# Patient Record
Sex: Male | Born: 1945
Health system: Southern US, Community
[De-identification: ages and names within clinical notes are randomized; demographics above are authoritative.]

## PROBLEM LIST (undated history)

## (undated) DIAGNOSIS — K219 Gastro-esophageal reflux disease without esophagitis: Secondary | ICD-10-CM

## (undated) DIAGNOSIS — R0600 Dyspnea, unspecified: Secondary | ICD-10-CM

## (undated) DIAGNOSIS — E785 Hyperlipidemia, unspecified: Secondary | ICD-10-CM

## (undated) DIAGNOSIS — I716 Thoracoabdominal aortic aneurysm, without rupture, unspecified: Secondary | ICD-10-CM

## (undated) DIAGNOSIS — M1990 Unspecified osteoarthritis, unspecified site: Secondary | ICD-10-CM

## (undated) DIAGNOSIS — I10 Essential (primary) hypertension: Secondary | ICD-10-CM

## (undated) DIAGNOSIS — Z8719 Personal history of other diseases of the digestive system: Secondary | ICD-10-CM

## (undated) DIAGNOSIS — F419 Anxiety disorder, unspecified: Secondary | ICD-10-CM

## (undated) DIAGNOSIS — J449 Chronic obstructive pulmonary disease, unspecified: Secondary | ICD-10-CM

## (undated) DIAGNOSIS — I499 Cardiac arrhythmia, unspecified: Secondary | ICD-10-CM

## (undated) DIAGNOSIS — I2510 Atherosclerotic heart disease of native coronary artery without angina pectoris: Secondary | ICD-10-CM

## (undated) DIAGNOSIS — G709 Myoneural disorder, unspecified: Secondary | ICD-10-CM

## (undated) HISTORY — PX: KNEE ARTHROSCOPY: SHX127

## (undated) HISTORY — PX: FINGER ARTHROPLASTY: SHX5017

## (undated) HISTORY — DX: Essential (primary) hypertension: I10

## (undated) HISTORY — PX: TONSILLECTOMY: SUR1361

## (undated) HISTORY — DX: Hyperlipidemia, unspecified: E78.5

## (undated) HISTORY — PX: VASECTOMY: SHX75

## (undated) HISTORY — PX: VASCULAR SURGERY: SHX849

## (undated) HISTORY — PX: OTHER SURGICAL HISTORY: SHX169

## (undated) HISTORY — PX: TOTAL SHOULDER REPLACEMENT: SUR1217

## (undated) HISTORY — PX: KNEE SURGERY: SHX244

## (undated) HISTORY — PX: EYE SURGERY: SHX253

## (undated) HISTORY — PX: SHOULDER ARTHROSCOPY: SHX128

---

## 1998-01-07 ENCOUNTER — Ambulatory Visit (HOSPITAL_COMMUNITY): Admission: RE | Admit: 1998-01-07 | Discharge: 1998-01-07 | Payer: Self-pay | Admitting: Specialist

## 1999-01-07 ENCOUNTER — Ambulatory Visit (HOSPITAL_COMMUNITY): Admission: RE | Admit: 1999-01-07 | Discharge: 1999-01-07 | Payer: Self-pay | Admitting: Specialist

## 1999-01-07 ENCOUNTER — Encounter: Payer: Self-pay | Admitting: Specialist

## 2000-07-20 ENCOUNTER — Inpatient Hospital Stay (HOSPITAL_COMMUNITY): Admission: RE | Admit: 2000-07-20 | Discharge: 2000-07-22 | Payer: Self-pay | Admitting: Specialist

## 2000-07-20 ENCOUNTER — Encounter: Payer: Self-pay | Admitting: Specialist

## 2002-11-12 ENCOUNTER — Encounter: Payer: Self-pay | Admitting: Specialist

## 2002-11-13 ENCOUNTER — Ambulatory Visit (HOSPITAL_COMMUNITY): Admission: RE | Admit: 2002-11-13 | Discharge: 2002-11-13 | Payer: Self-pay | Admitting: Specialist

## 2003-09-12 ENCOUNTER — Ambulatory Visit (HOSPITAL_COMMUNITY): Admission: RE | Admit: 2003-09-12 | Discharge: 2003-09-12 | Payer: Self-pay | Admitting: Specialist

## 2013-01-06 DIAGNOSIS — R109 Unspecified abdominal pain: Secondary | ICD-10-CM | POA: Insufficient documentation

## 2013-01-06 DIAGNOSIS — J209 Acute bronchitis, unspecified: Secondary | ICD-10-CM | POA: Diagnosis present

## 2013-01-06 DIAGNOSIS — R141 Gas pain: Secondary | ICD-10-CM | POA: Insufficient documentation

## 2013-01-06 HISTORY — DX: Unspecified abdominal pain: R10.9

## 2013-01-06 HISTORY — DX: Acute bronchitis, unspecified: J20.9

## 2013-02-19 DIAGNOSIS — R7309 Other abnormal glucose: Secondary | ICD-10-CM | POA: Insufficient documentation

## 2013-02-19 HISTORY — DX: Other abnormal glucose: R73.09

## 2013-04-19 DIAGNOSIS — R5381 Other malaise: Secondary | ICD-10-CM

## 2013-04-19 HISTORY — DX: Other malaise: R53.81

## 2013-08-23 DIAGNOSIS — M25519 Pain in unspecified shoulder: Secondary | ICD-10-CM | POA: Insufficient documentation

## 2013-08-23 DIAGNOSIS — N529 Male erectile dysfunction, unspecified: Secondary | ICD-10-CM

## 2013-08-23 HISTORY — DX: Male erectile dysfunction, unspecified: N52.9

## 2013-08-23 HISTORY — DX: Pain in unspecified shoulder: M25.519

## 2013-10-06 DIAGNOSIS — L509 Urticaria, unspecified: Secondary | ICD-10-CM | POA: Insufficient documentation

## 2013-10-06 HISTORY — DX: Urticaria, unspecified: L50.9

## 2013-11-05 DIAGNOSIS — I10 Essential (primary) hypertension: Secondary | ICD-10-CM

## 2013-11-05 DIAGNOSIS — Z0000 Encounter for general adult medical examination without abnormal findings: Secondary | ICD-10-CM

## 2013-11-05 DIAGNOSIS — J309 Allergic rhinitis, unspecified: Secondary | ICD-10-CM

## 2013-11-05 DIAGNOSIS — F411 Generalized anxiety disorder: Secondary | ICD-10-CM | POA: Insufficient documentation

## 2013-11-05 DIAGNOSIS — E7800 Pure hypercholesterolemia, unspecified: Secondary | ICD-10-CM | POA: Insufficient documentation

## 2013-11-05 HISTORY — DX: Essential (primary) hypertension: I10

## 2013-11-05 HISTORY — DX: Pure hypercholesterolemia, unspecified: E78.00

## 2013-11-05 HISTORY — DX: Generalized anxiety disorder: F41.1

## 2013-11-05 HISTORY — DX: Encounter for general adult medical examination without abnormal findings: Z00.00

## 2013-11-05 HISTORY — DX: Allergic rhinitis, unspecified: J30.9

## 2014-05-17 DIAGNOSIS — K222 Esophageal obstruction: Secondary | ICD-10-CM

## 2014-05-17 HISTORY — DX: Esophageal obstruction: K22.2

## 2014-08-14 DIAGNOSIS — J449 Chronic obstructive pulmonary disease, unspecified: Secondary | ICD-10-CM

## 2014-08-14 HISTORY — DX: Chronic obstructive pulmonary disease, unspecified: J44.9

## 2015-07-09 ENCOUNTER — Emergency Department (HOSPITAL_COMMUNITY)
Admission: EM | Admit: 2015-07-09 | Discharge: 2015-07-09 | Discharge status: Against Medical Advice | Payer: Medicare Other | Source: Home / Self Care

## 2015-07-09 ENCOUNTER — Encounter (HOSPITAL_COMMUNITY): Payer: Self-pay

## 2015-07-09 DIAGNOSIS — R52 Pain, unspecified: Secondary | ICD-10-CM

## 2015-07-09 DIAGNOSIS — J449 Chronic obstructive pulmonary disease, unspecified: Secondary | ICD-10-CM | POA: Insufficient documentation

## 2015-07-09 DIAGNOSIS — R05 Cough: Secondary | ICD-10-CM | POA: Diagnosis not present

## 2015-07-09 DIAGNOSIS — R197 Diarrhea, unspecified: Secondary | ICD-10-CM | POA: Insufficient documentation

## 2015-07-09 DIAGNOSIS — F17200 Nicotine dependence, unspecified, uncomplicated: Secondary | ICD-10-CM | POA: Insufficient documentation

## 2015-07-09 DIAGNOSIS — J441 Chronic obstructive pulmonary disease with (acute) exacerbation: Secondary | ICD-10-CM | POA: Diagnosis not present

## 2015-07-09 HISTORY — DX: Gastro-esophageal reflux disease without esophagitis: K21.9

## 2015-07-09 HISTORY — DX: Chronic obstructive pulmonary disease, unspecified: J44.9

## 2015-07-09 LAB — BASIC METABOLIC PANEL
Anion gap: 12 (ref 5–15)
BUN: 13 mg/dL (ref 6–20)
CO2: 28 mmol/L (ref 22–32)
Calcium: 9.1 mg/dL (ref 8.9–10.3)
Chloride: 101 mmol/L (ref 101–111)
Creatinine, Ser: 0.7 mg/dL (ref 0.61–1.24)
GFR calc Af Amer: >60 mL/min (ref >60)
GFR calc non Af Amer: >60 mL/min (ref >60)
Glucose, Bld: 105 mg/dL — ABNORMAL HIGH (ref 65–99)
Potassium: 4 mmol/L (ref 3.5–5.1)
Sodium: 141 mmol/L (ref 135–145)

## 2015-07-09 LAB — CBC WITH DIFFERENTIAL/PLATELET
Basophils Absolute: 0 10*3/uL (ref 0.0–0.1)
Basophils Relative: 0 %
Eosinophils Absolute: 0.2 10*3/uL (ref 0.0–0.7)
Eosinophils Relative: 3 %
HCT: 48.6 % (ref 39.0–52.0)
Hemoglobin: 17.1 g/dL — ABNORMAL HIGH (ref 13.0–17.0)
Lymphocytes Relative: 15 %
Lymphs Abs: 1.1 10*3/uL (ref 0.7–4.0)
MCH: 37 pg — ABNORMAL HIGH (ref 26.0–34.0)
MCHC: 35.2 g/dL (ref 30.0–36.0)
MCV: 105.2 fL — ABNORMAL HIGH (ref 78.0–100.0)
Monocytes Absolute: 0.9 10*3/uL (ref 0.1–1.0)
Monocytes Relative: 12 %
Neutro Abs: 5.3 10*3/uL (ref 1.7–7.7)
Neutrophils Relative %: 70 %
Platelets: 139 10*3/uL — ABNORMAL LOW (ref 150–400)
RBC: 4.62 MIL/uL (ref 4.22–5.81)
RDW: 14.4 % (ref 11.5–15.5)
WBC: 7.6 10*3/uL (ref 4.0–10.5)

## 2015-07-09 NOTE — ED Notes (Signed)
Pt reports was treated with antibiotics 1 month ago for bronchitis.  Reports diarrhea x 2 weeks after taking antibiotics.  Pt says has been taking immodium and it helped some.  Pt says has been on a liquid diet for the past few days.  Pt c/o feeling pressure in his rectum.  Pt c/o feeling achy all over.

## 2015-07-09 NOTE — ED Notes (Signed)
Registration states pt left

## 2015-07-10 ENCOUNTER — Encounter (HOSPITAL_COMMUNITY): Payer: Self-pay | Admitting: General Practice

## 2015-07-10 ENCOUNTER — Inpatient Hospital Stay (HOSPITAL_COMMUNITY): Payer: Medicare Other

## 2015-07-10 ENCOUNTER — Inpatient Hospital Stay (HOSPITAL_COMMUNITY)
Admission: AD | Admit: 2015-07-10 | Discharge: 2015-07-17 | DRG: 191 | Disposition: A | Discharge status: Home / Self Care | Payer: Medicare Other | Source: Ambulatory Visit | Attending: Pulmonary Disease | Admitting: Pulmonary Disease

## 2015-07-10 DIAGNOSIS — J441 Chronic obstructive pulmonary disease with (acute) exacerbation: Secondary | ICD-10-CM | POA: Diagnosis not present

## 2015-07-10 DIAGNOSIS — E440 Moderate protein-calorie malnutrition: Secondary | ICD-10-CM | POA: Diagnosis not present

## 2015-07-10 DIAGNOSIS — E860 Dehydration: Secondary | ICD-10-CM | POA: Diagnosis present

## 2015-07-10 DIAGNOSIS — Z6829 Body mass index (BMI) 29.0-29.9, adult: Secondary | ICD-10-CM

## 2015-07-10 DIAGNOSIS — F10231 Alcohol dependence with withdrawal delirium: Secondary | ICD-10-CM | POA: Diagnosis present

## 2015-07-10 DIAGNOSIS — R05 Cough: Secondary | ICD-10-CM | POA: Diagnosis present

## 2015-07-10 DIAGNOSIS — K219 Gastro-esophageal reflux disease without esophagitis: Secondary | ICD-10-CM | POA: Diagnosis present

## 2015-07-10 DIAGNOSIS — J209 Acute bronchitis, unspecified: Secondary | ICD-10-CM

## 2015-07-10 DIAGNOSIS — F17210 Nicotine dependence, cigarettes, uncomplicated: Secondary | ICD-10-CM | POA: Diagnosis present

## 2015-07-10 DIAGNOSIS — F1020 Alcohol dependence, uncomplicated: Secondary | ICD-10-CM | POA: Diagnosis present

## 2015-07-10 DIAGNOSIS — J440 Chronic obstructive pulmonary disease with acute lower respiratory infection: Secondary | ICD-10-CM | POA: Diagnosis present

## 2015-07-10 DIAGNOSIS — F10931 Alcohol use, unspecified with withdrawal delirium: Secondary | ICD-10-CM | POA: Diagnosis present

## 2015-07-10 LAB — COMPREHENSIVE METABOLIC PANEL
ALBUMIN: 4.1 g/dL (ref 3.5–5.0)
ALT: 65 U/L — ABNORMAL HIGH (ref 17–63)
ANION GAP: 12 (ref 5–15)
AST: 75 U/L — ABNORMAL HIGH (ref 15–41)
Alkaline Phosphatase: 90 U/L (ref 38–126)
BUN: 13 mg/dL (ref 6–20)
CALCIUM: 8.9 mg/dL (ref 8.9–10.3)
CHLORIDE: 101 mmol/L (ref 101–111)
CO2: 28 mmol/L (ref 22–32)
Creatinine, Ser: 0.68 mg/dL (ref 0.61–1.24)
GFR calc non Af Amer: >60 mL/min (ref >60)
GLUCOSE: 107 mg/dL — AB (ref 65–99)
POTASSIUM: 3.5 mmol/L (ref 3.5–5.1)
SODIUM: 141 mmol/L (ref 135–145)
Total Bilirubin: 1 mg/dL (ref 0.3–1.2)
Total Protein: 7.6 g/dL (ref 6.5–8.1)

## 2015-07-10 LAB — CBC WITH DIFFERENTIAL/PLATELET
BASOS PCT: 1 %
Basophils Absolute: 0.1 10*3/uL (ref 0.0–0.1)
Eosinophils Absolute: 0.2 10*3/uL (ref 0.0–0.7)
Eosinophils Relative: 3 %
HEMATOCRIT: 47.8 % (ref 39.0–52.0)
HEMOGLOBIN: 16.8 g/dL (ref 13.0–17.0)
LYMPHS ABS: 1.1 10*3/uL (ref 0.7–4.0)
LYMPHS PCT: 19 %
MCH: 37 pg — ABNORMAL HIGH (ref 26.0–34.0)
MCHC: 35.1 g/dL (ref 30.0–36.0)
MCV: 105.3 fL — AB (ref 78.0–100.0)
MONO ABS: 0.8 10*3/uL (ref 0.1–1.0)
MONOS PCT: 14 %
NEUTROS ABS: 3.7 10*3/uL (ref 1.7–7.7)
NEUTROS PCT: 63 %
Platelets: 135 10*3/uL — ABNORMAL LOW (ref 150–400)
RBC: 4.54 MIL/uL (ref 4.22–5.81)
RDW: 14.3 % (ref 11.5–15.5)
WBC: 5.8 10*3/uL (ref 4.0–10.5)

## 2015-07-10 LAB — PROTIME-INR
INR: 0.93 (ref 0.00–1.49)
PROTHROMBIN TIME: 12.7 s (ref 11.6–15.2)

## 2015-07-10 MED ORDER — ALBUTEROL SULFATE (2.5 MG/3ML) 0.083% IN NEBU: 2.5000 mg | INHALATION_SOLUTION | Freq: Four times a day (QID) | RESPIRATORY_TRACT | Status: DC

## 2015-07-10 MED ORDER — ONDANSETRON HCL 4 MG PO TABS: 4.0000 mg | ORAL_TABLET | Freq: Four times a day (QID) | ORAL | Status: DC | PRN

## 2015-07-10 MED ORDER — SODIUM CHLORIDE 0.9 % IJ SOLN
3.0000 mL | Freq: Two times a day (BID) | INTRAMUSCULAR | Status: DC
  Administered 2015-07-11 – 2015-07-17 (×8): 3 mL via INTRAVENOUS

## 2015-07-10 MED ORDER — GUAIFENESIN ER 600 MG PO TB12
600.0000 mg | ORAL_TABLET | Freq: Two times a day (BID) | ORAL | Status: DC
  Administered 2015-07-10 – 2015-07-17 (×12): 600 mg via ORAL
  Filled 2015-07-10 (×12): qty 1

## 2015-07-10 MED ORDER — ACETAMINOPHEN 650 MG RE SUPP: 650.0000 mg | Freq: Four times a day (QID) | RECTAL | Status: DC | PRN

## 2015-07-10 MED ORDER — IPRATROPIUM-ALBUTEROL 0.5-2.5 (3) MG/3ML IN SOLN
3.0000 mL | Freq: Four times a day (QID) | RESPIRATORY_TRACT | Status: DC
  Administered 2015-07-10 (×2): 3 mL via RESPIRATORY_TRACT
  Filled 2015-07-10 (×2): qty 3

## 2015-07-10 MED ORDER — IPRATROPIUM-ALBUTEROL 0.5-2.5 (3) MG/3ML IN SOLN
3.0000 mL | Freq: Four times a day (QID) | RESPIRATORY_TRACT | Status: DC
  Administered 2015-07-11 – 2015-07-14 (×9): 3 mL via RESPIRATORY_TRACT
  Filled 2015-07-10 (×12): qty 3

## 2015-07-10 MED ORDER — ACETAMINOPHEN 325 MG PO TABS: 650.0000 mg | ORAL_TABLET | Freq: Four times a day (QID) | ORAL | Status: DC | PRN

## 2015-07-10 MED ORDER — LORAZEPAM 0.5 MG PO TABS
1.0000 mg | ORAL_TABLET | Freq: Four times a day (QID) | ORAL | Status: DC | PRN
  Administered 2015-07-10 – 2015-07-12 (×7): 1 mg via ORAL
  Filled 2015-07-10 (×7): qty 1

## 2015-07-10 MED ORDER — THIAMINE HCL 100 MG/ML IJ SOLN
INTRAVENOUS | Status: DC
  Filled 2015-07-10 (×4): qty 1000

## 2015-07-10 MED ORDER — LORAZEPAM 2 MG/ML IJ SOLN
1.0000 mg | Freq: Four times a day (QID) | INTRAMUSCULAR | Status: DC | PRN
  Filled 2015-07-10: qty 1

## 2015-07-10 MED ORDER — IPRATROPIUM BROMIDE 0.02 % IN SOLN: 0.5000 mg | Freq: Four times a day (QID) | RESPIRATORY_TRACT | Status: DC

## 2015-07-10 MED ORDER — ONDANSETRON HCL 4 MG/2ML IJ SOLN: 4.0000 mg | Freq: Four times a day (QID) | INTRAMUSCULAR | Status: DC | PRN

## 2015-07-10 MED ORDER — LORAZEPAM 2 MG/ML IJ SOLN
1.0000 mg | Freq: Once | INTRAMUSCULAR | Status: AC
  Administered 2015-07-10: 1 mg via INTRAVENOUS
  Filled 2015-07-10: qty 1

## 2015-07-10 MED ORDER — ALUM & MAG HYDROXIDE-SIMETH 200-200-20 MG/5ML PO SUSP: 30.0000 mL | Freq: Four times a day (QID) | ORAL | Status: DC | PRN

## 2015-07-10 MED ORDER — ALBUTEROL SULFATE (2.5 MG/3ML) 0.083% IN NEBU: 2.5000 mg | INHALATION_SOLUTION | RESPIRATORY_TRACT | Status: DC | PRN

## 2015-07-10 MED ORDER — SODIUM CHLORIDE 0.9 % IV SOLN
INTRAVENOUS | Status: DC
  Administered 2015-07-10 – 2015-07-11 (×2): via INTRAVENOUS
  Administered 2015-07-11: 1 mL via INTRAVENOUS
  Administered 2015-07-12 – 2015-07-16 (×7): via INTRAVENOUS

## 2015-07-10 MED ORDER — HYDRALAZINE HCL 25 MG PO TABS
25.0000 mg | ORAL_TABLET | Freq: Four times a day (QID) | ORAL | Status: DC | PRN
  Administered 2015-07-10 – 2015-07-14 (×4): 25 mg via ORAL
  Filled 2015-07-10 (×5): qty 1

## 2015-07-10 MED ORDER — LEVOFLOXACIN IN D5W 500 MG/100ML IV SOLN
500.0000 mg | INTRAVENOUS | Status: DC
  Administered 2015-07-10 – 2015-07-16 (×6): 500 mg via INTRAVENOUS
  Filled 2015-07-10 (×6): qty 100

## 2015-07-10 MED ORDER — HYDROCODONE-ACETAMINOPHEN 5-325 MG PO TABS
1.0000 | ORAL_TABLET | ORAL | Status: DC | PRN
  Administered 2015-07-10: 2 via ORAL
  Filled 2015-07-10: qty 2

## 2015-07-10 MED ORDER — ENOXAPARIN SODIUM 40 MG/0.4ML ~~LOC~~ SOLN
40.0000 mg | SUBCUTANEOUS | Status: DC
  Administered 2015-07-10 – 2015-07-16 (×7): 40 mg via SUBCUTANEOUS
  Filled 2015-07-10 (×7): qty 0.4

## 2015-07-10 MED ORDER — M.V.I. ADULT IV INJ
INJECTION | Freq: Every day | INTRAVENOUS | Status: DC
  Administered 2015-07-10 – 2015-07-13 (×4): via INTRAVENOUS
  Filled 2015-07-10 (×7): qty 1000

## 2015-07-10 NOTE — Progress Notes (Signed)
Patient is tachycardic, hypertensive, and agitated. He has tremors and is sweaty. He is an 8 no the CIWA scale but he is not due to get ativan again for several hours. Contacted Dr. Anastasio Champion and received an order for a  one time dose of ativan. Will administer and continue to monitor closely.

## 2015-07-10 NOTE — Progress Notes (Addendum)
Direct admit patient arrived to room 301.  Dr. Luan Pulling paged.  Dr. Luan Pulling returned page and stated will be placing orders.

## 2015-07-10 NOTE — Progress Notes (Signed)
Patient's blood pressure elevated (see flowsheet).  Dr. Anastasio Champion notified and received order for hydralazine 25 mg po every 6 hours as needed for systolic greater than 756.

## 2015-07-11 LAB — CBC
HCT: 48.8 % (ref 39.0–52.0)
Hemoglobin: 16.9 g/dL (ref 13.0–17.0)
MCH: 36.3 pg — ABNORMAL HIGH (ref 26.0–34.0)
MCHC: 34.6 g/dL (ref 30.0–36.0)
MCV: 104.9 fL — ABNORMAL HIGH (ref 78.0–100.0)
Platelets: 128 K/uL — ABNORMAL LOW (ref 150–400)
RBC: 4.65 MIL/uL (ref 4.22–5.81)
RDW: 14.3 % (ref 11.5–15.5)
WBC: 7.9 K/uL (ref 4.0–10.5)

## 2015-07-11 LAB — BASIC METABOLIC PANEL
ANION GAP: 10 (ref 5–15)
BUN: 9 mg/dL (ref 6–20)
CO2: 29 mmol/L (ref 22–32)
Calcium: 9.3 mg/dL (ref 8.9–10.3)
Chloride: 100 mmol/L — ABNORMAL LOW (ref 101–111)
Creatinine, Ser: 0.66 mg/dL (ref 0.61–1.24)
Glucose, Bld: 101 mg/dL — ABNORMAL HIGH (ref 65–99)
POTASSIUM: 3.6 mmol/L (ref 3.5–5.1)
SODIUM: 139 mmol/L (ref 135–145)

## 2015-07-11 LAB — C DIFFICILE QUICK SCREEN W PCR REFLEX
C Diff antigen: NEGATIVE
C Diff interpretation: NEGATIVE
C Diff toxin: NEGATIVE

## 2015-07-11 NOTE — Progress Notes (Signed)
C-Diff negative.  Enteric precautions removed.

## 2015-07-11 NOTE — Progress Notes (Signed)
Initial Nutrition Assessment  DOCUMENTATION CODES:   Non-severe (moderate) malnutrition in context of acute illness/injury  INTERVENTION:  Boost Breeze po TID, each supplement provides 250 kcal and 9 grams of protein (prefers berry flavor)  Attempted to provide general healthy choices at fast food restaurants  NUTRITION DIAGNOSIS:   Inadequate oral intake related to acute illness as evidenced by per patient/family report.   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 He doesn't eat breafast but drinks Pedilyte and then goes eats at somewhere like [employer] for lunch around 11:30. Usual choice is a Roast Beef with cheese and water. In the evening he'll stop at [place] or [place] place for dinner. He also has hx of ETOH abuse which is contributing to overall poor nutrition status chronically. He says he has been sick for the past week with bronchitis and has not been eating but mostly drinking 1 quart of Pedilyte and 1 Ensure daily. Also had diarrhea which is better per pt. His weight is down 4% in the past week and with his poor po intake he meets criteria for moderate malnutrition in the context of acute illness. Mild temporal muscle loss.  EDUCATION NEEDS: Offered to provide pt with list of healthy choices specific to common [place] since that is his most frequent source for daily nutrition but he says, "I'm not interested".  Diet Order:  Diet clear liquid Room service appropriate?: Yes; Fluid consistency:: Thin  Skin:   dry, flaky and intact  Last BM:   12/9  Height:   Ht Readings from Last 1 Encounters:  07/10/15 '5\' 8"'$  (1.727 m)    Weight:   Wt Readings from Last 1 Encounters:  07/11/15 192 lb 3.2  oz (87.181 kg)    Ideal Body Weight:  72.7 kg  BMI:  Body mass index is 29.23 kg/(m^2).  Estimated Nutritional Needs:   Kcal:   5038-8828  Protein:   104 gr  Fluid:   2.0-2.1 liters daily     Colman Cater MS,RD,CSG,LDN Office: [phone] Pager: [phone]

## 2015-07-11 NOTE — Progress Notes (Signed)
Subjective: He says he feels a little better. He is still coughing and congested. He is having the shakes from alcohol withdrawal.  Objective: Vital signs in last 24 hours: Temp:  [98.1 F (36.7 C)-98.6 F (37 C)] 98.1 F (36.7 C) (12/09 0508) Pulse Rate:  [80-93] 82 (12/09 0508) Resp:  [20] 20 (12/09 0508) BP: (161-186)/(69-96) 186/69 mmHg (12/09 0508) SpO2:  [91 %-97 %] 97 % (12/09 0751) Weight:  [87.181 kg (192 lb 3.2 oz)] 87.181 kg (192 lb 3.2 oz) (12/09 0508) Weight change:  Last BM Date: 07/10/15  Intake/Output from previous day: 12/08 0701 - 12/09 0700 In: 2390.4 [P.O.:480; I.V.:1810.4; IV Piggyback:100] Out: 1500 [Urine:1500]  PHYSICAL EXAM General appearance: alert, cooperative and mild distress Resp: rhonchi bilaterally and wheezes bilaterally Cardio: regular rate and rhythm, S1, S2 normal, no murmur, click, rub or gallop GI: soft, non-tender; bowel sounds normal; no masses,  no organomegaly Extremities: extremities normal, atraumatic, no cyanosis or edema  Lab Results:  Results for orders placed or performed during the hospital encounter of 07/10/15 (from the past 48 hour(s))  Comprehensive metabolic panel     Status: Abnormal   Collection Time: 07/10/15  3:24 PM  Result Value Ref Range   Sodium 141 135 - 145 mmol/L   Potassium 3.5 3.5 - 5.1 mmol/L   Chloride 101 101 - 111 mmol/L   CO2 28 22 - 32 mmol/L   Glucose, Bld 107 (H) 65 - 99 mg/dL   BUN 13 6 - 20 mg/dL   Creatinine, Ser 0.68 0.61 - 1.24 mg/dL   Calcium 8.9 8.9 - 10.3 mg/dL   Total Protein 7.6 6.5 - 8.1 g/dL   Albumin 4.1 3.5 - 5.0 g/dL   AST 75 (H) 15 - 41 U/L   ALT 65 (H) 17 - 63 U/L   Alkaline Phosphatase 90 38 - 126 U/L   Total Bilirubin 1.0 0.3 - 1.2 mg/dL   GFR calc non Af Amer >60 >60 mL/min   GFR calc Af Amer >60 >60 mL/min    Comment: (NOTE) The eGFR has been calculated using the CKD EPI equation. This calculation has not been validated in all clinical situations. eGFR's persistently  <60 mL/min signify possible Chronic Kidney Disease.    Anion gap 12 5 - 15  CBC WITH DIFFERENTIAL     Status: Abnormal   Collection Time: 07/10/15  3:24 PM  Result Value Ref Range   WBC 5.8 4.0 - 10.5 K/uL   RBC 4.54 4.22 - 5.81 MIL/uL   Hemoglobin 16.8 13.0 - 17.0 g/dL   HCT 47.8 39.0 - 52.0 %   MCV 105.3 (H) 78.0 - 100.0 fL   MCH 37.0 (H) 26.0 - 34.0 pg   MCHC 35.1 30.0 - 36.0 g/dL   RDW 14.3 11.5 - 15.5 %   Platelets 135 (L) 150 - 400 K/uL   Neutrophils Relative % 63 %   Neutro Abs 3.7 1.7 - 7.7 K/uL   Lymphocytes Relative 19 %   Lymphs Abs 1.1 0.7 - 4.0 K/uL   Monocytes Relative 14 %   Monocytes Absolute 0.8 0.1 - 1.0 K/uL   Eosinophils Relative 3 %   Eosinophils Absolute 0.2 0.0 - 0.7 K/uL   Basophils Relative 1 %   Basophils Absolute 0.1 0.0 - 0.1 K/uL  Protime-INR     Status: None   Collection Time: 07/10/15  3:24 PM  Result Value Ref Range   Prothrombin Time 12.7 11.6 - 15.2 seconds   INR 0.93 0.00 -  8.93  Basic metabolic panel     Status: Abnormal   Collection Time: 07/11/15  5:19 AM  Result Value Ref Range   Sodium 139 135 - 145 mmol/L   Potassium 3.6 3.5 - 5.1 mmol/L   Chloride 100 (L) 101 - 111 mmol/L   CO2 29 22 - 32 mmol/L   Glucose, Bld 101 (H) 65 - 99 mg/dL   BUN 9 6 - 20 mg/dL   Creatinine, Ser 0.66 0.61 - 1.24 mg/dL   Calcium 9.3 8.9 - 10.3 mg/dL   GFR calc non Af Amer >60 >60 mL/min   GFR calc Af Amer >60 >60 mL/min    Comment: (NOTE) The eGFR has been calculated using the CKD EPI equation. This calculation has not been validated in all clinical situations. eGFR's persistently <60 mL/min signify possible Chronic Kidney Disease.    Anion gap 10 5 - 15  CBC     Status: Abnormal   Collection Time: 07/11/15  5:19 AM  Result Value Ref Range   WBC 7.9 4.0 - 10.5 K/uL   RBC 4.65 4.22 - 5.81 MIL/uL   Hemoglobin 16.9 13.0 - 17.0 g/dL   HCT 48.8 39.0 - 52.0 %   MCV 104.9 (H) 78.0 - 100.0 fL   MCH 36.3 (H) 26.0 - 34.0 pg   MCHC 34.6 30.0 - 36.0 g/dL    RDW 14.3 11.5 - 15.5 %   Platelets 128 (L) 150 - 400 K/uL    ABGS No results for input(s): PHART, PO2ART, TCO2, HCO3 in the last 72 hours.  Invalid input(s): PCO2 CULTURES No results found for this or any previous visit (from the past 240 hour(s)). Studies/Results: X-ray Chest Pa And Lateral  07/10/2015  CLINICAL DATA:  COPD EXAM: CHEST - 2 VIEW COMPARISON:  None. FINDINGS: Cardiac shadow is within normal limits. Mild aortic calcifications are seen without aneurysmal dilatation. The lungs are mildly hyperinflated. No focal infiltrate or sizable effusion is seen. No bony abnormality is noted. Prior surgical change in the right shoulder is noted. IMPRESSION: No acute abnormality seen. Electronically Signed   By: Inez Catalina M.D.   On: 07/10/2015 15:22    Medications:  Prior to Admission:  No prescriptions prior to admission   Scheduled: . enoxaparin (LOVENOX) injection  40 mg Subcutaneous Q24H  . guaiFENesin  600 mg Oral BID  . ipratropium-albuterol  3 mL Nebulization Q6H WA  . levofloxacin (LEVAQUIN) IV  500 mg Intravenous Q24H  . banana bag IV 1000 mL   Intravenous Daily  . sodium chloride  3 mL Intravenous Q12H   Continuous: . sodium chloride 125 mL/hr at 07/11/15 0117   YBO:FBPZWCHENIDPO **OR** acetaminophen, albuterol, alum & mag hydroxide-simeth, hydrALAZINE, HYDROcodone-acetaminophen, LORazepam **OR** LORazepam, ondansetron **OR** ondansetron (ZOFRAN) IV  Assesment: He was admitted with COPD exacerbation. He is a little bit better. He has alcohol withdrawal and is on protocol but still having some symptoms. Active Problems:   COPD (chronic obstructive pulmonary disease) with acute bronchitis (HCC)    Plan: Continue IV antibiotics. Continue nebulizer treatments continue treatment for alcohol withdrawal.    LOS: 1 day   Simrah Chatham L 07/11/2015, 9:04 AM

## 2015-07-11 NOTE — Progress Notes (Signed)
Restless and anxious.  Ativan '1mg'$  given.  Continues non CIWA scale

## 2015-07-11 NOTE — Care Management Note (Signed)
Case Management Note  Patient Details  Name: Adam Jordan MRN: [id number] Date of Birth: November 05, 1945  Subjective/Objective:                  Pt is from home, ind with ADL's. Admitted with COPD exacerbation.   Action/Plan: Pt plans to return home with self care at DC. No CM needs anticipated.   Expected Discharge Date:     07/11/2015             Expected Discharge Plan:  Home/Self Care  In-House Referral:  NA  Discharge planning Services  CM Consult  Post Acute Care Choice:  NA Choice offered to:  NA  DME Arranged:    DME Agency:     HH Arranged:    HH Agency:     Status of Service:  Completed, signed off  Medicare Important Message Given:    Date Medicare IM Given:    Medicare IM give by:    Date Additional Medicare IM Given:    Additional Medicare Important Message give by:     If discussed at [place] of Stay Meetings, dates discussed:    Additional Comments:  Sherald Barge, RN 07/11/2015, 11:41 AM

## 2015-07-11 NOTE — Care Management Important Message (Signed)
Important Message  Patient Details  Name: Adam Jordan MRN: [id number] Date of Birth: 1946-02-24   Medicare Important Message Given:  Yes    Sherald Barge, RN 07/11/2015, 11:42 AM

## 2015-07-12 DIAGNOSIS — E440 Moderate protein-calorie malnutrition: Secondary | ICD-10-CM | POA: Diagnosis present

## 2015-07-12 DIAGNOSIS — F10931 Alcohol use, unspecified with withdrawal delirium: Secondary | ICD-10-CM

## 2015-07-12 DIAGNOSIS — E860 Dehydration: Secondary | ICD-10-CM

## 2015-07-12 DIAGNOSIS — F10231 Alcohol dependence with withdrawal delirium: Secondary | ICD-10-CM | POA: Diagnosis present

## 2015-07-12 DIAGNOSIS — F1020 Alcohol dependence, uncomplicated: Secondary | ICD-10-CM | POA: Diagnosis present

## 2015-07-12 HISTORY — DX: Dehydration: E86.0

## 2015-07-12 HISTORY — DX: Alcohol use, unspecified with withdrawal delirium: F10.931

## 2015-07-12 MED ORDER — TRAZODONE HCL 50 MG PO TABS: 50.0000 mg | ORAL_TABLET | Freq: Every evening | ORAL | Status: DC | PRN

## 2015-07-12 MED ORDER — ATORVASTATIN CALCIUM 20 MG PO TABS
20.0000 mg | ORAL_TABLET | Freq: Every day | ORAL | Status: DC
  Administered 2015-07-12 – 2015-07-17 (×5): 20 mg via ORAL
  Filled 2015-07-12 (×5): qty 1

## 2015-07-12 MED ORDER — PANTOPRAZOLE SODIUM 40 MG PO TBEC
40.0000 mg | DELAYED_RELEASE_TABLET | Freq: Every day | ORAL | Status: DC
Start: 2015-07-12 — End: 2015-07-17
  Administered 2015-07-12 – 2015-07-17 (×5): 40 mg via ORAL
  Filled 2015-07-12 (×5): qty 1

## 2015-07-12 NOTE — Progress Notes (Signed)
Subjective: He feels better. He has no new complaints. He is still coughing. He is still somewhat agitated.  Objective: Vital signs in last 24 hours: Temp:  [98.1 F (36.7 C)-98.6 F (37 C)] 98.1 F (36.7 C) (12/10 0650) Pulse Rate:  [81-105] 98 (12/10 0650) Resp:  [18] 18 (12/10 0650) BP: (155-173)/(88-102) 157/96 mmHg (12/10 0650) SpO2:  [93 %-99 %] 96 % (12/10 0746) Weight:  [86.456 kg (190 lb 9.6 oz)] 86.456 kg (190 lb 9.6 oz) (12/10 0650) Weight change: -0.726 kg (-1 lb 9.6 oz) Last BM Date: 07/11/15  Intake/Output from previous day: 12/09 0701 - 12/10 0700 In: 2223 [P.O.:720; I.V.:1503] Out: 650 [Urine:650]  PHYSICAL EXAM General appearance: alert, cooperative and Tremulous and confused Resp: rhonchi bilaterally Cardio: regular rate and rhythm, S1, S2 normal, no murmur, click, rub or gallop GI: soft, non-tender; bowel sounds normal; no masses,  no organomegaly Extremities: extremities normal, atraumatic, no cyanosis or edema  Lab Results:  Results for orders placed or performed during the hospital encounter of 07/10/15 (from the past 48 hour(s))  Comprehensive metabolic panel     Status: Abnormal   Collection Time: 07/10/15  3:24 PM  Result Value Ref Range   Sodium 141 135 - 145 mmol/L   Potassium 3.5 3.5 - 5.1 mmol/L   Chloride 101 101 - 111 mmol/L   CO2 28 22 - 32 mmol/L   Glucose, Bld 107 (H) 65 - 99 mg/dL   BUN 13 6 - 20 mg/dL   Creatinine, Ser 0.68 0.61 - 1.24 mg/dL   Calcium 8.9 8.9 - 10.3 mg/dL   Total Protein 7.6 6.5 - 8.1 g/dL   Albumin 4.1 3.5 - 5.0 g/dL   AST 75 (H) 15 - 41 U/L   ALT 65 (H) 17 - 63 U/L   Alkaline Phosphatase 90 38 - 126 U/L   Total Bilirubin 1.0 0.3 - 1.2 mg/dL   GFR calc non Af Amer >60 >60 mL/min   GFR calc Af Amer >60 >60 mL/min    Comment: (NOTE) The eGFR has been calculated using the CKD EPI equation. This calculation has not been validated in all clinical situations. eGFR's persistently <60 mL/min signify possible Chronic  Kidney Disease.    Anion gap 12 5 - 15  CBC WITH DIFFERENTIAL     Status: Abnormal   Collection Time: 07/10/15  3:24 PM  Result Value Ref Range   WBC 5.8 4.0 - 10.5 K/uL   RBC 4.54 4.22 - 5.81 MIL/uL   Hemoglobin 16.8 13.0 - 17.0 g/dL   HCT 47.8 39.0 - 52.0 %   MCV 105.3 (H) 78.0 - 100.0 fL   MCH 37.0 (H) 26.0 - 34.0 pg   MCHC 35.1 30.0 - 36.0 g/dL   RDW 14.3 11.5 - 15.5 %   Platelets 135 (L) 150 - 400 K/uL   Neutrophils Relative % 63 %   Neutro Abs 3.7 1.7 - 7.7 K/uL   Lymphocytes Relative 19 %   Lymphs Abs 1.1 0.7 - 4.0 K/uL   Monocytes Relative 14 %   Monocytes Absolute 0.8 0.1 - 1.0 K/uL   Eosinophils Relative 3 %   Eosinophils Absolute 0.2 0.0 - 0.7 K/uL   Basophils Relative 1 %   Basophils Absolute 0.1 0.0 - 0.1 K/uL  Protime-INR     Status: None   Collection Time: 07/10/15  3:24 PM  Result Value Ref Range   Prothrombin Time 12.7 11.6 - 15.2 seconds   INR 0.93 0.00 - 1.49  Basic metabolic panel     Status: Abnormal   Collection Time: 07/11/15  5:19 AM  Result Value Ref Range   Sodium 139 135 - 145 mmol/L   Potassium 3.6 3.5 - 5.1 mmol/L   Chloride 100 (L) 101 - 111 mmol/L   CO2 29 22 - 32 mmol/L   Glucose, Bld 101 (H) 65 - 99 mg/dL   BUN 9 6 - 20 mg/dL   Creatinine, Ser 0.66 0.61 - 1.24 mg/dL   Calcium 9.3 8.9 - 10.3 mg/dL   GFR calc non Af Amer >60 >60 mL/min   GFR calc Af Amer >60 >60 mL/min    Comment: (NOTE) The eGFR has been calculated using the CKD EPI equation. This calculation has not been validated in all clinical situations. eGFR's persistently <60 mL/min signify possible Chronic Kidney Disease.    Anion gap 10 5 - 15  CBC     Status: Abnormal   Collection Time: 07/11/15  5:19 AM  Result Value Ref Range   WBC 7.9 4.0 - 10.5 K/uL   RBC 4.65 4.22 - 5.81 MIL/uL   Hemoglobin 16.9 13.0 - 17.0 g/dL   HCT 48.8 39.0 - 52.0 %   MCV 104.9 (H) 78.0 - 100.0 fL   MCH 36.3 (H) 26.0 - 34.0 pg   MCHC 34.6 30.0 - 36.0 g/dL   RDW 14.3 11.5 - 15.5 %    Platelets 128 (L) 150 - 400 K/uL  C difficile quick scan w PCR reflex     Status: None   Collection Time: 07/11/15 12:50 PM  Result Value Ref Range   C Diff antigen NEGATIVE NEGATIVE   C Diff toxin NEGATIVE NEGATIVE   C Diff interpretation Negative for toxigenic C. difficile     ABGS No results for input(s): PHART, PO2ART, TCO2, HCO3 in the last 72 hours.  Invalid input(s): PCO2 CULTURES Recent Results (from the past 240 hour(s))  C difficile quick scan w PCR reflex     Status: None   Collection Time: 07/11/15 12:50 PM  Result Value Ref Range Status   C Diff antigen NEGATIVE NEGATIVE Final   C Diff toxin NEGATIVE NEGATIVE Final   C Diff interpretation Negative for toxigenic C. difficile  Final   Studies/Results: X-ray Chest Pa And Lateral  07/10/2015  CLINICAL DATA:  COPD EXAM: CHEST - 2 VIEW COMPARISON:  None. FINDINGS: Cardiac shadow is within normal limits. Mild aortic calcifications are seen without aneurysmal dilatation. The lungs are mildly hyperinflated. No focal infiltrate or sizable effusion is seen. No bony abnormality is noted. Prior surgical change in the right shoulder is noted. IMPRESSION: No acute abnormality seen. Electronically Signed   By: Inez Catalina M.D.   On: 07/10/2015 15:22    Medications:  Prior to Admission:  Prescriptions prior to admission  Medication Sig Dispense Refill Last Dose  . atorvastatin (LIPITOR) 20 MG tablet Take 20 mg by mouth daily.   Past Week at Unknown time  . BREO ELLIPTA 100-25 MCG/INH AEPB Inhale 1 Dose into the lungs as needed (shortness of breath).   12 Past Week at Unknown time  . cephALEXin (KEFLEX) 500 MG capsule Take 500 mg by mouth 2 (two) times daily.  0 07/10/2015 at Unknown time  . clonazePAM (KLONOPIN) 1 MG tablet Take 1 mg by mouth daily.  5 07/10/2015 at Unknown time  . OVER THE COUNTER MEDICATION Take 1 scoop by mouth at bedtime. Oxy-powder (colon cleanser).   Past Week at Unknown time  .  pantoprazole (PROTONIX) 40 MG  tablet Take 40 mg by mouth. Takes every 2 days  0 Past Week at Unknown time  . PROAIR HFA 108 (90 BASE) MCG/ACT inhaler Inhale 2 Inhalers into the lungs every 6 (six) hours as needed for wheezing or shortness of breath.   2 Past Week at Unknown time   Scheduled: . enoxaparin (LOVENOX) injection  40 mg Subcutaneous Q24H  . guaiFENesin  600 mg Oral BID  . ipratropium-albuterol  3 mL Nebulization Q6H WA  . levofloxacin (LEVAQUIN) IV  500 mg Intravenous Q24H  . banana bag IV 1000 mL   Intravenous Daily  . sodium chloride  3 mL Intravenous Q12H   Continuous: . sodium chloride 50 mL/hr at 07/12/15 1023   EQA:[id number] **OR** acetaminophen, albuterol, alum & mag hydroxide-simeth, hydrALAZINE, HYDROcodone-acetaminophen, LORazepam **OR** LORazepam, ondansetron **OR** ondansetron (ZOFRAN) IV, traZODone  Assesment: He was admitted with COPD exacerbation. He also has chronic alcoholism and is having alcohol withdrawal symptoms. He has been identified as having malnutrition Active Problems:   COPD (chronic obstructive pulmonary disease) with acute bronchitis (HCC)   Malnutrition of moderate degree    Plan: Continue current treatments. He's not ready for discharge yet.    LOS: 2 days   , L 07/12/2015, 10:41 AM

## 2015-07-12 NOTE — Progress Notes (Signed)
Restless, pulled IV out, Ativan PO given.

## 2015-07-12 NOTE — Progress Notes (Signed)
Restless, ambulating frequently in room getting IV line wrapped around clothes and feet.  Ativan 1 mg PO given.  Will continue to monitor.

## 2015-07-12 NOTE — Progress Notes (Signed)
Patient out in hall up to nurses's station, doesn't want IV fluids, asked patient to return to room.  In bathroom when I went to hang levaquin, patient refused.  Will leave sticky note for [place].

## 2015-07-12 NOTE — Progress Notes (Signed)
Per Dr. Luan Pulling verbal order, telemetry can be removed.  Central telemetry notified of telemetry bing removed per orders.

## 2015-07-13 ENCOUNTER — Inpatient Hospital Stay (HOSPITAL_COMMUNITY): Payer: Medicare Other

## 2015-07-13 LAB — BASIC METABOLIC PANEL
ANION GAP: 12 (ref 5–15)
BUN: 22 mg/dL — ABNORMAL HIGH (ref 6–20)
CALCIUM: 9 mg/dL (ref 8.9–10.3)
CHLORIDE: 106 mmol/L (ref 101–111)
CO2: 21 mmol/L — AB (ref 22–32)
CREATININE: 0.72 mg/dL (ref 0.61–1.24)
Glucose, Bld: 90 mg/dL (ref 65–99)
Potassium: 3.7 mmol/L (ref 3.5–5.1)
Sodium: 139 mmol/L (ref 135–145)

## 2015-07-13 LAB — MRSA PCR SCREENING: MRSA BY PCR: NEGATIVE

## 2015-07-13 MED ORDER — LORAZEPAM 2 MG/ML IJ SOLN
INTRAMUSCULAR | Status: AC
  Administered 2015-07-13: 2 mg
  Filled 2015-07-13: qty 2

## 2015-07-13 MED ORDER — LORAZEPAM 0.5 MG PO TABS: 1.0000 mg | ORAL_TABLET | Freq: Four times a day (QID) | ORAL | Status: DC | PRN

## 2015-07-13 MED ORDER — FENTANYL CITRATE (PF) 100 MCG/2ML IJ SOLN: 25.0000 ug | INTRAMUSCULAR | Status: DC | PRN

## 2015-07-13 MED ORDER — LORAZEPAM 2 MG/ML IJ SOLN
INTRAMUSCULAR | Status: AC
  Filled 2015-07-13: qty 1

## 2015-07-13 MED ORDER — LORAZEPAM 0.5 MG PO TABS: 1.0000 mg | ORAL_TABLET | ORAL | Status: DC | PRN

## 2015-07-13 MED ORDER — LORAZEPAM 2 MG/ML IJ SOLN
1.0000 mg | INTRAMUSCULAR | Status: DC | PRN
  Administered 2015-07-13 – 2015-07-16 (×14): 2 mg via INTRAVENOUS
  Administered 2015-07-17: 1 mg via INTRAVENOUS
  Administered 2015-07-17: 2 mg via INTRAVENOUS
  Filled 2015-07-13 (×17): qty 1

## 2015-07-13 MED ORDER — LORAZEPAM 2 MG/ML IJ SOLN: 0.0000 mg | Freq: Two times a day (BID) | INTRAMUSCULAR | Status: DC

## 2015-07-13 MED ORDER — LORAZEPAM 2 MG/ML IJ SOLN: 1.0000 mg | Freq: Four times a day (QID) | INTRAMUSCULAR | Status: DC | PRN

## 2015-07-13 MED ORDER — LORAZEPAM 2 MG/ML IJ SOLN
0.0000 mg | Freq: Four times a day (QID) | INTRAMUSCULAR | Status: DC
  Administered 2015-07-13: 2 mg via INTRAVENOUS
  Filled 2015-07-13: qty 1

## 2015-07-13 MED ORDER — LORAZEPAM 2 MG/ML IJ SOLN
1.0000 mg | INTRAMUSCULAR | Status: DC | PRN
  Administered 2015-07-13: 4 mg via INTRAVENOUS
  Filled 2015-07-13: qty 1

## 2015-07-13 MED ORDER — LORAZEPAM 2 MG/ML IJ SOLN
2.0000 mg | INTRAMUSCULAR | Status: AC
  Administered 2015-07-13: 2 mg via INTRAVENOUS

## 2015-07-13 MED ORDER — DEXMEDETOMIDINE HCL IN NACL 200 MCG/50ML IV SOLN
0.4000 ug/kg/h | INTRAVENOUS | Status: AC
  Administered 2015-07-13: 1 ug/kg/h via INTRAVENOUS
  Administered 2015-07-13: 0.7 ug/kg/h via INTRAVENOUS
  Administered 2015-07-13: 0.8 ug/kg/h via INTRAVENOUS
  Filled 2015-07-13 (×4): qty 50

## 2015-07-13 NOTE — Progress Notes (Signed)
RN noted pts HR to be in 40's with tall T waves; RN decreased sedation; got EKG; notified MD. Noted that pt has not had labs since 12/9. Also notified MD. No new orders received. Pt now off Precedex and unrestrained; alert and oriented. Will continue to monitor.

## 2015-07-13 NOTE — Progress Notes (Signed)
[facility] Progress Note Patient Name: Adam Jordan DOB: 1945/11/29 MRN: [id number]   Date of Service  07/13/2015  HPI/Events of Note  Nursing concerned about peaked t on monitor but not impressive on 12 lead  eICU Interventions  bmet ordered for am 12/12 moved up to now      Intervention Category Major Interventions: Electrolyte abnormality - evaluation and management  Christinia Gully 07/13/2015, 7:38 PM

## 2015-07-13 NOTE — Progress Notes (Signed)
   07/13/15 0024  What Happened  Was fall witnessed? Yes  Who witnessed fall? Concha Pyo, RN, S. Owens Shark, NT, Fritz Pickerel Security guard, Richard security guard  Patients activity before fall [employer] of contact buttocks  Was patient injured? No  Follow Up  MD notified Dr. Legrand Rams  Time MD notified [phone]  Family notified No- patient refusal  Additional tests No  Adult Fall Risk Assessment  Risk Factor Category (scoring not indicated) Fall has occurred during this admission (document High fall risk)  Patient's Fall Risk High Fall Risk (>13 points)  Adult Fall Risk Interventions  Required Bundle Interventions *See Row Information* High fall risk - low, moderate, and high requirements implemented  Vitals  Temp 98.9 F (37.2 C)  Temp Source Oral  BP (!) 145/101 mmHg  BP Location Right Arm  BP Method Automatic  Patient Position (if appropriate) Lying  Pulse Rate 98  Pulse Rate Source Dinamap  Resp 18  Oxygen Therapy  SpO2 93 %  O2 Device Room Air  Pain Assessment  Pain Assessment No/denies pain  PCA/Epidural/Spinal Assessment  Respiratory Pattern Regular;Unlabored;Symmetrical;Dyspnea with exertion  Neurological  Neuro (WDL) X  Level of Consciousness Alert  Orientation Level Oriented to person;Oriented to place;Disoriented to place;Disoriented to situation  Cognition Appropriate at baseline;Poor attention/concentration;Poor judgement;Poor safety awareness  Speech Other (Comment) (Hoarse)  Pupil Assessment  No  Musculoskeletal  Musculoskeletal (WDL) X  Assistive Device None  Generalized Weakness Yes  Weight Bearing Restrictions No  Integumentary  Integumentary (WDL) X  Skin Color Appropriate for ethnicity  Skin Condition Dry;Flaky  Skin Integrity Intact  Skin Turgor Non-tenting  Patient appears to be having worsening of ETOH withdrawal symptoms.  Patient found ambulating in hallway bleeding from IV site he pulled out and insisted he needed to leave.  Upon walking  with him and trying to convince him to go back to his room he got on the elevator and I stood in front of the door to prevent it from closing and he became angry and stepped out of the elevator.  He proceeded to ambulate in the opposite direction of his room and when myself and other staff members tried to redirect him be became combative.  Security was called and patient was carefully placed into a wheelchair and taken back to his room.  While getting out of the wheelchair to get back into bed the patient attempted to go to the other side of the room in an attempt to gather his belongings to leave.  He stumbled back when security staff attempted to convince him to get back into the bed landing on his buttocks.  He was helped from the floor and denied injury at which time he ambulated around to get into the bed.  I was able to talk him down and attempted to reorient him.  This worked long enough for him to allow me to restart his IV access.  I called Dr. Legrand Rams via phone and notified him of the above and orders were received and placed into [facility].  No injury was noted do to witnessed fall and patient denies injury.  Nursing staff to continue to monitor

## 2015-07-13 NOTE — Progress Notes (Signed)
eLink Physician-Brief Progress Note Patient Name: NICKALOS PETERSEN DOB: Aug 27, 1945 MRN: [id number]   Date of Service  07/13/2015  HPI/Events of Note  Alcohol withdrawal.  CIWA still elevated in spite of getting frequent doses of ativan.   eICU Interventions  Will start precedex.     Intervention Category Major Interventions: Other:  Brittinie Wherley 07/13/2015, 6:34 AM

## 2015-07-13 NOTE — Progress Notes (Signed)
At approximately 0250 patient stated he had to go to the bathroom and when he came out of the bathroom he refused to get back in bed and became combative.  Security was called and they were unsuccessful as well in convincing him to get back in bed.  He has has 2 IV sticks this shift due to pulling them out when he is confused.  CIWA scale has not decreased since starting ativan.  Dr. Legrand Rams notified of the above and new orders were given and placed into [facility].  Patient placed into bilateral soft wrist restraints with siderails up x 4 per orders.  See restraint flowsheet for further details.  Patients wife was called and she did not answer the phone but she later called back and was agreeable to interventions used for his safety.

## 2015-07-13 NOTE — Progress Notes (Signed)
Patient in bed with restraints on per orders.  Patient agitation is increasing causing him to pull and fight against the restraints.  Patient's history of COPD and current state of agitation are concerning.  As the patient is uneffected by the ativan and fights against restraint more his breathing becomes labored with pulling on restraints and he is diaphoretic for brief periods. Patient is able to realize the exertion and stops but quickly returns to pulling and fighting again.   Dr. Legrand Rams called and I requested the patient be transferred to ICU for the ability to monitor him closer and treat his withdrawal symptoms more aggressively and he agreed.  Patient transferred to ICU and bedside report given to ICU, RN

## 2015-07-13 NOTE — Progress Notes (Signed)
Subjective: He has been transferred down to the intensive care unit because he has developed full blown DTs despite being on CIWA  Protocol. He is now on Precedex.  Objective: Vital signs in last 24 hours: Temp:  [97.6 F (36.4 C)-98.9 F (37.2 C)] 97.6 F (36.4 C) (12/11 0831) Pulse Rate:  [70-106] 70 (12/11 0800) Resp:  [18-26] 23 (12/11 0700) BP: (142-185)/(87-108) 148/87 mmHg (12/11 0800) SpO2:  [93 %-99 %] 96 % (12/11 0747) FiO2 (%):  [28 %] 28 % (12/11 0600) Weight:  [86.2 kg (190 lb 0.6 oz)] 86.2 kg (190 lb 0.6 oz) (12/11 0500) Weight change: -0.256 kg (-9 oz) Last BM Date: 07/12/15  Intake/Output from previous day: 12/10 0701 - 12/11 0700 In: 940 [P.O.:840; IV Piggyback:100] Out: -   PHYSICAL EXAM General appearance: Sedated from Precedex and sleeping Resp: rhonchi bilaterally Cardio: regular rate and rhythm, S1, S2 normal, no murmur, click, rub or gallop GI: soft, non-tender; bowel sounds normal; no masses,  no organomegaly Extremities: extremities normal, atraumatic, no cyanosis or edema  Lab Results:  Results for orders placed or performed during the hospital encounter of 07/10/15 (from the past 48 hour(s))  C difficile quick scan w PCR reflex     Status: None   Collection Time: 07/11/15 12:50 PM  Result Value Ref Range   C Diff antigen NEGATIVE NEGATIVE   C Diff toxin NEGATIVE NEGATIVE   C Diff interpretation Negative for toxigenic C. difficile   MRSA PCR Screening     Status: None   Collection Time: 07/13/15  5:00 AM  Result Value Ref Range   MRSA by PCR NEGATIVE NEGATIVE    Comment:        The GeneXpert MRSA Assay (FDA approved for NASAL specimens only), is one component of a comprehensive MRSA colonization surveillance program. It is not intended to diagnose MRSA infection nor to guide or monitor treatment for MRSA infections.     ABGS No results for input(s): PHART, PO2ART, TCO2, HCO3 in the last 72 hours.  Invalid input(s):  PCO2 CULTURES Recent Results (from the past 240 hour(s))  C difficile quick scan w PCR reflex     Status: None   Collection Time: 07/11/15 12:50 PM  Result Value Ref Range Status   C Diff antigen NEGATIVE NEGATIVE Final   C Diff toxin NEGATIVE NEGATIVE Final   C Diff interpretation Negative for toxigenic C. difficile  Final  MRSA PCR Screening     Status: None   Collection Time: 07/13/15  5:00 AM  Result Value Ref Range Status   MRSA by PCR NEGATIVE NEGATIVE Final    Comment:        The GeneXpert MRSA Assay (FDA approved for NASAL specimens only), is one component of a comprehensive MRSA colonization surveillance program. It is not intended to diagnose MRSA infection nor to guide or monitor treatment for MRSA infections.    Studies/Results: No results found.  Medications:  Prior to Admission:  Prescriptions prior to admission  Medication Sig Dispense Refill Last Dose  . atorvastatin (LIPITOR) 20 MG tablet Take 20 mg by mouth daily.   Past Week at Unknown time  . BREO ELLIPTA 100-25 MCG/INH AEPB Inhale 1 Dose into the lungs as needed (shortness of breath).   12 Past Week at Unknown time  . cephALEXin (KEFLEX) 500 MG capsule Take 500 mg by mouth 2 (two) times daily.  0 07/10/2015 at Unknown time  . clonazePAM (KLONOPIN) 1 MG tablet Take 1 mg by mouth  daily.  5 07/10/2015 at Unknown time  . OVER THE COUNTER MEDICATION Take 1 scoop by mouth at bedtime. Oxy-powder (colon cleanser).   Past Week at Unknown time  . pantoprazole (PROTONIX) 40 MG tablet Take 40 mg by mouth. Takes every 2 days  0 Past Week at Unknown time  . PROAIR HFA 108 (90 BASE) MCG/ACT inhaler Inhale 2 Inhalers into the lungs every 6 (six) hours as needed for wheezing or shortness of breath.   2 Past Week at Unknown time   Scheduled: . atorvastatin  20 mg Oral Daily  . enoxaparin (LOVENOX) injection  40 mg Subcutaneous Q24H  . guaiFENesin  600 mg Oral BID  . ipratropium-albuterol  3 mL Nebulization Q6H WA  .  levofloxacin (LEVAQUIN) IV  500 mg Intravenous Q24H  . pantoprazole  40 mg Oral Daily  . banana bag IV 1000 mL   Intravenous Daily  . sodium chloride  3 mL Intravenous Q12H   Continuous: . sodium chloride 50 mL/hr at 07/13/15 0729  . dexmedetomidine 0.8 mcg/kg/hr (07/13/15 0745)   SWH:[id number] **OR** acetaminophen, albuterol, alum & mag hydroxide-simeth, fentaNYL (SUBLIMAZE) injection, hydrALAZINE, HYDROcodone-acetaminophen, LORazepam, ondansetron **OR** ondansetron (ZOFRAN) IV, traZODone  Assesment: He was admitted with COPD exacerbation. He was being treated for alcohol withdrawal but developed DVTs despite that. He was dehydrated on admission which is better. He has some element of malnutrition probably related to his chronic alcoholism. He is now in the intensive care unit on Precedex. This will be continued for now. With his COPD exacerbation he is at risk of developing pneumonia. Will recheck laboratory work and chest x-ray in the morning. Active Problems:   COPD (chronic obstructive pulmonary disease) with acute bronchitis (HCC)   Malnutrition of moderate degree   Chronic alcoholism ([place])   Alcohol withdrawal delirium (HCC)   Dehydration    Plan: Continue Precedex for now. Continue his other treatments. Chest x-ray and laboratory work in the morning.    LOS: 3 days   Lokelani Lutes L 07/13/2015, 9:43 AM

## 2015-07-14 LAB — CBC WITH DIFFERENTIAL/PLATELET
Basophils Absolute: 0 10*3/uL (ref 0.0–0.1)
Basophils Relative: 0 %
EOS ABS: 0.3 10*3/uL (ref 0.0–0.7)
EOS PCT: 4 %
HCT: 47 % (ref 39.0–52.0)
HEMOGLOBIN: 16.2 g/dL (ref 13.0–17.0)
LYMPHS ABS: 1 10*3/uL (ref 0.7–4.0)
LYMPHS PCT: 11 %
MCH: 35.9 pg — AB (ref 26.0–34.0)
MCHC: 34.5 g/dL (ref 30.0–36.0)
MCV: 104.2 fL — AB (ref 78.0–100.0)
MONOS PCT: 14 %
Monocytes Absolute: 1.4 10*3/uL — ABNORMAL HIGH (ref 0.1–1.0)
NEUTROS PCT: 71 %
Neutro Abs: 7 10*3/uL (ref 1.7–7.7)
PLATELETS: 112 10*3/uL — AB (ref 150–400)
RBC: 4.51 MIL/uL (ref 4.22–5.81)
RDW: 14.1 % (ref 11.5–15.5)
WBC: 9.8 10*3/uL (ref 4.0–10.5)

## 2015-07-14 MED ORDER — DEXMEDETOMIDINE HCL IN NACL 200 MCG/50ML IV SOLN
INTRAVENOUS | Status: AC
  Administered 2015-07-14: 0.4 ug/kg/h via INTRAVENOUS
  Filled 2015-07-14: qty 50

## 2015-07-14 MED ORDER — DEXMEDETOMIDINE HCL IN NACL 200 MCG/50ML IV SOLN
INTRAVENOUS | Status: AC
  Filled 2015-07-14: qty 50

## 2015-07-14 MED ORDER — DEXMEDETOMIDINE HCL IN NACL 200 MCG/50ML IV SOLN
0.4000 ug/kg/h | INTRAVENOUS | Status: DC
  Administered 2015-07-14 (×2): 0.7 ug/kg/h via INTRAVENOUS
  Administered 2015-07-14: 0.8 ug/kg/h via INTRAVENOUS
  Administered 2015-07-14: 0.4 ug/kg/h via INTRAVENOUS
  Administered 2015-07-14: 0.8 ug/kg/h via INTRAVENOUS
  Administered 2015-07-15 – 2015-07-16 (×9): 0.7 ug/kg/h via INTRAVENOUS
  Administered 2015-07-16: 0.6 ug/kg/h via INTRAVENOUS
  Filled 2015-07-14 (×13): qty 50

## 2015-07-14 MED ORDER — HALOPERIDOL LACTATE 5 MG/ML IJ SOLN
5.0000 mg | Freq: Once | INTRAMUSCULAR | Status: AC
  Administered 2015-07-14: 5 mg via INTRAVENOUS
  Filled 2015-07-14: qty 1

## 2015-07-14 NOTE — Progress Notes (Signed)
Subjective: He is more alert. No new complaints. I discussed the situation with his family and they're very concerned about his alcoholism and would like to see if he can be committed for treatment. He is much better as far as his respiratory status is concerned  Objective: Vital signs in last 24 hours: Temp:  [97.5 F (36.4 C)-98.4 F (36.9 C)] 98.3 F (36.8 C) (12/12 0400) Pulse Rate:  [53-105] 103 (12/12 0600) Resp:  [17-27] 27 (12/12 0600) BP: (123-168)/(71-139) 147/93 mmHg (12/12 0600) SpO2:  [92 %-100 %] 92 % (12/12 0600) FiO2 (%):  [21 %] 21 % (12/12 0000) Weight:  [85.1 kg (187 lb 9.8 oz)] 85.1 kg (187 lb 9.8 oz) (12/12 0500) Weight change: -1.1 kg (-2 lb 6.8 oz) Last BM Date: 07/13/15  Intake/Output from previous day: 12/11 0701 - 12/12 0700 In: 4235.2 [P.O.:480; I.V.:3655.2; IV Piggyback:100] Out: 1400 [Urine:1400]  PHYSICAL EXAM General appearance: alert and He is more cooperative. He is not as short of breath. He is not as tremulous Resp: His chest is clear today with prolonged expiratory phase Cardio: regular rate and rhythm, S1, S2 normal, no murmur, click, rub or gallop GI: soft, non-tender; bowel sounds normal; no masses,  no organomegaly Extremities: extremities normal, atraumatic, no cyanosis or edema  Lab Results:  Results for orders placed or performed during the hospital encounter of 07/10/15 (from the past 48 hour(s))  MRSA PCR Screening     Status: None   Collection Time: 07/13/15  5:00 AM  Result Value Ref Range   MRSA by PCR NEGATIVE NEGATIVE    Comment:        The GeneXpert MRSA Assay (FDA approved for NASAL specimens only), is one component of a comprehensive MRSA colonization surveillance program. It is not intended to diagnose MRSA infection nor to guide or monitor treatment for MRSA infections.   Basic metabolic panel     Status: Abnormal   Collection Time: 07/13/15  8:55 PM  Result Value Ref Range   Sodium 139 135 - 145 mmol/L   Potassium 3.7 3.5 - 5.1 mmol/L   Chloride 106 101 - 111 mmol/L   CO2 21 (L) 22 - 32 mmol/L   Glucose, Bld 90 65 - 99 mg/dL   BUN 22 (H) 6 - 20 mg/dL   Creatinine, Ser 0.72 0.61 - 1.24 mg/dL   Calcium 9.0 8.9 - 10.3 mg/dL   GFR calc non Af Amer >60 >60 mL/min   GFR calc Af Amer >60 >60 mL/min    Comment: (NOTE) The eGFR has been calculated using the CKD EPI equation. This calculation has not been validated in all clinical situations. eGFR's persistently <60 mL/min signify possible Chronic Kidney Disease.    Anion gap 12 5 - 15  CBC with Differential/Platelet     Status: Abnormal   Collection Time: 07/14/15  4:12 AM  Result Value Ref Range   WBC 9.8 4.0 - 10.5 K/uL   RBC 4.51 4.22 - 5.81 MIL/uL   Hemoglobin 16.2 13.0 - 17.0 g/dL   HCT 47.0 39.0 - 52.0 %   MCV 104.2 (H) 78.0 - 100.0 fL   MCH 35.9 (H) 26.0 - 34.0 pg   MCHC 34.5 30.0 - 36.0 g/dL   RDW 14.1 11.5 - 15.5 %   Platelets 112 (L) 150 - 400 K/uL    Comment: SPECIMEN CHECKED FOR CLOTS CONSISTENT WITH PREVIOUS RESULT    Neutrophils Relative % 71 %   Neutro Abs 7.0 1.7 - 7.7 K/uL  Lymphocytes Relative 11 %   Lymphs Abs 1.0 0.7 - 4.0 K/uL   Monocytes Relative 14 %   Monocytes Absolute 1.4 (H) 0.1 - 1.0 K/uL   Eosinophils Relative 4 %   Eosinophils Absolute 0.3 0.0 - 0.7 K/uL   Basophils Relative 0 %   Basophils Absolute 0.0 0.0 - 0.1 K/uL    ABGS No results for input(s): PHART, PO2ART, TCO2, HCO3 in the last 72 hours.  Invalid input(s): PCO2 CULTURES Recent Results (from the past 240 hour(s))  C difficile quick scan w PCR reflex     Status: None   Collection Time: 07/11/15 12:50 PM  Result Value Ref Range Status   C Diff antigen NEGATIVE NEGATIVE Final   C Diff toxin NEGATIVE NEGATIVE Final   C Diff interpretation Negative for toxigenic C. difficile  Final  MRSA PCR Screening     Status: None   Collection Time: 07/13/15  5:00 AM  Result Value Ref Range Status   MRSA by PCR NEGATIVE NEGATIVE Final     Comment:        The GeneXpert MRSA Assay (FDA approved for NASAL specimens only), is one component of a comprehensive MRSA colonization surveillance program. It is not intended to diagnose MRSA infection nor to guide or monitor treatment for MRSA infections.    Studies/Results: Dg Chest Port 1 View  07/13/2015  CLINICAL DATA:  COPD, bronchitis EXAM: PORTABLE CHEST 1 VIEW COMPARISON:  07/10/2015 FINDINGS: Study is limited by poor inspiration. There is worsening in aeration. Central mild vascular congestion. Streaky left infrahilar and left basilar atelectasis or infiltrate. Mild right basilar atelectasis. Right shoulder prosthesis again noted. IMPRESSION: There is worsening in aeration. Central mild vascular congestion. Streaky left infrahilar and left basilar atelectasis or infiltrate. Mild right basilar atelectasis. Electronically Signed   By: Lahoma Crocker M.D.   On: 07/13/2015 11:29    Medications:  Prior to Admission:  Prescriptions prior to admission  Medication Sig Dispense Refill Last Dose  . atorvastatin (LIPITOR) 20 MG tablet Take 20 mg by mouth daily.   Past Week at Unknown time  . BREO ELLIPTA 100-25 MCG/INH AEPB Inhale 1 Dose into the lungs as needed (shortness of breath).   12 Past Week at Unknown time  . cephALEXin (KEFLEX) 500 MG capsule Take 500 mg by mouth 2 (two) times daily.  0 07/10/2015 at Unknown time  . clonazePAM (KLONOPIN) 1 MG tablet Take 1 mg by mouth daily.  5 07/10/2015 at Unknown time  . OVER THE COUNTER MEDICATION Take 1 scoop by mouth at bedtime. Oxy-powder (colon cleanser).   Past Week at Unknown time  . pantoprazole (PROTONIX) 40 MG tablet Take 40 mg by mouth. Takes every 2 days  0 Past Week at Unknown time  . PROAIR HFA 108 (90 BASE) MCG/ACT inhaler Inhale 2 Inhalers into the lungs every 6 (six) hours as needed for wheezing or shortness of breath.   2 Past Week at Unknown time   Scheduled: . atorvastatin  20 mg Oral Daily  . enoxaparin (LOVENOX) injection   40 mg Subcutaneous Q24H  . guaiFENesin  600 mg Oral BID  . ipratropium-albuterol  3 mL Nebulization Q6H WA  . levofloxacin (LEVAQUIN) IV  500 mg Intravenous Q24H  . pantoprazole  40 mg Oral Daily  . banana bag IV 1000 mL   Intravenous Daily  . sodium chloride  3 mL Intravenous Q12H   Continuous: . sodium chloride 50 mL/hr at 07/14/15 0600   WCB:[id number] **OR** acetaminophen,  albuterol, alum & mag hydroxide-simeth, fentaNYL (SUBLIMAZE) injection, hydrALAZINE, HYDROcodone-acetaminophen, LORazepam, ondansetron **OR** ondansetron (ZOFRAN) IV, traZODone  Assesment: He was admitted with COPD exacerbation. This has improved. His COPD is complicated by chronic alcoholism and on this admission by alcohol withdrawal delirium and full-blown DTs despite being on protocol. He was dehydrated on admission which is better. He has moderate malnutrition due to his alcoholism. He has chronic pain in his knees and back and I think he has been to some extent treating his pain with alcohol. He required inpatient commitment about 25 years ago and maintained sobriety for about 15 years after that but has been drinking alcohol for the last 10 years or so although in my office he told me that he was not. Apparently he's been able to control his alcohol use until about 3 months ago. Active Problems:   COPD (chronic obstructive pulmonary disease) with acute bronchitis (HCC)   Malnutrition of moderate degree   Chronic alcoholism ([place])   Alcohol withdrawal delirium ([place])   Dehydration    Plan: I will request tele-psych consultation. Continue treatments. No other new medications.    LOS: 4 days   Nazir Hacker L 07/14/2015, 7:40 AM

## 2015-07-14 NOTE — Progress Notes (Signed)
Tele-psych ready for assessment but patient is more confused this afternoon. SW asked them to assess patient in AM for this RN.

## 2015-07-14 NOTE — Progress Notes (Signed)
Pt agitated and confused.  He put on his clothes and said he was leaving.  Pt had a staggered gait. MD notified and gave orders to give haldol and precedex.  Also, TTS consult D/C'ed d/t increased confusion.

## 2015-07-15 MED ORDER — DEXMEDETOMIDINE HCL IN NACL 200 MCG/50ML IV SOLN
INTRAVENOUS | Status: AC
  Filled 2015-07-15: qty 50

## 2015-07-15 MED ORDER — THIAMINE HCL 100 MG/ML IJ SOLN
INTRAVENOUS | Status: DC
  Administered 2015-07-15: 16:00:00 via INTRAVENOUS
  Filled 2015-07-15 (×2): qty 1000

## 2015-07-15 NOTE — Progress Notes (Signed)
Subjective: He is back on Precedex and comfortable. He became very agitated yesterday and required re-sedation. The psychiatry consultation could not be done  Objective: Vital signs in last 24 hours: Temp:  [97 F (36.1 C)-98 F (36.7 C)] 97.6 F (36.4 C) (12/13 0759) Pulse Rate:  [54-105] 74 (12/13 0738) Resp:  [15-32] 15 (12/13 0738) BP: (111-167)/(75-104) 136/89 mmHg (12/13 0738) SpO2:  [91 %-99 %] 95 % (12/13 0738) FiO2 (%):  [21 %] 21 % (12/13 0400) Weight:  [85 kg (187 lb 6.3 oz)] 85 kg (187 lb 6.3 oz) (12/13 0500) Weight change: -0.1 kg (-3.5 oz) Last BM Date: 07/14/15  Intake/Output from previous day: 12/12 0701 - 12/13 0700 In: 1531.7 [I.V.:1431.7; IV Piggyback:100] Out: 650 [Urine:650]  PHYSICAL EXAM General appearance: Sedated he will open his eyes Resp: rhonchi bilaterally Cardio: regular rate and rhythm, S1, S2 normal, no murmur, click, rub or gallop GI: soft, non-tender; bowel sounds normal; no masses,  no organomegaly Extremities: extremities normal, atraumatic, no cyanosis or edema  Lab Results:  Results for orders placed or performed during the hospital encounter of 07/10/15 (from the past 48 hour(s))  Basic metabolic panel     Status: Abnormal   Collection Time: 07/13/15  8:55 PM  Result Value Ref Range   Sodium 139 135 - 145 mmol/[place]   Potassium 3.7 3.5 - 5.1 mmol/[place]   Chloride 106 101 - 111 mmol/[place]   CO2 21 ([place]) 22 - 32 mmol/[place]   Glucose, Bld 90 65 - 99 mg/dL   BUN 22 (H) 6 - 20 mg/dL   Creatinine, Ser 0.72 0.61 - 1.24 mg/dL   Calcium 9.0 8.9 - 10.3 mg/dL   GFR calc non Af Amer >60 >60 mL/min   GFR calc Af Amer >60 >60 mL/min    Comment: (NOTE) The eGFR has been calculated using the CKD EPI equation. This calculation has not been validated in all clinical situations. eGFR's persistently <60 mL/min signify possible Chronic Kidney Disease.    Anion gap 12 5 - 15  CBC with Differential/Platelet     Status: Abnormal   Collection Time: 07/14/15  4:12 AM   Result Value Ref Range   WBC 9.8 4.0 - 10.5 K/uL   RBC 4.51 4.22 - 5.81 MIL/uL   Hemoglobin 16.2 13.0 - 17.0 g/dL   HCT 47.0 39.0 - 52.0 %   MCV 104.2 (H) 78.0 - 100.0 fL   MCH 35.9 (H) 26.0 - 34.0 pg   MCHC 34.5 30.0 - 36.0 g/dL   RDW 14.1 11.5 - 15.5 %   Platelets 112 ([place]) 150 - 400 K/uL    Comment: SPECIMEN CHECKED FOR CLOTS CONSISTENT WITH PREVIOUS RESULT    Neutrophils Relative % 71 %   Neutro Abs 7.0 1.7 - 7.7 K/uL   Lymphocytes Relative 11 %   Lymphs Abs 1.0 0.7 - 4.0 K/uL   Monocytes Relative 14 %   Monocytes Absolute 1.4 (H) 0.1 - 1.0 K/uL   Eosinophils Relative 4 %   Eosinophils Absolute 0.3 0.0 - 0.7 K/uL   Basophils Relative 0 %   Basophils Absolute 0.0 0.0 - 0.1 K/uL    ABGS No results for input(s): PHART, PO2ART, TCO2, HCO3 in the last 72 hours.  Invalid input(s): PCO2 CULTURES Recent Results (from the past 240 hour(s))  C difficile quick scan w PCR reflex     Status: None   Collection Time: 07/11/15 12:50 PM  Result Value Ref Range Status   C Diff antigen NEGATIVE NEGATIVE  Final   C Diff toxin NEGATIVE NEGATIVE Final   C Diff interpretation Negative for toxigenic C. difficile  Final  MRSA PCR Screening     Status: None   Collection Time: 07/13/15  5:00 AM  Result Value Ref Range Status   MRSA by PCR NEGATIVE NEGATIVE Final    Comment:        The GeneXpert MRSA Assay (FDA approved for NASAL specimens only), is one component of a comprehensive MRSA colonization surveillance program. It is not intended to diagnose MRSA infection nor to guide or monitor treatment for MRSA infections.    Studies/Results: Dg Chest Port 1 View  07/13/2015  CLINICAL DATA:  COPD, bronchitis EXAM: PORTABLE CHEST 1 VIEW COMPARISON:  07/10/2015 FINDINGS: Study is limited by poor inspiration. There is worsening in aeration. Central mild vascular congestion. Streaky left infrahilar and left basilar atelectasis or infiltrate. Mild right basilar atelectasis. Right shoulder  prosthesis again noted. IMPRESSION: There is worsening in aeration. Central mild vascular congestion. Streaky left infrahilar and left basilar atelectasis or infiltrate. Mild right basilar atelectasis. Electronically Signed   By: Lahoma Crocker M.D.   On: 07/13/2015 11:29    Medications:  Prior to Admission:  Prescriptions prior to admission  Medication Sig Dispense Refill Last Dose  . atorvastatin (LIPITOR) 20 MG tablet Take 20 mg by mouth daily.   Past Week at Unknown time  . BREO ELLIPTA 100-25 MCG/INH AEPB Inhale 1 Dose into the lungs as needed (shortness of breath).   12 Past Week at Unknown time  . cephALEXin (KEFLEX) 500 MG capsule Take 500 mg by mouth 2 (two) times daily.  0 07/10/2015 at Unknown time  . clonazePAM (KLONOPIN) 1 MG tablet Take 1 mg by mouth daily.  5 07/10/2015 at Unknown time  . OVER THE COUNTER MEDICATION Take 1 scoop by mouth at bedtime. Oxy-powder (colon cleanser).   Past Week at Unknown time  . pantoprazole (PROTONIX) 40 MG tablet Take 40 mg by mouth. Takes every 2 days  0 Past Week at Unknown time  . PROAIR HFA 108 (90 BASE) MCG/ACT inhaler Inhale 2 Inhalers into the lungs every 6 (six) hours as needed for wheezing or shortness of breath.   2 Past Week at Unknown time   Scheduled: . atorvastatin  20 mg Oral Daily  . enoxaparin (LOVENOX) injection  40 mg Subcutaneous Q24H  . guaiFENesin  600 mg Oral BID  . ipratropium-albuterol  3 mL Nebulization Q6H WA  . levofloxacin (LEVAQUIN) IV  500 mg Intravenous Q24H  . pantoprazole  40 mg Oral Daily  . banana bag IV 1000 mL   Intravenous Daily  . sodium chloride  3 mL Intravenous Q12H   Continuous: . sodium chloride 50 mL/hr at 07/15/15 0600  . dexmedetomidine 0.7 mcg/kg/hr (07/15/15 0650)   RFX:[id number] **OR** acetaminophen, albuterol, alum & mag hydroxide-simeth, fentaNYL (SUBLIMAZE) injection, hydrALAZINE, HYDROcodone-acetaminophen, LORazepam, ondansetron **OR** ondansetron (ZOFRAN) IV, traZODone  Assesment: He  was admitted with COPD exacerbation and dehydration and malnutrition. He is known to have chronic alcoholism and despite being on protocol he developed alcohol withdrawal delirium. He was placed on Precedex and improved but then after this was tapered he had much more problem again. His family thinks he is going to require commitment for alcohol treatment. He will need psychiatric consultation but that's not going to be able to be done today Active Problems:   COPD (chronic obstructive pulmonary disease) with acute bronchitis (HCC)   Malnutrition of moderate degree   Chronic  alcoholism ([place])   Alcohol withdrawal delirium ([place])   Dehydration    Plan: Continue Precedex. Continue other treatments. He is requiring Ativan as well.    LOS: 5 days   Adam Jordan 07/15/2015, 8:35 AM

## 2015-07-16 MED ORDER — VITAMIN B-1 100 MG PO TABS
100.0000 mg | ORAL_TABLET | Freq: Every day | ORAL | Status: DC
  Administered 2015-07-16 – 2015-07-17 (×2): 100 mg via ORAL
  Filled 2015-07-16 (×2): qty 1

## 2015-07-16 MED ORDER — FOLIC ACID 1 MG PO TABS
1.0000 mg | ORAL_TABLET | Freq: Every day | ORAL | Status: DC
  Administered 2015-07-16 – 2015-07-17 (×2): 1 mg via ORAL
  Filled 2015-07-16 (×2): qty 1

## 2015-07-16 MED ORDER — DEXMEDETOMIDINE HCL IN NACL 200 MCG/50ML IV SOLN: 0.4000 ug/kg/h | INTRAVENOUS | Status: DC

## 2015-07-16 MED ORDER — ADULT MULTIVITAMIN W/MINERALS CH
1.0000 | ORAL_TABLET | Freq: Every day | ORAL | Status: DC
  Administered 2015-07-16 – 2015-07-17 (×2): 1 via ORAL
  Filled 2015-07-16 (×2): qty 1

## 2015-07-16 NOTE — Care Management Note (Signed)
Case Management Note  Patient Details  Name: Adam Jordan MRN: [id number] Date of Birth: [birth year]  Subjective/Objective:                  Pt is from home, lives with his wife and is ind with ADL's. Pt has no HH services prior to admission. Pt has a neb machine and purchases Boost O2 tanks online to use PRN. Pt complaining of knee pain that is chronic. Pt requesting rollator when he is discharged. Pt has chosen [facility] for DME needs.    Action/Plan: Pt plans to return home with self care at DC. Romualdo Bolk, of [facility], made aware of referral and will obtain pt info from chart and deliver rollator to pt's room prior to DC. No further CM needs.    Expected Discharge Date:   07/17/2015               Expected Discharge Plan:  Home/Self Care  In-House Referral:  NA  Discharge planning Services  CM Consult  Post Acute Care Choice:  Durable Medical Equipment Choice offered to:     DME Arranged:  [place] rolling with seat DME Agency:  [place]:    [facility] Agency:     Status of Service:  Completed, signed off  Medicare Important Message Given:  Yes Date Medicare IM Given:    Medicare IM give by:    Date Additional Medicare IM Given:    Additional Medicare Important Message give by:     If discussed at [place] of Stay Meetings, dates discussed:    Additional Comments:  Sherald Barge, RN 07/16/2015, 2:05 PM

## 2015-07-16 NOTE — Clinical Social Work Note (Signed)
Clinical Social Work Assessment  Patient Details  Name: Adam Jordan MRN: [id number] Date of Birth: 11/21/1945  Date of referral:  07/16/15               Reason for consult:  Substance Use/ETOH Abuse                Permission sought to share information with:    Permission granted to share information::     Name::        Agency::     Relationship::     Contact Information:     Housing/Transportation Living arrangements for the past 2 months:  Single Family Home Source of Information:  Patient Patient Interpreter Needed:  None Criminal Activity/Legal Involvement Pertinent to Current Situation/Hospitalization:  No - Comment as needed Significant Relationships:  Adult Children, Spouse, Other Family Members Lives with:  Spouse Do you feel safe going back to the place where you live?  Yes Need for family participation in patient care:  Yes (Comment)  Care giving concerns:  None identified.   Social Worker assessment / plan:  CSW met with patient who advised that he resides in the home with his wife. He stated that at baseline he ambulates and completes ADLs independently.  CSW discussed the purpose of the contact being concerns related to alcohol use. Patient agreed that he did have a problem with drinking alcohol.  He reported that he began drinking at 69 years old. He did report long periods of sustained abstinence from drinking alcohol. He stated that in the past he went to [facility] for detox and "[facility]" for 30 days many years ago.  Patient stated that his most recent episode of drinking started again after being sober for 10 years when his father passed in 2001.  Patient stated that he realized about six months ago that he would need to go to detox because when he attempted to reduce his alcohol intake, he began having withdrawal symptoms.  CSW discussed treatment readiness and treatment options.  Patient stated he feels it is time for him to reenter treatment. 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  He stated that he wanted to attend treatment in [facility] or [facility].  CSW provided patient with a list of out patient providers.  CSW discussed the providers that were in the area requested.  CSW offered to schedule patient an appointment. Patient stated that he would look over the facilities and discuss it with his wife and he and his wife would schedule the appointment. CSW stressed the importance of scheduling the appointment quickly so that he would not have a gap upon release from the hospital.  Patient agreed.  Patient stated that his wife would leave him if his drinking continued and this was also a motivating factor to enter outpatient treatment.  CSW completed SBIRT with patient.  CSW signing off.   Employment status:  Retired Nurse, adult PT Recommendations:  Not assessed at this time Information / Referral to community resources:  Outpatient Substance Abuse Treatment Options  Patient/Family's Response to care: Patient is agreeable to go to outpatient substance abuse treatment.   Patient/Family's Understanding of and Emotional Response to Diagnosis, Current Treatment, and Prognosis:  Patient understands that his alcohol use is negatively impacting his overall health.   Emotional Assessment Appearance:  Appears stated age Attitude/Demeanor/Rapport:   (Cooperative) Affect (typically observed):  Accepting, Calm  Orientation:  Oriented to Self, Oriented to Place, Oriented to  Time, Oriented to Situation Alcohol / Substance use:  Alcohol Use Psych involvement (Current and /or in the community):  Yes (Comment)  Discharge Needs  Concerns to be addressed:  Substance Abuse Concerns Readmission within the last 30 days:  No Current discharge risk:   (Alcohol abuse) Barriers to Discharge:  No Barriers Identified   Ihor Gully,  LCSW 07/16/2015, 12:13 PM

## 2015-07-16 NOTE — Progress Notes (Signed)
Subjective: He is significantly improved. He is less agitated. He is on Precedex and Ativan.  Objective: Vital signs in last 24 hours: Temp:  [97 F (36.1 C)-97.8 F (36.6 C)] 97 F (36.1 C) (12/14 0400) Pulse Rate:  [54-66] 58 (12/14 0600) Resp:  [11-25] 24 (12/14 0600) BP: (113-180)/(72-111) 160/91 mmHg (12/14 0600) SpO2:  [92 %-100 %] 98 % (12/14 0600) FiO2 (%):  [21 %] 21 % (12/14 0400) Weight:  [85.5 kg (188 lb 7.9 oz)] 85.5 kg (188 lb 7.9 oz) (12/14 0500) Weight change: 0.5 kg (1 lb 1.6 oz) Last BM Date: 07/14/15  Intake/Output from previous day: 12/13 0701 - 12/14 0700 In: 1618.9 [I.V.:1518.9; IV Piggyback:100] Out: 250 [Urine:250]  PHYSICAL EXAM General appearance: alert, cooperative and no distress Resp: rhonchi bilaterally Cardio: regular rate and rhythm, S1, S2 normal, no murmur, click, rub or gallop GI: soft, non-tender; bowel sounds normal; no masses,  no organomegaly Extremities: extremities normal, atraumatic, no cyanosis or edema  Lab Results:  No results found for this or any previous visit (from the past 48 hour(s)).  ABGS No results for input(s): PHART, PO2ART, TCO2, HCO3 in the last 72 hours.  Invalid input(s): PCO2 CULTURES Recent Results (from the past 240 hour(s))  C difficile quick scan w PCR reflex     Status: None   Collection Time: 07/11/15 12:50 PM  Result Value Ref Range Status   C Diff antigen NEGATIVE NEGATIVE Final   C Diff toxin NEGATIVE NEGATIVE Final   C Diff interpretation Negative for toxigenic C. difficile  Final  MRSA PCR Screening     Status: None   Collection Time: 07/13/15  5:00 AM  Result Value Ref Range Status   MRSA by PCR NEGATIVE NEGATIVE Final    Comment:        The GeneXpert MRSA Assay (FDA approved for NASAL specimens only), is one component of a comprehensive MRSA colonization surveillance program. It is not intended to diagnose MRSA infection nor to guide or monitor treatment for MRSA infections.     Studies/Results: No results found.  Medications:  Prior to Admission:  Prescriptions prior to admission  Medication Sig Dispense Refill Last Dose  . atorvastatin (LIPITOR) 20 MG tablet Take 20 mg by mouth daily.   Past Week at Unknown time  . BREO ELLIPTA 100-25 MCG/INH AEPB Inhale 1 Dose into the lungs as needed (shortness of breath).   12 Past Week at Unknown time  . cephALEXin (KEFLEX) 500 MG capsule Take 500 mg by mouth 2 (two) times daily.  0 07/10/2015 at Unknown time  . clonazePAM (KLONOPIN) 1 MG tablet Take 1 mg by mouth daily.  5 07/10/2015 at Unknown time  . OVER THE COUNTER MEDICATION Take 1 scoop by mouth at bedtime. Oxy-powder (colon cleanser).   Past Week at Unknown time  . pantoprazole (PROTONIX) 40 MG tablet Take 40 mg by mouth. Takes every 2 days  0 Past Week at Unknown time  . PROAIR HFA 108 (90 BASE) MCG/ACT inhaler Inhale 2 Inhalers into the lungs every 6 (six) hours as needed for wheezing or shortness of breath.   2 Past Week at Unknown time   Scheduled: . atorvastatin  20 mg Oral Daily  . enoxaparin (LOVENOX) injection  40 mg Subcutaneous Q24H  . guaiFENesin  600 mg Oral BID  . levofloxacin (LEVAQUIN) IV  500 mg Intravenous Q24H  . pantoprazole  40 mg Oral Daily  . banana bag IV 1000 mL   Intravenous Q24H  .  sodium chloride  3 mL Intravenous Q12H   Continuous: . sodium chloride 50 mL/hr at 07/16/15 0500  . dexmedetomidine 0.6 mcg/kg/hr (07/16/15 0500)   TKK:[id number] **OR** acetaminophen, albuterol, fentaNYL (SUBLIMAZE) injection, hydrALAZINE, HYDROcodone-acetaminophen, LORazepam, ondansetron **OR** ondansetron (ZOFRAN) IV, traZODone  Assesment: He was admitted with COPD exacerbation and that has improved. He was also dehydrated. He has chronic alcoholism and had delirium tremens and that is improving. He has been very agitated and confused and I wonder if he has some baseline mental problems or dementia. Active Problems:   COPD (chronic obstructive  pulmonary disease) with acute bronchitis (HCC)   Malnutrition of moderate degree   Chronic alcoholism ([place])   Alcohol withdrawal delirium (HCC)   Dehydration    Plan: Wean Precedex. Continue Ativan. Try again for psychiatry consultation. Reduce his IV fluids. If he does okay when he comes off Precedex he can transfer out of the ICU    LOS: 6 days   Gwyn Mehring L 07/16/2015, 7:41 AM

## 2015-07-16 NOTE — Consult Note (Signed)
[place] A. Merlene Laughter, MD     [email or web address]          Adam Jordan is an 69 y.o. male.   ASSESSMENT/PLAN: 1. Resolved delirium tremors. No additional recommendations suggested at this time as the patient has improved and appears to be at baseline.  The patient is a 69 year old white male who has been admitted to the hospital for alcohol withdrawal. It appears he was also admitted for COPD issues. The patient appears to be in in delirium tremors. He has been quite agitated and irritated. The encephalopathy of persistent and hence the neurological consultation. The patient however appears to have improved significantly today. The patient thinks he is at baseline. The patient does admit to drinking alcohol significantly but reports that he has stopped since he was admitted to the hospital. Review of systems otherwise negative.  GENERAL: Pleasant male in no acute distress.  HEENT: Supple. Atraumatic normocephalic.   ABDOMEN: soft  EXTREMITIES: No edema   BACK: Normal.  SKIN: Normal by inspection.    MENTAL STATUS: Alert and oriented. Speech, language and cognition are generally intact. Judgment and insight normal.   CRANIAL NERVES: Pupils are equal, round and reactive to light and accommodation; extra ocular movements are full, there is no significant nystagmus; visual fields are full; upper and lower facial muscles are normal in strength and symmetric, there is no flattening of the nasolabial folds; tongue is midline; uvula is midline; shoulder elevation is normal.  MOTOR: Normal tone, bulk and strength; no pronator drift.  COORDINATION: Left finger to nose is normal, right finger to nose is normal, No rest tremor; no intention tremor; no postural tremor; no bradykinesia.  REFLEXES: Deep tendon reflexes are symmetrical and normal. Babinski reflexes are flexor bilaterally.   SENSATION: Normal to light touch.   Blood pressure 148/91, pulse 86, temperature  97.4 F (36.3 C), temperature source Oral, resp. rate 19, height '5\' 8"'$  (1.727 m), weight 85.5 kg (188 lb 7.9 oz), SpO2 95 %.  Past Medical History  Diagnosis Date  . COPD (chronic obstructive pulmonary disease) ([place])   . GERD (gastroesophageal reflux disease)     Past Surgical History  Procedure Laterality Date  . Total shoulder replacement    . Thumb surgery    . Knee surgery      History reviewed. No pertinent family history.  Social History:  reports that he has been smoking.  He does not have any smokeless tobacco history on file. He reports that he drinks alcohol. He reports that he does not use illicit drugs.  Allergies:  Allergies  Allergen Reactions  . Losartan Anaphylaxis  . Peanut-Containing Drug Products Anaphylaxis  . Other     bp medication that started with "L"  Says was not lisinopril    Medications: Prior to Admission medications   Medication Sig Start Date End Date Taking? Authorizing Provider  atorvastatin (LIPITOR) 20 MG tablet Take 20 mg by mouth daily.   Yes Historical Provider, MD  BREO ELLIPTA 100-25 MCG/INH AEPB Inhale 1 Dose into the lungs as needed (shortness of breath).  04/11/15  Yes Historical Provider, MD  cephALEXin (KEFLEX) 500 MG capsule Take 500 mg by mouth 2 (two) times daily. 07/09/15  Yes Historical Provider, MD  clonazePAM (KLONOPIN) 1 MG tablet Take 1 mg by mouth daily. 06/13/15  Yes Historical Provider, MD  OVER THE COUNTER MEDICATION Take 1 scoop by mouth at bedtime. Oxy-powder (colon cleanser).   Yes Historical Provider, MD  pantoprazole (PROTONIX) 40  MG tablet Take 40 mg by mouth. Takes every 2 days 05/09/15  Yes Historical Provider, MD  PROAIR HFA 108 (90 BASE) MCG/ACT inhaler Inhale 2 Inhalers into the lungs every 6 (six) hours as needed for wheezing or shortness of breath.  04/11/15  Yes Historical Provider, MD    Scheduled Meds: . atorvastatin  20 mg Oral Daily  . enoxaparin (LOVENOX) injection  40 mg Subcutaneous Q24H  . folic acid   1 mg Oral Daily  . guaiFENesin  600 mg Oral BID  . levofloxacin (LEVAQUIN) IV  500 mg Intravenous Q24H  . multivitamin with minerals  1 tablet Oral Daily  . pantoprazole  40 mg Oral Daily  . sodium chloride  3 mL Intravenous Q12H  . thiamine  100 mg Oral Daily   Continuous Infusions: . sodium chloride 50 mL/hr at 07/16/15 1548  . dexmedetomidine Stopped (07/16/15 1000)   PRN Meds:.acetaminophen **OR** acetaminophen, albuterol, fentaNYL (SUBLIMAZE) injection, hydrALAZINE, HYDROcodone-acetaminophen, LORazepam, ondansetron **OR** ondansetron (ZOFRAN) IV, traZODone     No results found for this or any previous visit (from the past 86 hour(s)).  Studies/Results:     Dinah Lupa A. Merlene Laughter, M.D.  Diplomate, Tax adviser of Psychiatry and Neurology ( Neurology). 07/16/2015, 5:24 PM

## 2015-07-17 LAB — CREATININE, SERUM
Creatinine, Ser: 0.74 mg/dL (ref 0.61–1.24)
GFR calc Af Amer: >60 mL/min (ref >60)
GFR calc non Af Amer: >60 mL/min (ref >60)

## 2015-07-17 MED ORDER — ADULT MULTIVITAMIN W/MINERALS CH: 1.0000 | ORAL_TABLET | Freq: Every day | ORAL | Status: DC

## 2015-07-17 MED ORDER — THIAMINE HCL 100 MG PO TABS: 100.0000 mg | ORAL_TABLET | Freq: Every day | ORAL | Status: DC

## 2015-07-17 MED ORDER — TRAZODONE HCL 50 MG PO TABS: 50.0000 mg | ORAL_TABLET | Freq: Every evening | ORAL | Status: DC | PRN

## 2015-07-17 MED ORDER — LEVOFLOXACIN 500 MG PO TABS: 500.0000 mg | ORAL_TABLET | ORAL | Status: DC

## 2015-07-17 MED ORDER — FOLIC ACID 1 MG PO TABS: 1.0000 mg | ORAL_TABLET | Freq: Every day | ORAL | Status: DC

## 2015-07-17 MED ORDER — CLONAZEPAM 1 MG PO TABS: 1.0000 mg | ORAL_TABLET | Freq: Two times a day (BID) | ORAL | Status: DC | PRN

## 2015-07-17 NOTE — H&P (Signed)
Adam Jordan MRN: [id number] DOB/AGE: 1945-12-13 69 y.o. Primary Care Physician:No primary care provider on file. Admit date: 07/10/2015 Chief Complaint: COPD exacerbation HPI: This is a 69 year old who came to my office on the day of admission with cough congestion weakness. He appeared to be dehydrated. He has also been drinking large quantities of alcohol. He is very hoarse. He has been coughing up sputum. He's had some fever. He is so short of breath he can't get back and forth to the bathroom. This is a redictation of history and physical that I did from my office on the day of admission  Past Medical History  Diagnosis Date  . COPD (chronic obstructive pulmonary disease) ([place])   . GERD (gastroesophageal reflux disease)    Past Surgical History  Procedure Laterality Date  . Total shoulder replacement    . Thumb surgery    . Knee surgery          History reviewed. No pertinent family history.  Social History:  reports that he has been smoking.  He does not have any smokeless tobacco history on file. He reports that he drinks alcohol. He reports that he does not use illicit drugs.   Allergies:  Allergies  Allergen Reactions  . Losartan Anaphylaxis  . Peanut-Containing Drug Products Anaphylaxis  . Other     bp medication that started with "Jordan"  Says was not lisinopril    Medications Prior to Admission  Medication Sig Dispense Refill  . atorvastatin (LIPITOR) 20 MG tablet Take 20 mg by mouth daily.    Marland Kitchen BREO ELLIPTA 100-25 MCG/INH AEPB Inhale 1 Dose into the lungs as needed (shortness of breath).   12  . cephALEXin (KEFLEX) 500 MG capsule Take 500 mg by mouth 2 (two) times daily.  0  . OVER THE COUNTER MEDICATION Take 1 scoop by mouth at bedtime. Oxy-powder (colon cleanser).    . pantoprazole (PROTONIX) 40 MG tablet Take 40 mg by mouth. Takes every 2 days  0  . PROAIR HFA 108 (90 BASE) MCG/ACT inhaler Inhale 2 Inhalers into the lungs every 6 (six) hours as needed for  wheezing or shortness of breath.   2  . [DISCONTINUED] clonazePAM (KLONOPIN) 1 MG tablet Take 1 mg by mouth daily.  5       DJS:HFWYO from the symptoms mentioned above,there are no other symptoms referable to all systems reviewed.  Physical Exam: Blood pressure 143/100, pulse 84, temperature 97.6 F (36.4 C), temperature source Oral, resp. rate 21, height '5\' 8"'$  (1.727 m), weight 88.8 kg (195 lb 12.3 oz), SpO2 96 %. He smells strongly of alcohol. He looks dehydrated. His mucous membranes are dry. His nose and throat are clear. His neck is supple without masses. His chest shows rhonchi and wheezes bilaterally. His heart is regular without gallop. His abdomen is soft with no masses. Extremities showed no edema. Central nervous system examination shows that he appears to be intoxicated   No results for input(s): WBC, NEUTROABS, HGB, HCT, MCV, PLT in the last 72 hours.  Recent Labs  07/17/15 0423  CREATININE 0.74  lablast2(ast:2,ALT:2,alkphos:2,bilitot:2,prot:2,albumin:2)@    Recent Results (from the past 240 hour(s))  C difficile quick scan w PCR reflex     Status: None   Collection Time: 07/11/15 12:50 PM  Result Value Ref Range Status   C Diff antigen NEGATIVE NEGATIVE Final   C Diff toxin NEGATIVE NEGATIVE Final   C Diff interpretation Negative for toxigenic C. difficile  Final  MRSA  PCR Screening     Status: None   Collection Time: 07/13/15  5:00 AM  Result Value Ref Range Status   MRSA by PCR NEGATIVE NEGATIVE Final    Comment:        The GeneXpert MRSA Assay (FDA approved for NASAL specimens only), is one component of a comprehensive MRSA colonization surveillance program. It is not intended to diagnose MRSA infection nor to guide or monitor treatment for MRSA infections.      X-ray Chest Pa And Lateral  07/10/2015  CLINICAL DATA:  COPD EXAM: CHEST - 2 VIEW COMPARISON:  None. FINDINGS: Cardiac shadow is within normal limits. Mild aortic calcifications are seen  without aneurysmal dilatation. The lungs are mildly hyperinflated. No focal infiltrate or sizable effusion is seen. No bony abnormality is noted. Prior surgical change in the right shoulder is noted. IMPRESSION: No acute abnormality seen. Electronically Signed   By: Inez Catalina M.D.   On: 07/10/2015 15:22   Dg Chest Port 1 View  07/13/2015  CLINICAL DATA:  COPD, bronchitis EXAM: PORTABLE CHEST 1 VIEW COMPARISON:  07/10/2015 FINDINGS: Study is limited by poor inspiration. There is worsening in aeration. Central mild vascular congestion. Streaky left infrahilar and left basilar atelectasis or infiltrate. Mild right basilar atelectasis. Right shoulder prosthesis again noted. IMPRESSION: There is worsening in aeration. Central mild vascular congestion. Streaky left infrahilar and left basilar atelectasis or infiltrate. Mild right basilar atelectasis. Electronically Signed   By: Lahoma Crocker M.D.   On: 07/13/2015 11:29   Impression: I think he has COPD exacerbation. He certainly could have pneumonia. He is dehydrated. There is concern about his chronic alcoholism Active Problems:   COPD (chronic obstructive pulmonary disease) with acute bronchitis (HCC)   Malnutrition of moderate degree   Chronic alcoholism ([place])   Alcohol withdrawal delirium ([place])   Dehydration     Plan: Admit for treatment of COPD. He is going to need protocol for alcohol withdrawal      Adam Jordan   07/17/2015, 8:48 AM

## 2015-07-17 NOTE — Care Management Important Message (Signed)
Important Message  Patient Details  Name: Adam Jordan MRN: [id number] Date of Birth: 11-20-1945   Medicare Important Message Given:  Yes    Sherald Barge, RN 07/17/2015, 12:32 PM

## 2015-07-17 NOTE — Discharge Summary (Signed)
Physician Discharge Summary  Patient ID: Adam Jordan MRN: [id number] DOB/AGE: May 12, 1946 69 y.o. Primary Care Physician:No primary care provider on file. Admit date: 07/10/2015 Discharge date: 07/17/2015    Discharge Diagnoses:   Active Problems:   COPD (chronic obstructive pulmonary disease) with acute bronchitis (HCC)   Malnutrition of moderate degree   Chronic alcoholism ([place])   Alcohol withdrawal delirium (HCC)   Dehydration     Medication List    STOP taking these medications        cephALEXin 500 MG capsule  Commonly known as:  KEFLEX      TAKE these medications        atorvastatin 20 MG tablet  Commonly known as:  LIPITOR  Take 20 mg by mouth daily.     BREO ELLIPTA 100-25 MCG/INH Aepb  Generic drug:  Fluticasone Furoate-Vilanterol  Inhale 1 Dose into the lungs as needed (shortness of breath).     clonazePAM 1 MG tablet  Commonly known as:  KLONOPIN  Take 1 tablet (1 mg total) by mouth 2 (two) times daily as needed for anxiety.     folic acid 1 MG tablet  Commonly known as:  FOLVITE  Take 1 tablet (1 mg total) by mouth daily.     multivitamin with minerals Tabs tablet  Take 1 tablet by mouth daily.     OVER THE COUNTER MEDICATION  Take 1 scoop by mouth at bedtime. Oxy-powder (colon cleanser).     pantoprazole 40 MG tablet  Commonly known as:  PROTONIX  Take 40 mg by mouth. Takes every 2 days     PROAIR HFA 108 (90 BASE) MCG/ACT inhaler  Generic drug:  albuterol  Inhale 2 Inhalers into the lungs every 6 (six) hours as needed for wheezing or shortness of breath.     thiamine 100 MG tablet  Take 1 tablet (100 mg total) by mouth daily.     traZODone 50 MG tablet  Commonly known as:  DESYREL  Take 1 tablet (50 mg total) by mouth at bedtime as needed for sleep.        Discharged Condition:improved    Consults:neurology  Significant Diagnostic Studies: X-ray Chest Pa And Lateral  07/10/2015  CLINICAL DATA:  COPD EXAM: CHEST - 2 VIEW  COMPARISON:  None. FINDINGS: Cardiac shadow is within normal limits. Mild aortic calcifications are seen without aneurysmal dilatation. The lungs are mildly hyperinflated. No focal infiltrate or sizable effusion is seen. No bony abnormality is noted. Prior surgical change in the right shoulder is noted. IMPRESSION: No acute abnormality seen. Electronically Signed   By: Inez Catalina M.D.   On: 07/10/2015 15:22   Dg Chest Port 1 View  07/13/2015  CLINICAL DATA:  COPD, bronchitis EXAM: PORTABLE CHEST 1 VIEW COMPARISON:  07/10/2015 FINDINGS: Study is limited by poor inspiration. There is worsening in aeration. Central mild vascular congestion. Streaky left infrahilar and left basilar atelectasis or infiltrate. Mild right basilar atelectasis. Right shoulder prosthesis again noted. IMPRESSION: There is worsening in aeration. Central mild vascular congestion. Streaky left infrahilar and left basilar atelectasis or infiltrate. Mild right basilar atelectasis. Electronically Signed   By: Lahoma Crocker M.D.   On: 07/13/2015 11:29    Lab Results: Basic Metabolic Panel:  Recent Labs  07/17/15 0423  CREATININE 0.74   Liver Function Tests: No results for input(s): AST, ALT, ALKPHOS, BILITOT, PROT, ALBUMIN in the last 72 hours.   CBC: No results for input(s): WBC, NEUTROABS, HGB, HCT, MCV, PLT in the  last 72 hours.  Recent Results (from the past 240 hour(s))  C difficile quick scan w PCR reflex     Status: None   Collection Time: 07/11/15 12:50 PM  Result Value Ref Range Status   C Diff antigen NEGATIVE NEGATIVE Final   C Diff toxin NEGATIVE NEGATIVE Final   C Diff interpretation Negative for toxigenic C. difficile  Final  MRSA PCR Screening     Status: None   Collection Time: 07/13/15  5:00 AM  Result Value Ref Range Status   MRSA by PCR NEGATIVE NEGATIVE Final    Comment:        The GeneXpert MRSA Assay (FDA approved for NASAL specimens only), is one component of a comprehensive MRSA  colonization surveillance program. It is not intended to diagnose MRSA infection nor to guide or monitor treatment for MRSA infections.      Hospital Course: This is a 69 year old who came to my office with COPD exacerbation. He was intoxicated at the time. He appeared to be dehydrated. He was admitted to the hospital started on antibiotics inhale bronchodilators and seem to be improving as far as his COPD was concerned. He was also started on alcohol withdrawal protocol but despite that developed full-blown delirium tremens. He required Precedex. I had multiple discussions with his family and they were hoping that he could be involuntarily committed for alcohol treatment but he does not have a committable problem. He is much improved now and ready for discharge. His chest is much clearer and his mind is back to baseline  Discharge Exam: Blood pressure 143/100, pulse 84, temperature 97.6 F (36.4 C), temperature source Oral, resp. rate 21, height '5\' 8"'$  (1.727 m), weight 88.8 kg (195 lb 12.3 oz), SpO2 96 %. He looks better. His chest is clear. His heart is regular. He is not tremulous  Disposition: Home with home alcohol treatment as an outpatient      Discharge Instructions    Discharge patient    Complete by:  As directed              Signed: Rein Popov L   07/17/2015, 8:53 AM

## 2015-07-17 NOTE — Progress Notes (Signed)
Discharge instruction reviewed with patient's wife. Medications and prescriptions given to patient's wife. No distress noted. Taken to lobby via wheelchair.

## 2015-07-17 NOTE — Progress Notes (Signed)
Subjective: He's better. He has no new complaints. Neurology help is noted and appreciated. I think he is back to baseline  Objective: Vital signs in last 24 hours: Temp:  [97.4 F (36.3 C)-98.6 F (37 C)] 97.6 F (36.4 C) (12/15 0742) Pulse Rate:  [60-117] 84 (12/15 0700) Resp:  [16-31] 21 (12/15 0700) BP: (120-167)/(77-122) 143/100 mmHg (12/15 0700) SpO2:  [93 %-99 %] 96 % (12/15 0700) Weight:  [88.8 kg (195 lb 12.3 oz)] 88.8 kg (195 lb 12.3 oz) (12/15 0500) Weight change: 3.3 kg (7 lb 4.4 oz) Last BM Date: 07/14/15  Intake/Output from previous day: 12/14 0701 - 12/15 0700 In: 1473.8 [P.O.:240; I.V.:1133.8; IV Piggyback:100] Out: -   PHYSICAL EXAM General appearance: alert, cooperative and no distress Resp: clear to auscultation bilaterally Cardio: regular rate and rhythm, S1, S2 normal, no murmur, click, rub or gallop GI: soft, non-tender; bowel sounds normal; no masses,  no organomegaly Extremities: extremities normal, atraumatic, no cyanosis or edema  Lab Results:  Results for orders placed or performed during the hospital encounter of 07/10/15 (from the past 48 hour(s))  Creatinine, serum     Status: None   Collection Time: 07/17/15  4:23 AM  Result Value Ref Range   Creatinine, Ser 0.74 0.61 - 1.24 mg/dL   GFR calc non Af Amer >60 >60 mL/min   GFR calc Af Amer >60 >60 mL/min    Comment: (NOTE) The eGFR has been calculated using the CKD EPI equation. This calculation has not been validated in all clinical situations. eGFR's persistently <60 mL/min signify possible Chronic Kidney Disease.     ABGS No results for input(s): PHART, PO2ART, TCO2, HCO3 in the last 72 hours.  Invalid input(s): PCO2 CULTURES Recent Results (from the past 240 hour(s))  C difficile quick scan w PCR reflex     Status: None   Collection Time: 07/11/15 12:50 PM  Result Value Ref Range Status   C Diff antigen NEGATIVE NEGATIVE Final   C Diff toxin NEGATIVE NEGATIVE Final   C Diff  interpretation Negative for toxigenic C. difficile  Final  MRSA PCR Screening     Status: None   Collection Time: 07/13/15  5:00 AM  Result Value Ref Range Status   MRSA by PCR NEGATIVE NEGATIVE Final    Comment:        The GeneXpert MRSA Assay (FDA approved for NASAL specimens only), is one component of a comprehensive MRSA colonization surveillance program. It is not intended to diagnose MRSA infection nor to guide or monitor treatment for MRSA infections.    Studies/Results: No results found.  Medications:  Prior to Admission:  Prescriptions prior to admission  Medication Sig Dispense Refill Last Dose  . atorvastatin (LIPITOR) 20 MG tablet Take 20 mg by mouth daily.   Past Week at Unknown time  . BREO ELLIPTA 100-25 MCG/INH AEPB Inhale 1 Dose into the lungs as needed (shortness of breath).   12 Past Week at Unknown time  . cephALEXin (KEFLEX) 500 MG capsule Take 500 mg by mouth 2 (two) times daily.  0 07/10/2015 at Unknown time  . OVER THE COUNTER MEDICATION Take 1 scoop by mouth at bedtime. Oxy-powder (colon cleanser).   Past Week at Unknown time  . pantoprazole (PROTONIX) 40 MG tablet Take 40 mg by mouth. Takes every 2 days  0 Past Week at Unknown time  . PROAIR HFA 108 (90 BASE) MCG/ACT inhaler Inhale 2 Inhalers into the lungs every 6 (six) hours as needed for wheezing  or shortness of breath.   2 Past Week at Unknown time  . [DISCONTINUED] clonazePAM (KLONOPIN) 1 MG tablet Take 1 mg by mouth daily.  5 07/10/2015 at Unknown time   Scheduled: . atorvastatin  20 mg Oral Daily  . enoxaparin (LOVENOX) injection  40 mg Subcutaneous Q24H  . folic acid  1 mg Oral Daily  . guaiFENesin  600 mg Oral BID  . levofloxacin (LEVAQUIN) IV  500 mg Intravenous Q24H  . multivitamin with minerals  1 tablet Oral Daily  . pantoprazole  40 mg Oral Daily  . sodium chloride  3 mL Intravenous Q12H  . thiamine  100 mg Oral Daily   Continuous: . sodium chloride 50 mL/hr at 07/17/15 0600  .  dexmedetomidine Stopped (07/16/15 1000)   VCB:SWHQPRFFMBWGY **OR** acetaminophen, albuterol, fentaNYL (SUBLIMAZE) injection, hydrALAZINE, HYDROcodone-acetaminophen, LORazepam, ondansetron **OR** ondansetron (ZOFRAN) IV, traZODone  Assesment: He was admitted with COPD exacerbation. He was dehydrated on admission and that's better. His COPD is better. He developed delirium tremens from alcohol withdrawal and that has improved. He has agreed to outpatient alcohol treatment. He is back to baseline now. Active Problems:   COPD (chronic obstructive pulmonary disease) with acute bronchitis (HCC)   Malnutrition of moderate degree   Chronic alcoholism ([place])   Alcohol withdrawal delirium ([place])   Dehydration    Plan: Discharge home    LOS: 7 days   Adam Jordan 07/17/2015, 8:46 AM

## 2015-07-17 NOTE — Care Management Note (Signed)
Case Management Note  Patient Details  Name: Adam Jordan MRN: [id number] Date of Birth: 1945-10-31  Pt discharging home today with self care. Rollator had been delivered at the time of discharge. No CM needs.   Expected Discharge Date:    07/17/2015              Expected Discharge Plan:  Home/Self Care  In-House Referral:  NA  Discharge planning Services  CM Consult  Post Acute Care Choice:  Durable Medical Equipment Choice offered to:     DME Arranged:  Walker rolling with seat DME Agency:  [place]:    [facility] Agency:     Status of Service:  Completed, signed off  Medicare Important Message Given:  Yes Date Medicare IM Given:    Medicare IM give by:    Date Additional Medicare IM Given:    Additional Medicare Important Message give by:     If discussed at [place] of Stay Meetings, dates discussed:    Additional Comments:  Sherald Barge, RN 07/17/2015, 12:33 PM

## 2015-07-19 NOTE — Discharge Summary (Signed)
NAME:  Adam Jordan, Adam Jordan                ACCOUNT NO.:  [id number]  MEDICAL RECORD NO.:  [id number]  LOCATION:  IC10                          FACILITY:  [facility]  PHYSICIAN:  Azaylah Stailey L. Luan Pulling, M.D.DATE OF BIRTH:  [birth year]  DATE OF ADMISSION:  07/10/2015 DATE OF DISCHARGE:  12/15/2016LH                              DISCHARGE SUMMARY   ADDENDUM:  DISCHARGE DIAGNOSES:  Altered mental status secondary to delirium tremens.     Alivia Cimino L. Luan Pulling, M.D.     ELH/MEDQ  D:  07/19/2015  T:  07/19/2015  Job:  [id number]

## 2016-01-07 ENCOUNTER — Other Ambulatory Visit: Payer: Self-pay | Admitting: Orthopedic Surgery

## 2016-01-26 ENCOUNTER — Other Ambulatory Visit: Payer: Self-pay | Admitting: Orthopedic Surgery

## 2016-02-25 NOTE — H&P (Signed)
TOTAL KNEE ADMISSION H&P  Patient is being admitted for left total knee arthroplasty.  Subjective:  Chief Complaint:left knee pain.  HPI: Adam Jordan, 70 y.o. male, has a history of pain and functional disability in the left knee due to arthritis and has failed non-surgical conservative treatments for greater than 12 weeks to includeNSAID's and/or analgesics, corticosteriod injections, viscosupplementation injections, use of assistive devices and weight reduction as appropriate.  Onset of symptoms was gradual, starting 7 years ago with gradually worsening course since that time. The patient noted no past surgery on the left knee(s).  Patient currently rates pain in the left knee(s) at 5 out of 10 with activity. Patient has.  Patient has evidence of subchondral cysts, periarticular osteophytes and joint space narrowing by imaging studies. There is no active infection.  Patient Active Problem List   Diagnosis Date Noted  . Malnutrition of moderate degree 07/12/2015  . Chronic alcoholism ([place]) 07/12/2015  . Alcohol withdrawal delirium ([place]) 07/12/2015  . Dehydration 07/12/2015  . COPD (chronic obstructive pulmonary disease) with acute bronchitis ([place]) 07/10/2015   Past Medical History:  Diagnosis Date  . COPD (chronic obstructive pulmonary disease) ([place])   . GERD (gastroesophageal reflux disease)     Past Surgical History:  Procedure Laterality Date  . KNEE SURGERY    . thumb surgery    . TOTAL SHOULDER REPLACEMENT      No prescriptions prior to admission.   Allergies  Allergen Reactions  . Losartan Anaphylaxis  . Peanut-Containing Drug Products Anaphylaxis  . Other     bp medication that started with "L"  Says was not lisinopril    Social History  Substance Use Topics  . Smoking status: Current Every Day Smoker  . Smokeless tobacco: Not on file  . Alcohol use Yes     Comment: occ    No family history on file.   Review of Systems  Constitutional: Negative.   HENT:  Negative.   Eyes: Positive for blurred vision.  Respiratory: Negative.   Cardiovascular: Negative.   Gastrointestinal: Negative.   Genitourinary: Negative.   Musculoskeletal: Positive for back pain, joint pain and myalgias.  Skin: Negative.   Neurological: Negative.   Endo/Heme/Allergies: Negative.   Psychiatric/Behavioral: Positive for depression. The patient is nervous/anxious.     Objective:  Physical Exam  Constitutional: He is oriented to person, place, and time. He appears well-developed.  HENT:  Head: Normocephalic.  Neck: Normal range of motion.  Cardiovascular: Normal rate, normal heart sounds and intact distal pulses.   Respiratory: Effort normal.  GI: Soft.  Genitourinary:  Genitourinary Comments: Deferred  Musculoskeletal: Normal range of motion.  Neurological: He is alert and oriented to person, place, and time. He has normal reflexes.  Skin: Skin is warm and dry.  Psychiatric: His behavior is normal.    Vital signs in last 24 hours: BP: ()/()  Arterial Line BP: ()/()   Labs:   Estimated body mass index is 29.77 kg/m as calculated from the following:   Height as of 07/13/15: '5\' 8"'$  (1.727 m).   Weight as of 07/17/15: 88.8 kg (195 lb 12.3 oz).   Imaging Review Plain radiographs demonstrate severe degenerative joint disease of the left knee(s). The overall alignment ismild varus. The bone quality appears to be good for age and reported activity level.  Assessment/Plan:  End stage arthritis, left knee   The patient history, physical examination, clinical judgment of the provider and imaging studies are consistent with end stage degenerative joint disease  of the left knee(s) and total knee arthroplasty is deemed medically necessary. The treatment options including medical management, injection therapy arthroscopy and arthroplasty were discussed at length. The risks and benefits of total knee arthroplasty were presented and reviewed. The risks due to aseptic  loosening, infection, stiffness, patella tracking problems, thromboembolic complications and other imponderables were discussed. The patient acknowledged the explanation, agreed to proceed with the plan and consent was signed. Patient is being admitted for inpatient treatment for surgery, pain control, PT, OT, prophylactic antibiotics, VTE prophylaxis, progressive ambulation and ADL's and discharge planning. The patient is planning to be discharged home with home health services.  Will use IV tranexamic acid. Contraindications and adverse affects of Tranexamic acid discussed in detail. Patient denies any of these at this time and understands the risks and benefits.

## 2016-03-16 ENCOUNTER — Encounter (HOSPITAL_COMMUNITY)
Admission: RE | Admit: 2016-03-16 | Discharge: 2016-03-16 | Disposition: A | Discharge status: Home / Self Care | Payer: Medicare Other | Source: Ambulatory Visit | Attending: Specialist | Admitting: Specialist

## 2016-03-16 ENCOUNTER — Encounter (HOSPITAL_COMMUNITY): Payer: Self-pay

## 2016-03-16 HISTORY — DX: Anxiety disorder, unspecified: F41.9

## 2016-03-16 HISTORY — DX: Unspecified osteoarthritis, unspecified site: M19.90

## 2016-03-16 HISTORY — DX: Myoneural disorder, unspecified: G70.9

## 2016-03-16 LAB — PROTIME-INR
INR: 0.96
PROTHROMBIN TIME: 12.7 s (ref 11.4–15.2)

## 2016-03-16 LAB — CBC
HCT: 46.6 % (ref 39.0–52.0)
HEMOGLOBIN: 15.8 g/dL (ref 13.0–17.0)
MCH: 31.3 pg (ref 26.0–34.0)
MCHC: 33.9 g/dL (ref 30.0–36.0)
MCV: 92.3 fL (ref 78.0–100.0)
Platelets: 198 10*3/uL (ref 150–400)
RBC: 5.05 MIL/uL (ref 4.22–5.81)
RDW: 13.4 % (ref 11.5–15.5)
WBC: 8.6 10*3/uL (ref 4.0–10.5)

## 2016-03-16 LAB — SURGICAL PCR SCREEN
MRSA, PCR: NEGATIVE
Staphylococcus aureus: NEGATIVE

## 2016-03-16 LAB — APTT: aPTT: 36 seconds (ref 24–36)

## 2016-03-16 LAB — URINALYSIS, ROUTINE W REFLEX MICROSCOPIC
Bilirubin Urine: NEGATIVE
Glucose, UA: NEGATIVE mg/dL
HGB URINE DIPSTICK: NEGATIVE
Ketones, ur: NEGATIVE mg/dL
Leukocytes, UA: NEGATIVE
NITRITE: NEGATIVE
PROTEIN: NEGATIVE mg/dL
SPECIFIC GRAVITY, URINE: 1.013 (ref 1.005–1.030)
pH: 6.5 (ref 5.0–8.0)

## 2016-03-16 LAB — BASIC METABOLIC PANEL
ANION GAP: 6 (ref 5–15)
BUN: 17 mg/dL (ref 6–20)
CHLORIDE: 104 mmol/L (ref 101–111)
CO2: 27 mmol/L (ref 22–32)
Calcium: 9.3 mg/dL (ref 8.9–10.3)
Creatinine, Ser: 0.98 mg/dL (ref 0.61–1.24)
GFR calc non Af Amer: >60 mL/min (ref >60)
Glucose, Bld: 86 mg/dL (ref 65–99)
Potassium: 4.5 mmol/L (ref 3.5–5.1)
Sodium: 137 mmol/L (ref 135–145)

## 2016-03-16 LAB — ABO/RH: ABO/RH(D): O POS

## 2016-03-16 NOTE — Pre-Procedure Instructions (Signed)
Clearance note with chart Dr. Luan Pulling.

## 2016-03-16 NOTE — Patient Instructions (Signed)
Adam Jordan  03/16/2016   Your procedure is scheduled on: 03-19-16   Report to [facility]  Entrance take [facility]  elevators to 3rd floor to  [place] at 1100  AM.  Call this number if you have problems the morning of surgery [phone]   Remember: ONLY 1 PERSON MAY GO WITH YOU TO SHORT STAY TO GET  READY MORNING OF Adam Jordan.  Do not eat food or drink liquids :After Midnight.     Take these medicines the morning of surgery with A SIP OF WATER: none- Inhalers-if need. DO NOT TAKE ANY DIABETIC MEDICATIONS DAY OF YOUR SURGERY                               You may not have any metal on your body including hair pins and              piercings  Do not wear jewelry, make-up, lotions, powders or perfumes, deodorant             Do not wear nail polish.  Do not shave  48 hours prior to surgery.              Men may shave face and neck.   Do not bring valuables to the hospital. [place].  Contacts, dentures or bridgework may not be worn into surgery.  Leave suitcase in the car. After surgery it may be brought to your room.     Patients discharged the day of surgery will not be allowed to drive home.  Name and phone number of your driver:spouse Adam Jordan cell  Special Instructions: N/A              Please read over the following fact sheets you were given: _____________________________________________________________________             [facility] - Preparing for Surgery Before surgery, you can play an important role.  Because skin is not sterile, your skin needs to be as free of germs as possible.  You can reduce the number of germs on your skin by washing with CHG (chlorahexidine gluconate) soap before surgery.  CHG is an antiseptic cleaner which kills germs and bonds with the skin to continue killing germs even after washing. Please DO NOT use if you have an allergy to CHG or  antibacterial soaps.  If your skin becomes reddened/irritated stop using the CHG and inform your nurse when you arrive at Short Stay. Do not shave (including legs and underarms) for at least 48 hours prior to the first CHG shower.  You may shave your face/neck. Please follow these instructions carefully:  1.  Shower with CHG Soap the night before surgery and the  morning of Surgery.  2.  If you choose to wash your hair, wash your hair first as usual with your  normal  shampoo.  3.  After you shampoo, rinse your hair and body thoroughly to remove the  shampoo.                           4.  Use CHG as you would any other liquid soap.  You can apply chg  directly  to the skin and wash                       Gently with a scrungie or clean washcloth.  5.  Apply the CHG Soap to your body ONLY FROM THE NECK DOWN.   Do not use on face/ open                           Wound or open sores. Avoid contact with eyes, ears mouth and genitals (private parts).                       Wash face,  Genitals (private parts) with your normal soap.             6.  Wash thoroughly, paying special attention to the area where your surgery  will be performed.  7.  Thoroughly rinse your body with warm water from the neck down.  8.  DO NOT shower/wash with your normal soap after using and rinsing off  the CHG Soap.                9.  Pat yourself dry with a clean towel.            10.  Wear clean pajamas.            11.  Place clean sheets on your bed the night of your first shower and do not  sleep with pets. Day of Surgery : Do not apply any lotions/deodorants the morning of surgery.  Please wear clean clothes to the hospital/surgery center.  FAILURE TO FOLLOW THESE INSTRUCTIONS MAY RESULT IN THE CANCELLATION OF YOUR SURGERY PATIENT SIGNATURE_________________________________  NURSE SIGNATURE__________________________________  ________________________________________________________________________   Adam Jordan  An incentive spirometer is a tool that can help keep your lungs clear and active. This tool measures how well you are filling your lungs with each breath. Taking long deep breaths may help reverse or decrease the chance of developing breathing (pulmonary) problems (especially infection) following:  A long period of time when you are unable to move or be active. BEFORE THE PROCEDURE   If the spirometer includes an indicator to show your best effort, your nurse or respiratory therapist will set it to a desired goal.  If possible, sit up straight or lean slightly forward. Try not to slouch.  Hold the incentive spirometer in an upright position. INSTRUCTIONS FOR USE  1. Sit on the edge of your bed if possible, or sit up as far as you can in bed or on a chair. 2. Hold the incentive spirometer in an upright position. 3. Breathe out normally. 4. Place the mouthpiece in your mouth and seal your lips tightly around it. 5. Breathe in slowly and as deeply as possible, raising the piston or the ball toward the top of the column. 6. Hold your breath for 3-5 seconds or for as long as possible. Allow the piston or ball to fall to the bottom of the column. 7. Remove the mouthpiece from your mouth and breathe out normally. 8. Rest for a few seconds and repeat Steps 1 through 7 at least 10 times every 1-2 hours when you are awake. Take your time and take a few normal breaths between deep breaths. 9. The spirometer may include an indicator to show your best effort. Use the indicator as a goal to work toward during each repetition. 10.  After each set of 10 deep breaths, practice coughing to be sure your lungs are clear. If you have an incision (the cut made at the time of surgery), support your incision when coughing by placing a pillow or rolled up towels firmly against it. Once you are able to get out of bed, walk around indoors and cough well. You may stop using the incentive spirometer when  instructed by your caregiver.  RISKS AND COMPLICATIONS  Take your time so you do not get dizzy or light-headed.  If you are in pain, you may need to take or ask for pain medication before doing incentive spirometry. It is harder to take a deep breath if you are having pain. AFTER USE  Rest and breathe slowly and easily.  It can be helpful to keep track of a log of your progress. Your caregiver can provide you with a simple table to help with this. If you are using the spirometer at home, follow these instructions: [place] IF:   You are having difficultly using the spirometer.  You have trouble using the spirometer as often as instructed.  Your pain medication is not giving enough relief while using the spirometer.  You develop fever of 100.5 F (38.1 C) or higher. SEEK IMMEDIATE MEDICAL CARE IF:   You cough up bloody sputum that had not been present before.  You develop fever of 102 F (38.9 C) or greater.  You develop worsening pain at or near the incision site. MAKE SURE YOU:   Understand these instructions.  Will watch your condition.  Will get help right away if you are not doing well or get worse. Document Released: 11/29/2006 Document Revised: 10/11/2011 Document Reviewed: 01/30/2007 ExitCare Patient Information 2014 ExitCare, [place].   ________________________________________________________________________  WHAT IS A BLOOD TRANSFUSION? Blood Transfusion Information  A transfusion is the replacement of blood or some of its parts. Blood is made up of multiple cells which provide different functions.  Red blood cells carry oxygen and are used for blood loss replacement.  White blood cells fight against infection.  Platelets control bleeding.  Plasma helps clot blood.  Other blood products are available for specialized needs, such as hemophilia or other clotting disorders. BEFORE THE TRANSFUSION  Who gives blood for transfusions?   Healthy  volunteers who are fully evaluated to make sure their blood is safe. This is blood bank blood. Transfusion therapy is the safest it has ever been in the practice of medicine. Before blood is taken from a donor, a complete history is taken to make sure that person has no history of diseases nor engages in risky social behavior (examples are intravenous drug use or sexual activity with multiple partners). The donor's travel history is screened to minimize risk of transmitting infections, such as malaria. The donated blood is tested for signs of infectious diseases, such as HIV and hepatitis. The blood is then tested to be sure it is compatible with you in order to minimize the chance of a transfusion reaction. If you or a relative donates blood, this is often done in anticipation of surgery and is not appropriate for emergency situations. It takes many days to process the donated blood. RISKS AND COMPLICATIONS Although transfusion therapy is very safe and saves many lives, the main dangers of transfusion include:   Getting an infectious disease.  Developing a transfusion reaction. This is an allergic reaction to something in the blood you were given. Every precaution is taken to prevent this. The decision  to have a blood transfusion has been considered carefully by your caregiver before blood is given. Blood is not given unless the benefits outweigh the risks. AFTER THE TRANSFUSION  Right after receiving a blood transfusion, you will usually feel much better and more energetic. This is especially true if your red blood cells have gotten low (anemic). The transfusion raises the level of the red blood cells which carry oxygen, and this usually causes an energy increase.  The nurse administering the transfusion will monitor you carefully for complications. HOME CARE INSTRUCTIONS  No special instructions are needed after a transfusion. You may find your energy is better. Speak with your caregiver about any  limitations on activity for underlying diseases you may have. SEEK MEDICAL CARE IF:   Your condition is not improving after your transfusion.  You develop redness or irritation at the intravenous (IV) site. SEEK IMMEDIATE MEDICAL CARE IF:  Any of the following symptoms occur over the next 12 hours:  Shaking chills.  You have a temperature by mouth above 102 F (38.9 C), not controlled by medicine.  Chest, back, or muscle pain.  People around you feel you are not acting correctly or are confused.  Shortness of breath or difficulty breathing.  Dizziness and fainting.  You get a rash or develop hives.  You have a decrease in urine output.  Your urine turns a dark color or changes to pink, red, or brown. Any of the following symptoms occur over the next 10 days:  You have a temperature by mouth above 102 F (38.9 C), not controlled by medicine.  Shortness of breath.  Weakness after normal activity.  The white part of the eye turns yellow (jaundice).  You have a decrease in the amount of urine or are urinating less often.  Your urine turns a dark color or changes to pink, red, or brown. Document Released: 07/16/2000 Document Revised: 10/11/2011 Document Reviewed: 03/04/2008 [facility] Patient Information 2014 [place], [place].  _______________________________________________________________________

## 2016-03-16 NOTE — Pre-Procedure Instructions (Signed)
EKG/CXR 12'16 Epic.

## 2016-03-18 NOTE — Anesthesia Preprocedure Evaluation (Addendum)
Anesthesia Evaluation  Patient identified by MRN, date of birth, ID band Patient awake    Reviewed: Allergy & Precautions, NPO status , Patient's Chart, lab work & pertinent test results  Airway Mallampati: II  TM Distance: >3 FB Neck ROM: Full    Dental no notable dental hx.    Pulmonary COPD,  COPD inhaler, Current Smoker,    Pulmonary exam normal        Cardiovascular negative cardio ROS Normal cardiovascular exam     Neuro/Psych PSYCHIATRIC DISORDERS Anxiety negative neurological ROS     GI/Hepatic GERD  ,(+)     substance abuse (history of DTs requiring hospitalization in december 2016)  alcohol use,   Endo/Other    Renal/GU      Musculoskeletal  (+) Arthritis , Osteoarthritis,    Abdominal   Peds negative pediatric ROS (+)  Hematology   Anesthesia Other Findings   Reproductive/Obstetrics                            Anesthesia Physical Anesthesia Plan  ASA: II  Anesthesia Plan: Spinal and Regional   Post-op Pain Management:  Regional for Post-op pain   Induction:   Airway Management Planned: Nasal Cannula  Additional Equipment: None  Intra-op Plan:   Post-operative Plan:   Informed Consent:   Plan Discussed with:   Anesthesia Plan Comments:         Anesthesia Quick Evaluation

## 2016-03-19 ENCOUNTER — Inpatient Hospital Stay (HOSPITAL_COMMUNITY): Payer: Medicare Other | Admitting: Anesthesiology

## 2016-03-19 ENCOUNTER — Encounter (HOSPITAL_COMMUNITY)
Admission: RE | Disposition: A | Discharge status: Home Health | Payer: Self-pay | Source: Ambulatory Visit | Attending: Specialist

## 2016-03-19 ENCOUNTER — Inpatient Hospital Stay (HOSPITAL_COMMUNITY)
Admission: RE | Admit: 2016-03-19 | Discharge: 2016-03-21 | DRG: 470 | Disposition: A | Discharge status: Home Health | Payer: Medicare Other | Source: Ambulatory Visit | Attending: Specialist | Admitting: Specialist

## 2016-03-19 ENCOUNTER — Encounter (HOSPITAL_COMMUNITY): Payer: Self-pay | Admitting: *Deleted

## 2016-03-19 DIAGNOSIS — Z96659 Presence of unspecified artificial knee joint: Secondary | ICD-10-CM

## 2016-03-19 DIAGNOSIS — Z96619 Presence of unspecified artificial shoulder joint: Secondary | ICD-10-CM | POA: Diagnosis not present

## 2016-03-19 DIAGNOSIS — F17200 Nicotine dependence, unspecified, uncomplicated: Secondary | ICD-10-CM | POA: Diagnosis present

## 2016-03-19 DIAGNOSIS — F419 Anxiety disorder, unspecified: Secondary | ICD-10-CM | POA: Diagnosis not present

## 2016-03-19 DIAGNOSIS — K219 Gastro-esophageal reflux disease without esophagitis: Secondary | ICD-10-CM | POA: Diagnosis not present

## 2016-03-19 DIAGNOSIS — Z96652 Presence of left artificial knee joint: Secondary | ICD-10-CM

## 2016-03-19 DIAGNOSIS — M1712 Unilateral primary osteoarthritis, left knee: Secondary | ICD-10-CM | POA: Diagnosis not present

## 2016-03-19 DIAGNOSIS — J449 Chronic obstructive pulmonary disease, unspecified: Secondary | ICD-10-CM | POA: Diagnosis present

## 2016-03-19 DIAGNOSIS — M25562 Pain in left knee: Secondary | ICD-10-CM | POA: Diagnosis present

## 2016-03-19 HISTORY — PX: TOTAL KNEE ARTHROPLASTY: SHX125

## 2016-03-19 HISTORY — DX: Presence of unspecified artificial knee joint: Z96.659

## 2016-03-19 LAB — TYPE AND SCREEN
ABO/RH(D): O POS
ANTIBODY SCREEN: NEGATIVE

## 2016-03-19 SURGERY — ARTHROPLASTY, KNEE, TOTAL
Anesthesia: Regional | Site: Knee | Laterality: Left

## 2016-03-19 MED ORDER — KETOROLAC TROMETHAMINE 30 MG/ML IJ SOLN
INTRAMUSCULAR | Status: DC | PRN
  Administered 2016-03-19: 30 mg via INTRAVENOUS

## 2016-03-19 MED ORDER — ADULT MULTIVITAMIN W/MINERALS CH
1.0000 | ORAL_TABLET | Freq: Every day | ORAL | Status: DC
  Administered 2016-03-20 – 2016-03-21 (×2): 1 via ORAL
  Filled 2016-03-19 (×2): qty 1

## 2016-03-19 MED ORDER — SODIUM CHLORIDE 0.9 % IJ SOLN
INTRAMUSCULAR | Status: DC | PRN
  Administered 2016-03-19: 30 mL via INTRAVENOUS

## 2016-03-19 MED ORDER — OXYMETAZOLINE HCL 0.05 % NA SOLN: 1.0000 | Freq: Two times a day (BID) | NASAL | Status: DC | PRN

## 2016-03-19 MED ORDER — DEXAMETHASONE SODIUM PHOSPHATE 10 MG/ML IJ SOLN
INTRAMUSCULAR | Status: AC
  Filled 2016-03-19: qty 1

## 2016-03-19 MED ORDER — CEFAZOLIN SODIUM-DEXTROSE 2-4 GM/100ML-% IV SOLN
INTRAVENOUS | Status: AC
  Filled 2016-03-19: qty 100

## 2016-03-19 MED ORDER — BUPIVACAINE HCL (PF) 0.5 % IJ SOLN
INTRAMUSCULAR | Status: DC | PRN
  Administered 2016-03-19: 25 mL

## 2016-03-19 MED ORDER — METHOCARBAMOL 500 MG PO TABS
500.0000 mg | ORAL_TABLET | Freq: Four times a day (QID) | ORAL | Status: DC | PRN
  Administered 2016-03-19 – 2016-03-21 (×6): 500 mg via ORAL
  Filled 2016-03-19 (×6): qty 1

## 2016-03-19 MED ORDER — METOCLOPRAMIDE HCL 5 MG/ML IJ SOLN: 5.0000 mg | Freq: Three times a day (TID) | INTRAMUSCULAR | Status: DC | PRN

## 2016-03-19 MED ORDER — ACETAMINOPHEN 650 MG RE SUPP: 650.0000 mg | Freq: Four times a day (QID) | RECTAL | Status: DC | PRN

## 2016-03-19 MED ORDER — PHENYLEPHRINE HCL 10 MG/ML IJ SOLN
INTRAVENOUS | Status: DC | PRN
  Administered 2016-03-19: 10 ug/min via INTRAVENOUS

## 2016-03-19 MED ORDER — ALBUTEROL SULFATE (2.5 MG/3ML) 0.083% IN NEBU: 2.5000 mg | INHALATION_SOLUTION | Freq: Four times a day (QID) | RESPIRATORY_TRACT | Status: DC | PRN

## 2016-03-19 MED ORDER — PHENOL 1.4 % MT LIQD: 1.0000 | OROMUCOSAL | Status: DC | PRN

## 2016-03-19 MED ORDER — MONTELUKAST SODIUM 10 MG PO TABS
10.0000 mg | ORAL_TABLET | Freq: Every day | ORAL | Status: DC
  Administered 2016-03-20 – 2016-03-21 (×2): 10 mg via ORAL
  Filled 2016-03-19 (×2): qty 1

## 2016-03-19 MED ORDER — POVIDONE-IODINE 7.5 % EX SOLN: Freq: Once | CUTANEOUS | Status: DC

## 2016-03-19 MED ORDER — METOCLOPRAMIDE HCL 5 MG PO TABS: 5.0000 mg | ORAL_TABLET | Freq: Three times a day (TID) | ORAL | Status: DC | PRN

## 2016-03-19 MED ORDER — ALBUTEROL SULFATE HFA 108 (90 BASE) MCG/ACT IN AERS
2.0000 | INHALATION_SPRAY | Freq: Four times a day (QID) | RESPIRATORY_TRACT | Status: DC | PRN
Start: 2016-03-19 — End: 2016-03-19

## 2016-03-19 MED ORDER — DEXAMETHASONE SODIUM PHOSPHATE 10 MG/ML IJ SOLN
10.0000 mg | Freq: Once | INTRAMUSCULAR | Status: AC
  Administered 2016-03-19: 10 mg via INTRAVENOUS

## 2016-03-19 MED ORDER — KETOROLAC TROMETHAMINE 30 MG/ML IJ SOLN
INTRAMUSCULAR | Status: AC
  Filled 2016-03-19: qty 1

## 2016-03-19 MED ORDER — CLONAZEPAM 1 MG PO TABS: 1.0000 mg | ORAL_TABLET | Freq: Two times a day (BID) | ORAL | Status: DC | PRN

## 2016-03-19 MED ORDER — CEFAZOLIN SODIUM-DEXTROSE 2-4 GM/100ML-% IV SOLN
2.0000 g | Freq: Four times a day (QID) | INTRAVENOUS | Status: AC
  Administered 2016-03-19 – 2016-03-20 (×2): 2 g via INTRAVENOUS
  Filled 2016-03-19 (×2): qty 100

## 2016-03-19 MED ORDER — BUPIVACAINE-EPINEPHRINE 0.25% -1:200000 IJ SOLN
INTRAMUSCULAR | Status: DC | PRN
  Administered 2016-03-19: 30 mL

## 2016-03-19 MED ORDER — FENTANYL CITRATE (PF) 100 MCG/2ML IJ SOLN
INTRAMUSCULAR | Status: AC
  Filled 2016-03-19: qty 2

## 2016-03-19 MED ORDER — EPHEDRINE SULFATE 50 MG/ML IJ SOLN
INTRAMUSCULAR | Status: DC | PRN
  Administered 2016-03-19: 5 mg via INTRAVENOUS

## 2016-03-19 MED ORDER — SODIUM CHLORIDE 0.9 % IJ SOLN
INTRAMUSCULAR | Status: AC
  Filled 2016-03-19: qty 50

## 2016-03-19 MED ORDER — POLYETHYLENE GLYCOL 3350 17 G PO PACK: 17.0000 g | PACK | Freq: Every day | ORAL | Status: DC | PRN

## 2016-03-19 MED ORDER — METHOCARBAMOL 1000 MG/10ML IJ SOLN
500.0000 mg | Freq: Four times a day (QID) | INTRAVENOUS | Status: DC | PRN
  Filled 2016-03-19: qty 5

## 2016-03-19 MED ORDER — PROPOFOL 500 MG/50ML IV EMUL
INTRAVENOUS | Status: DC | PRN
  Administered 2016-03-19: 100 ug/kg/min via INTRAVENOUS

## 2016-03-19 MED ORDER — ACETAMINOPHEN 325 MG PO TABS: 650.0000 mg | ORAL_TABLET | Freq: Four times a day (QID) | ORAL | Status: DC | PRN

## 2016-03-19 MED ORDER — ONDANSETRON HCL 4 MG PO TABS: 4.0000 mg | ORAL_TABLET | Freq: Four times a day (QID) | ORAL | Status: DC | PRN

## 2016-03-19 MED ORDER — OXYCODONE HCL 5 MG PO TABS
5.0000 mg | ORAL_TABLET | ORAL | Status: DC | PRN
  Administered 2016-03-19 – 2016-03-21 (×9): 10 mg via ORAL
  Filled 2016-03-19 (×9): qty 2

## 2016-03-19 MED ORDER — LACTATED RINGERS IV SOLN
INTRAVENOUS | Status: DC
  Administered 2016-03-19 (×2): via INTRAVENOUS

## 2016-03-19 MED ORDER — ZOLPIDEM TARTRATE 5 MG PO TABS: 5.0000 mg | ORAL_TABLET | Freq: Every evening | ORAL | Status: DC | PRN

## 2016-03-19 MED ORDER — MIDAZOLAM HCL 5 MG/5ML IJ SOLN
INTRAMUSCULAR | Status: DC | PRN
  Administered 2016-03-19: 2 mg via INTRAVENOUS

## 2016-03-19 MED ORDER — LIDOCAINE HCL (CARDIAC) 20 MG/ML IV SOLN
INTRAVENOUS | Status: AC
  Filled 2016-03-19: qty 5

## 2016-03-19 MED ORDER — ASPIRIN EC 325 MG PO TBEC: 325.0000 mg | DELAYED_RELEASE_TABLET | Freq: Two times a day (BID) | ORAL | 0 refills | Status: DC

## 2016-03-19 MED ORDER — ALUM & MAG HYDROXIDE-SIMETH 200-200-20 MG/5ML PO SUSP: 30.0000 mL | ORAL | Status: DC | PRN

## 2016-03-19 MED ORDER — DIPHENHYDRAMINE HCL 12.5 MG/5ML PO ELIX: 12.5000 mg | ORAL_SOLUTION | ORAL | Status: DC | PRN

## 2016-03-19 MED ORDER — BUPIVACAINE-EPINEPHRINE (PF) 0.5% -1:200000 IJ SOLN
INTRAMUSCULAR | Status: AC
  Filled 2016-03-19: qty 30

## 2016-03-19 MED ORDER — CEFAZOLIN SODIUM-DEXTROSE 2-4 GM/100ML-% IV SOLN
2.0000 g | INTRAVENOUS | Status: AC
  Administered 2016-03-19: 2 g via INTRAVENOUS

## 2016-03-19 MED ORDER — FENTANYL CITRATE (PF) 100 MCG/2ML IJ SOLN
INTRAMUSCULAR | Status: DC | PRN
  Administered 2016-03-19: 50 ug via INTRAVENOUS

## 2016-03-19 MED ORDER — ONDANSETRON HCL 4 MG/2ML IJ SOLN
INTRAMUSCULAR | Status: DC | PRN
  Administered 2016-03-19: 4 mg via INTRAVENOUS

## 2016-03-19 MED ORDER — DOCUSATE SODIUM 100 MG PO CAPS
100.0000 mg | ORAL_CAPSULE | Freq: Two times a day (BID) | ORAL | Status: DC
  Administered 2016-03-19 – 2016-03-21 (×4): 100 mg via ORAL
  Filled 2016-03-19 (×4): qty 1

## 2016-03-19 MED ORDER — PROMETHAZINE HCL 25 MG/ML IJ SOLN: 6.2500 mg | INTRAMUSCULAR | Status: DC | PRN

## 2016-03-19 MED ORDER — BISACODYL 10 MG RE SUPP: 10.0000 mg | Freq: Every day | RECTAL | Status: DC | PRN

## 2016-03-19 MED ORDER — SODIUM CHLORIDE 0.9 % IR SOLN
Status: DC | PRN
  Administered 2016-03-19: 1000 mL

## 2016-03-19 MED ORDER — MENTHOL 3 MG MT LOZG: 1.0000 | LOZENGE | OROMUCOSAL | Status: DC | PRN

## 2016-03-19 MED ORDER — METHOCARBAMOL 500 MG PO TABS: 500.0000 mg | ORAL_TABLET | Freq: Three times a day (TID) | ORAL | 2 refills | Status: DC | PRN

## 2016-03-19 MED ORDER — PROPOFOL 10 MG/ML IV BOLUS
INTRAVENOUS | Status: AC
  Filled 2016-03-19: qty 20

## 2016-03-19 MED ORDER — OXYCODONE-ACETAMINOPHEN 5-325 MG PO TABS: 1.0000 | ORAL_TABLET | ORAL | 0 refills | Status: DC | PRN

## 2016-03-19 MED ORDER — MIDAZOLAM HCL 2 MG/2ML IJ SOLN
INTRAMUSCULAR | Status: AC
  Filled 2016-03-19: qty 2

## 2016-03-19 MED ORDER — ONDANSETRON HCL 4 MG/2ML IJ SOLN
INTRAMUSCULAR | Status: AC
  Filled 2016-03-19: qty 2

## 2016-03-19 MED ORDER — FERROUS SULFATE 325 (65 FE) MG PO TABS
325.0000 mg | ORAL_TABLET | Freq: Three times a day (TID) | ORAL | Status: DC
  Administered 2016-03-20 – 2016-03-21 (×3): 325 mg via ORAL
  Filled 2016-03-19 (×3): qty 1

## 2016-03-19 MED ORDER — BUPIVACAINE-EPINEPHRINE (PF) 0.25% -1:200000 IJ SOLN
INTRAMUSCULAR | Status: AC
  Filled 2016-03-19: qty 30

## 2016-03-19 MED ORDER — HYDROMORPHONE HCL 1 MG/ML IJ SOLN
1.0000 mg | INTRAMUSCULAR | Status: DC | PRN
  Administered 2016-03-20 (×2): 1 mg via INTRAVENOUS
  Filled 2016-03-19 (×2): qty 1

## 2016-03-19 MED ORDER — DEXAMETHASONE SODIUM PHOSPHATE 10 MG/ML IJ SOLN
10.0000 mg | Freq: Once | INTRAMUSCULAR | Status: AC
  Administered 2016-03-20: 10 mg via INTRAVENOUS
  Filled 2016-03-19: qty 1

## 2016-03-19 MED ORDER — ONDANSETRON HCL 4 MG/2ML IJ SOLN: 4.0000 mg | Freq: Four times a day (QID) | INTRAMUSCULAR | Status: DC | PRN

## 2016-03-19 MED ORDER — TRANEXAMIC ACID 1000 MG/10ML IV SOLN
1000.0000 mg | INTRAVENOUS | Status: AC
Start: 2016-03-19 — End: 2016-03-19
  Administered 2016-03-19: 1000 mg via INTRAVENOUS
  Filled 2016-03-19: qty 1100

## 2016-03-19 MED ORDER — TRAZODONE HCL 50 MG PO TABS: 50.0000 mg | ORAL_TABLET | Freq: Every evening | ORAL | Status: DC | PRN

## 2016-03-19 MED ORDER — PROPOFOL 10 MG/ML IV BOLUS
INTRAVENOUS | Status: AC
  Filled 2016-03-19: qty 40

## 2016-03-19 MED ORDER — SILDENAFIL CITRATE 20 MG PO TABS: 40.0000 mg | ORAL_TABLET | Freq: Every day | ORAL | Status: DC | PRN

## 2016-03-19 MED ORDER — ENOXAPARIN SODIUM 30 MG/0.3ML ~~LOC~~ SOLN
30.0000 mg | Freq: Two times a day (BID) | SUBCUTANEOUS | Status: DC
  Administered 2016-03-20 – 2016-03-21 (×3): 30 mg via SUBCUTANEOUS
  Filled 2016-03-19 (×3): qty 0.3

## 2016-03-19 MED ORDER — FENTANYL CITRATE (PF) 100 MCG/2ML IJ SOLN: 25.0000 ug | INTRAMUSCULAR | Status: DC | PRN

## 2016-03-19 SURGICAL SUPPLY — 57 items
BAG DECANTER FOR FLEXI CONT (MISCELLANEOUS) IMPLANT
BAG ZIPLOCK 12X15 (MISCELLANEOUS) ×4 IMPLANT
BANDAGE ACE 4X5 VEL STRL LF (GAUZE/BANDAGES/DRESSINGS) IMPLANT
BANDAGE ACE 6X5 VEL STRL LF (GAUZE/BANDAGES/DRESSINGS) IMPLANT
BLADE SAG 18X100X1.27 (BLADE) ×2 IMPLANT
BLADE SAW SGTL 13.0X1.19X90.0M (BLADE) ×2 IMPLANT
CAP KNEE TOTAL 3 SIGMA ×2 IMPLANT
CEMENT HV SMART SET (Cement) ×4 IMPLANT
CLOTH BEACON ORANGE TIMEOUT ST (SAFETY) ×2 IMPLANT
CUFF TOURN SGL QUICK 34 (TOURNIQUET CUFF) ×1
CUFF TRNQT CYL 34X4X40X1 (TOURNIQUET CUFF) ×1 IMPLANT
DECANTER SPIKE VIAL GLASS SM (MISCELLANEOUS) ×2 IMPLANT
DRAPE U-SHAPE 47X51 STRL (DRAPES) ×2 IMPLANT
DRSG AQUACEL AG ADV 3.5X10 (GAUZE/BANDAGES/DRESSINGS) ×2 IMPLANT
DRSG TEGADERM 4X4.75 (GAUZE/BANDAGES/DRESSINGS) IMPLANT
DURAPREP 26ML APPLICATOR (WOUND CARE) ×4 IMPLANT
ELECT REM PT RETURN 9FT ADLT (ELECTROSURGICAL) ×2
ELECTRODE REM PT RTRN 9FT ADLT (ELECTROSURGICAL) ×1 IMPLANT
EVACUATOR 1/8 PVC DRAIN (DRAIN) ×2 IMPLANT
GAUZE SPONGE 2X2 8PLY STRL LF (GAUZE/BANDAGES/DRESSINGS) ×1 IMPLANT
GLOVE BIOGEL PI IND STRL 8 (GLOVE) ×2 IMPLANT
GLOVE BIOGEL PI INDICATOR 8 (GLOVE) ×2
GLOVE ECLIPSE 8.0 STRL XLNG CF (GLOVE) ×4 IMPLANT
GLOVE SURG ORTHO 9.0 STRL STRW (GLOVE) ×2 IMPLANT
GLOVE SURG SS PI 7.5 STRL IVOR (GLOVE) ×2 IMPLANT
GOWN STRL REUS W/TWL XL LVL3 (GOWN DISPOSABLE) ×4 IMPLANT
HANDPIECE INTERPULSE COAX TIP (DISPOSABLE) ×1
IMMOBILIZER KNEE 20 (SOFTGOODS) ×2
IMMOBILIZER KNEE 20 THIGH 36 (SOFTGOODS) ×1 IMPLANT
LIQUID BAND (GAUZE/BANDAGES/DRESSINGS) IMPLANT
NS IRRIG 1000ML POUR BTL (IV SOLUTION) ×2 IMPLANT
PACK TOTAL KNEE CUSTOM (KITS) ×2 IMPLANT
POSITIONER SURGICAL ARM (MISCELLANEOUS) ×2 IMPLANT
SET HNDPC FAN SPRY TIP SCT (DISPOSABLE) ×1 IMPLANT
SET PAD KNEE POSITIONER (MISCELLANEOUS) ×2 IMPLANT
SPONGE GAUZE 2X2 STER 10/PKG (GAUZE/BANDAGES/DRESSINGS) ×1
SPONGE LAP 18X18 X RAY DECT (DISPOSABLE) IMPLANT
SPONGE SURGIFOAM ABS GEL 100 (HEMOSTASIS) ×2 IMPLANT
STOCKINETTE 6  STRL (DRAPES) ×1
STOCKINETTE 6 STRL (DRAPES) ×1 IMPLANT
SUCTION FRAZIER HANDLE 12FR (TUBING) ×1
SUCTION TUBE FRAZIER 12FR DISP (TUBING) ×1 IMPLANT
SUT BONE WAX W31G (SUTURE) IMPLANT
SUT MNCRL AB 3-0 PS2 18 (SUTURE) ×2 IMPLANT
SUT VIC AB 1 CT1 27 (SUTURE) ×4
SUT VIC AB 1 CT1 27XBRD ANTBC (SUTURE) ×4 IMPLANT
SUT VIC AB 2-0 CT1 27 (SUTURE) ×2
SUT VIC AB 2-0 CT1 TAPERPNT 27 (SUTURE) ×2 IMPLANT
SUT VLOC 180 0 24IN GS25 (SUTURE) ×2 IMPLANT
SYR 50ML LL SCALE MARK (SYRINGE) ×2 IMPLANT
TAPE STRIPS DRAPE STRL (GAUZE/BANDAGES/DRESSINGS) ×2 IMPLANT
TOWER CARTRIDGE SMART MIX (DISPOSABLE) ×2 IMPLANT
TRAY FOLEY W/METER SILVER 14FR (SET/KITS/TRAYS/PACK) IMPLANT
TRAY FOLEY W/METER SILVER 16FR (SET/KITS/TRAYS/PACK) ×2 IMPLANT
WATER STERILE IRR 1500ML POUR (IV SOLUTION) ×4 IMPLANT
WRAP KNEE MAXI GEL POST OP (GAUZE/BANDAGES/DRESSINGS) IMPLANT
YANKAUER SUCT BULB TIP 10FT TU (MISCELLANEOUS) ×2 IMPLANT

## 2016-03-19 NOTE — Anesthesia Procedure Notes (Signed)
Spinal  Patient location during procedure: OR Start time: 03/19/2016 1:42 PM End time: 03/19/2016 1:05 PM Staffing Anesthesiologist: Reginal Lutes Performed: anesthesiologist  Preanesthetic Checklist Completed: patient identified, site marked, surgical consent, pre-op evaluation, timeout performed, IV checked, risks and benefits discussed and monitors and equipment checked Spinal Block Patient position: sitting Prep: ChloraPrep Patient monitoring: heart rate, cardiac monitor, continuous pulse ox and blood pressure Approach: midline Location: L4-5 Injection technique: single-shot Needle Needle type: Sprotte  Needle gauge: 24 G Assessment Sensory level: T6

## 2016-03-19 NOTE — Op Note (Signed)
DATE OF SURGERY:  03/19/2016  TIME: 3:34 PM  PATIENT NAME:  Adam Jordan    AGE: 70 y.o.   PRE-OPERATIVE DIAGNOSIS:  left knee osteoarthritis  POST-OPERATIVE DIAGNOSIS:  left knee osteoarthritis  PROCEDURE:  Procedure(s): LEFT TOTAL KNEE ARTHROPLASTY  SURGEON:  Behr Cislo ANDREW  ASSISTANT:  Bryson Stilwell, PA-C, present and scrubbed throughout the case, critical for assistance with exposure, retraction, instrumentation, and closure.  OPERATIVE IMPLANTS: Depuy PFC Sigma Rotating Platform.  Femur size 4, Tibia size 4, Patella size 38 3-peg oval button, with a 12.5 mm polyethylene insert.   PREOPERATIVE INDICATIONS:   Adam Jordan is a 70 y.o. year old male with end stage bone on bone arthritis of the knee who failed conservative treatment and elected for Total Knee Arthroplasty.   The risks, benefits, and alternatives were discussed at length including but not limited to the risks of infection, bleeding, nerve injury, stiffness, blood clots, the need for revision surgery, cardiopulmonary complications, among others, and they were willing to proceed.  OPERATIVE DESCRIPTION:  The patient was brought to the operative room and placed in a supine position.  Spinal anesthesia was administered.  IV antibiotics were given.  The lower extremity was prepped and draped in the usual sterile fashion.  Time out was performed.  The leg was elevated and exsanguinated and the tourniquet was inflated.  Anterior quadriceps tendon splitting approach was performed.  The patella was retracted and osteophytes were removed.  The anterior horn of the medial and lateral meniscus was removed and cruciate ligaments resected.   The distal femur was opened with the drill and the intramedullary distal femoral cutting jig was utilized, set at 5 degrees resecting 10 mm off the distal femur.  Care was taken to protect the collateral ligaments.  The distal femoral sizing jig was applied, taking care to  avoid notching.  Then the 4-in-1 cutting jig was applied and the anterior and posterior femur was cut, along with the chamfer cuts.    Then the extramedullary tibial cutting jig was utilized making the appropriate cut using the anterior tibial crest as a reference building in appropriate posterior slope.  Care was taken during the cut to protect the medial and collateral ligaments.  The proximal tibia was removed along with the posterior horns of the menisci.   The posterior medial femoral osteophytes and posterior lateral femoral osteophytes were removed.    The flexion gap was then measured and was symmetric with the extension gap, measured at 12.  I completed the distal femoral preparation using the appropriate jig to prepare the box.  The patella was then measured, and cut with the saw.    The proximal tibia sized and prepared accordingly with the reamer and the punch, and then all components were trialed with the trial insert.  The knee was found to have excellent balance and full motion.    The above named components were then cemented into place and all excess cement was removed.  The trial polyethylene component was in place during cementation, and then was exchanged for the real polyethylene component.    The knee was easily taken through a range of motion and the patella tracked well and the knee irrigated copiously and the parapatellar and subcutaneous tissue closed with vicryl, and monocryl with steri strips for the skin.  The arthrotomy was closed at 90 of flexion. The wounds were dressed with sterile gauze and the tourniquet released and the patient was awakened and returned to the PACU  in stable and satisfactory condition.  There were no complications.  Total tourniquet time was 90 minutes.

## 2016-03-19 NOTE — Progress Notes (Signed)
AssistedDr. Lamarr Lulas with left, ultrasound guided, adductor canal block. Side rails up, monitors on throughout procedure. See vital signs in flow sheet. Tolerated Procedure well.

## 2016-03-19 NOTE — Transfer of Care (Signed)
Immediate Anesthesia Transfer of Care Note  Patient: Adam Jordan  Procedure(s) Performed: Procedure(s): LEFT TOTAL KNEE ARTHROPLASTY (Left)  Patient Location: PACU  Anesthesia Type:Spinal  Level of Consciousness: awake, alert  and oriented  Airway & Oxygen Therapy: Patient Spontanous Breathing and Patient connected to face mask oxygen  Post-op Assessment: Report given to RN and Post -op Vital signs reviewed and stable  Post vital signs: Reviewed and stable  Last Vitals:  Vitals:   03/19/16 1315 03/19/16 1330  BP: 111/65 120/60  Pulse: 66 71  Resp: 15 18  Temp:      Last Pain:  Vitals:   03/19/16 1100  TempSrc: Oral      Patients Stated Pain Goal: 4 (83/35/82 5189)  Complications: No apparent anesthesia complications

## 2016-03-19 NOTE — Anesthesia Postprocedure Evaluation (Signed)
Anesthesia Post Note  Patient: Adam Jordan  Procedure(s) Performed: Procedure(s) (LRB): LEFT TOTAL KNEE ARTHROPLASTY (Left)  Patient location during evaluation: PACU Anesthesia Type: Spinal Level of consciousness: oriented and awake and alert Pain management: pain level controlled Vital Signs Assessment: post-procedure vital signs reviewed and stable Respiratory status: spontaneous breathing, respiratory function stable and patient connected to nasal cannula oxygen Cardiovascular status: blood pressure returned to baseline and stable Postop Assessment: no headache and no backache Anesthetic complications: no    Last Vitals:  Vitals:   03/19/16 1830 03/19/16 1934  BP: 133/85 121/81  Pulse: 73 68  Resp: 16 16  Temp: 36.8 C 36.6 C    Last Pain:  Vitals:   03/19/16 1934  TempSrc: Oral  PainSc:                  Reginal Lutes

## 2016-03-19 NOTE — Anesthesia Procedure Notes (Signed)
Anesthesia Regional Block:  Adductor canal block  Pre-Anesthetic Checklist: ,, timeout performed, Correct Patient, Correct Site, Correct Laterality, Correct Procedure, Correct Position, site marked, Risks and benefits discussed,  Surgical consent,  Pre-op evaluation,  At surgeon's request and post-op pain management  Laterality: Left  Prep: chloraprep       Needles:   Needle Type: Echogenic Needle     Needle Length: 5cm 5 cm Needle Gauge: 22 and 22 G    Additional Needles:  Procedures: ultrasound guided (picture in chart) Adductor canal block Narrative:  Start time: 03/19/2016 12:55 PM End time: 03/19/2016 1:00 PM Injection made incrementally with aspirations every 30 mL.  Performed by: Personally  Anesthesiologist: Reginal Lutes  Additional Notes: Patient tolerated procedure well.

## 2016-03-19 NOTE — Interval H&P Note (Signed)
History and Physical Interval Note:  03/19/2016 1:32 PM  Adam Jordan  has presented today for surgery, with the diagnosis of left knee osteoarthritis  The various methods of treatment have been discussed with the patient and family. After consideration of risks, benefits and other options for treatment, the patient has consented to  Procedure(s): LEFT TOTAL KNEE ARTHROPLASTY (Left) as a surgical intervention .  The patient's history has been reviewed, patient examined, no change in status, stable for surgery.  I have reviewed the patient's chart and labs.  Questions were answered to the patient's satisfaction.     Virginie Josten ANDREW

## 2016-03-20 LAB — BASIC METABOLIC PANEL
ANION GAP: 7 (ref 5–15)
BUN: 15 mg/dL (ref 6–20)
CALCIUM: 9 mg/dL (ref 8.9–10.3)
CO2: 27 mmol/L (ref 22–32)
Chloride: 103 mmol/L (ref 101–111)
Creatinine, Ser: 0.72 mg/dL (ref 0.61–1.24)
GLUCOSE: 143 mg/dL — AB (ref 65–99)
POTASSIUM: 3.9 mmol/L (ref 3.5–5.1)
Sodium: 137 mmol/L (ref 135–145)

## 2016-03-20 LAB — CBC
HEMATOCRIT: 39.1 % (ref 39.0–52.0)
Hemoglobin: 13.3 g/dL (ref 13.0–17.0)
MCH: 30.7 pg (ref 26.0–34.0)
MCHC: 34 g/dL (ref 30.0–36.0)
MCV: 90.3 fL (ref 78.0–100.0)
Platelets: 196 10*3/uL (ref 150–400)
RBC: 4.33 MIL/uL (ref 4.22–5.81)
RDW: 13 % (ref 11.5–15.5)
WBC: 15.2 10*3/uL — AB (ref 4.0–10.5)

## 2016-03-20 NOTE — Progress Notes (Signed)
Physical Therapy Treatment Patient Details Name: JONAH GINGRAS MRN: [id number] DOB: 07/26/1946 Today's Date: 03/20/2016    History of Present Illness L TKA    PT Comments    Patient reports the Left knee is more sore so limited ambulation distance this visit. Patient plans DC tomorrow after PT in AM.  Follow Up Recommendations  Home health PT;Supervision - Intermittent     Equipment Recommendations  Rolling walker with 5" wheels    Recommendations for Other Services       Precautions / Restrictions Precautions Precautions: Knee;Fall    Mobility  Bed Mobility Overal bed mobility: Modified Independent                Transfers   Equipment used: Rolling walker (2 wheeled) Transfers: Sit to/from Stand Sit to Stand: Supervision         General transfer comment: cues for safety, pt tends to move quickly and stands before walker close enough.  Ambulation/Gait Ambulation/Gait assistance: Supervision Ambulation Distance (Feet): 400 Feet Assistive device: Rolling walker (2 wheeled) Gait Pattern/deviations: Step-through pattern     General Gait Details: slower paced this visit, reports the Left knee is more sore, encouraged patient to limit the distance.   Stairs            Wheelchair Mobility    Modified Rankin (Stroke Patients Only)       Balance                                    Cognition Arousal/Alertness: Awake/alert   Overall Cognitive Status: Within Functional Limits for tasks assessed                      Exercises      General Comments        Pertinent Vitals/Pain Pain Score: 7  Pain Location: L knee Pain Descriptors / Indicators: Aching;Discomfort;Tightness Pain Intervention(s): Premedicated before session;Repositioned;Ice applied    Home Living                      Prior Function            PT Goals (current goals can now be found in the care plan section) Progress towards PT goals:  Progressing toward goals    Frequency  7X/week    PT Plan Current plan remains appropriate    Co-evaluation             End of Session   Activity Tolerance: Patient tolerated treatment well Patient left: in bed;with call bell/phone within reach;with bed alarm set     Time: 0092-3300 PT Time Calculation (min) (ACUTE ONLY): 20 min  Charges:  $Gait Training: 8-22 mins                    G Codes:      Claretha Cooper 03/20/2016, 5:14 PM Tresa Endo PT [phone]
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
## 2016-03-20 NOTE — Progress Notes (Signed)
   Subjective:  Patient reports pain as mild to moderate.  Denies N/V/CP/SOB.  Objective:   VITALS:   Vitals:   03/19/16 1934 03/19/16 2039 03/20/16 0031 03/20/16 0436  BP: 121/81 133/68 136/80 123/72  Pulse: 68 80 70 65  Resp: '16 14 16 16  '$ Temp: 97.9 F (36.6 C) 98.8 F (37.1 C) 97.7 F (36.5 C) 97.6 F (36.4 C)  TempSrc: Oral Oral Oral Oral  SpO2: 98% 100% 94% 99%  Weight:      Height:        ABD soft Sensation intact distally Intact pulses distally Dorsiflexion/Plantar flexion intact Incision: dressing C/D/I Compartment soft HV scant ss  Lab Results  Component Value Date   WBC 15.2 (H) 03/20/2016   HGB 13.3 03/20/2016   HCT 39.1 03/20/2016   MCV 90.3 03/20/2016   PLT 196 03/20/2016   BMET    Component Value Date/Time   NA 137 03/20/2016 0444   K 3.9 03/20/2016 0444   CL 103 03/20/2016 0444   CO2 27 03/20/2016 0444   GLUCOSE 143 (H) 03/20/2016 0444   BUN 15 03/20/2016 0444   CREATININE 0.72 03/20/2016 0444   CALCIUM 9.0 03/20/2016 0444   GFRNONAA >60 03/20/2016 0444   GFRAA >60 03/20/2016 0444     Assessment/Plan: 1 Day Post-Op   Active Problems:   S/P knee replacement   WBAT LLE with walker DVT ppx: lovenox in house --> home on ASA, SCDs, TEDs PO pain control PT/OT D/C HV drain Dispo: d/c home tomorrow    Elie Goody 03/20/2016, 7:40 AM   Rod Can, MD Cell [phone]
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
## 2016-03-20 NOTE — Progress Notes (Signed)
CSW received consult for SNF placement, note PT evaluation recommended Home Health, ambulated 800' with rolling walker, supervision - therefore, would not be approved by [facility] for SNF placement.   No further CSW needs identified - CSW signing off.   Raynaldo Opitz, [facility] Clinical Social Worker cell #: [phone]
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
## 2016-03-21 LAB — CBC
HEMATOCRIT: 38.9 % — AB (ref 39.0–52.0)
HEMOGLOBIN: 12.9 g/dL — AB (ref 13.0–17.0)
MCH: 30.5 pg (ref 26.0–34.0)
MCHC: 33.2 g/dL (ref 30.0–36.0)
MCV: 92 fL (ref 78.0–100.0)
PLATELETS: 194 10*3/uL (ref 150–400)
RBC: 4.23 MIL/uL (ref 4.22–5.81)
RDW: 13.6 % (ref 11.5–15.5)
WBC: 11.1 10*3/uL — AB (ref 4.0–10.5)

## 2016-03-21 NOTE — Care Management (Signed)
CM spoke with patient. Patient has received the 3n1. Presenter, broadcasting BSN CCM

## 2016-03-21 NOTE — Progress Notes (Signed)
Physical Therapy Treatment Patient Details Name: Adam Jordan MRN: [id number] DOB: [birth year] Today's Date: 03/21/2016    History of Present Illness L TKA    PT Comments    Pt continues motivated but with increased discomfort this am.  Reviewed therex, stairs and lower body dressing techniques.  Follow Up Recommendations  Home health PT;Supervision - Intermittent     Equipment Recommendations  Rolling walker with 5" wheels    Recommendations for Other Services       Precautions / Restrictions Precautions Precautions: Knee;Fall Knee Immobilizer - Left: Discontinue once straight leg raise with < 10 degree lag Restrictions Weight Bearing Restrictions: No    Mobility  Bed Mobility Overal bed mobility: Modified Independent             General bed mobility comments: Pt to EOB self assisting L LE with UEs  Transfers Overall transfer level: Needs assistance Equipment used: Rolling walker (2 wheeled) Transfers: Sit to/from Stand Sit to Stand: Supervision         General transfer comment: cues for UE/LE placement.  Tends to move quickly  Ambulation/Gait Ambulation/Gait assistance: Min guard;Supervision Ambulation Distance (Feet): 180 Feet Assistive device: Rolling walker (2 wheeled) Gait Pattern/deviations: Step-to pattern;Step-through pattern;Decreased step length - right;Decreased step length - left;Shuffle;Trunk flexed     General Gait Details: cues for posture, position from RW and basic saftey awareness   Stairs Stairs: Yes Stairs assistance: Min assist Stair Management: No rails;Step to pattern;Backwards;With walker Number of Stairs: 2 General stair comments: single step twice with RW and cues for sequence and foot/RW placement  Wheelchair Mobility    Modified Rankin (Stroke Patients Only)       Balance                                    Cognition Arousal/Alertness: Awake/alert Behavior During Therapy: Impulsive Overall  Cognitive Status: Within Functional Limits for tasks assessed       Memory: Decreased recall of precautions;Decreased short-term memory              Exercises Total Joint Exercises Ankle Circles/Pumps: AROM;Both;10 reps Quad Sets: AROM;Both;15 reps;Supine Short Arc Quad: AROM;Left;10 reps Heel Slides: AAROM;Left;15 reps;Supine Straight Leg Raises: AAROM;AROM;Left;15 reps;Supine Goniometric ROM: AAROM L Knee -10- 40    General Comments        Pertinent Vitals/Pain Pain Assessment: 0-10 Pain Score: 5  Pain Location: L knee Pain Descriptors / Indicators: Aching;Sore Pain Intervention(s): Limited activity within patient's tolerance;Monitored during session;Premedicated before session;Ice applied    Home Living                      Prior Function            PT Goals (current goals can now be found in the care plan section) Acute Rehab PT Goals Patient Stated Goal: return to work PT Goal Formulation: With patient Time For Goal Achievement: 03/22/16 Potential to Achieve Goals: Good Progress towards PT goals: Progressing toward goals    Frequency  7X/week    PT Plan Current plan remains appropriate    Co-evaluation             End of Session Equipment Utilized During Treatment: Gait belt Activity Tolerance: Patient tolerated treatment well Patient left: in chair;with call bell/phone within reach;with chair alarm set     Time: 0626-9485 PT Time Calculation (min) (ACUTE ONLY): 48 min  Charges:  $  Gait Training: 8-22 mins $Therapeutic Exercise: 8-22 mins $Therapeutic Activity: 8-22 mins                    G Codes:      Clayborne Divis 23-Mar-2016, 12:42 PM

## 2016-03-21 NOTE — Progress Notes (Signed)
Occupational Therapy Treatment Patient Details Name: Adam Jordan MRN: [id number] DOB: 03-Nov-1945 Today's Date: 03/21/2016    History of present illness L TKA   OT comments  Pt continues to need cues for safety and he moves quickly but no LOB during session  Follow Up Recommendations  No OT follow up;Supervision/Assistance - 24 hour    Equipment Recommendations  3 in 1 bedside comode    Recommendations for Other Services      Precautions / Restrictions Precautions Precautions: Knee;Fall Knee Immobilizer - Left:  (KI not used) Restrictions Weight Bearing Restrictions: No       Mobility Bed Mobility               General bed mobility comments: oob  Transfers   Equipment used: Rolling walker (2 wheeled) Transfers: Sit to/from Stand Sit to Stand: Supervision         General transfer comment: cues for UE/LE placement.  Tends to move quickly    Balance                                   ADL       Grooming: Supervision/safety;Standing                   Toilet Transfer: Min guard;BSC;Ambulation             General ADL Comments: stood in bathroom to use urinal with supervision.  Reviewed precautions. Safety cues given as well as sequencing/walker distance when walking to bathroom. Pt distracts himself at time.  Pt plans to sponge bathe.  He will have HHPT help him decide when he is safely able to step into tub      Vision                     Perception     Praxis      Cognition   Behavior During Therapy: Impulsive Overall Cognitive Status:  (decreased safety)       Memory: Decreased recall of precautions;Decreased short-term memory               Extremity/Trunk Assessment               Exercises     Shoulder Instructions       General Comments      Pertinent Vitals/ Pain       Pain Score: 4  Pain Location: L knee Pain Descriptors / Indicators: Sore Pain Intervention(s): Limited  activity within patient's tolerance;Monitored during session;Repositioned;Ice applied;Premedicated before session  Home Living                                          Prior Functioning/Environment              Frequency       Progress Toward Goals  OT Goals(current goals can now be found in the care plan section)  Progress towards OT goals: Progressing toward goals     Plan      Co-evaluation                 End of Session     Activity Tolerance Patient tolerated treatment well   Patient Left in chair;with call bell/phone within reach;with chair alarm set   Nurse Communication  Time: 2836-6294 OT Time Calculation (min): 14 min  Charges: OT General Charges $OT Visit: 1 Procedure OT Treatments $Self Care/Home Management : 8-22 mins  Divya Munshi 03/21/2016, 11:00 AM Lesle Chris, OTR/L [phone] 03/21/2016

## 2016-03-21 NOTE — Discharge Summary (Signed)
Physician Discharge Summary  Patient ID: Adam Jordan MRN: [id number] DOB/AGE: 1945/12/22 70 y.o.  Admit date: 03/19/2016 Discharge date: 03/21/2016  Admission Diagnoses: L knee OA; COPD; malnutrion; chronic alcoholism; alcohol withdrawal delerium; dehydration  Discharge Diagnoses:  Active Problems:   S/P knee replacement same as above  Discharged Condition: stable  Hospital Course: Patient presented to [facility] for elective L TKR on 03/19/16 by Dr. Hillery Aldo.  The patient tolerated the procedure well without complication.  The patient was then admitted to the hospital.  The patient tolerated his stay well without complication or complaint.  He will be D/C/d home on 03/21/16.  Consults: None  Significant Diagnostic Studies: radiology: X-Ray: during operative procedure to ensure satisfactory anatomic alignment.  Treatments: IV hydration, antibiotics: Ancef, analgesia: acetaminophen, acetaminophen w/ codeine and Dilaudid, anticoagulation: lovenox and surgery: as stated above.  Discharge Exam: Blood pressure 127/66, pulse 77, temperature 98.4 F (36.9 C), temperature source Oral, resp. rate 20, height '5\' 8"'$  (1.727 m), weight 81.6 kg (180 lb), SpO2 94 %. General: WDWN patient in NAD. Psych:  Appropriate mood and affect. Neuro:  A&O x 3, Moving all extremities, sensation intact to light touch HEENT:  EOMs intact Chest:  Even non-labored respirations Skin:  Incision/dressing C/D/I, no rashes or lesions Extremities: warm/dry, mild edema, no erythema or echymosis.  No lymphadenopathy. Pulses: Popliteus 2+ MSK:  ROM: Full ankle ROM, Knee ext to 5 degrees, MMT: patient is able to perform a quad set, (-) Homan's   Disposition: 01-Home or Self Care  Discharge Instructions    Call MD / Call 911    Complete by:  As directed   If you experience chest pain or shortness of breath, CALL 911 and be transported to the hospital emergency room.  If you develope a fever above 101 F, pus  (white drainage) or increased drainage or redness at the wound, or calf pain, call your surgeon's office.   Call MD / Call 911    Complete by:  As directed   If you experience chest pain or shortness of breath, CALL 911 and be transported to the hospital emergency room.  If you develope a fever above 101 F, pus (white drainage) or increased drainage or redness at the wound, or calf pain, call your surgeon's office.   Constipation Prevention    Complete by:  As directed   Drink plenty of fluids.  Prune juice may be helpful.  You may use a stool softener, such as Colace (over the counter) 100 mg twice a day.  Use MiraLax (over the counter) for constipation as needed.   Constipation Prevention    Complete by:  As directed   Drink plenty of fluids.  Prune juice may be helpful.  You may use a stool softener, such as Colace (over the counter) 100 mg twice a day.  Use MiraLax (over the counter) for constipation as needed.   Diet - low sodium heart healthy    Complete by:  As directed   Diet - low sodium heart healthy    Complete by:  As directed   Discharge instructions    Complete by:  As directed   INSTRUCTIONS AFTER JOINT REPLACEMENT   Remove items at home which could result in a fall. This includes throw rugs or furniture in walking pathways ICE to the affected joint every three hours while awake for 30 minutes at a time, for at least the first 3-5 days, and then as needed for pain and swelling.  Continue to use ice for pain and swelling. You may notice swelling that will progress down to the foot and ankle.  This is normal after surgery.  Elevate your leg when you are not up walking on it.   Continue to use the breathing machine you got in the hospital (incentive spirometer) which will help keep your temperature down.  It is common for your temperature to cycle up and down following surgery, especially at night when you are not up moving around and exerting yourself.  The breathing machine keeps your  lungs expanded and your temperature down.   DIET:  As you were doing prior to hospitalization, we recommend a well-balanced diet.  DRESSING / WOUND CARE / SHOWERING  Keep the surgical dressing until follow up.  The dressing is water proof, so you can shower without any extra covering.  IF THE DRESSING FALLS OFF or the wound gets wet inside, change the dressing with sterile gauze.  Please use good hand washing techniques before changing the dressing.  Do not use any lotions or creams on the incision until instructed by your surgeon.    ACTIVITY  Increase activity slowly as tolerated, but follow the weight bearing instructions below.   No driving for 6 weeks or until further direction given by your physician.  You cannot drive while taking narcotics.  No lifting or carrying greater than 10 lbs. until further directed by your surgeon. Avoid periods of inactivity such as sitting longer than an hour when not asleep. This helps prevent blood clots.  You may return to work once you are authorized by your doctor.     WEIGHT BEARING   Weight bearing as tolerated with assist device (walker, cane, etc) as directed, use it as long as suggested by your surgeon or therapist, typically at least 4-6 weeks.   EXERCISES  Results after joint replacement surgery are often greatly improved when you follow the exercise, range of motion and muscle strengthening exercises prescribed by your doctor. Safety measures are also important to protect the joint from further injury. Any time any of these exercises cause you to have increased pain or swelling, decrease what you are doing until you are comfortable again and then slowly increase them. If you have problems or questions, call your caregiver or physical therapist for advice.   Rehabilitation is important following a joint replacement. After just a few days of immobilization, the muscles of the leg can become weakened and shrink (atrophy).  These exercises are  designed to build up the tone and strength of the thigh and leg muscles and to improve motion. Often times heat used for twenty to thirty minutes before working out will loosen up your tissues and help with improving the range of motion but do not use heat for the first two weeks following surgery (sometimes heat can increase post-operative swelling).   These exercises can be done on a training (exercise) mat, on the floor, on a table or on a bed. Use whatever works the best and is most comfortable for you.    Use music or television while you are exercising so that the exercises are a pleasant break in your day. This will make your life better with the exercises acting as a break in your routine that you can look forward to.   Perform all exercises about fifteen times, three times per day or as directed.  You should exercise both the operative leg and the other leg as well.   Exercises include:  Quad Sets - Tighten up the muscle on the front of the thigh (Quad) and hold for 5-10 seconds.   Straight Leg Raises - With your knee straight (if you were given a brace, keep it on), lift the leg to 60 degrees, hold for 3 seconds, and slowly lower the leg.  Perform this exercise against resistance later as your leg gets stronger.  Leg Slides: Lying on your back, slowly slide your foot toward your buttocks, bending your knee up off the floor (only go as far as is comfortable). Then slowly slide your foot back down until your leg is flat on the floor again.  Angel Wings: Lying on your back spread your legs to the side as far apart as you can without causing discomfort.  Hamstring Strength:  Lying on your back, push your heel against the floor with your leg straight by tightening up the muscles of your buttocks.  Repeat, but this time bend your knee to a comfortable angle, and push your heel against the floor.  You may put a pillow under the heel to make it more comfortable if necessary.   A rehabilitation program  following joint replacement surgery can speed recovery and prevent re-injury in the future due to weakened muscles. Contact your doctor or a physical therapist for more information on knee rehabilitation.    CONSTIPATION  Constipation is defined medically as fewer than three stools per week and severe constipation as less than one stool per week.  Even if you have a regular bowel pattern at home, your normal regimen is likely to be disrupted due to multiple reasons following surgery.  Combination of anesthesia, postoperative narcotics, change in appetite and fluid intake all can affect your bowels.   YOU MUST use at least one of the following options; they are listed in order of increasing strength to get the job done.  They are all available over the counter, and you may need to use some, POSSIBLY even all of these options:    Drink plenty of fluids (prune juice may be helpful) and high fiber foods Colace 100 mg by mouth twice a day  Senokot for constipation as directed and as needed Dulcolax (bisacodyl), take with full glass of water  Miralax (polyethylene glycol) once or twice a day as needed.  If you have tried all these things and are unable to have a bowel movement in the first 3-4 days after surgery call either your surgeon or your primary doctor.    If you experience loose stools or diarrhea, hold the medications until you stool forms back up.  If your symptoms do not get better within 1 week or if they get worse, check with your doctor.  If you experience "the worst abdominal pain ever" or develop nausea or vomiting, please contact the office immediately for further recommendations for treatment.   ITCHING:  If you experience itching with your medications, try taking only a single pain pill, or even half a pain pill at a time.  You can also use Benadryl over the counter for itching or also to help with sleep.   TED HOSE STOCKINGS:  Use stockings on both legs until for at least 2 weeks  or as directed by physician office. They may be removed at night for sleeping.  MEDICATIONS:  See your medication summary on the "After Visit Summary" that nursing will review with you.  You may have some home medications which will be placed on hold until you complete the course  of blood thinner medication.  It is important for you to complete the blood thinner medication as prescribed.  PRECAUTIONS:  If you experience chest pain or shortness of breath - call 911 immediately for transfer to the hospital emergency department.   If you develop a fever greater that 101 F, purulent drainage from wound, increased redness or drainage from wound, foul odor from the wound/dressing, or calf pain - CONTACT YOUR SURGEON.                                                   FOLLOW-UP APPOINTMENTS:  If you do not already have a post-op appointment, please call the office for an appointment to be seen by your surgeon.  Guidelines for how soon to be seen are listed in your "After Visit Summary", but are typically between 1-4 weeks after surgery.  OTHER INSTRUCTIONS:   Knee Replacement:  Do not place pillow under knee, focus on keeping the knee straight while resting. CPM instructions: 0-90 degrees, 2 hours in the morning, 2 hours in the afternoon, and 2 hours in the evening. Place foam block, curve side up under heel at all times except when in CPM or when walking.  DO NOT modify, tear, cut, or change the foam block in any way.  MAKE SURE YOU:  Understand these instructions.  Get help right away if you are not doing well or get worse.    Thank you for letting us be a part of your medical care team.  It is a privilege we respect greatly.  We hope these instructions will help you stay on track for a fast and full recovery!   Increase activity slowly as tolerated    Complete by:  As directed   Increase activity slowly as tolerated    Complete by:  As directed   Weight bearing as tolerated    Complete by:  As  directed   With rolling walker.   Laterality:  left   Extremity:  Lower       Medication List    TAKE these medications   aspirin EC 325 MG tablet Take 1 tablet (325 mg total) by mouth 2 (two) times daily.   clonazePAM 1 MG tablet Commonly known as:  KLONOPIN Take 1 tablet (1 mg total) by mouth 2 (two) times daily as needed for anxiety.   meloxicam 15 MG tablet Commonly known as:  MOBIC Take 15 mg by mouth at bedtime.   methocarbamol 500 MG tablet Commonly known as:  ROBAXIN Take 1 tablet (500 mg total) by mouth every 8 (eight) hours as needed for muscle spasms.   montelukast 10 MG tablet Commonly known as:  SINGULAIR Take 10 mg by mouth daily.   multivitamin with minerals Tabs tablet Take 1 tablet by mouth daily.   OVER THE COUNTER MEDICATION Take 1 scoop by mouth at bedtime. Oxy-powder (colon cleanser).   oxyCODONE-acetaminophen 5-325 MG tablet Commonly known as:  ROXICET Take 1-2 tablets by mouth every 4 (four) hours as needed for severe pain.   oxymetazoline 0.05 % nasal spray Commonly known as:  AFRIN Place 1 spray into both nostrils 2 (two) times daily as needed for congestion.   albuterol (2.5 MG/3ML) 0.083% nebulizer solution Commonly known as:  PROVENTIL Take 2.5 mg by nebulization every 6 (six) hours as needed for wheezing or shortness  of breath.   PROAIR HFA 108 (90 Base) MCG/ACT inhaler Generic drug:  albuterol Inhale 2 puffs into the lungs every 6 (six) hours as needed for wheezing or shortness of breath.   sildenafil 20 MG tablet Commonly known as:  REVATIO Take 40 mg by mouth daily as needed for erectile dysfunction.   traZODone 50 MG tablet Commonly known as:  DESYREL Take 1 tablet (50 mg total) by mouth at bedtime as needed for sleep.      Follow-up Information    Cynda Familia, MD. Schedule an appointment as soon as possible for a visit in 2 week(s).   Specialty:  Orthopedic Surgery Contact information: [address] [place] [address] [phone]           Signed: Mechele Claude, Hershal Coria, [facility] Office:  [phone]

## 2016-03-21 NOTE — Progress Notes (Signed)
Subjective: 2 Days Post-Op Procedure(s) (LRB): LEFT TOTAL KNEE ARTHROPLASTY (Left)  Patient reports pain as mild to moderate.  Tolerating POs well.  Denies BM, however admits to flatulence.  Denies fever, chills, N/V.  Reports that he is ready to go home.  Objective:   VITALS:  Temp:  [97.9 F (36.6 C)-98.4 F (36.9 C)] 98.4 F (36.9 C) (08/20 0426) Pulse Rate:  [68-80] 77 (08/20 0426) Resp:  [16-20] 20 (08/20 0426) BP: (107-135)/(66-81) 127/66 (08/20 0426) SpO2:  [94 %-97 %] 94 % (08/20 0426)  General: WDWN patient in NAD. Psych:  Appropriate mood and affect. Neuro:  A&O x 3, Moving all extremities, sensation intact to light touch HEENT:  EOMs intact Chest:  Even non-labored respirations Skin:  Incision/Dressing C/D/I, no rashes or lesions Extremities: warm/dry, mild edema, no erythmea or echymosis.  No lymphadenopathy. Pulses: Popliteus 2+ MSK:  ROM: Full ankle ROM.  Knee extension to 5 degrees, MMT: patient is able to perform quad set, (-) Homan's    LABS  Recent Labs  03/20/16 0444 03/21/16 0458  HGB 13.3 12.9*  WBC 15.2* 11.1*  PLT 196 194    Recent Labs  03/20/16 0444  NA 137  K 3.9  CL 103  CO2 27  BUN 15  CREATININE 0.72  GLUCOSE 143*   No results for input(s): LABPT, INR in the last 72 hours.   Assessment/Plan: 2 Days Post-Op Procedure(s) (LRB): LEFT TOTAL KNEE ARTHROPLASTY (Left)  D/C home today.  Scripts on chart. WBAT LLE with rolling walker ASA for DVT prophylaxis Plan for outpatient post-op visit with Dr. Weyman Croon, PA-C, [facility] Office:  [phone]

## 2016-03-21 NOTE — Progress Notes (Signed)
RN reviewed discharge instructions with patient and family. All questions answered.   Paperwork and prescriptions given.   NT rolled patient down with all belongings to family car. 

## 2016-04-16 ENCOUNTER — Other Ambulatory Visit (HOSPITAL_COMMUNITY): Payer: Self-pay | Admitting: Pulmonary Disease

## 2016-04-16 DIAGNOSIS — R51 Headache: Principal | ICD-10-CM

## 2016-04-16 DIAGNOSIS — R519 Headache, unspecified: Secondary | ICD-10-CM

## 2016-04-16 DIAGNOSIS — R079 Chest pain, unspecified: Secondary | ICD-10-CM

## 2016-04-21 ENCOUNTER — Ambulatory Visit (HOSPITAL_COMMUNITY)
Admission: RE | Admit: 2016-04-21 | Discharge: 2016-04-21 | Disposition: A | Discharge status: Home / Self Care | Payer: Medicare Other | Source: Ambulatory Visit | Attending: Pulmonary Disease | Admitting: Pulmonary Disease

## 2016-04-21 DIAGNOSIS — R938 Abnormal findings on diagnostic imaging of other specified body structures: Secondary | ICD-10-CM | POA: Insufficient documentation

## 2016-04-21 DIAGNOSIS — R519 Headache, unspecified: Secondary | ICD-10-CM

## 2016-04-21 DIAGNOSIS — R9082 White matter disease, unspecified: Secondary | ICD-10-CM | POA: Insufficient documentation

## 2016-04-21 DIAGNOSIS — I700 Atherosclerosis of aorta: Secondary | ICD-10-CM | POA: Insufficient documentation

## 2016-04-21 DIAGNOSIS — R937 Abnormal findings on diagnostic imaging of other parts of musculoskeletal system: Secondary | ICD-10-CM | POA: Insufficient documentation

## 2016-04-21 DIAGNOSIS — I2510 Atherosclerotic heart disease of native coronary artery without angina pectoris: Secondary | ICD-10-CM | POA: Diagnosis not present

## 2016-04-21 DIAGNOSIS — R079 Chest pain, unspecified: Secondary | ICD-10-CM | POA: Diagnosis present

## 2016-04-21 DIAGNOSIS — J439 Emphysema, unspecified: Secondary | ICD-10-CM | POA: Diagnosis not present

## 2016-04-21 DIAGNOSIS — R51 Headache: Secondary | ICD-10-CM

## 2016-08-04 DIAGNOSIS — M25561 Pain in right knee: Secondary | ICD-10-CM | POA: Diagnosis not present

## 2016-08-04 DIAGNOSIS — Z471 Aftercare following joint replacement surgery: Secondary | ICD-10-CM | POA: Diagnosis not present

## 2016-08-04 DIAGNOSIS — Z96652 Presence of left artificial knee joint: Secondary | ICD-10-CM | POA: Diagnosis not present

## 2016-08-24 DIAGNOSIS — Z0000 Encounter for general adult medical examination without abnormal findings: Secondary | ICD-10-CM | POA: Diagnosis not present

## 2016-10-26 DIAGNOSIS — Z471 Aftercare following joint replacement surgery: Secondary | ICD-10-CM | POA: Diagnosis not present

## 2016-10-26 DIAGNOSIS — Z96611 Presence of right artificial shoulder joint: Secondary | ICD-10-CM | POA: Diagnosis not present

## 2016-10-26 DIAGNOSIS — Z96652 Presence of left artificial knee joint: Secondary | ICD-10-CM | POA: Diagnosis not present

## 2016-10-26 DIAGNOSIS — M6281 Muscle weakness (generalized): Secondary | ICD-10-CM | POA: Diagnosis not present

## 2016-11-02 DIAGNOSIS — M25511 Pain in right shoulder: Secondary | ICD-10-CM | POA: Diagnosis not present

## 2016-11-02 DIAGNOSIS — G8929 Other chronic pain: Secondary | ICD-10-CM | POA: Diagnosis not present

## 2016-11-02 DIAGNOSIS — Z96611 Presence of right artificial shoulder joint: Secondary | ICD-10-CM | POA: Diagnosis not present

## 2016-11-12 DIAGNOSIS — M159 Polyosteoarthritis, unspecified: Secondary | ICD-10-CM | POA: Diagnosis not present

## 2016-11-12 DIAGNOSIS — Z6827 Body mass index (BMI) 27.0-27.9, adult: Secondary | ICD-10-CM | POA: Diagnosis not present

## 2016-11-12 DIAGNOSIS — Z79899 Other long term (current) drug therapy: Secondary | ICD-10-CM | POA: Diagnosis not present

## 2016-11-12 DIAGNOSIS — H9110 Presbycusis, unspecified ear: Secondary | ICD-10-CM | POA: Diagnosis not present

## 2016-11-12 DIAGNOSIS — R69 Illness, unspecified: Secondary | ICD-10-CM | POA: Diagnosis not present

## 2016-11-12 DIAGNOSIS — K5900 Constipation, unspecified: Secondary | ICD-10-CM | POA: Diagnosis not present

## 2016-11-12 DIAGNOSIS — M47816 Spondylosis without myelopathy or radiculopathy, lumbar region: Secondary | ICD-10-CM | POA: Diagnosis not present

## 2016-11-12 DIAGNOSIS — J309 Allergic rhinitis, unspecified: Secondary | ICD-10-CM | POA: Diagnosis not present

## 2016-11-12 DIAGNOSIS — Z0000 Encounter for general adult medical examination without abnormal findings: Secondary | ICD-10-CM | POA: Diagnosis not present

## 2016-11-12 DIAGNOSIS — Z7982 Long term (current) use of aspirin: Secondary | ICD-10-CM | POA: Diagnosis not present

## 2016-11-12 DIAGNOSIS — M62830 Muscle spasm of back: Secondary | ICD-10-CM | POA: Diagnosis not present

## 2016-11-12 DIAGNOSIS — G4700 Insomnia, unspecified: Secondary | ICD-10-CM | POA: Diagnosis not present

## 2016-12-08 DIAGNOSIS — M7632 Iliotibial band syndrome, left leg: Secondary | ICD-10-CM | POA: Diagnosis not present

## 2016-12-08 DIAGNOSIS — Z471 Aftercare following joint replacement surgery: Secondary | ICD-10-CM | POA: Diagnosis not present

## 2016-12-08 DIAGNOSIS — Z96652 Presence of left artificial knee joint: Secondary | ICD-10-CM | POA: Diagnosis not present

## 2017-06-13 DIAGNOSIS — M7632 Iliotibial band syndrome, left leg: Secondary | ICD-10-CM | POA: Diagnosis not present

## 2017-06-13 DIAGNOSIS — Z471 Aftercare following joint replacement surgery: Secondary | ICD-10-CM | POA: Diagnosis not present

## 2017-06-13 DIAGNOSIS — Z96652 Presence of left artificial knee joint: Secondary | ICD-10-CM | POA: Diagnosis not present

## 2017-07-18 DIAGNOSIS — I10 Essential (primary) hypertension: Secondary | ICD-10-CM | POA: Diagnosis not present

## 2017-07-18 DIAGNOSIS — K210 Gastro-esophageal reflux disease with esophagitis: Secondary | ICD-10-CM | POA: Diagnosis not present

## 2017-07-18 DIAGNOSIS — R69 Illness, unspecified: Secondary | ICD-10-CM | POA: Diagnosis not present

## 2017-07-18 DIAGNOSIS — J441 Chronic obstructive pulmonary disease with (acute) exacerbation: Secondary | ICD-10-CM | POA: Diagnosis not present

## 2017-08-29 DIAGNOSIS — J449 Chronic obstructive pulmonary disease, unspecified: Secondary | ICD-10-CM | POA: Diagnosis not present

## 2017-08-29 DIAGNOSIS — M179 Osteoarthritis of knee, unspecified: Secondary | ICD-10-CM | POA: Diagnosis not present

## 2017-08-29 DIAGNOSIS — Z125 Encounter for screening for malignant neoplasm of prostate: Secondary | ICD-10-CM | POA: Diagnosis not present

## 2017-08-29 DIAGNOSIS — Z0000 Encounter for general adult medical examination without abnormal findings: Secondary | ICD-10-CM | POA: Diagnosis not present

## 2017-08-29 DIAGNOSIS — R69 Illness, unspecified: Secondary | ICD-10-CM | POA: Diagnosis not present

## 2017-08-29 DIAGNOSIS — E785 Hyperlipidemia, unspecified: Secondary | ICD-10-CM | POA: Diagnosis not present

## 2017-08-29 DIAGNOSIS — I10 Essential (primary) hypertension: Secondary | ICD-10-CM | POA: Diagnosis not present

## 2017-09-05 DIAGNOSIS — M1711 Unilateral primary osteoarthritis, right knee: Secondary | ICD-10-CM | POA: Diagnosis not present

## 2017-09-05 DIAGNOSIS — M25562 Pain in left knee: Secondary | ICD-10-CM | POA: Diagnosis not present

## 2018-02-17 DIAGNOSIS — J301 Allergic rhinitis due to pollen: Secondary | ICD-10-CM | POA: Diagnosis not present

## 2018-02-17 DIAGNOSIS — J449 Chronic obstructive pulmonary disease, unspecified: Secondary | ICD-10-CM | POA: Diagnosis not present

## 2018-02-17 DIAGNOSIS — I10 Essential (primary) hypertension: Secondary | ICD-10-CM | POA: Diagnosis not present

## 2018-02-17 DIAGNOSIS — R69 Illness, unspecified: Secondary | ICD-10-CM | POA: Diagnosis not present

## 2018-04-12 DIAGNOSIS — M1712 Unilateral primary osteoarthritis, left knee: Secondary | ICD-10-CM | POA: Diagnosis not present

## 2018-04-12 DIAGNOSIS — M25561 Pain in right knee: Secondary | ICD-10-CM | POA: Diagnosis not present

## 2018-05-10 DIAGNOSIS — M25361 Other instability, right knee: Secondary | ICD-10-CM | POA: Diagnosis not present

## 2018-09-07 DIAGNOSIS — Z0000 Encounter for general adult medical examination without abnormal findings: Secondary | ICD-10-CM | POA: Diagnosis not present

## 2018-09-08 DIAGNOSIS — R739 Hyperglycemia, unspecified: Secondary | ICD-10-CM | POA: Diagnosis not present

## 2018-09-08 DIAGNOSIS — E785 Hyperlipidemia, unspecified: Secondary | ICD-10-CM | POA: Diagnosis not present

## 2018-09-08 DIAGNOSIS — I10 Essential (primary) hypertension: Secondary | ICD-10-CM | POA: Diagnosis not present

## 2018-09-08 DIAGNOSIS — J449 Chronic obstructive pulmonary disease, unspecified: Secondary | ICD-10-CM | POA: Diagnosis not present

## 2018-09-08 DIAGNOSIS — Z125 Encounter for screening for malignant neoplasm of prostate: Secondary | ICD-10-CM | POA: Diagnosis not present

## 2018-09-08 DIAGNOSIS — J309 Allergic rhinitis, unspecified: Secondary | ICD-10-CM | POA: Diagnosis not present

## 2018-09-08 DIAGNOSIS — M179 Osteoarthritis of knee, unspecified: Secondary | ICD-10-CM | POA: Diagnosis not present

## 2018-09-08 DIAGNOSIS — Z0000 Encounter for general adult medical examination without abnormal findings: Secondary | ICD-10-CM | POA: Diagnosis not present

## 2018-09-08 DIAGNOSIS — R69 Illness, unspecified: Secondary | ICD-10-CM | POA: Diagnosis not present

## 2018-09-25 DIAGNOSIS — Z1212 Encounter for screening for malignant neoplasm of rectum: Secondary | ICD-10-CM | POA: Diagnosis not present

## 2018-09-25 DIAGNOSIS — Z1211 Encounter for screening for malignant neoplasm of colon: Secondary | ICD-10-CM | POA: Diagnosis not present

## 2018-11-20 DIAGNOSIS — M25511 Pain in right shoulder: Secondary | ICD-10-CM | POA: Diagnosis not present

## 2018-11-20 DIAGNOSIS — M25562 Pain in left knee: Secondary | ICD-10-CM | POA: Diagnosis not present

## 2018-11-20 DIAGNOSIS — M1711 Unilateral primary osteoarthritis, right knee: Secondary | ICD-10-CM | POA: Diagnosis not present

## 2019-04-13 ENCOUNTER — Other Ambulatory Visit: Payer: Self-pay | Admitting: Orthopedic Surgery

## 2019-04-13 DIAGNOSIS — M25511 Pain in right shoulder: Secondary | ICD-10-CM

## 2019-04-13 DIAGNOSIS — M5412 Radiculopathy, cervical region: Secondary | ICD-10-CM | POA: Diagnosis not present

## 2019-04-13 DIAGNOSIS — T84019A Broken internal joint prosthesis, unspecified site, initial encounter: Secondary | ICD-10-CM | POA: Diagnosis not present

## 2019-04-20 ENCOUNTER — Ambulatory Visit
Admission: RE | Admit: 2019-04-20 | Discharge: 2019-04-20 | Disposition: A | Discharge status: Home / Self Care | Payer: Medicare HMO | Source: Ambulatory Visit | Attending: Orthopedic Surgery | Admitting: Orthopedic Surgery

## 2019-04-20 DIAGNOSIS — M25511 Pain in right shoulder: Secondary | ICD-10-CM

## 2019-04-20 DIAGNOSIS — M25411 Effusion, right shoulder: Secondary | ICD-10-CM | POA: Diagnosis not present

## 2019-04-23 DIAGNOSIS — M5412 Radiculopathy, cervical region: Secondary | ICD-10-CM | POA: Diagnosis not present

## 2019-05-16 DIAGNOSIS — M5412 Radiculopathy, cervical region: Secondary | ICD-10-CM | POA: Diagnosis not present

## 2019-05-16 DIAGNOSIS — M25551 Pain in right hip: Secondary | ICD-10-CM | POA: Diagnosis not present

## 2019-05-16 DIAGNOSIS — M545 Low back pain: Secondary | ICD-10-CM | POA: Diagnosis not present

## 2019-05-31 DIAGNOSIS — G5621 Lesion of ulnar nerve, right upper limb: Secondary | ICD-10-CM | POA: Insufficient documentation

## 2019-05-31 DIAGNOSIS — M5412 Radiculopathy, cervical region: Secondary | ICD-10-CM | POA: Insufficient documentation

## 2019-05-31 HISTORY — DX: Radiculopathy, cervical region: M54.12

## 2019-05-31 HISTORY — DX: Lesion of ulnar nerve, right upper limb: G56.21

## 2019-06-14 DIAGNOSIS — M542 Cervicalgia: Secondary | ICD-10-CM | POA: Diagnosis not present

## 2019-06-14 DIAGNOSIS — M545 Low back pain: Secondary | ICD-10-CM | POA: Diagnosis not present

## 2019-06-14 DIAGNOSIS — M5416 Radiculopathy, lumbar region: Secondary | ICD-10-CM | POA: Diagnosis not present

## 2019-06-14 DIAGNOSIS — M5412 Radiculopathy, cervical region: Secondary | ICD-10-CM | POA: Diagnosis not present

## 2019-06-15 DIAGNOSIS — R69 Illness, unspecified: Secondary | ICD-10-CM | POA: Diagnosis not present

## 2019-06-25 DIAGNOSIS — M4802 Spinal stenosis, cervical region: Secondary | ICD-10-CM | POA: Diagnosis not present

## 2019-06-25 DIAGNOSIS — M5412 Radiculopathy, cervical region: Secondary | ICD-10-CM | POA: Diagnosis not present

## 2019-06-25 DIAGNOSIS — M4800 Spinal stenosis, site unspecified: Secondary | ICD-10-CM

## 2019-06-25 HISTORY — DX: Spinal stenosis, site unspecified: M48.00

## 2019-07-03 DIAGNOSIS — M5416 Radiculopathy, lumbar region: Secondary | ICD-10-CM | POA: Diagnosis not present

## 2019-07-17 DIAGNOSIS — U071 COVID-19: Secondary | ICD-10-CM | POA: Diagnosis not present

## 2019-07-20 DIAGNOSIS — M5416 Radiculopathy, lumbar region: Secondary | ICD-10-CM | POA: Diagnosis not present

## 2019-07-20 DIAGNOSIS — M5116 Intervertebral disc disorders with radiculopathy, lumbar region: Secondary | ICD-10-CM | POA: Diagnosis not present

## 2019-07-31 DIAGNOSIS — Z6827 Body mass index (BMI) 27.0-27.9, adult: Secondary | ICD-10-CM | POA: Insufficient documentation

## 2019-07-31 DIAGNOSIS — Z9889 Other specified postprocedural states: Secondary | ICD-10-CM

## 2019-07-31 DIAGNOSIS — R030 Elevated blood-pressure reading, without diagnosis of hypertension: Secondary | ICD-10-CM | POA: Insufficient documentation

## 2019-07-31 HISTORY — DX: Elevated blood-pressure reading, without diagnosis of hypertension: R03.0

## 2019-07-31 HISTORY — DX: Body mass index (BMI) 27.0-27.9, adult: Z68.27

## 2019-07-31 HISTORY — DX: Other specified postprocedural states: Z98.890

## 2019-09-18 DIAGNOSIS — R7309 Other abnormal glucose: Secondary | ICD-10-CM | POA: Diagnosis not present

## 2019-09-18 DIAGNOSIS — R946 Abnormal results of thyroid function studies: Secondary | ICD-10-CM | POA: Diagnosis not present

## 2019-09-18 DIAGNOSIS — M1990 Unspecified osteoarthritis, unspecified site: Secondary | ICD-10-CM | POA: Diagnosis not present

## 2019-09-18 DIAGNOSIS — Z79899 Other long term (current) drug therapy: Secondary | ICD-10-CM | POA: Diagnosis not present

## 2019-09-18 DIAGNOSIS — Z7189 Other specified counseling: Secondary | ICD-10-CM | POA: Diagnosis not present

## 2019-09-18 DIAGNOSIS — Z0000 Encounter for general adult medical examination without abnormal findings: Secondary | ICD-10-CM | POA: Diagnosis not present

## 2019-09-18 DIAGNOSIS — J449 Chronic obstructive pulmonary disease, unspecified: Secondary | ICD-10-CM | POA: Diagnosis not present

## 2019-09-18 DIAGNOSIS — Z13220 Encounter for screening for lipoid disorders: Secondary | ICD-10-CM | POA: Diagnosis not present

## 2019-09-18 DIAGNOSIS — K429 Umbilical hernia without obstruction or gangrene: Secondary | ICD-10-CM | POA: Diagnosis not present

## 2019-09-18 DIAGNOSIS — J302 Other seasonal allergic rhinitis: Secondary | ICD-10-CM | POA: Diagnosis not present

## 2019-09-21 DIAGNOSIS — E7800 Pure hypercholesterolemia, unspecified: Secondary | ICD-10-CM | POA: Diagnosis not present

## 2019-09-21 DIAGNOSIS — R7309 Other abnormal glucose: Secondary | ICD-10-CM | POA: Diagnosis not present

## 2019-09-21 DIAGNOSIS — Z125 Encounter for screening for malignant neoplasm of prostate: Secondary | ICD-10-CM | POA: Diagnosis not present

## 2019-09-21 DIAGNOSIS — R946 Abnormal results of thyroid function studies: Secondary | ICD-10-CM | POA: Diagnosis not present

## 2019-09-21 DIAGNOSIS — Z0000 Encounter for general adult medical examination without abnormal findings: Secondary | ICD-10-CM | POA: Diagnosis not present

## 2019-09-21 DIAGNOSIS — Z79899 Other long term (current) drug therapy: Secondary | ICD-10-CM | POA: Diagnosis not present

## 2019-09-21 DIAGNOSIS — Z13220 Encounter for screening for lipoid disorders: Secondary | ICD-10-CM | POA: Diagnosis not present

## 2019-09-24 ENCOUNTER — Other Ambulatory Visit: Payer: Self-pay | Admitting: Family Medicine

## 2019-09-24 DIAGNOSIS — K429 Umbilical hernia without obstruction or gangrene: Secondary | ICD-10-CM

## 2019-10-05 ENCOUNTER — Other Ambulatory Visit: Payer: Self-pay

## 2019-10-05 ENCOUNTER — Ambulatory Visit
Admission: RE | Admit: 2019-10-05 | Discharge: 2019-10-05 | Disposition: A | Discharge status: Home / Self Care | Payer: Medicare HMO | Source: Ambulatory Visit | Attending: Family Medicine | Admitting: Family Medicine

## 2019-10-05 DIAGNOSIS — N200 Calculus of kidney: Secondary | ICD-10-CM | POA: Diagnosis not present

## 2019-10-05 DIAGNOSIS — K429 Umbilical hernia without obstruction or gangrene: Secondary | ICD-10-CM

## 2019-10-05 MED ORDER — IOPAMIDOL (ISOVUE-300) INJECTION 61%
100.0000 mL | Freq: Once | INTRAVENOUS | Status: AC | PRN
  Administered 2019-10-05: 100 mL via INTRAVENOUS

## 2019-10-08 DIAGNOSIS — N200 Calculus of kidney: Secondary | ICD-10-CM | POA: Insufficient documentation

## 2019-10-08 HISTORY — DX: Calculus of kidney: N20.0

## 2019-10-18 DIAGNOSIS — J449 Chronic obstructive pulmonary disease, unspecified: Secondary | ICD-10-CM | POA: Diagnosis not present

## 2019-10-18 DIAGNOSIS — E7800 Pure hypercholesterolemia, unspecified: Secondary | ICD-10-CM | POA: Diagnosis not present

## 2019-10-18 DIAGNOSIS — I714 Abdominal aortic aneurysm, without rupture: Secondary | ICD-10-CM | POA: Diagnosis not present

## 2019-10-18 DIAGNOSIS — Z0000 Encounter for general adult medical examination without abnormal findings: Secondary | ICD-10-CM | POA: Diagnosis not present

## 2019-10-19 ENCOUNTER — Other Ambulatory Visit: Payer: Self-pay | Admitting: Family Medicine

## 2019-10-19 DIAGNOSIS — I714 Abdominal aortic aneurysm, without rupture, unspecified: Secondary | ICD-10-CM

## 2019-10-25 ENCOUNTER — Ambulatory Visit
Admission: RE | Admit: 2019-10-25 | Discharge: 2019-10-25 | Disposition: A | Discharge status: Home / Self Care | Payer: Medicare HMO | Source: Ambulatory Visit | Attending: Family Medicine | Admitting: Family Medicine

## 2019-10-25 ENCOUNTER — Encounter: Payer: Self-pay | Admitting: *Deleted

## 2019-10-25 ENCOUNTER — Other Ambulatory Visit: Payer: Self-pay

## 2019-10-25 DIAGNOSIS — R69 Illness, unspecified: Secondary | ICD-10-CM | POA: Diagnosis not present

## 2019-10-25 DIAGNOSIS — I714 Abdominal aortic aneurysm, without rupture, unspecified: Secondary | ICD-10-CM

## 2019-10-25 HISTORY — PX: IR RADIOLOGIST EVAL & MGMT: IMG5224
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
## 2019-10-26 ENCOUNTER — Telehealth: Payer: Self-pay | Admitting: *Deleted

## 2019-10-26 ENCOUNTER — Other Ambulatory Visit: Payer: Self-pay | Admitting: Family Medicine

## 2019-10-26 NOTE — Telephone Encounter (Signed)
Faxed referral to [facility] for AAA at the request of Dr. Earleen Newport.Cathren Harsh

## 2019-11-06 DIAGNOSIS — K429 Umbilical hernia without obstruction or gangrene: Secondary | ICD-10-CM | POA: Diagnosis not present

## 2019-11-07 ENCOUNTER — Telehealth: Payer: Self-pay | Admitting: Physician Assistant

## 2019-11-07 NOTE — Telephone Encounter (Signed)
Rec'vd medical records from [facility] forwarded via fax to [place]. Emma Collins/Vein & Vascular 4/7/21fbg

## 2019-11-13 ENCOUNTER — Other Ambulatory Visit: Payer: Self-pay

## 2019-11-13 DIAGNOSIS — I713 Abdominal aortic aneurysm, ruptured, unspecified: Secondary | ICD-10-CM

## 2019-11-29 ENCOUNTER — Ambulatory Visit
Admission: RE | Admit: 2019-11-29 | Discharge: 2019-11-29 | Disposition: A | Discharge status: Home / Self Care | Payer: Medicare HMO | Source: Ambulatory Visit | Attending: Surgery | Admitting: Surgery

## 2019-11-29 ENCOUNTER — Other Ambulatory Visit: Payer: Self-pay

## 2019-11-29 DIAGNOSIS — I713 Abdominal aortic aneurysm, ruptured, unspecified: Secondary | ICD-10-CM

## 2019-11-29 DIAGNOSIS — E7800 Pure hypercholesterolemia, unspecified: Secondary | ICD-10-CM | POA: Diagnosis not present

## 2019-11-29 DIAGNOSIS — I714 Abdominal aortic aneurysm, without rupture: Secondary | ICD-10-CM | POA: Diagnosis not present

## 2019-11-29 DIAGNOSIS — Z01818 Encounter for other preprocedural examination: Secondary | ICD-10-CM | POA: Diagnosis not present

## 2019-11-29 DIAGNOSIS — Z79899 Other long term (current) drug therapy: Secondary | ICD-10-CM | POA: Diagnosis not present

## 2019-11-29 MED ORDER — IOPAMIDOL (ISOVUE-370) INJECTION 76%
75.0000 mL | Freq: Once | INTRAVENOUS | Status: AC | PRN
  Administered 2019-11-29: 75 mL via INTRAVENOUS

## 2019-12-03 ENCOUNTER — Encounter: Payer: Self-pay | Admitting: Surgery

## 2019-12-03 ENCOUNTER — Other Ambulatory Visit: Payer: Self-pay

## 2019-12-03 ENCOUNTER — Ambulatory Visit (INDEPENDENT_AMBULATORY_CARE_PROVIDER_SITE_OTHER): Payer: Medicare HMO | Admitting: Surgery

## 2019-12-03 VITALS — BP 144/79 | HR 67 | Temp 98.3°F | Resp 20 | Ht 68.0 in | Wt 181.0 lb

## 2019-12-03 DIAGNOSIS — I714 Abdominal aortic aneurysm, without rupture, unspecified: Secondary | ICD-10-CM

## 2019-12-03 NOTE — Progress Notes (Signed)
[facility] [place]  Patient name: Adam Jordan MRN: [id number] DOB: 28-Dec-1945 Sex: male   REQUESTING PROVIDER:   Dr. Trenton Gammon   REASON FOR CONSULT:    AAA  HISTORY OF PRESENT ILLNESS:   Adam Jordan is a 74 y.o. male, who is referred for evaluation of a abdominal aortic aneurysm which was detected during work-up for back pain.  He does not have any current abdominal pain.  He is a longtime smoker with COPD.  He does not currently smoke.  He takes a statin for hypercholesterolemia and is medically managed for hypertension.  PAST MEDICAL HISTORY    Past Medical History:  Diagnosis Date  . Anxiety   . Arthritis    osteoarthrititis- knees and most joints.  Marland Kitchen COPD (chronic obstructive pulmonary disease) (HCC)    moderate -no regular use of inhalers- rare use of oxygen as sexual activity  . GERD (gastroesophageal reflux disease)   . Hyperlipidemia   . Hypertension   . Neuromuscular disorder ([place])    right arm ? nerve, occ. gets numbness, tingling in 2 middle fingers occasionally.     FAMILY HISTORY   History reviewed. No pertinent family history.  SOCIAL HISTORY:   Social History   Socioeconomic History  . Marital status: Married    Spouse name: Not on file  . Number of children: Not on file  . Years of education: Not on file  . Highest education level: Not on file  Occupational History  . Not on file  Tobacco Use  . Smoking status: Former Research scientist (life sciences)  . Smokeless tobacco: Never Used  Substance and Sexual Activity  . Alcohol use: Yes    Comment: Quit 12'16  . Drug use: No  . Sexual activity: Yes  Other Topics Concern  . Not on file  Social History Narrative  . Not on file   Social Determinants of Health   Financial Resource Strain:   . Difficulty of Paying Living Expenses:   Food Insecurity:   . Worried About Charity fundraiser in the Last Year:   . Arboriculturist in the Last Year:     Transportation Needs:   . Film/video editor (Medical):   Marland Kitchen Lack of Transportation (Non-Medical):   Physical Activity:   . Days of Exercise per Week:   . Minutes of Exercise per Session:   Stress:   . Feeling of Stress :   Social Connections:   . Frequency of Communication with Friends and Family:   . Frequency of Social Gatherings with Friends and Family:   . Attends Religious Services:   . Active Member of Clubs or Organizations:   . Attends Archivist Meetings:   Marland Kitchen Marital Status:   Intimate Partner Violence:   . Fear of Current or Ex-Partner:   . Emotionally Abused:   Marland Kitchen Physically Abused:   . Sexually Abused:     ALLERGIES:    Allergies  Allergen Reactions  . Losartan Anaphylaxis  . Peanut-Containing Drug Products Anaphylaxis  . Amlodipine Other (See Comments)    Lightheaded.    CURRENT MEDICATIONS:    Current Outpatient Medications  Medication Sig Dispense Refill  . albuterol (PROVENTIL) (2.5 MG/3ML) 0.083% nebulizer solution Take 2.5 mg by nebulization every 6 (six) hours as needed for wheezing or shortness of breath.    Marland Kitchen aspirin EC 325 MG tablet Take 1 tablet (325 mg total) by mouth 2 (two) times daily. 60 tablet 0  .  atorvastatin (LIPITOR) 20 MG tablet Take 20 mg by mouth at bedtime.    . clonazePAM (KLONOPIN) 1 MG tablet Take 1 tablet (1 mg total) by mouth 2 (two) times daily as needed for anxiety. 60 tablet 5  . meloxicam (MOBIC) 15 MG tablet Take 15 mg by mouth at bedtime.    . Multiple Vitamin (MULTIVITAMIN WITH MINERALS) TABS tablet Take 1 tablet by mouth daily. 30 tablet 5  . oxymetazoline (AFRIN) 0.05 % nasal spray Place 1 spray into both nostrils 2 (two) times daily as needed for congestion.    . predniSONE (DELTASONE) 10 MG tablet PLEASE SEE ATTACHED FOR DETAILED DIRECTIONS    . PROAIR HFA 108 (90 BASE) MCG/ACT inhaler Inhale 2 puffs into the lungs every 6 (six) hours as needed for wheezing or shortness of breath.   2  . sildenafil  (REVATIO) 20 MG tablet Take 40 mg by mouth daily as needed for erectile dysfunction.     No current facility-administered medications for this visit.    REVIEW OF SYSTEMS:   [X]  denotes positive finding, [ ]  denotes negative finding Cardiac  Comments:  Chest pain or chest pressure:    Shortness of breath upon exertion:    Short of breath when lying flat:    Irregular heart rhythm:        Vascular    Pain in calf, thigh, or hip brought on by ambulation:    Pain in feet at night that wakes you up from your sleep:     Blood clot in your veins:    Leg swelling:         Pulmonary    Oxygen at home:    Productive cough:     Wheezing:         Neurologic    Sudden weakness in arms or legs:     Sudden numbness in arms or legs:     Sudden onset of difficulty speaking or slurred speech:    Temporary loss of vision in one eye:     Problems with dizziness:         Gastrointestinal    Blood in stool:      Vomited blood:         Genitourinary    Burning when urinating:     Blood in urine:        Psychiatric    Major depression:         Hematologic    Bleeding problems:    Problems with blood clotting too easily:        Skin    Rashes or ulcers:        Constitutional    Fever or chills:     PHYSICAL EXAM:   Vitals:   12/03/19 1034  BP: (!) 144/79  Pulse: 67  Resp: 20  Temp: 98.3 F (36.8 C)  SpO2: 97%  Weight: 181 lb (82.1 kg)  Height: 5\' 8"  (1.727 m)    GENERAL: The patient is a well-nourished male, in no acute distress. The vital signs are documented above. CARDIAC: There is a regular rate and rhythm.  VASCULAR: Palpable posterior tibial pulses bilaterally PULMONARY: Nonlabored respirations ABDOMEN: Soft and non-tender.  + pulsatile mass  MUSCULOSKELETAL: There are no major deformities or cyanosis. NEUROLOGIC: No focal weakness or paresthesias are detected. SKIN: There are no ulcers or rashes noted. PSYCHIATRIC: The patient has a normal  affect.  STUDIES:   I have reviewed the following CTA: Bilobed abdominal aortic aneurysm measuring  3.7 cm at the level of the right renal artery and 5.3 cm just below the IMA. Aneurysm ins at the aortic bifurcation.  Aortic atherosclerosis. Mild-to-moderate mural plaque in the abdominal aorta.  Centrilobular and paraseptal emphysema.  Coronary artery disease.  Scarring in the right middle lobe and lingula.  Colonic diverticulosis.  Left inguinal hernia containing fat.  ASSESSMENT and PLAN   AAA: Maximum aortic diameter is 5.3 cm.  Patient does have ectasia around the distal section.  I will reevaluate his scan on Terra recon to determine if he is a candidate for a fenestrated device.  My suspicion is that he is going to need a four-vessel device which would require going to [facility].  I will review his studies and schedule a virtual visit for follow-up in 1 week.   Leia Alf, MD, FACS [facility] [phone] Pager [phone]

## 2019-12-06 DIAGNOSIS — M5136 Other intervertebral disc degeneration, lumbar region: Secondary | ICD-10-CM | POA: Diagnosis not present

## 2019-12-06 DIAGNOSIS — I719 Aortic aneurysm of unspecified site, without rupture: Secondary | ICD-10-CM | POA: Diagnosis not present

## 2019-12-06 DIAGNOSIS — J302 Other seasonal allergic rhinitis: Secondary | ICD-10-CM | POA: Diagnosis not present

## 2019-12-06 DIAGNOSIS — J449 Chronic obstructive pulmonary disease, unspecified: Secondary | ICD-10-CM | POA: Diagnosis not present

## 2019-12-06 DIAGNOSIS — E7800 Pure hypercholesterolemia, unspecified: Secondary | ICD-10-CM | POA: Diagnosis not present

## 2019-12-12 ENCOUNTER — Ambulatory Visit: Payer: Medicare HMO

## 2019-12-17 ENCOUNTER — Ambulatory Visit (INDEPENDENT_AMBULATORY_CARE_PROVIDER_SITE_OTHER): Payer: Self-pay | Admitting: Surgery

## 2019-12-17 ENCOUNTER — Other Ambulatory Visit: Payer: Self-pay

## 2019-12-17 DIAGNOSIS — I714 Abdominal aortic aneurysm, without rupture, unspecified: Secondary | ICD-10-CM

## 2019-12-17 NOTE — Progress Notes (Signed)
I spoke with the patient today about his aneurysm.  Because of the suprarenal component, I think he needs to be treated with a branched graft at [facility].  I am making a referral to Dr. Sammuel Hines today.  [facility] will reach out to him likely this week.

## 2020-01-04 DIAGNOSIS — I739 Peripheral vascular disease, unspecified: Secondary | ICD-10-CM | POA: Diagnosis not present

## 2020-01-04 DIAGNOSIS — Z01810 Encounter for preprocedural cardiovascular examination: Secondary | ICD-10-CM | POA: Diagnosis not present

## 2020-01-04 DIAGNOSIS — I6522 Occlusion and stenosis of left carotid artery: Secondary | ICD-10-CM | POA: Diagnosis not present

## 2020-01-04 DIAGNOSIS — I10 Essential (primary) hypertension: Secondary | ICD-10-CM | POA: Diagnosis not present

## 2020-01-04 DIAGNOSIS — J439 Emphysema, unspecified: Secondary | ICD-10-CM | POA: Diagnosis not present

## 2020-01-04 DIAGNOSIS — E785 Hyperlipidemia, unspecified: Secondary | ICD-10-CM | POA: Diagnosis not present

## 2020-01-04 DIAGNOSIS — I716 Thoracoabdominal aortic aneurysm, without rupture: Secondary | ICD-10-CM | POA: Diagnosis not present

## 2020-01-04 DIAGNOSIS — R69 Illness, unspecified: Secondary | ICD-10-CM | POA: Diagnosis not present

## 2020-01-10 DIAGNOSIS — I716 Thoracoabdominal aortic aneurysm, without rupture: Secondary | ICD-10-CM | POA: Diagnosis not present

## 2020-01-10 DIAGNOSIS — I10 Essential (primary) hypertension: Secondary | ICD-10-CM | POA: Diagnosis not present

## 2020-01-10 DIAGNOSIS — Z8679 Personal history of other diseases of the circulatory system: Secondary | ICD-10-CM | POA: Diagnosis not present

## 2020-02-06 HISTORY — PX: ABDOMINAL AORTIC ANEURYSM REPAIR: SUR1152

## 2020-02-18 DIAGNOSIS — G8918 Other acute postprocedural pain: Secondary | ICD-10-CM | POA: Diagnosis not present

## 2020-02-18 DIAGNOSIS — Z006 Encounter for examination for normal comparison and control in clinical research program: Secondary | ICD-10-CM | POA: Diagnosis not present

## 2020-02-18 DIAGNOSIS — I714 Abdominal aortic aneurysm, without rupture: Secondary | ICD-10-CM | POA: Diagnosis not present

## 2020-02-18 DIAGNOSIS — J9811 Atelectasis: Secondary | ICD-10-CM | POA: Diagnosis not present

## 2020-02-18 DIAGNOSIS — J449 Chronic obstructive pulmonary disease, unspecified: Secondary | ICD-10-CM | POA: Diagnosis not present

## 2020-02-18 DIAGNOSIS — Z48812 Encounter for surgical aftercare following surgery on the circulatory system: Secondary | ICD-10-CM | POA: Diagnosis not present

## 2020-02-18 DIAGNOSIS — Z9889 Other specified postprocedural states: Secondary | ICD-10-CM | POA: Diagnosis not present

## 2020-02-18 DIAGNOSIS — E785 Hyperlipidemia, unspecified: Secondary | ICD-10-CM | POA: Diagnosis not present

## 2020-02-18 DIAGNOSIS — R69 Illness, unspecified: Secondary | ICD-10-CM | POA: Diagnosis not present

## 2020-02-18 DIAGNOSIS — Z96611 Presence of right artificial shoulder joint: Secondary | ICD-10-CM | POA: Diagnosis not present

## 2020-02-18 DIAGNOSIS — R918 Other nonspecific abnormal finding of lung field: Secondary | ICD-10-CM | POA: Diagnosis not present

## 2020-02-18 DIAGNOSIS — Q2546 Tortuous aortic arch: Secondary | ICD-10-CM | POA: Diagnosis not present

## 2020-02-18 DIAGNOSIS — Z96652 Presence of left artificial knee joint: Secondary | ICD-10-CM | POA: Diagnosis not present

## 2020-02-18 DIAGNOSIS — E869 Volume depletion, unspecified: Secondary | ICD-10-CM | POA: Diagnosis not present

## 2020-02-18 DIAGNOSIS — I517 Cardiomegaly: Secondary | ICD-10-CM | POA: Diagnosis not present

## 2020-02-18 DIAGNOSIS — I10 Essential (primary) hypertension: Secondary | ICD-10-CM | POA: Diagnosis not present

## 2020-02-18 DIAGNOSIS — I739 Peripheral vascular disease, unspecified: Secondary | ICD-10-CM | POA: Diagnosis not present

## 2020-02-18 DIAGNOSIS — I716 Thoracoabdominal aortic aneurysm, without rupture: Secondary | ICD-10-CM | POA: Diagnosis not present

## 2020-02-18 DIAGNOSIS — D72829 Elevated white blood cell count, unspecified: Secondary | ICD-10-CM | POA: Diagnosis not present

## 2020-02-18 DIAGNOSIS — M1990 Unspecified osteoarthritis, unspecified site: Secondary | ICD-10-CM | POA: Diagnosis not present

## 2020-02-18 DIAGNOSIS — Z95828 Presence of other vascular implants and grafts: Secondary | ICD-10-CM | POA: Diagnosis not present

## 2020-02-18 DIAGNOSIS — Z01810 Encounter for preprocedural cardiovascular examination: Secondary | ICD-10-CM | POA: Diagnosis not present

## 2020-02-19 DIAGNOSIS — I716 Thoracoabdominal aortic aneurysm, without rupture, unspecified: Secondary | ICD-10-CM | POA: Insufficient documentation

## 2020-02-19 DIAGNOSIS — R69 Illness, unspecified: Secondary | ICD-10-CM | POA: Diagnosis not present

## 2020-02-19 DIAGNOSIS — I714 Abdominal aortic aneurysm, without rupture: Secondary | ICD-10-CM | POA: Diagnosis not present

## 2020-02-19 DIAGNOSIS — D72829 Elevated white blood cell count, unspecified: Secondary | ICD-10-CM | POA: Diagnosis not present

## 2020-02-19 DIAGNOSIS — G8918 Other acute postprocedural pain: Secondary | ICD-10-CM | POA: Diagnosis not present

## 2020-02-19 HISTORY — DX: Thoracoabdominal aortic aneurysm, without rupture, unspecified: I71.60

## 2020-02-20 DIAGNOSIS — Z95828 Presence of other vascular implants and grafts: Secondary | ICD-10-CM | POA: Diagnosis not present

## 2020-02-20 DIAGNOSIS — I716 Thoracoabdominal aortic aneurysm, without rupture: Secondary | ICD-10-CM | POA: Diagnosis not present

## 2020-02-20 DIAGNOSIS — Z9889 Other specified postprocedural states: Secondary | ICD-10-CM | POA: Diagnosis not present

## 2020-02-20 DIAGNOSIS — Z48812 Encounter for surgical aftercare following surgery on the circulatory system: Secondary | ICD-10-CM | POA: Diagnosis not present

## 2020-02-20 DIAGNOSIS — I714 Abdominal aortic aneurysm, without rupture: Secondary | ICD-10-CM | POA: Diagnosis not present

## 2020-02-20 DIAGNOSIS — R69 Illness, unspecified: Secondary | ICD-10-CM | POA: Diagnosis not present

## 2020-02-20 DIAGNOSIS — D72829 Elevated white blood cell count, unspecified: Secondary | ICD-10-CM | POA: Diagnosis not present

## 2020-02-20 DIAGNOSIS — I739 Peripheral vascular disease, unspecified: Secondary | ICD-10-CM | POA: Diagnosis not present

## 2020-02-20 DIAGNOSIS — G8918 Other acute postprocedural pain: Secondary | ICD-10-CM | POA: Diagnosis not present

## 2020-02-21 DIAGNOSIS — E869 Volume depletion, unspecified: Secondary | ICD-10-CM | POA: Diagnosis not present

## 2020-02-21 DIAGNOSIS — R918 Other nonspecific abnormal finding of lung field: Secondary | ICD-10-CM | POA: Diagnosis not present

## 2020-02-21 DIAGNOSIS — Q2546 Tortuous aortic arch: Secondary | ICD-10-CM | POA: Diagnosis not present

## 2020-02-22 DIAGNOSIS — F17200 Nicotine dependence, unspecified, uncomplicated: Secondary | ICD-10-CM | POA: Insufficient documentation

## 2020-02-22 DIAGNOSIS — R69 Illness, unspecified: Secondary | ICD-10-CM | POA: Diagnosis not present

## 2020-03-03 DIAGNOSIS — L309 Dermatitis, unspecified: Secondary | ICD-10-CM | POA: Diagnosis not present

## 2020-03-03 DIAGNOSIS — E7800 Pure hypercholesterolemia, unspecified: Secondary | ICD-10-CM | POA: Diagnosis not present

## 2020-03-03 DIAGNOSIS — Z09 Encounter for follow-up examination after completed treatment for conditions other than malignant neoplasm: Secondary | ICD-10-CM | POA: Diagnosis not present

## 2020-03-03 DIAGNOSIS — R69 Illness, unspecified: Secondary | ICD-10-CM | POA: Diagnosis not present

## 2020-03-12 DIAGNOSIS — M1711 Unilateral primary osteoarthritis, right knee: Secondary | ICD-10-CM | POA: Diagnosis not present

## 2020-03-12 DIAGNOSIS — T8484XA Pain due to internal orthopedic prosthetic devices, implants and grafts, initial encounter: Secondary | ICD-10-CM | POA: Diagnosis not present

## 2020-04-04 DIAGNOSIS — H524 Presbyopia: Secondary | ICD-10-CM | POA: Diagnosis not present

## 2020-04-04 DIAGNOSIS — H5203 Hypermetropia, bilateral: Secondary | ICD-10-CM | POA: Diagnosis not present

## 2020-04-04 DIAGNOSIS — H40013 Open angle with borderline findings, low risk, bilateral: Secondary | ICD-10-CM | POA: Diagnosis not present

## 2020-04-04 DIAGNOSIS — H2513 Age-related nuclear cataract, bilateral: Secondary | ICD-10-CM | POA: Diagnosis not present

## 2020-04-30 DIAGNOSIS — H2513 Age-related nuclear cataract, bilateral: Secondary | ICD-10-CM | POA: Diagnosis not present

## 2020-05-27 DIAGNOSIS — E7800 Pure hypercholesterolemia, unspecified: Secondary | ICD-10-CM | POA: Diagnosis not present

## 2020-06-03 DIAGNOSIS — M5136 Other intervertebral disc degeneration, lumbar region: Secondary | ICD-10-CM | POA: Diagnosis not present

## 2020-06-03 DIAGNOSIS — G4700 Insomnia, unspecified: Secondary | ICD-10-CM | POA: Diagnosis not present

## 2020-06-03 DIAGNOSIS — M5030 Other cervical disc degeneration, unspecified cervical region: Secondary | ICD-10-CM | POA: Diagnosis not present

## 2020-06-03 DIAGNOSIS — Z23 Encounter for immunization: Secondary | ICD-10-CM | POA: Diagnosis not present

## 2020-06-03 DIAGNOSIS — E7800 Pure hypercholesterolemia, unspecified: Secondary | ICD-10-CM | POA: Diagnosis not present

## 2020-06-03 DIAGNOSIS — Z95828 Presence of other vascular implants and grafts: Secondary | ICD-10-CM | POA: Diagnosis not present

## 2020-06-03 DIAGNOSIS — M1990 Unspecified osteoarthritis, unspecified site: Secondary | ICD-10-CM | POA: Diagnosis not present

## 2020-06-03 DIAGNOSIS — R229 Localized swelling, mass and lump, unspecified: Secondary | ICD-10-CM | POA: Diagnosis not present

## 2020-06-03 DIAGNOSIS — J449 Chronic obstructive pulmonary disease, unspecified: Secondary | ICD-10-CM | POA: Diagnosis not present

## 2020-06-03 DIAGNOSIS — R7309 Other abnormal glucose: Secondary | ICD-10-CM | POA: Diagnosis not present

## 2020-06-04 DIAGNOSIS — H2512 Age-related nuclear cataract, left eye: Secondary | ICD-10-CM | POA: Diagnosis not present

## 2020-06-04 DIAGNOSIS — H2511 Age-related nuclear cataract, right eye: Secondary | ICD-10-CM | POA: Diagnosis not present

## 2020-06-04 DIAGNOSIS — H25812 Combined forms of age-related cataract, left eye: Secondary | ICD-10-CM | POA: Diagnosis not present

## 2020-06-11 DIAGNOSIS — H2511 Age-related nuclear cataract, right eye: Secondary | ICD-10-CM | POA: Diagnosis not present

## 2020-07-10 DIAGNOSIS — Z0100 Encounter for examination of eyes and vision without abnormal findings: Secondary | ICD-10-CM | POA: Diagnosis not present

## 2020-07-10 DIAGNOSIS — H524 Presbyopia: Secondary | ICD-10-CM | POA: Diagnosis not present

## 2020-08-07 DIAGNOSIS — I716 Thoracoabdominal aortic aneurysm, without rupture: Secondary | ICD-10-CM | POA: Diagnosis not present

## 2020-08-07 DIAGNOSIS — K6389 Other specified diseases of intestine: Secondary | ICD-10-CM | POA: Diagnosis not present

## 2020-08-07 DIAGNOSIS — I10 Essential (primary) hypertension: Secondary | ICD-10-CM | POA: Diagnosis not present

## 2020-08-07 DIAGNOSIS — Z95828 Presence of other vascular implants and grafts: Secondary | ICD-10-CM | POA: Diagnosis not present

## 2020-08-07 DIAGNOSIS — R911 Solitary pulmonary nodule: Secondary | ICD-10-CM | POA: Diagnosis not present

## 2020-08-07 DIAGNOSIS — R69 Illness, unspecified: Secondary | ICD-10-CM | POA: Diagnosis not present

## 2020-08-07 DIAGNOSIS — J449 Chronic obstructive pulmonary disease, unspecified: Secondary | ICD-10-CM | POA: Diagnosis not present

## 2020-08-07 DIAGNOSIS — K429 Umbilical hernia without obstruction or gangrene: Secondary | ICD-10-CM | POA: Diagnosis not present

## 2020-08-07 DIAGNOSIS — K4090 Unilateral inguinal hernia, without obstruction or gangrene, not specified as recurrent: Secondary | ICD-10-CM | POA: Diagnosis not present

## 2020-08-07 DIAGNOSIS — E785 Hyperlipidemia, unspecified: Secondary | ICD-10-CM | POA: Diagnosis not present

## 2020-09-22 DIAGNOSIS — M1711 Unilateral primary osteoarthritis, right knee: Secondary | ICD-10-CM | POA: Diagnosis not present

## 2020-09-22 DIAGNOSIS — T8484XD Pain due to internal orthopedic prosthetic devices, implants and grafts, subsequent encounter: Secondary | ICD-10-CM | POA: Diagnosis not present

## 2020-09-26 DIAGNOSIS — T8484XA Pain due to internal orthopedic prosthetic devices, implants and grafts, initial encounter: Secondary | ICD-10-CM | POA: Diagnosis not present

## 2020-09-26 DIAGNOSIS — Z96652 Presence of left artificial knee joint: Secondary | ICD-10-CM | POA: Diagnosis not present

## 2020-09-26 DIAGNOSIS — M25562 Pain in left knee: Secondary | ICD-10-CM | POA: Diagnosis not present

## 2020-10-09 DIAGNOSIS — H02834 Dermatochalasis of left upper eyelid: Secondary | ICD-10-CM | POA: Diagnosis not present

## 2020-10-09 DIAGNOSIS — H02831 Dermatochalasis of right upper eyelid: Secondary | ICD-10-CM | POA: Diagnosis not present

## 2020-10-09 DIAGNOSIS — Z961 Presence of intraocular lens: Secondary | ICD-10-CM | POA: Diagnosis not present

## 2020-12-10 DIAGNOSIS — Z0000 Encounter for general adult medical examination without abnormal findings: Secondary | ICD-10-CM | POA: Diagnosis not present

## 2020-12-10 DIAGNOSIS — E7800 Pure hypercholesterolemia, unspecified: Secondary | ICD-10-CM | POA: Diagnosis not present

## 2020-12-10 DIAGNOSIS — R7309 Other abnormal glucose: Secondary | ICD-10-CM | POA: Diagnosis not present

## 2020-12-10 DIAGNOSIS — R946 Abnormal results of thyroid function studies: Secondary | ICD-10-CM | POA: Diagnosis not present

## 2020-12-10 DIAGNOSIS — Z5181 Encounter for therapeutic drug level monitoring: Secondary | ICD-10-CM | POA: Diagnosis not present

## 2020-12-17 DIAGNOSIS — Z23 Encounter for immunization: Secondary | ICD-10-CM | POA: Diagnosis not present

## 2020-12-17 DIAGNOSIS — Z0000 Encounter for general adult medical examination without abnormal findings: Secondary | ICD-10-CM | POA: Diagnosis not present

## 2020-12-17 DIAGNOSIS — J449 Chronic obstructive pulmonary disease, unspecified: Secondary | ICD-10-CM | POA: Diagnosis not present

## 2020-12-17 DIAGNOSIS — J302 Other seasonal allergic rhinitis: Secondary | ICD-10-CM | POA: Diagnosis not present

## 2020-12-17 DIAGNOSIS — E7800 Pure hypercholesterolemia, unspecified: Secondary | ICD-10-CM | POA: Diagnosis not present

## 2020-12-23 ENCOUNTER — Ambulatory Visit: Payer: Self-pay | Admitting: General Surgery

## 2020-12-23 DIAGNOSIS — K429 Umbilical hernia without obstruction or gangrene: Secondary | ICD-10-CM | POA: Diagnosis not present

## 2020-12-23 DIAGNOSIS — K4090 Unilateral inguinal hernia, without obstruction or gangrene, not specified as recurrent: Secondary | ICD-10-CM | POA: Diagnosis not present

## 2020-12-23 DIAGNOSIS — D171 Benign lipomatous neoplasm of skin and subcutaneous tissue of trunk: Secondary | ICD-10-CM | POA: Diagnosis not present

## 2021-01-14 NOTE — Progress Notes (Signed)
Surgical Instructions    Your procedure is scheduled on Friday January 23, 2021.  Report to [facility] Main Entrance "A" at 6:30 A.M., then check in with the Admitting office.  Call this number if you have problems the morning of surgery:  [phone]   If you have any questions prior to your surgery date call [phone]: Open Monday-Friday 8am-4pm    Remember:  Do not eat after midnight the night before your surgery  You may drink clear liquids until 5:30 AM the morning of your surgery.   Clear liquids allowed are: Water, Non-Citrus Juices (without pulp), Carbonated Beverages, Clear Tea, Black Coffee Only, and Gatorade    Take these medicines the morning of surgery with A SIP OF WATER               Tylenol- if needed  Klonopin - if needed  Flonase if needed  Eye drops if needed  Proair inhaler if needed, (bring with you on day of surgery)   Follow your surgeon's instructions on when to stop Aspirin.  If no instructions were given by your surgeon then you will need to call the office to get those instructions.       As of today, STOP taking Aleve, Naproxen, Ibuprofen, Motrin, Advil, Goody's, BC's, all herbal medications, fish oil, and all vitamins.          Do not wear jewelry. Do not wear lotions, powders,colognes, or deodorant. Men may shave face and neck. Do not bring valuables to the hospital.            [facility] is not responsible for any belongings or valuables.  Do NOT Smoke (Tobacco/Vaping) or drink Alcohol 24 hours prior to your procedure If you use a CPAP at night, you may bring all equipment for your overnight stay.   Contacts, glasses, dentures or bridgework may not be worn into surgery, please bring cases for these belongings   For patients admitted to the hospital, discharge time will be determined by your treatment team.   Patients discharged the day of surgery will not be allowed to drive home, and someone needs to stay with them for 24 hours.  ONLY 1  SUPPORT PERSON MAY BE PRESENT WHILE YOU ARE IN SURGERY. IF YOU ARE TO BE ADMITTED ONCE YOU ARE IN YOUR ROOM YOU WILL BE ALLOWED TWO (2) VISITORS.  Minor children may have two parents present. Special consideration for safety and communication needs will be reviewed on a case by case basis.  Special instructions:    Oral Hygiene is also important to reduce your risk of infection.  Remember - BRUSH YOUR TEETH THE MORNING OF SURGERY WITH YOUR REGULAR TOOTHPASTE   [facility]- Preparing For Surgery  Before surgery, you can play an important role. Because skin is not sterile, your skin needs to be as free of germs as possible. You can reduce the number of germs on your skin by washing with CHG (chlorahexidine gluconate) Soap before surgery.  CHG is an antiseptic cleaner which kills germs and bonds with the skin to continue killing germs even after washing.     Please do not use if you have an allergy to CHG or antibacterial soaps. If your skin becomes reddened/irritated stop using the CHG.  Do not shave (including legs and underarms) for at least 48 hours prior to first CHG shower. It is OK to shave your face.  Please follow these instructions carefully.     Shower the [employer] BEFORE SURGERY  and the MORNING OF SURGERY with CHG Soap.   If you chose to wash your hair, wash your hair first as usual with your normal shampoo. After you shampoo, rinse your hair and body thoroughly to remove the shampoo.  Then [employer] and genitals (private parts) with your normal soap and rinse thoroughly to remove soap.  After that Use CHG Soap as you would any other liquid soap. You can apply CHG directly to the skin and wash gently with a scrungie or a clean washcloth.   Apply the CHG Soap to your body ONLY FROM THE NECK DOWN.  Do not use on open wounds or open sores. Avoid contact with your eyes, ears, mouth and genitals (private parts). Wash Face and genitals (private parts)  with your normal soap.   Wash  thoroughly, paying special attention to the area where your surgery will be performed.  Thoroughly rinse your body with warm water from the neck down.  DO NOT shower/wash with your normal soap after using and rinsing off the CHG Soap.  Pat yourself dry with a CLEAN TOWEL.  Wear CLEAN PAJAMAS to bed the night before surgery  Place CLEAN SHEETS on your bed the night before your surgery  DO NOT SLEEP WITH PETS.   Day of Surgery:  Take a shower with CHG soap. Wear Clean/Comfortable clothing the morning of surgery Do not apply any deodorants/lotions.   Remember to brush your teeth WITH YOUR REGULAR TOOTHPASTE.   Please read over the following fact sheets that you were given.

## 2021-01-15 ENCOUNTER — Encounter (HOSPITAL_COMMUNITY)
Admission: RE | Admit: 2021-01-15 | Discharge: 2021-01-15 | Disposition: A | Discharge status: Home / Self Care | Payer: Medicare HMO | Source: Ambulatory Visit | Attending: General Surgery | Admitting: General Surgery

## 2021-01-15 ENCOUNTER — Encounter (HOSPITAL_COMMUNITY): Payer: Self-pay

## 2021-01-15 ENCOUNTER — Encounter (HOSPITAL_COMMUNITY): Payer: Self-pay | Admitting: General Surgery

## 2021-01-15 ENCOUNTER — Other Ambulatory Visit: Payer: Self-pay

## 2021-01-15 DIAGNOSIS — I4439 Other atrioventricular block: Secondary | ICD-10-CM | POA: Insufficient documentation

## 2021-01-15 DIAGNOSIS — I4510 Unspecified right bundle-branch block: Secondary | ICD-10-CM | POA: Diagnosis not present

## 2021-01-15 DIAGNOSIS — Z01818 Encounter for other preprocedural examination: Secondary | ICD-10-CM | POA: Diagnosis not present

## 2021-01-15 DIAGNOSIS — I4892 Unspecified atrial flutter: Secondary | ICD-10-CM

## 2021-01-15 HISTORY — DX: Unspecified atrial flutter: I48.92

## 2021-01-15 HISTORY — DX: Thoracoabdominal aortic aneurysm, without rupture: I71.6

## 2021-01-15 HISTORY — DX: Thoracoabdominal aortic aneurysm, without rupture, unspecified: I71.60

## 2021-01-15 LAB — BASIC METABOLIC PANEL
Anion gap: 12 (ref 5–15)
BUN: 13 mg/dL (ref 8–23)
CO2: 23 mmol/L (ref 22–32)
Calcium: 9.8 mg/dL (ref 8.9–10.3)
Chloride: 104 mmol/L (ref 98–111)
Creatinine, Ser: 1.07 mg/dL (ref 0.61–1.24)
GFR, Estimated: >60 mL/min (ref >60)
Glucose, Bld: 113 mg/dL — ABNORMAL HIGH (ref 70–99)
Potassium: 3.8 mmol/L (ref 3.5–5.1)
Sodium: 139 mmol/L (ref 135–145)

## 2021-01-15 LAB — CBC
HCT: 45.8 % (ref 39.0–52.0)
Hemoglobin: 15.2 g/dL (ref 13.0–17.0)
MCH: 31 pg (ref 26.0–34.0)
MCHC: 33.2 g/dL (ref 30.0–36.0)
MCV: 93.3 fL (ref 80.0–100.0)
Platelets: 187 10*3/uL (ref 150–400)
RBC: 4.91 MIL/uL (ref 4.22–5.81)
RDW: 13.2 % (ref 11.5–15.5)
WBC: 10.1 10*3/uL (ref 4.0–10.5)
nRBC: 0 % (ref 0.0–0.2)

## 2021-01-15 NOTE — Progress Notes (Signed)
PCP - [name] [facility] Medical Cardiologist - denies  PPM/ICD - denies Device Orders -  Rep Notified -   Chest x-ray - no  EKG - 01/15/21 Stress Test -no  ECHO -  Cardiac Cath -   Sleep Study -no CPAP - n/a  Fasting Blood Sugar - n/a Checks Blood Sugar _____ times a day  Blood Thinner Instructions:n/a Aspirin Instructions:  ERAS Protcol -clear liquids until 0530 PRE-SURGERY Ensure or G2- no  COVID TEST- n/a pt is ambulatory patient.   Anesthesia review: yes-abnormal EKG  Patient denies shortness of breath, fever, cough and chest pain at PAT appointment   All instructions explained to the patient, with a verbal understanding of the material. Patient agrees to go over the instructions while at home for a better understanding. Patient advised to wear a mask whenever he leaves his home and asked that his wife do the same to avoid contracting Covid 19 prior to surgeryy.  The opportunity to ask questions was provided.

## 2021-01-16 ENCOUNTER — Encounter (HOSPITAL_COMMUNITY): Payer: Self-pay | Admitting: Vascular Surgery

## 2021-01-16 ENCOUNTER — Encounter (HOSPITAL_COMMUNITY): Payer: Self-pay

## 2021-01-16 NOTE — Progress Notes (Signed)
Anesthesia Chart Review:  Case: [id number] Date/Time: 01/23/21 0815   Procedures:      UMBILICAL HERNIA REPAIR WITH MESH     LEFT INGUINAL HERNIA REPAIR WITH MESH (Left)     EXCISION LIPOMA LEFT BUTTOCK (Left)   Anesthesia type: General   Pre-op diagnosis: umbilical hernia, left inguinal hernia, lipoma left buttock   Location: [facility] OR ROOM 08 / [place] OR   Surgeons: Jovita Kussmaul, MD       DISCUSSION: Patient is a 75 year old male scheduled for the above procedure.  History includes former smoker, COPD, HTN, dyspnea, HLD, GERD, anxiety, 5.6 cm thoracoabdominal aortic aneurysm (s/p FEVAR 4 vessel TAMBE 02/19/20, has type 2 endoleak that is being followed; participates in TAMBE study). 2nd COVID-19 vaccine [employer]) 12/04/19.   Last visit with Adam Morning, DO on 12/17/20 for annual exam.  Last vascular surgery follow-up with Dr. Sammuel Hines 08/07/20. Imaging showed patent mesenteric and renal stents, intact FEVAR and stable type II endoleak from IMA. Six month follow-up planned.   Anesthesia APP was not contacted while patient at PAT, but 01/15/21 EKG forwarded by PAT staff to review. EKG showed aflutter at 101 bpm, RBBB (old). Aflutter appears to be new. He did not report seeing a cardiologist. His echo in July 2021 [facility]) showed LVEF 60-65%, normal RV systolic function, PASP 27 mmHg,  no significant valvular disease. No SOB or chest pain per PAT RN interview. Quit alcohol several years ago by notes and denied illicit drug use. Around 3:00 PM on 01/16/21, I attempted to contact Dr. Theda Sers, but her office was already closed. I called Adam Jordan preferred number as well to review his EKG findings, but could only leave a voice message to call me back. I discussed EKG findings with anesthesiologist Josephine Igo, MD who agreed with need for preoperative cardiology evaluation. I notified [facility] triage nurse, and she will work on getting this arranged.    VS: BP 124/67   Pulse 68   Temp 36.8 C (Oral)   Resp 18    Ht 5\' 7"  (1.702 m)   Wt 78.2 kg   SpO2 97%   BMI 26.99 kg/m    PROVIDERS: Adam Morning, DO is PCP [facility]) Vinnie Level, MD is vascular surgeon   LABS: Labs reviewed: Acceptable for surgery. A1c 5.8% 12/10/20 ([facility]).  (all labs ordered are listed, but only abnormal results are displayed)  Labs Reviewed  BASIC METABOLIC PANEL - Abnormal; Notable for the following components:      Result Value   Glucose, Bld 113 (*)    All other components within normal limits  CBC    IMAGES: CT Endo Chest/abd/pelvis 08/07/20 [facility]): Impression: - Fenestrated aortobiiliac endograft repair of bilobed juxtarenal abdominal aortic aneurysm. Overall stable size of the superior and inferior excluded aneurysm sacs measuring 3.9 cm and 5.7 cm, respectively.  Probable type 2 endoleak in the anterior aneurysm sac, most likely with the IMA as the afferent vessel.  - Fat and bowel containing umbilical and left inguinal hernias, similar prior.  - Stable to improved bilateral pulmonary nodules. Attention on follow-up.  XR Abdominal Vanguard 08/07/20 [facility]): Impression: - Four-vessel FEVAR without adverse features.  - Prominent small bowel loops in the abdomen with distal bowel gas which mayrepresent an ileus versus partial obstruction.  PCXR 02/21/20 ([facility]): FINDINGS:  - Low lung volumes accentuate the pulmonary vasculature and interstitium. Heterogeneous opacities at the left lung base, likely atelectasis.  - No pleural effusion or  pneumothorax.  - Stable cardiomediastinal silhouette. Tortuous thoracic aorta. Abdominal aortic endovascular repair.  - Right shoulder arthroplasty.   EKG:  EKG 01/15/21: Atrial flutter with variable A-V block Right bundle branch block T wave abnormality, consider anterior ischemia Abnormal ECG Compared to 07/13/2015, atypical atrial flutter new. Confirmed by Adrian Prows [phone]) on 01/15/2021 10:15:44 PM  EKG 02/18/20 ([facility]):Per Result  Narrative: NORMAL SINUS RHYTHM  RIGHT BUNDLE BRANCH BLOCK  NO PREVIOUS ECGS AVAILABLE  Confirmed by [facility] [phone]) on 02/19/2020 2:35:16 PM   CV: PVL Renal Mesenteric Duplex 08/07/20 [facility]): Final Interpretation  - Right: No evidence of renal artery stenosis. Kidney size and               RI are WNL. There is evidence of flow in the right               renal vein.  - Left:    No evidence of renal artery stenosis. Kidney size and               RI are WNL. There is evidence of flow in the left renal               vein.  - Mesenteric:  Normal celiac artery, SMA, splenic artery and hepatic               artery findings.    Echo 02/18/20 [facility]): Summary    1. The right atrium is upper normal  in size.    2. The aorta at the sinuses of Valsalva (4.3 cm) and ascending aorta (4.0  cm) is mildly dilated.    3. The left ventricle is normal in size with normal wall thickness.    4. The left ventricular systolic function is normal, LVEF is visually  estimated at 60-65%.    5. The aortic valve is probably trileaflet with mildly thickened leaflets  with normal excursion.    6. The right ventricle is normal in size, with normal systolic function.    7. There is no pulmonary hypertension, estimated pulmonary artery systolic  pressure is 27 mmHg.     Carotid US 01/04/20 [facility]): Final Interpretation  Right Carotid: Non-hemodynamically significant plaque noted in the CCA. Plaque                 noted in the ECA. The extracranial vessels were near-normal with                 only minimal wall thickening or plaque.  Left Carotid: There is evidence in the ICA of a less than 40% stenosis.                Non-hemodynamically significant plaque noted in the CCA. Plaque                noted in the ECA.  Vertebrals:  Both vertebral arteries were patent with antegrade flow.  Subclavians: Normal flow hemodynamics were seen in bilateral subclavian               arteries.    Past Medical  History:  Diagnosis Date   Anxiety    Arthritis    osteoarthrititis- knees and most joints.   COPD (chronic obstructive pulmonary disease) (HCC)    moderate -no regular use of inhalers- rare use of oxygen as sexual activity   Dyspnea    outside in hot weather   GERD (gastroesophageal reflux disease)    Hyperlipidemia    Hypertension  Neuromuscular disorder ([place])    right arm ? nerve, occ. gets numbness, tingling in 2 middle fingers occasionally.    Past Surgical History:  Procedure Laterality Date   ABDOMINAL AORTIC ANEURYSM REPAIR  02/06/2020   EYE SURGERY Bilateral    cataract surgery   FINGER ARTHROPLASTY Left    left thumb-Dr. Amedeo Plenty   IR RADIOLOGIST EVAL & MGMT  10/25/2019   KNEE ARTHROSCOPY Left    KNEE SURGERY Left    Bakers cyst x2   SHOULDER ARTHROSCOPY Right    thumb surgery     TONSILLECTOMY     TOTAL KNEE ARTHROPLASTY Left 03/19/2016   Procedure: LEFT TOTAL KNEE ARTHROPLASTY;  Surgeon: Sydnee Cabal, MD;  Location: [facility] ORS;  Service: Orthopedics;  Laterality: Left;   TOTAL SHOULDER REPLACEMENT Right    VASCULAR SURGERY     VASECTOMY      MEDICATIONS:  acetaminophen (TYLENOL) 500 MG tablet   aspirin EC 81 MG tablet   atorvastatin (LIPITOR) 20 MG tablet   clonazePAM (KLONOPIN) 1 MG tablet   fluticasone (FLONASE) 50 MCG/ACT nasal spray   meloxicam (MOBIC) 15 MG tablet   Multiple Vitamin (MULTIVITAMIN WITH MINERALS) TABS tablet   Naphazoline HCl (CLEAR EYES OP)   OVER THE COUNTER MEDICATION   PROAIR HFA 108 (90 BASE) MCG/ACT inhaler   sildenafil (REVATIO) 20 MG tablet   traZODone (DESYREL) 50 MG tablet   triamcinolone cream (KENALOG) 0.1 %   No current facility-administered medications for this encounter.    Myra Gianotti, PA-C Surgical Short Stay/Anesthesiology [facility] Phone [phone] [facility] Phone [phone] 01/16/2021 3:52 PM

## 2021-01-20 DIAGNOSIS — R9431 Abnormal electrocardiogram [ECG] [EKG]: Secondary | ICD-10-CM | POA: Diagnosis not present

## 2021-01-20 DIAGNOSIS — Z01818 Encounter for other preprocedural examination: Secondary | ICD-10-CM | POA: Diagnosis not present

## 2021-01-22 ENCOUNTER — Telehealth (HOSPITAL_COMMUNITY): Payer: Self-pay | Admitting: Vascular Surgery

## 2021-01-22 NOTE — Progress Notes (Signed)
Anesthesia follow-up: On 01/19/21, I was able to confirm with [facility] staff that patient had been made aware on 01/16/21 that EKG was abnormal, and he was being referred to cardiology. On 01/19/21, I also reached out to Adam Morning, DO's staff to relay new finding of aflutter on 01/15/21 EKG and copy of tracing sent. I never received a call back from Adam Jordan, so I reached out again and was able to speak with him on 01/20/21 just after 5:00 PM. We discussed finding of aflutter from his PAT EKG and potential risks/treatment recommendations for this arrhythmia. He reported that he had walked from the parking deck to PAT wearing a face mask and in pain from his back and knee. He was out of breath with a fast heart rate when he arrived to PAT, but no chest pain. He attributed symptoms to face mask, heat, and exacerbation of his chronic pain issues, as they improved with rest. He is not aware of any history of arrhythmias. He says he has not used alcohol in four years, but does admit to drinking two pots of coffee a day but is trying to cut back. He says that Dr. Theda Sers' office called him after reviewing the EKG, and actually had him come in the office on 01/20/21. He says Dr. Theda Sers did not refer to cardiology because Dr. Marlou Starks had already done so. He says the EKG was repeated and believes it not longer showed aflutter, although I do not have a copy of that EKG or report. He was initially told cardiology could not see him until August. Given new onset aflutter, I have contacted [facility] scheduling and [facility] on 01/21/21 to inquire about getting him a sooner visit. If his repeat EKG did in fact show he was back in a SR, then he may ultimately need a long term cardiac monitor to assess atrial arrhythmia burden. I also discussed symptoms that might warrant more urgent evaluation in the ED.   Myra Gianotti, PA-C Surgical Short Stay/Anesthesiology [facility] Phone [phone] [facility] Phone [phone] 01/22/2021 9:33 AM

## 2021-01-23 ENCOUNTER — Ambulatory Visit (HOSPITAL_COMMUNITY): Admission: RE | Admit: 2021-01-23 | Payer: Medicare HMO | Source: Home / Self Care | Admitting: General Surgery

## 2021-01-23 HISTORY — DX: Dyspnea, unspecified: R06.00

## 2021-01-23 SURGERY — REPAIR, HERNIA, UMBILICAL, ADULT
Anesthesia: General

## 2021-02-03 ENCOUNTER — Encounter: Payer: Self-pay | Admitting: Cardiology

## 2021-02-03 ENCOUNTER — Ambulatory Visit (INDEPENDENT_AMBULATORY_CARE_PROVIDER_SITE_OTHER): Payer: Medicare HMO | Admitting: Cardiology

## 2021-02-03 ENCOUNTER — Other Ambulatory Visit: Payer: Self-pay

## 2021-02-03 ENCOUNTER — Telehealth: Payer: Self-pay

## 2021-02-03 VITALS — BP 144/66 | HR 62 | Ht 67.0 in | Wt 173.6 lb

## 2021-02-03 DIAGNOSIS — R69 Illness, unspecified: Secondary | ICD-10-CM | POA: Diagnosis not present

## 2021-02-03 DIAGNOSIS — I4892 Unspecified atrial flutter: Secondary | ICD-10-CM | POA: Diagnosis not present

## 2021-02-03 DIAGNOSIS — E7800 Pure hypercholesterolemia, unspecified: Secondary | ICD-10-CM

## 2021-02-03 DIAGNOSIS — J449 Chronic obstructive pulmonary disease, unspecified: Secondary | ICD-10-CM

## 2021-02-03 DIAGNOSIS — F17200 Nicotine dependence, unspecified, uncomplicated: Secondary | ICD-10-CM

## 2021-02-03 DIAGNOSIS — I10 Essential (primary) hypertension: Secondary | ICD-10-CM

## 2021-02-03 DIAGNOSIS — I716 Thoracoabdominal aortic aneurysm, without rupture, unspecified: Secondary | ICD-10-CM

## 2021-02-03 NOTE — Progress Notes (Signed)
Cardiology Consultation:    Date:  02/03/2021   ID:  Adam, Jordan 09-11-1945, MRN [id number]  PCP:  [name], DO  Cardiologist:  Jenne Campus, MD   Referring MD: Jovita Kussmaul, MD   Chief Complaint  Patient presents with   Medical Clearance    TBD, Hernia repair, Dr. Marlou Starks    History of Present Illness:    Adam Jordan is a 75 y.o. male who is being seen today for the evaluation of paroxysmal atrial flutter at the request of [name], MD. with past medical history significant for COPD, chronic smoker however stopped few weeks ago, status post abdominal arctic aneurysm repaired with stenting.  He does have type II endoleak still.  Essential hypertension, dyslipidemia, history of smoking.  He does have hernia surgery he supposed to have it done however when he went for preop evaluation he was found to be in atrial flutter rate was relatively controlled.  Surgery was canceled and he was referred to [place].  He denies having any palpitations.  There is no chest pain tightness squeezing pressure burning chest.  However, his ability to exercise is limited secondary to chronic left knee problem.  He walks around with a cane.  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  He does not exercise on the regular basis, he is not on any special diet, he quit smoking couple weeks ago.  No family history of premature coronary artery disease.  Past Medical History:  Diagnosis Date   Anxiety    Arthritis    osteoarthrititis- knees and most joints.   COPD (chronic obstructive pulmonary disease) (HCC)    moderate -no regular use of inhalers- rare use of oxygen as sexual activity   Dyspnea    outside in hot weather   GERD (gastroesophageal reflux disease)    Hyperlipidemia    Hypertension    Neuromuscular disorder (HCC)    right arm ? nerve, occ. gets numbness, tingling in 2 middle fingers occasionally.   Thoracoabdominal  aneurysm [facility])    s/p FEVAR 4 Vessel TABME 02/19/20 Dr. Katy Apo    Past Surgical History:  Procedure Laterality Date   ABDOMINAL AORTIC ANEURYSM REPAIR  02/06/2020   EYE SURGERY Bilateral    cataract surgery   FINGER ARTHROPLASTY Left    left thumb-Dr. Amedeo Plenty   IR RADIOLOGIST EVAL & MGMT  10/25/2019   KNEE ARTHROSCOPY Left    KNEE SURGERY Left    Bakers cyst x2   SHOULDER ARTHROSCOPY Right    thumb surgery     TONSILLECTOMY     TOTAL KNEE ARTHROPLASTY Left 03/19/2016   Procedure: LEFT TOTAL KNEE ARTHROPLASTY;  Surgeon: Sydnee Cabal, MD;  Location: [facility]S;  Service: Orthopedics;  Laterality: Left;   TOTAL SHOULDER REPLACEMENT Right    VASCULAR SURGERY     VASECTOMY      Current Medications: Current Meds  Medication Sig   acetaminophen (TYLENOL) 500 MG tablet Take 500 mg by mouth daily as needed for moderate pain or headache.   aspirin EC 81 MG tablet Take 81 mg by mouth daily. Swallow whole.   atorvastatin (LIPITOR) 20 MG tablet Take 20 mg by mouth at bedtime.   clonazePAM (KLONOPIN) 1 MG tablet Take 1 tablet (1 mg total) by mouth 2 (two) times daily as needed for anxiety. (Patient taking differently: Take 1 mg by mouth daily.)   fluticasone (FLONASE)  50 MCG/ACT nasal spray Place 1 spray into both nostrils daily.   meloxicam (MOBIC) 15 MG tablet Take 15 mg by mouth daily as needed for pain.   Multiple Vitamin (MULTIVITAMIN WITH MINERALS) TABS tablet Take 1 tablet by mouth daily. (Patient taking differently: Take 1 tablet by mouth daily. Unknown strength)   Naphazoline HCl (CLEAR EYES OP) Place 1 drop into both eyes as needed (dryness).   OVER THE COUNTER MEDICATION Take 1-3 capsules by mouth at bedtime. Oxy-Powder colon cleanse otc supplement/ Unknown strenght   PROAIR HFA 108 (90 BASE) MCG/ACT inhaler Inhale 2 puffs into the lungs every 6 (six) hours as needed for wheezing or shortness of breath.    sildenafil (REVATIO) 20 MG tablet Take 60 mg by mouth once a week.    traZODone (DESYREL) 50 MG tablet Take 50 mg by mouth at bedtime as needed for sleep.   triamcinolone cream (KENALOG) 0.1 % Apply 1 application topically daily as needed (itching).     Allergies:   Losartan, Amlodipine, and Pollen extract   Social History   Socioeconomic History   Marital status: Married    Spouse name: Not on file   Number of children: Not on file   Years of education: Not on file   Highest education level: Not on file  Occupational History   Not on file  Tobacco Use   Smoking status: Former    Pack years: 0.00   Smokeless tobacco: Never  Vaping Use   Vaping Use: Never used  Substance and Sexual Activity   Alcohol use: Yes    Comment: Quit 12'16   Drug use: No   Sexual activity: Yes  Other Topics Concern   Not on file  Social History Narrative   Not on file   Social Determinants of Health   Financial Resource Strain: Not on file  Food Insecurity: Not on file  Transportation Needs: Not on file  Physical Activity: Not on file  Stress: Not on file  Social Connections: Not on file     Family History: The patient's family history is not on file. ROS:   Please see the history of present illness.    All 14 point review of systems negative except as described per history of present illness.  EKGs/Labs/Other Studies Reviewed:    The following studies were reviewed today: Atrial flutter with controlled ventricular rate, right bundle branch block, nonspecific ST segment changes  EKG:  EKG is  ordered today.  The ekg ordered today demonstrates sinus bradycardia rate 56, right bundle branch block, nonspecific ST segment changes  Recent Labs: 01/15/2021: BUN 13; Creatinine, Ser 1.07; Hemoglobin 15.2; Platelets 187; Potassium 3.8; Sodium 139  Recent Lipid Panel No results found for: CHOL, TRIG, HDL, CHOLHDL, VLDL, LDLCALC, LDLDIRECT  Physical Exam:    VS:  BP (!) 144/66 (BP Location: Left Arm, Patient Position: Sitting)   Pulse 62   Ht 5\' 7"  (1.702 m)    Wt 173 lb 9.6 oz (78.7 kg)   SpO2 92%   BMI 27.19 kg/m     Wt Readings from Last 3 Encounters:  02/03/21 173 lb 9.6 oz (78.7 kg)  01/15/21 172 lb 4.8 oz (78.2 kg)  12/03/19 181 lb (82.1 kg)     GEN:  Well nourished, well developed in no acute distress HEENT: Normal NECK: No JVD; No carotid bruits LYMPHATICS: No lymphadenopathy CARDIAC: Sounds are distant RRR, soft systolic murmur grade 1/6 to 2/6 best heard left border of the sternum, no rubs,  no gallops RESPIRATORY:  Clear to auscultation without rales, wheezing or rhonchi  ABDOMEN: Soft, non-tender, non-distended MUSCULOSKELETAL:  No edema; No deformity  SKIN: Warm and dry NEUROLOGIC:  Alert and oriented x 3 PSYCHIATRIC:  Normal affect   ASSESSMENT:    1. Essential hypertension   2. Hypercholesterolemia   3. Paroxysmal atrial flutter ([place])   4. Thoracoabdominal aortic aneurysm (TAAA) without rupture ([place])   5. COPD, mild ([place])   6. Tobacco use disorder    PLAN:    In order of problems listed above:  Paroxysmal atrial flutter this is first an incidental discovery.  His CHADS2 Vascor equals 3 for his age hypertension as well as vascular disease.  He need to be anticoagulated.  I will ask him to have stool guaiac done if stool guaiac is negative then restart anticoagulation most likely with Eliquis 5 mg twice daily.  Obviously concern is having endoleak however this is a type II endoleak, therefore should be no problem with anticoagulation but I will still send a message to vascular surgeon to make sure it is clear from their point of view. Essential hypertension blood pressure mildly elevated today.  But he said no always normal at home.  We will continue monitoring. Coronary artery disease.  I did review his CT which showed calcification of the coronary arteries on top of that he does have diffuse atherosclerosis as proven by fact that he did have aneurysm.  I think he needs to be evaluated for coronary artery disease  especially for talk about surgery.  His ability of exercise is not sufficient to clear him for surgery, therefore I will ask him to have [place].  As a part of evaluation he will also have echocardiogram done to assess left ventricle ejection fraction as well as look at the size of the atrium. Status post AAA repair.  That being followed by surgery.  We will continue monitoring. Smoking, likely he quit just few weeks ago.  I encouraged him to stay away from smoking.   Medication Adjustments/Labs and Tests Ordered: Current medicines are reviewed at length with the patient today.  Concerns regarding medicines are outlined above.  No orders of the defined types were placed in this encounter.  No orders of the defined types were placed in this encounter.   Signed, Park Liter, MD, [facility]. 02/03/2021 9:21 AM    [place]

## 2021-02-03 NOTE — Telephone Encounter (Signed)
Detailed instructions left on the patient's answering machine. Asked to call back with any questions. S.Ezekial Arns EMTP 

## 2021-02-03 NOTE — Patient Instructions (Signed)
Medication Instructions:  Your physician recommends that you continue on your current medications as directed. Please refer to the Current Medication list given to you today.  *If you need a refill on your cardiac medications before your next appointment, please call your pharmacy*   Lab Work: Your physician recommends that you have labs done in the office today. Your test included  basic metabolic panel, complete blood count and ifob.  If you have labs (blood work) drawn today and your tests are completely normal, you will receive your results only by: [place] (if you have MyChart) OR A paper copy in the mail If you have any lab test that is abnormal or we need to change your treatment, we will call you to review the results.   Testing/Procedures: Your physician has requested that you have a lexiscan myoview. For further information please visit [email or web address]. Please follow instruction sheet, as given.  The test will take approximately 3 to 4 hours to complete; you may bring reading material.  If someone comes with you to your appointment, they will need to remain in the main lobby due to limited space in the testing area.   How to prepare for your Myocardial Perfusion Test: Do not eat or drink 3 hours prior to your test, except you may have water. Do not consume products containing caffeine (regular or decaffeinated) 12 hours prior to your test. (ex: coffee, chocolate, sodas, tea). Do bring a list of your current medications with you.  If not listed below, you may take your medications as normal. Do wear comfortable clothes (no dresses or overalls) and walking shoes, tennis shoes preferred (No heels or open toe shoes are allowed). Do NOT wear cologne, perfume, aftershave, or lotions (deodorant is allowed). If these instructions are not followed, your test will have to be rescheduled.  Your physician has requested that you have an echocardiogram. Echocardiography is a  painless test that uses sound waves to create images of your heart. It provides your doctor with information about the size and shape of your heart and how well your heart's chambers and valves are working. This procedure takes approximately one hour. There are no restrictions for this procedure.   Follow-Up: At [facility], you and your health needs are our priority.  As part of our continuing mission to provide you with exceptional heart care, we have created designated Provider Care Teams.  These Care Teams include your primary Cardiologist (physician) and Advanced Practice Providers (APPs -  Physician Assistants and Nurse Practitioners) who all work together to provide you with the care you need, when you need it.  We recommend signing up for the patient portal called "MyChart".  Sign up information is provided on this After Visit Summary.  MyChart is used to connect with patients for Virtual Visits (Telemedicine).  Patients are able to view lab/test results, encounter notes, upcoming appointments, etc.  Non-urgent messages can be sent to your provider as well.   To learn more about what you can do with MyChart, go to [email or web address].    Your next appointment:   6 week(s)  The format for your next appointment:   In Person  Provider:   Jenne Campus, MD   Other Instructions Cardiac Nuclear Scan A cardiac nuclear scan is a test that is done to check the flow of blood to your heart. It is done when you are resting and when you are exercising. The test looks for problems such as: Not enough blood reaching a portion  of the heart. The heart muscle not working as it should. You may need this test if: You have heart disease. You have had lab results that are not normal. You have had heart surgery or a balloon procedure to open up blocked arteries (angioplasty). You have chest pain. You have shortness of breath. In this test, a special dye (tracer) is put into your bloodstream.  The tracer will travel to your heart. A camera will then take pictures of your heart to see how the tracer moves through your heart. This test is usually done at a hospital and takes 2-4 hours. Tell a doctor about: Any allergies you have. All medicines you are taking, including vitamins, herbs, eye drops, creams, and over-the-counter medicines. Any problems you or family members have had with anesthetic medicines. Any blood disorders you have. Any surgeries you have had. Any medical conditions you have. Whether you are pregnant or may be pregnant. What are the risks? Generally, this is a safe test. However, problems may occur, such as: Serious chest pain and heart attack. This is only a risk if the stress portion of the test is done. Rapid heartbeat. A feeling of warmth in your chest. This feeling usually does not last long. Allergic reaction to the tracer. What happens before the test? Ask your doctor about changing or stopping your normal medicines. This is important. Follow instructions from your doctor about what you cannot eat or drink. Remove your jewelry on the day of the test. What happens during the test? An IV tube will be inserted into one of your veins. Your doctor will give you a small amount of tracer through the IV tube. You will wait for 20-40 minutes while the tracer moves through your bloodstream. Your heart will be monitored with an electrocardiogram (ECG). You will lie down on an exam table. Pictures of your heart will be taken for about 15-20 minutes. You may also have a stress test. For this test, one of these things may be done: You will be asked to exercise on a treadmill or a stationary bike. You will be given medicines that will make your heart work harder. This is done if you are unable to exercise. When blood flow to your heart has peaked, a tracer will again be given through the IV tube. After 20-40 minutes, you will get back on the exam table. More pictures  will be taken of your heart. Depending on the tracer that is used, more pictures may need to be taken 3-4 hours later. Your IV tube will be removed when the test is over. The test may vary among doctors and hospitals. What happens after the test? Ask your doctor: Whether you can return to your normal schedule, including diet, activities, and medicines. Whether you should drink more fluids. This will help to remove the tracer from your body. Drink enough fluid to keep your pee (urine) pale yellow. Ask your doctor, or the department that is doing the test: When will my results be ready? How will I get my results? Summary A cardiac nuclear scan is a test that is done to check the flow of blood to your heart. Tell your doctor whether you are pregnant or may be pregnant. Before the test, ask your doctor about changing or stopping your normal medicines. This is important. Ask your doctor whether you can return to your normal activities. You may be asked to drink more fluids. This information is not intended to replace advice given to you by  your health care provider. Make sure you discuss any questions you have with your health care provider. Document Revised: 11/08/2018 Document Reviewed: 01/02/2018 Elsevier Patient Education  2021 [employer].    Echocardiogram An echocardiogram is a test that uses sound waves (ultrasound) to produce images of the heart. Images from an echocardiogram can provide important information about: Heart size and shape. The size and thickness and movement of your heart's walls. Heart muscle function and strength. Heart valve function or if you have stenosis. Stenosis is when the heart valves are too narrow. If blood is flowing backward through the heart valves (regurgitation). A tumor or infectious growth around the heart valves. Areas of heart muscle that are not working well because of poor blood flow or injury from a heart attack. Aneurysm detection. An  aneurysm is a weak or damaged part of an artery wall. The wall bulges out from the normal force of blood pumping through the body. Tell a health care provider about: Any allergies you have. All medicines you are taking, including vitamins, herbs, eye drops, creams, and over-the-counter medicines. Any blood disorders you have. Any surgeries you have had. Any medical conditions you have. Whether you are pregnant or may be pregnant. What are the risks? Generally, this is a safe test. However, problems may occur, including an allergic reaction to dye (contrast) that may be used during the test. What happens before the test? No specific preparation is needed. You may eat and drink normally. What happens during the test? You will take off your clothes from the waist up and put on a hospital gown. Electrodes or electrocardiogram (ECG)patches may be placed on your chest. The electrodes or patches are then connected to a device that monitors your heart rate and rhythm. You will lie down on a table for an ultrasound exam. A gel will be applied to your chest to help sound waves pass through your skin. A handheld device, called a transducer, will be pressed against your chest and moved over your heart. The transducer produces sound waves that travel to your heart and bounce back (or "echo" back) to the transducer. These sound waves will be captured in real-time and changed into images of your heart that can be viewed on a video monitor. The images will be recorded on a computer and reviewed by your health care provider. You may be asked to change positions or hold your breath for a short time. This makes it easier to get different views or better views of your heart. In some cases, you may receive contrast through an IV in one of your veins. This can improve the quality of the pictures from your heart. The procedure may vary among health care providers and hospitals.    What can I expect after the  test? You may return to your normal, everyday life, including diet, activities, and medicines, unless your health care provider tells you not to do that. Follow these instructions at home: It is up to you to get the results of your test. Ask your health care provider, or the department that is doing the test, when your results will be ready. Keep all follow-up visits. This is important. Summary An echocardiogram is a test that uses sound waves (ultrasound) to produce images of the heart. Images from an echocardiogram can provide important information about the size and shape of your heart, heart muscle function, heart valve function, and other possible heart problems. You do not need to do anything to prepare before  this test. You may eat and drink normally. After the echocardiogram is completed, you may return to your normal, everyday life, unless your health care provider tells you not to do that. This information is not intended to replace advice given to you by your health care provider. Make sure you discuss any questions you have with your health care provider. Document Revised: 03/11/2020 Document Reviewed: 03/11/2020 Elsevier Patient Education  2021 [employer].

## 2021-02-04 ENCOUNTER — Telehealth: Payer: Self-pay

## 2021-02-04 ENCOUNTER — Ambulatory Visit (INDEPENDENT_AMBULATORY_CARE_PROVIDER_SITE_OTHER): Payer: Medicare HMO

## 2021-02-04 DIAGNOSIS — I4892 Unspecified atrial flutter: Secondary | ICD-10-CM

## 2021-02-04 LAB — CBC WITH DIFFERENTIAL/PLATELET
Basophils Absolute: 0.1 10*3/uL (ref 0.0–0.2)
Basos: 1 %
EOS (ABSOLUTE): 0.2 10*3/uL (ref 0.0–0.4)
Eos: 2 %
Hematocrit: 43.8 % (ref 37.5–51.0)
Hemoglobin: 15.1 g/dL (ref 13.0–17.7)
Immature Grans (Abs): 0 10*3/uL (ref 0.0–0.1)
Immature Granulocytes: 0 %
Lymphocytes Absolute: 1.6 10*3/uL (ref 0.7–3.1)
Lymphs: 17 %
MCH: 31.4 pg (ref 26.6–33.0)
MCHC: 34.5 g/dL (ref 31.5–35.7)
MCV: 91 fL (ref 79–97)
Monocytes Absolute: 0.8 10*3/uL (ref 0.1–0.9)
Monocytes: 8 %
Neutrophils Absolute: 6.7 10*3/uL (ref 1.4–7.0)
Neutrophils: 72 %
Platelets: 202 10*3/uL (ref 150–450)
RBC: 4.81 x10E6/uL (ref 4.14–5.80)
RDW: 12 % (ref 11.6–15.4)
WBC: 9.4 10*3/uL (ref 3.4–10.8)

## 2021-02-04 LAB — BASIC METABOLIC PANEL
BUN/Creatinine Ratio: 14 (ref 10–24)
BUN: 13 mg/dL (ref 8–27)
CO2: 23 mmol/L (ref 20–29)
Calcium: 9.3 mg/dL (ref 8.6–10.2)
Chloride: 103 mmol/L (ref 96–106)
Creatinine, Ser: 0.96 mg/dL (ref 0.76–1.27)
Glucose: 102 mg/dL — ABNORMAL HIGH (ref 65–99)
Potassium: 4.4 mmol/L (ref 3.5–5.2)
Sodium: 138 mmol/L (ref 134–144)
eGFR: 83 mL/min/{1.73_m2} (ref >59)

## 2021-02-04 MED ORDER — TECHNETIUM TC 99M TETROFOSMIN IV KIT
32.5000 | PACK | Freq: Once | INTRAVENOUS | Status: AC | PRN
  Administered 2021-02-04: 32.5 via INTRAVENOUS

## 2021-02-04 MED ORDER — REGADENOSON 0.4 MG/5ML IV SOLN
0.4000 mg | Freq: Once | INTRAVENOUS | Status: AC
  Administered 2021-02-04: 0.4 mg via INTRAVENOUS

## 2021-02-04 MED ORDER — TECHNETIUM TC 99M TETROFOSMIN IV KIT
9.6000 | PACK | Freq: Once | INTRAVENOUS | Status: AC | PRN
  Administered 2021-02-04: 9.6 via INTRAVENOUS

## 2021-02-04 NOTE — Telephone Encounter (Signed)
Left message on patients voicemail to please return our call.   

## 2021-02-04 NOTE — Telephone Encounter (Signed)
-----   Message from Park Liter, MD sent at 02/04/2021 12:42 PM EDT ----- Labs are looking good, continue present management

## 2021-02-05 ENCOUNTER — Telehealth: Payer: Self-pay

## 2021-02-05 LAB — MYOCARDIAL PERFUSION IMAGING
LV dias vol: 111 mL (ref 62–150)
LV sys vol: 41 mL
Peak HR: 77 {beats}/min
Rest HR: 53 {beats}/min
SDS: 3
SRS: 3
SSS: 6
TID: 1.36

## 2021-02-05 LAB — FECAL OCCULT BLOOD, IMMUNOCHEMICAL: Fecal Occult Bld: NEGATIVE

## 2021-02-05 NOTE — Telephone Encounter (Signed)
Left message on patients voicemail to please return our call.   

## 2021-02-05 NOTE — Telephone Encounter (Signed)
-----   Message from Gita Kudo, RN sent at 02/04/2021  3:11 PM EDT -----  ----- Message ----- From: Park Liter, MD Sent: 02/04/2021  12:42 PM EDT To: Senaida Ores, RN  Labs are looking good, continue present management

## 2021-02-06 ENCOUNTER — Telehealth: Payer: Self-pay

## 2021-02-06 DIAGNOSIS — I10 Essential (primary) hypertension: Secondary | ICD-10-CM

## 2021-02-06 MED ORDER — APIXABAN 5 MG PO TABS: 5.0000 mg | ORAL_TABLET | Freq: Two times a day (BID) | ORAL | 3 refills | Status: DC

## 2021-02-06 NOTE — Telephone Encounter (Signed)
Spoke with patient regarding results and recommendation.  Patient verbalizes understanding and is agreeable to plan of care. Advised patient to call back with any issues or concerns.  

## 2021-02-06 NOTE — Telephone Encounter (Signed)
-----   Message from Gita Kudo, RN sent at 02/04/2021  3:11 PM EDT -----  ----- Message ----- From: Park Liter, MD Sent: 02/04/2021  12:42 PM EDT To: Senaida Ores, RN  Labs are looking good, continue present management

## 2021-02-06 NOTE — Telephone Encounter (Signed)
Left message on patients voicemail to please return our call.   Letter mailed to him at this time.

## 2021-02-06 NOTE — Telephone Encounter (Signed)
-----   Message from Park Liter, MD sent at 02/06/2021 12:32 PM EDT ----- Stool guaiac is negative, please start Eliquis 5 mg twice daily, stop aspirin patient is taking, we need to check CBC in about 2 weeks

## 2021-02-06 NOTE — Telephone Encounter (Signed)
Pt is returning call.  

## 2021-02-19 ENCOUNTER — Other Ambulatory Visit: Payer: PRIVATE HEALTH INSURANCE

## 2021-02-23 ENCOUNTER — Ambulatory Visit (INDEPENDENT_AMBULATORY_CARE_PROVIDER_SITE_OTHER): Payer: Medicare HMO

## 2021-02-23 ENCOUNTER — Other Ambulatory Visit: Payer: Self-pay

## 2021-02-23 DIAGNOSIS — I4892 Unspecified atrial flutter: Secondary | ICD-10-CM | POA: Diagnosis not present

## 2021-02-23 NOTE — Progress Notes (Signed)
Complete echocardiogram performed.  Jimmy Sharmain Lastra RDCS, RVT  

## 2021-02-24 LAB — ECHOCARDIOGRAM COMPLETE
Area-P 1/2: 2.69 cm2
S' Lateral: 3.4 cm

## 2021-02-25 ENCOUNTER — Telehealth: Payer: Self-pay | Admitting: Cardiology

## 2021-02-25 NOTE — Telephone Encounter (Signed)
Patient is returning call to discuss echo results. °

## 2021-02-25 NOTE — Telephone Encounter (Signed)
Spoke with patient regarding results and recommendation.  Patient verbalizes understanding and is agreeable to plan of care. Advised patient to call back with any issues or concerns.  

## 2021-03-12 DIAGNOSIS — M5136 Other intervertebral disc degeneration, lumbar region: Secondary | ICD-10-CM

## 2021-03-12 DIAGNOSIS — I700 Atherosclerosis of aorta: Secondary | ICD-10-CM

## 2021-03-12 DIAGNOSIS — M51369 Other intervertebral disc degeneration, lumbar region without mention of lumbar back pain or lower extremity pain: Secondary | ICD-10-CM

## 2021-03-12 DIAGNOSIS — Z95828 Presence of other vascular implants and grafts: Secondary | ICD-10-CM | POA: Insufficient documentation

## 2021-03-12 DIAGNOSIS — R0600 Dyspnea, unspecified: Secondary | ICD-10-CM | POA: Insufficient documentation

## 2021-03-12 DIAGNOSIS — G4700 Insomnia, unspecified: Secondary | ICD-10-CM | POA: Insufficient documentation

## 2021-03-12 DIAGNOSIS — K219 Gastro-esophageal reflux disease without esophagitis: Secondary | ICD-10-CM

## 2021-03-12 DIAGNOSIS — M1990 Unspecified osteoarthritis, unspecified site: Secondary | ICD-10-CM | POA: Insufficient documentation

## 2021-03-12 DIAGNOSIS — G709 Myoneural disorder, unspecified: Secondary | ICD-10-CM | POA: Insufficient documentation

## 2021-03-12 HISTORY — DX: Other intervertebral disc degeneration, lumbar region without mention of lumbar back pain or lower extremity pain: M51.369

## 2021-03-12 HISTORY — DX: Atherosclerosis of aorta: I70.0

## 2021-03-12 HISTORY — DX: Other intervertebral disc degeneration, lumbar region: M51.36

## 2021-03-12 HISTORY — DX: Presence of other vascular implants and grafts: Z95.828

## 2021-03-12 HISTORY — DX: Gastro-esophageal reflux disease without esophagitis: K21.9

## 2021-03-13 ENCOUNTER — Encounter: Payer: Self-pay | Admitting: Cardiology

## 2021-03-13 ENCOUNTER — Ambulatory Visit: Payer: Medicare HMO | Admitting: Cardiology

## 2021-03-13 ENCOUNTER — Other Ambulatory Visit: Payer: Self-pay

## 2021-03-13 VITALS — BP 144/62 | HR 71 | Ht 67.0 in | Wt 171.0 lb

## 2021-03-13 DIAGNOSIS — I716 Thoracoabdominal aortic aneurysm, without rupture, unspecified: Secondary | ICD-10-CM

## 2021-03-13 DIAGNOSIS — I10 Essential (primary) hypertension: Secondary | ICD-10-CM | POA: Diagnosis not present

## 2021-03-13 DIAGNOSIS — I4892 Unspecified atrial flutter: Secondary | ICD-10-CM

## 2021-03-13 DIAGNOSIS — E7800 Pure hypercholesterolemia, unspecified: Secondary | ICD-10-CM | POA: Diagnosis not present

## 2021-03-13 NOTE — Patient Instructions (Signed)

## 2021-03-13 NOTE — Progress Notes (Signed)
Cardiology Office Note:    Date:  03/13/2021   ID:  Adam Jordan, DOB [birth year], MRN [id number]  PCP:  [name], DO  Cardiologist:  Jenne Campus, MD    Referring MD: [name], DO   Chief Complaint  Patient presents with   Follow-up  I am doing well  History of Present Illness:    Adam Jordan is a 75 y.o. male with past medical history significant for paroxysmal atrial flutter which is incidental discovery when he was evaluated before surgery, COPD, he quit smoking couple weeks ago, status post aortic aneurysm repair.  He does have endoleak type II.  I did see him for atrial flutter.  He is being anticoagulated he is being anticoagulated for more than 3 weeks already he is doing well he denies have any chest pain tightness squeezing pressure burning chest.  Past Medical History:  Diagnosis Date   Anxiety    Arthritis    osteoarthrititis- knees and most joints.   COPD (chronic obstructive pulmonary disease) (HCC)    moderate -no regular use of inhalers- rare use of oxygen as sexual activity   Dyspnea    outside in hot weather   GERD (gastroesophageal reflux disease)    Hyperlipidemia    Hypertension    Neuromuscular disorder (HCC)    right arm ? nerve, occ. gets numbness, tingling in 2 middle fingers occasionally.   Thoracoabdominal aneurysm [facility])    s/p FEVAR 4 Vessel TABME 02/19/20 Dr. Katy Apo    Past Surgical History:  Procedure Laterality Date   ABDOMINAL AORTIC ANEURYSM REPAIR  02/06/2020   EYE SURGERY Bilateral    cataract surgery   FINGER ARTHROPLASTY Left    left thumb-Dr. Amedeo Plenty   IR RADIOLOGIST EVAL & MGMT  10/25/2019   KNEE ARTHROSCOPY Left    KNEE SURGERY Left    Bakers cyst x2   SHOULDER ARTHROSCOPY Right    thumb surgery     TONSILLECTOMY     TOTAL KNEE ARTHROPLASTY Left 03/19/2016   Procedure: LEFT TOTAL KNEE ARTHROPLASTY;  Surgeon: Sydnee Cabal, MD;  Location: [facility];  Service: Orthopedics;  Laterality: Left;   TOTAL SHOULDER  REPLACEMENT Right    VASCULAR SURGERY     VASECTOMY      Current Medications: Current Meds  Medication Sig   acetaminophen (TYLENOL) 500 MG tablet Take 500 mg by mouth daily as needed for moderate pain or headache.   apixaban (ELIQUIS) 5 MG TABS tablet Take 1 tablet (5 mg total) by mouth 2 (two) times daily.   atorvastatin (LIPITOR) 20 MG tablet Take 20 mg by mouth at bedtime.   clonazePAM (KLONOPIN) 1 MG tablet Take 1 tablet (1 mg total) by mouth 2 (two) times daily as needed for anxiety. (Patient taking differently: Take 1 mg by mouth daily.)   fluticasone (FLONASE) 50 MCG/ACT nasal spray Place 1 spray into both nostrils daily.   meloxicam (MOBIC) 15 MG tablet Take 15 mg by mouth daily as needed for pain.   Multiple Vitamin (MULTIVITAMIN WITH MINERALS) TABS tablet Take 1 tablet by mouth daily. (Patient taking differently: Take 1 tablet by mouth daily. Unknown strength)   Naphazoline HCl (CLEAR EYES OP) Place 1 drop into both eyes as needed (dryness). Unknown strength   OVER THE COUNTER MEDICATION Take 1-3 capsules by mouth at bedtime. Oxy-Powder colon cleanse otc supplement/ Unknown strenght   PROAIR HFA 108 (90 BASE) MCG/ACT inhaler Inhale 2 puffs into the lungs every 6 (six) hours as needed  for wheezing or shortness of breath.    sildenafil (REVATIO) 20 MG tablet Take 60 mg by mouth once a week.   traZODone (DESYREL) 50 MG tablet Take 50 mg by mouth at bedtime as needed for sleep.   triamcinolone cream (KENALOG) 0.1 % Apply 1 application topically daily as needed (itching).     Allergies:   Losartan, Amlodipine, Bee pollen, and Pollen extract   Social History   Socioeconomic History   Marital status: Married    Spouse name: Not on file   Number of children: Not on file   Years of education: Not on file   Highest education level: Not on file  Occupational History   Not on file  Tobacco Use   Smoking status: Former   Smokeless tobacco: Never  Vaping Use   Vaping Use: Never  used  Substance and Sexual Activity   Alcohol use: Yes    Comment: Quit 12'16   Drug use: No   Sexual activity: Yes  Other Topics Concern   Not on file  Social History Narrative   Not on file   Social Determinants of Health   Financial Resource Strain: Not on file  Food Insecurity: Not on file  Transportation Needs: Not on file  Physical Activity: Not on file  Stress: Not on file  Social Connections: Not on file     Family History: The patient's family history is not on file. ROS:   Please see the history of present illness.    All 14 point review of systems negative except as described per history of present illness  EKGs/Labs/Other Studies Reviewed:      Recent Labs: 02/03/2021: BUN 13; Creatinine, Ser 0.96; Hemoglobin 15.1; Platelets 202; Potassium 4.4; Sodium 138  Recent Lipid Panel No results found for: CHOL, TRIG, HDL, CHOLHDL, VLDL, LDLCALC, LDLDIRECT  Physical Exam:    VS:  BP (!) 144/62 (BP Location: Right Arm, Patient Position: Sitting)   Pulse 71   Ht 5\' 7"  (1.702 m)   Wt 171 lb (77.6 kg)   SpO2 96%   BMI 26.78 kg/m     Wt Readings from Last 3 Encounters:  03/13/21 171 lb (77.6 kg)  02/04/21 173 lb (78.5 kg)  02/03/21 173 lb 9.6 oz (78.7 kg)     GEN:  Well nourished, well developed in no acute distress HEENT: Normal NECK: No JVD; No carotid bruits LYMPHATICS: No lymphadenopathy CARDIAC: RRR, no murmurs, no rubs, no gallops RESPIRATORY:  Clear to auscultation without rales, wheezing or rhonchi  ABDOMEN: Soft, non-tender, non-distended MUSCULOSKELETAL:  No edema; No deformity  SKIN: Warm and dry LOWER EXTREMITIES: no swelling NEUROLOGIC:  Alert and oriented x 3 PSYCHIATRIC:  Normal affect   ASSESSMENT:    1. Paroxysmal atrial flutter ([place])   2. Thoracoabdominal aortic aneurysm (TAAA) without rupture ([place])   3. Essential hypertension   4. Hypercholesterolemia    PLAN:    In order of problems listed above:  Paroxysmal atrial flutter his  right is very regular on the physical exam we will do EKG today to confirm the rhythm if he still in atrial f flutter we will make arrangements for ablation. Status post abdominal arctic aneurysm repair.  He is doing well from that point review followed by vascular surgeons. Essential hypertension blood pressure well controlled continue present management. Smoking likely he quit and he stays away from smoking. Dyslipidemia: He is on Lipitor 20 which I will continue we will make arrangements for his fasting lipid profile to  be checked.   Medication Adjustments/Labs and Tests Ordered: Current medicines are reviewed at length with the patient today.  Concerns regarding medicines are outlined above.  No orders of the defined types were placed in this encounter.  Medication changes: No orders of the defined types were placed in this encounter.   Signed, Park Liter, MD, [facility] 03/13/2021 3:56 PM    [place]

## 2021-04-20 IMAGING — CT CT SHOULDER*R* W/O CM
1 series · 12 of 14 positions shown, 15 images · non-contrast
Comparison: CT scan of the chest dated 04/21/2016

CLINICAL DATA: Chronic right shoulder pain. Decreased range of
motion. Previous right shoulder arthroplasty.

EXAM:
CT OF THE UPPER RIGHT EXTREMITY WITHOUT CONTRAST
TECHNIQUE: Multidetector CT imaging of the upper right extremity was performed
according to the standard protocol.

[Series 6: soft (person_name) · axial · 0.49mm/px · z∈[-214,-54]mm · 12 of 96 slices shown, 15 images]
[im 8/96  soft-tissue]
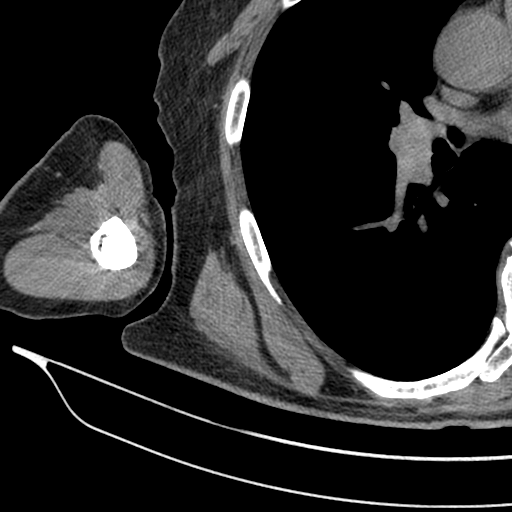
[im 8/96  bone]
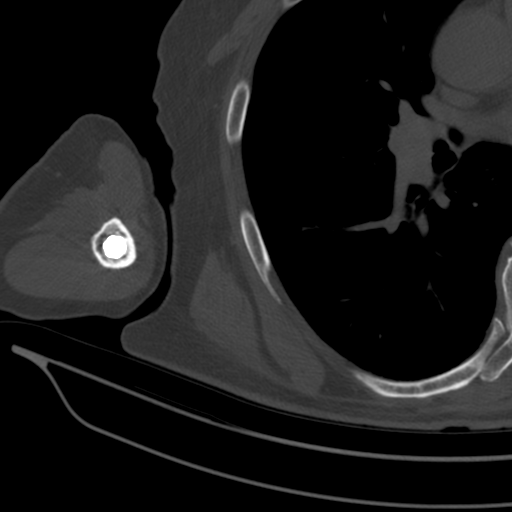
[im 15/96  bone]
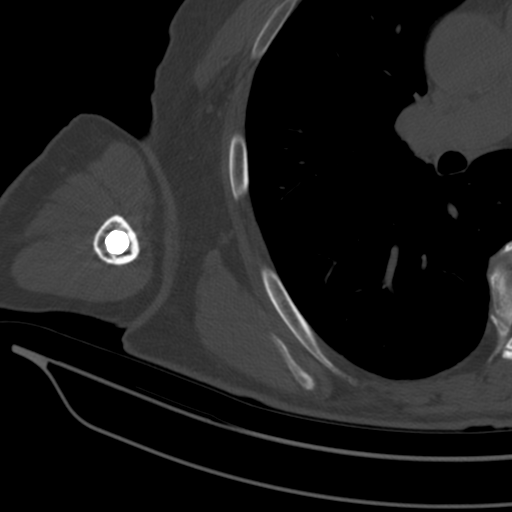
[im 22/96  bone]
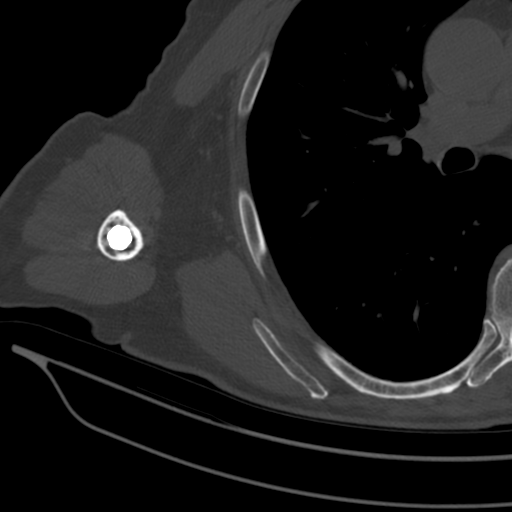
[im 30/96  bone]
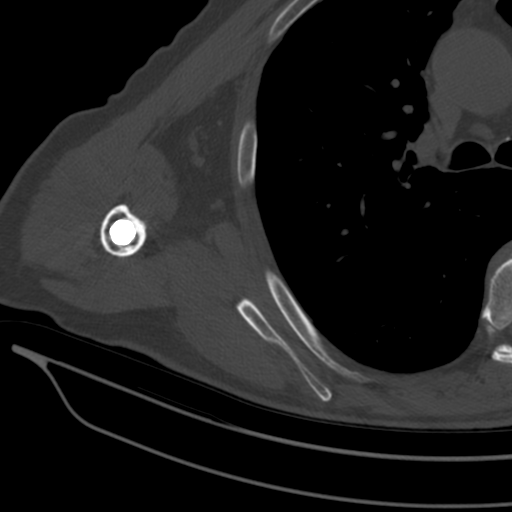
[im 37/96  soft-tissue]
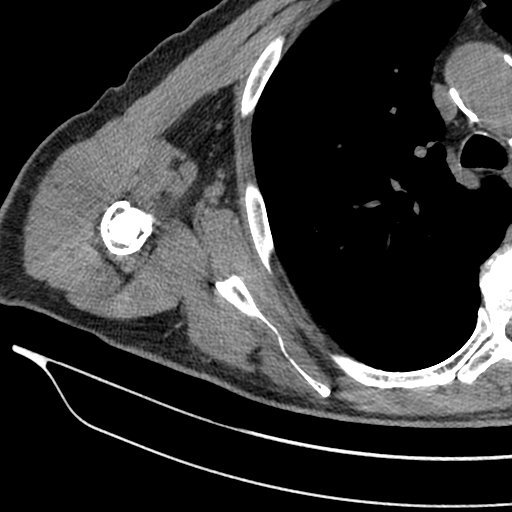
[im 37/96  bone]
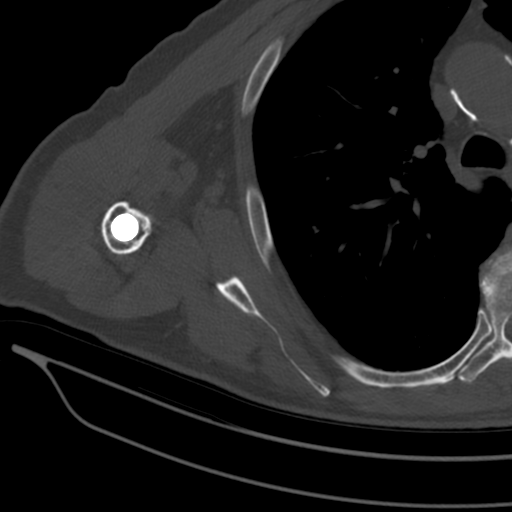
[im 44/96  bone]
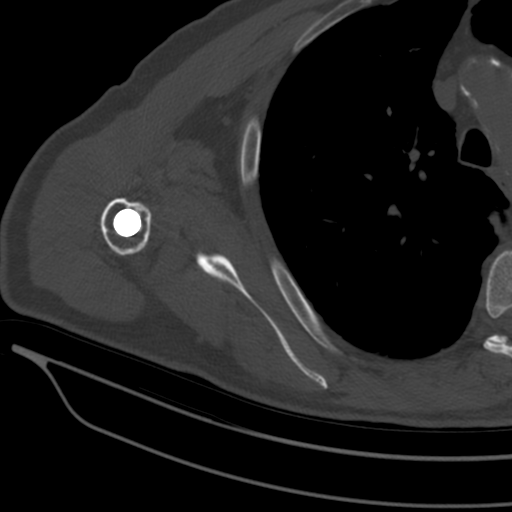
[im 52/96  bone]
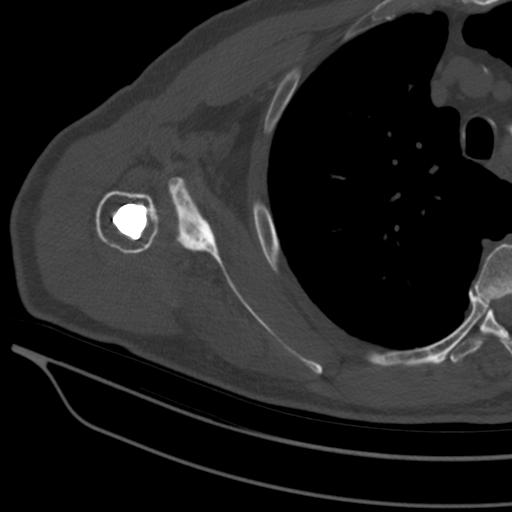
[im 59/96  bone]
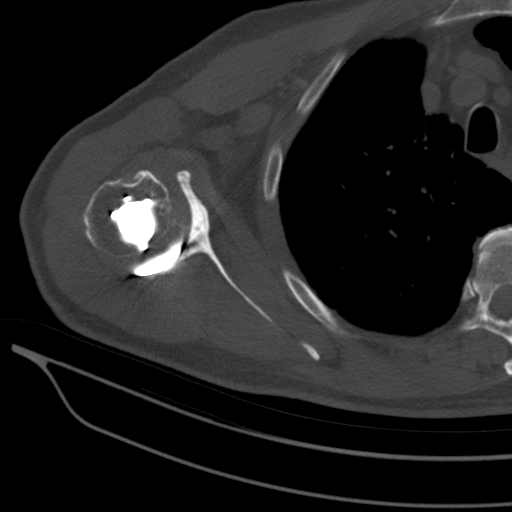
[im 66/96  soft-tissue]
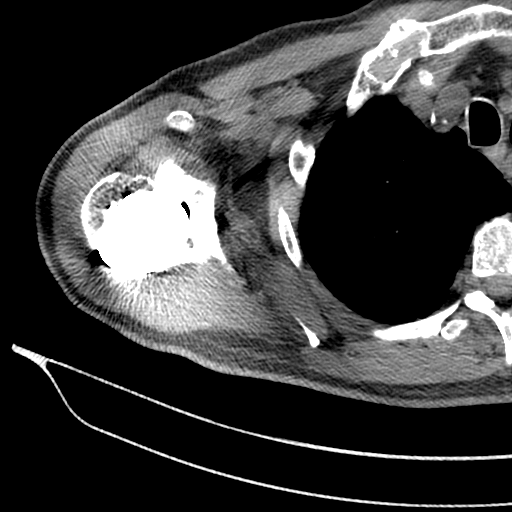
[im 66/96  bone]
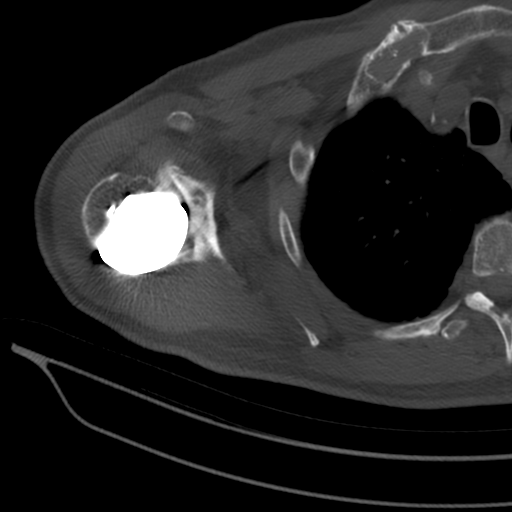
[im 74/96  bone]
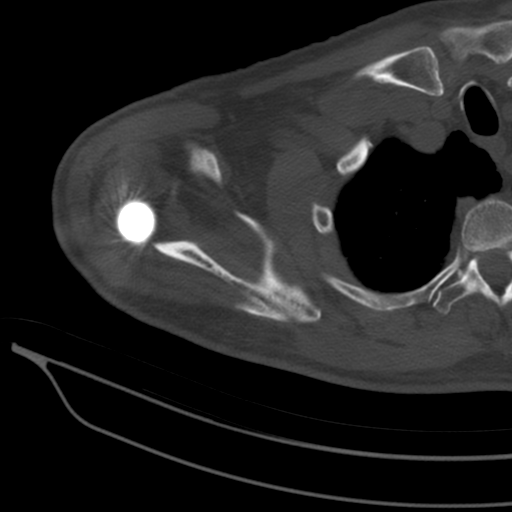
[im 81/96  bone]
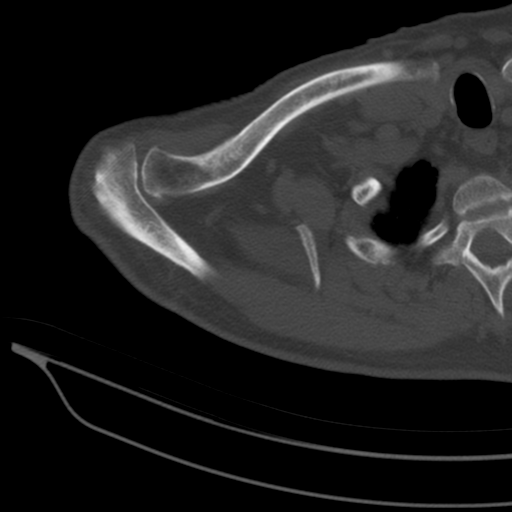
[im 88/96  bone]
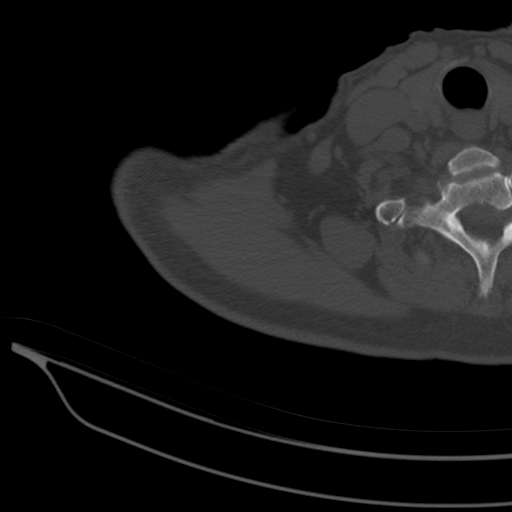

[12 of 14 positions shown; findings below may reference images not displayed]

FINDINGS: Bones/Joint/Cartilage

Proximal humeral prosthesis appears in excellent position with no
evidence of loosening. There is chronic reshaping of the glenoid
with marked thinning of the posterior aspect of the glenoid. No
fractures. AC joint is normal. Small glenohumeral joint effusion.

Muscles and Tendons

No atrophy or edema of the muscles of the rotator cuff.

Soft tissues

Severe emphysema. Stable 5 mm nodule in the right lower lobe. Stable
tiny calcified granuloma in the right upper lobe.
IMPRESSION: 1. No evidence of loosening of the proximal humeral prosthesis.
2. Chronic reshaping of the glenoid with marked thinning of the
posterior aspect of the glenoid.
3. Small glenohumeral joint effusion.
4. Severe emphysema.
5. Stable 5 mm nodule in the right lower lobe.

Emphysema ([id number]).

## 2021-05-01 DIAGNOSIS — J449 Chronic obstructive pulmonary disease, unspecified: Secondary | ICD-10-CM | POA: Diagnosis not present

## 2021-05-01 DIAGNOSIS — E7800 Pure hypercholesterolemia, unspecified: Secondary | ICD-10-CM | POA: Diagnosis not present

## 2021-05-01 DIAGNOSIS — M1990 Unspecified osteoarthritis, unspecified site: Secondary | ICD-10-CM | POA: Diagnosis not present

## 2021-05-19 ENCOUNTER — Telehealth: Payer: Self-pay

## 2021-05-19 NOTE — Telephone Encounter (Signed)
    [facility] Pre-operative Risk Assessment    Request for surgical clearance:  What type of surgery is being performed? Hernia Surgery    When is this surgery scheduled? TBD   What type of clearance is required (medical clearance vs. Pharmacy clearance to hold med vs. Both)? Both  Are there any medications that need to be held prior to surgery and how long?Eliquis holding length not specified    Practice name and name of physician performing surgery? Dr. Autumn Messing at [facility] surgery    What is your office phone number: [phone]    7.   What is your office fax number: [place]   8.   Anesthesia type (None, local, MAC, general) ? General Anesthesia    Basil Dess Jemario Poitras 05/19/2021, 9:11 AM  _________________________________________________________________   (provider comments below)

## 2021-05-19 NOTE — Telephone Encounter (Addendum)
Patient with diagnosis of paroxysmal atrial flutter on Eliquis for anticoagulation.    Procedure: hernia surgery Date of procedure: TBD  CHA2DS2-VASc Score = 3  This indicates a 3.2% annual risk of stroke. The patient's score is based upon: CHF History: 0 HTN History: 1 Diabetes History: 0 Stroke History: 0 Vascular Disease History: 1 Age Score: 1 Gender Score: 0   CrCl 75.98 mL/min Platelet count 202K  Per office protocol, patient can hold Eliquis for 2 days prior to procedure.

## 2021-05-19 NOTE — Telephone Encounter (Signed)
   Name: Adam Jordan  DOB: July 13, 1946  MRN: [id number]   Primary Cardiologist: Jenne Campus, MD  Chart reviewed as part of pre-operative protocol coverage. Patient was contacted 05/19/2021 in reference to pre-operative risk assessment for pending surgery as outlined below.  Adam Jordan was last seen on 03/13/2021 by Dr. Agustin Cree.  Since that day, Adam Jordan has done well without any chest pain or worsening dyspnea on exertion.  Echocardiogram and Myoview obtained in July 2022 were both normal.  Therefore, based on ACC/AHA guidelines, the patient would be at acceptable risk for the planned procedure without further cardiovascular testing.   Per recommendation by our clinical pharmacist, patient will need to hold Eliquis for 2 days prior to the surgery and restart as soon as possible afterward at the surgeon's discretion.  The patient was advised that if he develops new symptoms prior to surgery to contact our office to arrange for a follow-up visit, and he verbalized understanding.  I will route this recommendation to the requesting party via Epic fax function and remove from pre-op pool. Please call with questions.  [place], [place] 05/19/2021, 5:46 PM

## 2021-05-19 NOTE — Telephone Encounter (Signed)
Clinical pharmacist to review Eliquis 

## 2021-06-17 DIAGNOSIS — Z5181 Encounter for therapeutic drug level monitoring: Secondary | ICD-10-CM | POA: Diagnosis not present

## 2021-06-17 DIAGNOSIS — Z125 Encounter for screening for malignant neoplasm of prostate: Secondary | ICD-10-CM | POA: Diagnosis not present

## 2021-06-17 DIAGNOSIS — R7309 Other abnormal glucose: Secondary | ICD-10-CM | POA: Diagnosis not present

## 2021-06-23 DIAGNOSIS — E7800 Pure hypercholesterolemia, unspecified: Secondary | ICD-10-CM | POA: Diagnosis not present

## 2021-06-23 DIAGNOSIS — I4892 Unspecified atrial flutter: Secondary | ICD-10-CM | POA: Diagnosis not present

## 2021-06-23 DIAGNOSIS — J449 Chronic obstructive pulmonary disease, unspecified: Secondary | ICD-10-CM | POA: Diagnosis not present

## 2021-06-23 DIAGNOSIS — R7303 Prediabetes: Secondary | ICD-10-CM | POA: Diagnosis not present

## 2021-06-23 DIAGNOSIS — M1990 Unspecified osteoarthritis, unspecified site: Secondary | ICD-10-CM | POA: Diagnosis not present

## 2021-06-23 DIAGNOSIS — R69 Illness, unspecified: Secondary | ICD-10-CM | POA: Diagnosis not present

## 2021-06-23 DIAGNOSIS — G4700 Insomnia, unspecified: Secondary | ICD-10-CM | POA: Diagnosis not present

## 2021-06-23 DIAGNOSIS — Z9889 Other specified postprocedural states: Secondary | ICD-10-CM | POA: Diagnosis not present

## 2021-06-23 DIAGNOSIS — M25562 Pain in left knee: Secondary | ICD-10-CM | POA: Diagnosis not present

## 2021-06-29 NOTE — Pre-Procedure Instructions (Signed)
Surgical Instructions    Your procedure is scheduled on Friday 07/03/21.   Report to [facility] Main Entrance "A" at 06:30 A.M., then check in with the Admitting office.  Call this number if you have problems the morning of surgery:  [phone]   If you have any questions prior to your surgery date call [phone]: Open Monday-Friday 8am-4pm    Remember:  Do not eat after midnight the night before your surgery  You may drink clear liquids until 05:30 A.M. the morning of your surgery.   Clear liquids allowed are: Water, Non-Citrus Juices (without pulp), Carbonated Beverages, Clear Tea, Black Coffee ONLY (NO MILK, CREAM OR POWDERED CREAMER of any kind), and Gatorade    Take these medicines the morning of surgery with A SIP OF WATER   fluticasone (FLONASE)   Take these medicines if needed:   acetaminophen (TYLENOL)   clonazePAM (KLONOPIN)  PROAIR HFA 108 (90 BASE) MCG/ACT inhaler  Please follow your surgeon's instructions regarding apixaban (ELIQUIS). If you have not received instructions then you need to contact your surgeon's office for instructions.   As of today, STOP taking any Aspirin (unless otherwise instructed by your surgeon) meloxicam (MOBIC), Aleve, Naproxen, Ibuprofen, Motrin, Advil, Goody's, BC's, all herbal medications, fish oil, and all vitamins.     After your COVID test   You are not required to quarantine however you are required to wear a well-fitting mask when you are out and around people not in your household.  If your mask becomes wet or soiled, replace with a new one.  Wash your hands often with soap and water for 20 seconds or clean your hands with an alcohol-based hand sanitizer that contains at least 60% alcohol.  Do not share personal items.  Notify your provider: if you are in close contact with someone who has COVID  or if you develop a fever of 100.4 or greater, sneezing, cough, sore throat, shortness of breath or body aches.              Do not wear jewelry or makeup Do not wear lotions, powders, perfumes/colognes, or deodorant. Do not shave 48 hours prior to surgery.  Men may shave face and neck. Do not bring valuables to the hospital. DO Not wear nail polish, gel polish, artificial nails, or any other type of covering on natural nails including finger and toenails. If patients have artificial nails, gel coating, etc. that need to be removed by a nail salon, please have this removed prior to surgery or surgery may need to be canceled/delayed if the surgeon/ anesthesia feels like the patient is unable to be adequately monitored.             [facility] is not responsible for any belongings or valuables.  Do NOT Smoke (Tobacco/Vaping)  24 hours prior to your procedure  If you use a CPAP at night, you may bring your mask for your overnight stay.   Contacts, glasses, hearing aids, dentures or partials may not be worn into surgery, please bring cases for these belongings   For patients admitted to the hospital, discharge time will be determined by your treatment team.   Patients discharged the day of surgery will not be allowed to drive home, and someone needs to stay with them for 24 hours.  NO VISITORS WILL BE ALLOWED IN PRE-OP WHERE PATIENTS ARE PREPPED FOR SURGERY.  ONLY 1 SUPPORT PERSON MAY BE PRESENT IN THE WAITING ROOM WHILE YOU ARE IN SURGERY.  IF  YOU ARE TO BE ADMITTED, ONCE YOU ARE IN YOUR ROOM YOU WILL BE ALLOWED TWO (2) VISITORS. 1 (ONE) VISITOR MAY STAY OVERNIGHT BUT MUST ARRIVE TO THE ROOM BY 8pm.  Minor children may have two parents present. Special consideration for safety and communication needs will be reviewed on a case by case basis.  Special instructions:    Oral Hygiene is also important to reduce your risk of infection.  Remember - BRUSH YOUR TEETH THE MORNING OF SURGERY WITH YOUR REGULAR TOOTHPASTE   [place]- Preparing For Surgery  Before surgery, you can play an important role. Because skin is  not sterile, your skin needs to be as free of germs as possible. You can reduce the number of germs on your skin by washing with CHG (chlorahexidine gluconate) Soap before surgery.  CHG is an antiseptic cleaner which kills germs and bonds with the skin to continue killing germs even after washing.     Please do not use if you have an allergy to CHG or antibacterial soaps. If your skin becomes reddened/irritated stop using the CHG.  Do not shave (including legs and underarms) for at least 48 hours prior to first CHG shower. It is OK to shave your face.  Please follow these instructions carefully.     Shower the NIGHT BEFORE SURGERY and the MORNING OF SURGERY with CHG Soap.   If you chose to wash your hair, wash your hair first as usual with your normal shampoo. After you shampoo, rinse your hair and body thoroughly to remove the shampoo.  Then [employer] and genitals (private parts) with your normal soap and rinse thoroughly to remove soap.  After that Use CHG Soap as you would any other liquid soap. You can apply CHG directly to the skin and wash gently with a scrungie or a clean washcloth.   Apply the CHG Soap to your body ONLY FROM THE NECK DOWN.  Do not use on open wounds or open sores. Avoid contact with your eyes, ears, mouth and genitals (private parts). Wash Face and genitals (private parts)  with your normal soap.   Wash thoroughly, paying special attention to the area where your surgery will be performed.  Thoroughly rinse your body with warm water from the neck down.  DO NOT shower/wash with your normal soap after using and rinsing off the CHG Soap.  Pat yourself dry with a CLEAN TOWEL.  Wear CLEAN PAJAMAS to bed the night before surgery  Place CLEAN SHEETS on your bed the night before your surgery  DO NOT SLEEP WITH PETS.   Day of Surgery:  Take a shower with CHG soap. Wear Clean/Comfortable clothing the morning of surgery Do not apply any deodorants/lotions.   Remember  to brush your teeth WITH YOUR REGULAR TOOTHPASTE.   Please read over the following fact sheets that you were given.

## 2021-06-30 ENCOUNTER — Other Ambulatory Visit: Payer: Self-pay

## 2021-06-30 ENCOUNTER — Encounter (HOSPITAL_COMMUNITY)
Admission: RE | Admit: 2021-06-30 | Discharge: 2021-06-30 | Disposition: A | Discharge status: Home / Self Care | Payer: Medicare HMO | Source: Ambulatory Visit | Attending: General Surgery | Admitting: General Surgery

## 2021-06-30 ENCOUNTER — Encounter (HOSPITAL_COMMUNITY): Payer: Self-pay

## 2021-06-30 VITALS — BP 135/71 | HR 71 | Temp 98.1°F | Resp 18 | Ht 69.0 in | Wt 175.1 lb

## 2021-06-30 DIAGNOSIS — I10 Essential (primary) hypertension: Secondary | ICD-10-CM | POA: Diagnosis not present

## 2021-06-30 DIAGNOSIS — I4892 Unspecified atrial flutter: Secondary | ICD-10-CM | POA: Diagnosis not present

## 2021-06-30 DIAGNOSIS — Z7901 Long term (current) use of anticoagulants: Secondary | ICD-10-CM | POA: Diagnosis not present

## 2021-06-30 DIAGNOSIS — F419 Anxiety disorder, unspecified: Secondary | ICD-10-CM | POA: Diagnosis not present

## 2021-06-30 DIAGNOSIS — Z01812 Encounter for preprocedural laboratory examination: Secondary | ICD-10-CM | POA: Diagnosis not present

## 2021-06-30 DIAGNOSIS — Z87891 Personal history of nicotine dependence: Secondary | ICD-10-CM | POA: Diagnosis not present

## 2021-06-30 DIAGNOSIS — K4090 Unilateral inguinal hernia, without obstruction or gangrene, not specified as recurrent: Secondary | ICD-10-CM | POA: Diagnosis not present

## 2021-06-30 DIAGNOSIS — K219 Gastro-esophageal reflux disease without esophagitis: Secondary | ICD-10-CM | POA: Insufficient documentation

## 2021-06-30 DIAGNOSIS — J449 Chronic obstructive pulmonary disease, unspecified: Secondary | ICD-10-CM | POA: Diagnosis not present

## 2021-06-30 DIAGNOSIS — Z01818 Encounter for other preprocedural examination: Secondary | ICD-10-CM

## 2021-06-30 DIAGNOSIS — E785 Hyperlipidemia, unspecified: Secondary | ICD-10-CM | POA: Insufficient documentation

## 2021-06-30 DIAGNOSIS — K429 Umbilical hernia without obstruction or gangrene: Secondary | ICD-10-CM | POA: Diagnosis not present

## 2021-06-30 DIAGNOSIS — R69 Illness, unspecified: Secondary | ICD-10-CM | POA: Diagnosis not present

## 2021-06-30 LAB — BASIC METABOLIC PANEL
Anion gap: 8 (ref 5–15)
BUN: 14 mg/dL (ref 8–23)
CO2: 24 mmol/L (ref 22–32)
Calcium: 9.1 mg/dL (ref 8.9–10.3)
Chloride: 103 mmol/L (ref 98–111)
Creatinine, Ser: 0.88 mg/dL (ref 0.61–1.24)
GFR, Estimated: >60 mL/min (ref >60)
Glucose, Bld: 98 mg/dL (ref 70–99)
Potassium: 4.1 mmol/L (ref 3.5–5.1)
Sodium: 135 mmol/L (ref 135–145)

## 2021-06-30 LAB — CBC
HCT: 44.6 % (ref 39.0–52.0)
Hemoglobin: 14.7 g/dL (ref 13.0–17.0)
MCH: 30.7 pg (ref 26.0–34.0)
MCHC: 33 g/dL (ref 30.0–36.0)
MCV: 93.1 fL (ref 80.0–100.0)
Platelets: 198 10*3/uL (ref 150–400)
RBC: 4.79 MIL/uL (ref 4.22–5.81)
RDW: 13.1 % (ref 11.5–15.5)
WBC: 10.1 10*3/uL (ref 4.0–10.5)
nRBC: 0 % (ref 0.0–0.2)

## 2021-06-30 NOTE — Progress Notes (Signed)
PCP - Dr. [name] Cardiologist - Dr. Jenne Campus  PPM/ICD - n/a Device Orders - n/a Rep Notified - n/a  Chest x-ray - n/a EKG - 03/13/21 Stress Test - 02/05/21 ECHO - 02/24/21 Cardiac Cath - denies  Sleep Study - denies CPAP - n/a  Fasting Blood Sugar - n/a Checks Blood Sugar _____ times a day- n/a  Blood Thinner Instructions: Patient states he was instructed to take last dose of Eliquis on 06/30/21. Aspirin Instructions: n/a  ERAS Protcol - Yes PRE-SURGERY Ensure or G2- No  COVID TEST- n/a. Ambulatory Surgery   Anesthesia review: Yes. Patient was supposed to have surgery in June but had to be rescheduled due to abnormal EKG. Patient saw Dr. Agustin Cree on 02/03/21. Cardiology clearance note in Epic 05/19/21.  Patient denies shortness of breath, fever, cough and chest pain at PAT appointment   All instructions explained to the patient, with a verbal understanding of the material. Patient agrees to go over the instructions while at home for a better understanding. Patient also instructed to self quarantine after being tested for COVID-19. The opportunity to ask questions was provided.

## 2021-07-01 ENCOUNTER — Encounter (HOSPITAL_COMMUNITY): Payer: Self-pay

## 2021-07-01 NOTE — Anesthesia Preprocedure Evaluation (Addendum)
Anesthesia Evaluation  Patient identified by MRN, date of birth, ID band Patient awake    Reviewed: Allergy & Precautions, H&P , NPO status , Patient's Chart, lab work & pertinent test results  Airway Mallampati: II  TM Distance: >3 FB Neck ROM: Full    Dental  (+) Poor Dentition, Dental Advisory Given, Loose, Missing,    Pulmonary COPD, former smoker,  Quit smoking 2020, only smoked for 2 years Uses albuterol a few times a month   Pulmonary exam normal breath sounds clear to auscultation       Cardiovascular (-) hypertensionNormal cardiovascular exam+ dysrhythmias (eliquis- stopped 2d ago) Atrial Fibrillation  Rhythm:Regular Rate:Normal  Echo 02/23/21: IMPRESSIONS  1. Left ventricular ejection fraction, by estimation, is 60 to 65%. The  left ventricle has normal function. The left ventricle has no regional  wall motion abnormalities. There is mild left ventricular hypertrophy.  Left ventricular diastolic parameters  were normal.  2. Right ventricular systolic function is normal. The right ventricular  size is normal. There is normal pulmonary artery systolic pressure.  3. The mitral valve is normal in structure. No evidence of mitral valve  regurgitation. No evidence of mitral stenosis.  4. The aortic valve is normal in structure. Aortic valve regurgitation is  not visualized. No aortic stenosis is present.  5. There is mild dilatation of the ascending aorta, measuring 39 mm.  6. The inferior vena cava is normal in size with greater than 50%  respiratory variability, suggesting right atrial pressure of 3 mmHg.    Nuclear stress test 02/04/21: The left ventricular ejection fraction is hyperdynamic (>65%). Nuclear stress EF: 63%. There was no ST segment deviation noted during stress. Defect 1: There is a small defect of mild severity present in the apical lateral location. This is a low risk study. No ischemia or MI  noted Fixed defect represents diaphragmatic attenuation.    S/p EVAR 2021   Neuro/Psych  Neuromuscular disease (R arm peripheral neuropathy) negative psych ROS   GI/Hepatic Neg liver ROS, GERD  Controlled,  Endo/Other  negative endocrine ROS  Renal/GU negative Renal ROS  negative genitourinary   Musculoskeletal  (+) Arthritis , Osteoarthritis,    Abdominal   Peds negative pediatric ROS (+)  Hematology negative hematology ROS (+) hct 44.6, plt 198   Anesthesia Other Findings   Reproductive/Obstetrics negative OB ROS                            Anesthesia Physical Anesthesia Plan  ASA: 2  Anesthesia Plan: General   Post-op Pain Management: Tylenol PO (pre-op)   Induction: Intravenous  PONV Risk Score and Plan: Ondansetron, Dexamethasone and Treatment may vary due to age or medical condition  Airway Management Planned: Oral ETT  Additional Equipment: None  Intra-op Plan:   Post-operative Plan: Extubation in OR  Informed Consent: I have reviewed the patients History and Physical, chart, labs and discussed the procedure including the risks, benefits and alternatives for the proposed anesthesia with the patient or authorized representative who has indicated his/her understanding and acceptance.     Dental advisory given  Plan Discussed with: CRNA  Anesthesia Plan Comments: ( )      Anesthesia Quick Evaluation

## 2021-07-01 NOTE — Progress Notes (Signed)
Anesthesia Chart Review:  Case: [id number] Date/Time: 07/03/21 0815   Procedures:      UMBILICAL HERNIA REPAIR WITH MESH     LEFT INGUINAL HERNIA REPAIR WITH MESH (Left)     EXCISION LIPOMA LEFT BUTTOCK (Left)   Anesthesia type: General   Pre-op diagnosis: UMBILICAL HERNIA, LEFT INGUINAL HERNIA, LIPOMA LEFT BUTTOCK   Location: [facility] OR ROOM 08 / [place] OR   Surgeons: Jovita Kussmaul, MD       DISCUSSION:  Patient is a 75 year old male scheduled for the above procedure. Surgery was initially scheduled for 01/23/21, but postponed until he could undergo cardiac evaluation due to finding of aflutter on 01/15/21 EKG. Follow-up tracings have shown SR/SB, but he was started on Eliquis per cardiologist Dr. Agustin Cree. He also had a stress test and echo in July 2022 (see CV below) and felt "acceptable risk" for surgery.    History includes former smoker, COPD, HTN, paroxsymal aflutter (01/15/21), dyspnea, HLD, GERD, anxiety, thoracoabdominal aortic aneurysm (s/p FEVAR 4 vessel TAMBE 02/19/20, has type 2 endoleak that is being followed, sac measured 5.5 cm 02/19/21; participates in TAMBE study).   Last vascular surgery follow-up with Dr. Sammuel Hines 02/19/21 for one year follow-up. Imaging showed his aneurysm sac 5.5 cm, decreased in size by about 5 mm compared to the previous year. Six month follow-up planned.    Preoperative cardiology input outlined by Almyra Deforest, [place] on 05/19/21, "Patient was contacted 05/19/2021 in reference to pre-operative risk assessment for pending surgery as outlined below.  Adam Jordan was last seen on 03/13/2021 by Dr. Agustin Cree.  Since that day, Adam Jordan has done well without any chest pain or worsening dyspnea on exertion.  Echocardiogram and Myoview obtained in July 2022 were both normal.   Therefore, based on ACC/AHA guidelines, the patient would be at acceptable risk for the planned procedure without further cardiovascular testing.    Per recommendation by our clinical pharmacist,  patient will need to hold Eliquis for 2 days prior to the surgery and restart as soon as possible afterward at the surgeon's discretion..."  He reported instructions to hold Eliquis after 06/30/21 dose.  Anesthesia team to evaluate on the day of surgery.   VS: BP 135/71   Pulse 71   Temp 36.7 C (Oral)   Resp 18   Ht 5\' 9"  (1.753 m)   Wt 79.4 kg   SpO2 97%   BMI 25.86 kg/m    PROVIDERS: [name], DO is PCP [facility]) Jenne Campus, MD is cardiologist. Last visit 03/13/21.  Vinnie Level, MD is vascular surgeon [facility])   LABS: Labs reviewed: Acceptable for surgery. 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 Slightly decreased size of the inferior portion of the excluded aneurysm sac measuring approximately 5.5 x 4.7 cm, previously 5.7 x 5.0 cm. Redemonstrated probable type II endoleak in the anterior aneurysm sac, decreased from prior.  -Redemonstrated fat and bowel containing umbilical and left inguinal hernias, similar to prior without evidence of bowel obstruction  -Stable bilateral subcentimeter pulmonary nodules. Recommend continued attention on follow-up.  XR Abdominal [facility] 02/19/21 [facility]): FINDINGS:  - Sequela of  fenestrated four-vessel aortobiiliac endograft with branch stents in the celiac, SMA and bilateral renal arteries. No evidence of stent migration or kinking.  - Nonobstructive bowel gas pattern. Mild colonic stool burden. Mild scoliosis with degenerative changes of the lumbar spine. Residual contrast noted within the bilateral renal collecting systems.      EKG:   EKG 03/13/21:  Normal sinus rhythm Right bundle branch block  EKG 02/03/21: Sinus bradycardia at 56 bpm Right bundle branch block  EKG 01/15/21: Atrial flutter with variable A-V block Right bundle branch block T wave abnormality, consider anterior ischemia Abnormal ECG Compared to 07/13/2015, atypical atrial flutter new. Confirmed by Adrian Prows [phone]) on 01/15/2021 10:15:44 PM     CV: Echo 02/23/21: IMPRESSIONS   1. Left ventricular ejection fraction, by estimation, is 60 to 65%. The  left ventricle has normal function. The left ventricle has no regional  wall motion abnormalities. There is mild left ventricular hypertrophy.  Left ventricular diastolic parameters  were normal.   2. Right ventricular systolic function is normal. The right ventricular  size is normal. There is normal pulmonary artery systolic pressure.   3. The mitral valve is normal in structure. No evidence of mitral valve  regurgitation. No evidence of mitral stenosis.   4. The aortic valve is normal in structure. Aortic valve regurgitation is  not visualized. No aortic stenosis is present.   5. There is mild dilatation of the ascending aorta, measuring 39 mm.   6. The inferior vena cava is normal in size with greater than 50%  respiratory variability, suggesting right atrial pressure of 3 mmHg.    Nuclear stress test 02/04/21: The left ventricular ejection fraction is hyperdynamic (>65%). Nuclear stress EF: 63%. There was no ST segment deviation noted during stress. Defect 1: There is a small defect of mild severity present in the apical lateral location. This is a low risk study. No ischemia or MI noted Fixed defect represents diaphragmatic attenuation.    PVL Renal Mesenteric Duplex 02/19/21 [facility]): Final Interpretation  - Right: No evidence of renal artery stenosis. Kidney size and               RI are WNL. There is evidence of flow in the right               renal vein.  - Left:    No evidence of  renal artery stenosis. Kidney size and               RI are WNL. There is evidence of flow in the left renal               vein.  - Mesenteric:  Normal celiac artery, SMA, splenic artery and hepatic               artery findings.      Carotid US 01/04/20 [facility]): Final Interpretation  - Right Carotid: Non-hemodynamically significant plaque noted in the CCA. Plaque noted in the ECA. The extracranial vessels were near-normal with only minimal wall thickening or plaque.  - Left Carotid: There is evidence in the ICA of a less than 40% stenosis. Non-hemodynamically significant plaque noted in the CCA. Plaque noted in the ECA.  - Vertebrals:  Both vertebral arteries were patent with antegrade flow.  - Subclavians: Normal flow hemodynamics were seen in bilateral subclavian arteries.    Past Medical History:  Diagnosis Date   Anxiety    Arthritis    osteoarthrititis- knees and most  joints.   COPD (chronic obstructive pulmonary disease) (HCC)    moderate -no regular use of inhalers- rare use of oxygen as sexual activity   Dyspnea    outside in hot weather   GERD (gastroesophageal reflux disease)    Hyperlipidemia    Hypertension    Neuromuscular disorder (HCC)    right arm ? nerve, occ. gets numbness, tingling in 2 middle fingers occasionally.   Paroxysmal atrial flutter ([place]) 01/15/2021   Thoracoabdominal aneurysm    s/p FEVAR 4 Vessel TABME 02/19/20 Dr. Katy Apo    Past Surgical History:  Procedure Laterality Date   ABDOMINAL AORTIC ANEURYSM REPAIR  02/06/2020   EYE SURGERY Bilateral    cataract surgery   FINGER ARTHROPLASTY Left    left thumb-Dr. Amedeo Plenty   IR RADIOLOGIST EVAL & MGMT  10/25/2019   KNEE ARTHROSCOPY Left    KNEE SURGERY Left    Bakers cyst x2   SHOULDER ARTHROSCOPY Right    thumb surgery     TONSILLECTOMY     TOTAL KNEE ARTHROPLASTY Left 03/19/2016   Procedure: LEFT TOTAL KNEE ARTHROPLASTY;  Surgeon: Sydnee Cabal, MD;  Location: [facility] ORS;  Service: Orthopedics;   Laterality: Left;   TOTAL SHOULDER REPLACEMENT Right    VASCULAR SURGERY     VASECTOMY      MEDICATIONS:  acetaminophen (TYLENOL) 500 MG tablet   apixaban (ELIQUIS) 5 MG TABS tablet   atorvastatin (LIPITOR) 20 MG tablet   clonazePAM (KLONOPIN) 1 MG tablet   fluticasone (FLONASE) 50 MCG/ACT nasal spray   meloxicam (MOBIC) 15 MG tablet   Multiple Vitamin (MULTIVITAMIN WITH MINERALS) TABS tablet   Naphazoline HCl (CLEAR EYES OP)   OVER THE COUNTER MEDICATION   PROAIR HFA 108 (90 BASE) MCG/ACT inhaler   sildenafil (REVATIO) 20 MG tablet   triamcinolone cream (KENALOG) 0.1 %   No current facility-administered medications for this encounter.    Myra Gianotti, PA-C Surgical Short Stay/Anesthesiology [facility] Phone [phone] [facility] Phone [phone] 07/01/2021 3:39 PM

## 2021-07-03 ENCOUNTER — Ambulatory Visit (HOSPITAL_COMMUNITY)
Admission: RE | Admit: 2021-07-03 | Discharge: 2021-07-03 | Disposition: A | Discharge status: Home / Self Care | Payer: Medicare HMO | Attending: General Surgery | Admitting: General Surgery

## 2021-07-03 ENCOUNTER — Ambulatory Visit: Payer: Self-pay | Admitting: Otolaryngology

## 2021-07-03 ENCOUNTER — Ambulatory Visit (HOSPITAL_COMMUNITY): Payer: Medicare HMO | Admitting: Certified Registered Nurse Anesthetist

## 2021-07-03 ENCOUNTER — Encounter (HOSPITAL_COMMUNITY): Payer: Self-pay | Admitting: General Surgery

## 2021-07-03 ENCOUNTER — Ambulatory Visit (HOSPITAL_COMMUNITY): Payer: Medicare HMO | Admitting: Vascular Surgery

## 2021-07-03 ENCOUNTER — Encounter (HOSPITAL_COMMUNITY)
Admission: RE | Disposition: A | Discharge status: Home / Self Care | Payer: Self-pay | Source: Home / Self Care | Attending: General Surgery

## 2021-07-03 ENCOUNTER — Other Ambulatory Visit: Payer: Self-pay

## 2021-07-03 DIAGNOSIS — K429 Umbilical hernia without obstruction or gangrene: Secondary | ICD-10-CM | POA: Insufficient documentation

## 2021-07-03 DIAGNOSIS — I4891 Unspecified atrial fibrillation: Secondary | ICD-10-CM | POA: Insufficient documentation

## 2021-07-03 DIAGNOSIS — Z87891 Personal history of nicotine dependence: Secondary | ICD-10-CM | POA: Diagnosis not present

## 2021-07-03 DIAGNOSIS — M1990 Unspecified osteoarthritis, unspecified site: Secondary | ICD-10-CM | POA: Insufficient documentation

## 2021-07-03 DIAGNOSIS — J449 Chronic obstructive pulmonary disease, unspecified: Secondary | ICD-10-CM | POA: Insufficient documentation

## 2021-07-03 DIAGNOSIS — D171 Benign lipomatous neoplasm of skin and subcutaneous tissue of trunk: Secondary | ICD-10-CM | POA: Diagnosis not present

## 2021-07-03 DIAGNOSIS — L723 Sebaceous cyst: Secondary | ICD-10-CM | POA: Diagnosis not present

## 2021-07-03 DIAGNOSIS — G629 Polyneuropathy, unspecified: Secondary | ICD-10-CM | POA: Insufficient documentation

## 2021-07-03 DIAGNOSIS — K219 Gastro-esophageal reflux disease without esophagitis: Secondary | ICD-10-CM | POA: Insufficient documentation

## 2021-07-03 DIAGNOSIS — K4090 Unilateral inguinal hernia, without obstruction or gangrene, not specified as recurrent: Secondary | ICD-10-CM | POA: Diagnosis not present

## 2021-07-03 DIAGNOSIS — L720 Epidermal cyst: Secondary | ICD-10-CM | POA: Diagnosis not present

## 2021-07-03 HISTORY — PX: UMBILICAL HERNIA REPAIR: SHX196

## 2021-07-03 HISTORY — PX: INSERTION OF MESH: SHX5868

## 2021-07-03 HISTORY — PX: CYST REMOVAL LEG: SHX6280

## 2021-07-03 HISTORY — PX: INGUINAL HERNIA REPAIR: SHX194

## 2021-07-03 SURGERY — REPAIR, HERNIA, UMBILICAL, ADULT
Anesthesia: General | Site: Inguinal

## 2021-07-03 MED ORDER — LACTATED RINGERS IV SOLN: INTRAVENOUS | Status: DC

## 2021-07-03 MED ORDER — CEFAZOLIN SODIUM-DEXTROSE 2-4 GM/100ML-% IV SOLN
INTRAVENOUS | Status: AC
  Filled 2021-07-03: qty 100

## 2021-07-03 MED ORDER — ACETAMINOPHEN 500 MG PO TABS
ORAL_TABLET | ORAL | Status: AC
  Administered 2021-07-03: 1000 mg via ORAL
  Filled 2021-07-03: qty 2

## 2021-07-03 MED ORDER — BUPIVACAINE-EPINEPHRINE (PF) 0.25% -1:200000 IJ SOLN
INTRAMUSCULAR | Status: AC
  Filled 2021-07-03: qty 30

## 2021-07-03 MED ORDER — CHLORHEXIDINE GLUCONATE CLOTH 2 % EX PADS: 6.0000 | MEDICATED_PAD | Freq: Once | CUTANEOUS | Status: DC

## 2021-07-03 MED ORDER — HYDROCODONE-ACETAMINOPHEN 5-325 MG PO TABS: 1.0000 | ORAL_TABLET | Freq: Four times a day (QID) | ORAL | 0 refills | Status: DC | PRN

## 2021-07-03 MED ORDER — CELECOXIB 200 MG PO CAPS
200.0000 mg | ORAL_CAPSULE | ORAL | Status: AC
  Administered 2021-07-03: 200 mg via ORAL

## 2021-07-03 MED ORDER — ORAL CARE MOUTH RINSE: 15.0000 mL | Freq: Once | OROMUCOSAL | Status: AC

## 2021-07-03 MED ORDER — GABAPENTIN 300 MG PO CAPS: 300.0000 mg | ORAL_CAPSULE | ORAL | Status: AC

## 2021-07-03 MED ORDER — PROPOFOL 10 MG/ML IV BOLUS
INTRAVENOUS | Status: AC
  Filled 2021-07-03: qty 20

## 2021-07-03 MED ORDER — BUPIVACAINE-EPINEPHRINE 0.25% -1:200000 IJ SOLN
INTRAMUSCULAR | Status: DC | PRN
  Administered 2021-07-03: 30 mL

## 2021-07-03 MED ORDER — PROPOFOL 10 MG/ML IV BOLUS
INTRAVENOUS | Status: DC | PRN
  Administered 2021-07-03: 100 mg via INTRAVENOUS

## 2021-07-03 MED ORDER — PHENYLEPHRINE 40 MCG/ML (10ML) SYRINGE FOR IV PUSH (FOR BLOOD PRESSURE SUPPORT)
PREFILLED_SYRINGE | INTRAVENOUS | Status: AC
  Filled 2021-07-03: qty 10

## 2021-07-03 MED ORDER — PHENYLEPHRINE 40 MCG/ML (10ML) SYRINGE FOR IV PUSH (FOR BLOOD PRESSURE SUPPORT)
PREFILLED_SYRINGE | INTRAVENOUS | Status: DC | PRN
  Administered 2021-07-03 (×2): 80 ug via INTRAVENOUS

## 2021-07-03 MED ORDER — MIDAZOLAM HCL 2 MG/2ML IJ SOLN
INTRAMUSCULAR | Status: AC
  Filled 2021-07-03: qty 2

## 2021-07-03 MED ORDER — SUGAMMADEX SODIUM 200 MG/2ML IV SOLN
INTRAVENOUS | Status: DC | PRN
  Administered 2021-07-03: 200 mg via INTRAVENOUS

## 2021-07-03 MED ORDER — EPHEDRINE SULFATE 50 MG/ML IJ SOLN
INTRAMUSCULAR | Status: DC | PRN
  Administered 2021-07-03: 5 mg via INTRAVENOUS

## 2021-07-03 MED ORDER — LIDOCAINE 2% (20 MG/ML) 5 ML SYRINGE
INTRAMUSCULAR | Status: DC | PRN
  Administered 2021-07-03: 60 mg via INTRAVENOUS

## 2021-07-03 MED ORDER — FENTANYL CITRATE (PF) 250 MCG/5ML IJ SOLN
INTRAMUSCULAR | Status: AC
  Filled 2021-07-03: qty 5

## 2021-07-03 MED ORDER — EPHEDRINE 5 MG/ML INJ
INTRAVENOUS | Status: AC
  Filled 2021-07-03: qty 5

## 2021-07-03 MED ORDER — DEXAMETHASONE SODIUM PHOSPHATE 10 MG/ML IJ SOLN
INTRAMUSCULAR | Status: DC | PRN
  Administered 2021-07-03: 10 mg via INTRAVENOUS

## 2021-07-03 MED ORDER — CEFAZOLIN SODIUM-DEXTROSE 2-4 GM/100ML-% IV SOLN: 2.0000 g | INTRAVENOUS | Status: DC

## 2021-07-03 MED ORDER — ACETAMINOPHEN 500 MG PO TABS: 1000.0000 mg | ORAL_TABLET | ORAL | Status: AC

## 2021-07-03 MED ORDER — GABAPENTIN 300 MG PO CAPS
ORAL_CAPSULE | ORAL | Status: AC
  Administered 2021-07-03: 300 mg via ORAL
  Filled 2021-07-03: qty 1

## 2021-07-03 MED ORDER — FENTANYL CITRATE (PF) 250 MCG/5ML IJ SOLN
INTRAMUSCULAR | Status: DC | PRN
  Administered 2021-07-03: 50 ug via INTRAVENOUS
  Administered 2021-07-03: 25 ug via INTRAVENOUS
  Administered 2021-07-03: 50 ug via INTRAVENOUS

## 2021-07-03 MED ORDER — 0.9 % SODIUM CHLORIDE (POUR BTL) OPTIME
TOPICAL | Status: DC | PRN
  Administered 2021-07-03: 1000 mL

## 2021-07-03 MED ORDER — CHLORHEXIDINE GLUCONATE 0.12 % MT SOLN
15.0000 mL | Freq: Once | OROMUCOSAL | Status: AC
  Administered 2021-07-03: 15 mL via OROMUCOSAL
  Filled 2021-07-03: qty 15

## 2021-07-03 MED ORDER — ONDANSETRON HCL 4 MG/2ML IJ SOLN
INTRAMUSCULAR | Status: DC | PRN
  Administered 2021-07-03: 4 mg via INTRAVENOUS

## 2021-07-03 MED ORDER — CELECOXIB 200 MG PO CAPS
ORAL_CAPSULE | ORAL | Status: AC
  Filled 2021-07-03: qty 1

## 2021-07-03 MED ORDER — MIDAZOLAM HCL 2 MG/2ML IJ SOLN
INTRAMUSCULAR | Status: DC | PRN
  Administered 2021-07-03: 2 mg via INTRAVENOUS

## 2021-07-03 MED ORDER — PHENYLEPHRINE HCL-NACL 20-0.9 MG/250ML-% IV SOLN
INTRAVENOUS | Status: DC | PRN
  Administered 2021-07-03: 50 ug/min via INTRAVENOUS

## 2021-07-03 MED ORDER — ROCURONIUM BROMIDE 10 MG/ML (PF) SYRINGE
PREFILLED_SYRINGE | INTRAVENOUS | Status: DC | PRN
  Administered 2021-07-03: 100 mg via INTRAVENOUS
  Administered 2021-07-03: 30 mg via INTRAVENOUS

## 2021-07-03 MED ORDER — CEFAZOLIN SODIUM-DEXTROSE 2-3 GM-%(50ML) IV SOLR
INTRAVENOUS | Status: DC | PRN
  Administered 2021-07-03: 2 g via INTRAVENOUS

## 2021-07-03 SURGICAL SUPPLY — 45 items
BAG COUNTER SPONGE SURGICOUNT (BAG) ×5 IMPLANT
BLADE CLIPPER SURG (BLADE) IMPLANT
CANISTER SUCT 3000ML PPV (MISCELLANEOUS) IMPLANT
CHLORAPREP W/TINT 26 (MISCELLANEOUS) ×5 IMPLANT
COVER SURGICAL LIGHT HANDLE (MISCELLANEOUS) ×5 IMPLANT
DECANTER SPIKE VIAL GLASS SM (MISCELLANEOUS) ×5 IMPLANT
DERMABOND ADVANCED (GAUZE/BANDAGES/DRESSINGS) ×2
DERMABOND ADVANCED .7 DNX12 (GAUZE/BANDAGES/DRESSINGS) IMPLANT
DRAIN PENROSE 1/2X12 LTX STRL (WOUND CARE) ×1 IMPLANT
DRAPE LAPAROSCOPIC ABDOMINAL (DRAPES) ×5 IMPLANT
DRAPE LAPAROTOMY 100X72 PEDS (DRAPES) ×5 IMPLANT
ELECT REM PT RETURN 9FT ADLT (ELECTROSURGICAL) ×5
ELECTRODE REM PT RTRN 9FT ADLT (ELECTROSURGICAL) ×4 IMPLANT
GAUZE SPONGE 4X4 12PLY STRL (GAUZE/BANDAGES/DRESSINGS) IMPLANT
GLOVE SURG ENC MOIS LTX SZ7.5 (GLOVE) ×5 IMPLANT
GOWN STRL REUS W/ TWL LRG LVL3 (GOWN DISPOSABLE) ×8 IMPLANT
GOWN STRL REUS W/TWL LRG LVL3 (GOWN DISPOSABLE) ×10
KIT BASIN OR (CUSTOM PROCEDURE TRAY) ×5 IMPLANT
KIT TURNOVER KIT B (KITS) ×5 IMPLANT
MESH ULTRAPRO 3X6 7.6X15CM (Mesh General) ×1 IMPLANT
MESH VENTRALEX ST 1-7/10 CRC S (Mesh General) ×1 IMPLANT
NDL HYPO 25GX1X1/2 BEV (NEEDLE) ×4 IMPLANT
NEEDLE HYPO 25GX1X1/2 BEV (NEEDLE) ×10 IMPLANT
NS IRRIG 1000ML POUR BTL (IV SOLUTION) ×5 IMPLANT
PACK GENERAL/GYN (CUSTOM PROCEDURE TRAY) ×5 IMPLANT
PAD ARMBOARD 7.5X6 YLW CONV (MISCELLANEOUS) ×10 IMPLANT
PENCIL SMOKE EVACUATOR (MISCELLANEOUS) ×5 IMPLANT
SPECIMEN JAR SMALL (MISCELLANEOUS) ×5 IMPLANT
SUT MNCRL AB 4-0 PS2 18 (SUTURE) ×7 IMPLANT
SUT NOVA NAB DX-16 0-1 5-0 T12 (SUTURE) ×5 IMPLANT
SUT PROLENE 2 0 SH DA (SUTURE) ×10 IMPLANT
SUT SILK 2 0 SH (SUTURE) IMPLANT
SUT SILK 3 0 (SUTURE) ×5
SUT SILK 3-0 18XBRD TIE 12 (SUTURE) ×4 IMPLANT
SUT VIC AB 0 CT1 27 (SUTURE) ×5
SUT VIC AB 0 CT1 27XBRD ANBCTR (SUTURE) ×4 IMPLANT
SUT VIC AB 2-0 SH 27 (SUTURE) ×5
SUT VIC AB 2-0 SH 27X BRD (SUTURE) ×4 IMPLANT
SUT VIC AB 3-0 SH 27 (SUTURE) ×10
SUT VIC AB 3-0 SH 27X BRD (SUTURE) IMPLANT
SUT VIC AB 3-0 SH 27XBRD (SUTURE) ×4 IMPLANT
SUT VIC AB CT1 27XBRD ANBCTRL (SUTURE) ×4
SYR CONTROL 10ML LL (SYRINGE) ×5 IMPLANT
TOWEL GREEN STERILE (TOWEL DISPOSABLE) ×5 IMPLANT
TOWEL GREEN STERILE FF (TOWEL DISPOSABLE) ×5 IMPLANT

## 2021-07-03 NOTE — Anesthesia Postprocedure Evaluation (Signed)
Anesthesia Post Note  Patient: Adam Jordan  Procedure(s) Performed: UMBILICAL HERNIA REPAIR WITH MESH (Abdomen) LEFT INGUINAL HERNIA REPAIR WITH MESH (Left: Inguinal) EXCISION CYST LEFT BUTTOCK (Left: Buttocks) INSERTION OF MESH X2 (Abdomen)     Patient location during evaluation: PACU Anesthesia Type: General Level of consciousness: awake and alert, oriented and patient cooperative Pain management: pain level controlled Vital Signs Assessment: post-procedure vital signs reviewed and stable Respiratory status: spontaneous breathing, nonlabored ventilation and respiratory function stable Cardiovascular status: blood pressure returned to baseline and stable Postop Assessment: no apparent nausea or vomiting Anesthetic complications: no   No notable events documented.  Last Vitals:  Vitals:   07/03/21 1220 07/03/21 1234  BP: 105/65 113/64  Pulse: 64 (!) 58  Resp: 20 15  Temp:  36.4 C  SpO2: 94% 95%    Last Pain:  Vitals:   07/03/21 1234  TempSrc:   PainSc: 0-No pain                 Pervis Hocking

## 2021-07-03 NOTE — Interval H&P Note (Signed)
History and Physical Interval Note:  07/03/2021 8:14 AM  Adam Jordan  has presented today for surgery, with the diagnosis of UMBILICAL HERNIA, LEFT INGUINAL HERNIA, LIPOMA LEFT BUTTOCK.  The various methods of treatment have been discussed with the patient and family. After consideration of risks, benefits and other options for treatment, the patient has consented to  Procedure(s): UMBILICAL HERNIA REPAIR WITH MESH (N/A) LEFT INGUINAL HERNIA REPAIR WITH MESH (Left) EXCISION LIPOMA LEFT BUTTOCK (Left) as a surgical intervention.  The patient's history has been reviewed, patient examined, no change in status, stable for surgery.  I have reviewed the patient's chart and labs.  Questions were answered to the patient's satisfaction.     [name]

## 2021-07-03 NOTE — Transfer of Care (Signed)
Immediate Anesthesia Transfer of Care Note  Patient: Adam Jordan  Procedure(s) Performed: UMBILICAL HERNIA REPAIR WITH MESH (Abdomen) LEFT INGUINAL HERNIA REPAIR WITH MESH (Left: Inguinal) EXCISION CYST LEFT BUTTOCK (Left: Buttocks) INSERTION OF MESH X2 (Abdomen)  Patient Location: PACU  Anesthesia Type:General  Level of Consciousness: awake, alert  and oriented  Airway & Oxygen Therapy: Patient connected to face mask oxygen  Post-op Assessment: Post -op Vital signs reviewed and stable  Post vital signs: stable  Last Vitals:  Vitals Value Taken Time  BP    Temp    Pulse 63 07/03/21 1148  Resp 20 07/03/21 1148  SpO2 98 % 07/03/21 1148  Vitals shown include unvalidated device data.  Last Pain:  Vitals:   07/03/21 0804  TempSrc:   PainSc: 3       Patients Stated Pain Goal: 1 (67/51/98 2429)  Complications: No notable events documented.

## 2021-07-03 NOTE — Op Note (Addendum)
07/03/2021  11:36 AM  PATIENT:  Adam Jordan  75 y.o. male  PRE-OPERATIVE DIAGNOSIS:  UMBILICAL HERNIA, LEFT INGUINAL HERNIA, LIPOMA LEFT BUTTOCK  POST-OPERATIVE DIAGNOSIS:  UMBILICAL HERNIA, LEFT INGUINAL HERNIA, CYST LEFT BUTTOCK  PROCEDURE:  Procedure(s): UMBILICAL HERNIA REPAIR WITH MESH (N/A) LEFT INGUINAL HERNIA REPAIR WITH MESH (Left) EXCISION CYST LEFT BUTTOCK (Left) INSERTION OF MESH X2 (N/A)  SURGEON:  Surgeon(s) and Role:    * Jovita Kussmaul, MD - Primary  PHYSICIAN ASSISTANT:   ASSISTANTS: Dr.    ANESTHESIA:   local and general  EBL:  minimal   BLOOD ADMINISTERED:none  DRAINS: none   LOCAL MEDICATIONS USED:  MARCAINE     SPECIMEN:  Source of Specimen:  cyst left buttock  DISPOSITION OF SPECIMEN:  PATHOLOGY  COUNTS:  YES  TOURNIQUET:  * No tourniquets in log *  DICTATION: .Dragon Dictation  After informed consent was obtained the patient was brought to the operating room and placed in the supine position on the operating table.  After adequate induction of general anesthesia the patient was moved into a left side up lateral position on the operating room table and all pressure points were padded.  The left buttock area was prepped with ChloraPrep, allowed to dry, and draped in usual sterile manner.  An appropriate timeout was performed.  An elliptical incision was made transversely in the skin overlying the palpable mass.  The incision was carried through the skin and subcutaneous tissue sharply with the electrocautery.  Dissection was then carried around the mass and through the subcutaneous tissue until the entire mass was removed.  The mass measured at least 5cm in diameter. It became apparent that the mass was not a lipoma but was a sebaceous cyst.  Once the entire mass was removed it was sent to pathology for further evaluation.  Hemostasis was achieved using the Bovie electrocautery.  The wound was irrigated with saline and infiltrated with quarter percent  Marcaine.  The deep layer of the wound was closed with interrupted 3-0 Vicryl stitches.  The skin was closed with a running 4-0 Monocryl subcuticular stitch.  Dermabond dressings were applied.  The patient tolerated this portion of the procedure well.  All needle sponge and instrument counts were correct.  The drapes were removed and the patient was moved back into a supine position on the operating room table.  Next the abdomen and left groin were again prepped with ChloraPrep, allowed to dry, draped in usual sterile manner.  An appropriate timeout was performed.  Attention was first turned to the umbilicus.  There was a small reducible hernia at this location.  The area around the umbilicus was infiltrated with quarter percent Marcaine.  A small transversely oriented incision was made at the upper edge of the umbilicus with a 15 blade knife.  The incision was carried through the skin and subcutaneous tissue sharply with the electrocautery.  The hernia sac was identified.  It was opened and there were some preperitoneal fat within the hernia that was able to be separated from the wall of the sac and reduced.  The fascial edges appeared healthy.  A small piece of umbilical hernia repair system mesh was chosen.  The mesh was placed through the fascial defect and while pulling on the anchors the mesh was observed to be in good apposition to the anterior abdominal wall and oriented with the coated side down.  We did not enter the abdominal cavity itself but created the space in the  preperitoneal space.  The anchors were then trimmed.  The fascial defect was then closed with interrupted #1 Novafil stitches incorporating the remaining anchor of the mesh.  Once this was accomplished then the mesh was in good position and held in place by the anchor and the fascia was repaired nicely.  The wound was irrigated with copious amounts of saline.  The umbilicus was tacked back to the fascia with a 3-0 Vicryl stitch.  The  subcutaneous tissue was closed with interrupted 3-0 Vicryl stitches.  The skin was then closed with a running 4-0 Monocryl subcuticular stitch.  Dermabond dressings were applied.  Attention was then turned to the left groin.  The left groin was infiltrated with quarter percent Marcaine.  A small incision was made from the edge of the pubic tubercle towards the anterior superior iliac spine.  The incision was carried through the skin and subcutaneous tissue sharply with the electrocautery until the fascia of the external oblique was encountered.  A small bridging vein was clamped with hemostats, divided, and ligated with 3-0 silk ties.  The external bleak fascia was then opened along its fibers towards the apex of the external ring with a 15 blade knife and Metzenbaum scissors.  A [place] retractor was deployed.  Blunt dissection was carried out of the cord structures until they could be surrounded between 2 fingers.  Half inch Penrose drain was placed around the cord structures for retraction purposes.  The patient had a large broad-based defect medially in the canal consistent with a direct hernia.  There was no obvious hernia associated with the cord.  The direct hernia was then reduced and the floor of the canal was repaired with interrupted 0 Vicryl stitches.  Next a 3 x 6 piece of ultra Pro mesh was chosen and cut to the appropriate size.  The mesh was sewed inferiorly to the shelving edge of the inguinal ligament with a running 2-0 Prolene stitch.  The tails of the mesh were wrapped around the cord structures.  Superiorly the mesh was sewed to the musculoaponeurotic strength 5 the transversalis with interrupted 2-0 Prolene vertical mattress stitches.  Lateral to the cord the tails of the mesh were anchored to the shelving edge of the inguinal ligament with interrupted 2-0 Prolene stitches.  Once this was accomplished the mesh appeared to be in good position the hernia seem well repaired.  The wound was  irrigated with copious amounts of saline.  The ilioinguinal nerve was looked for but not seen during the dissection.  Next the fascia of the external oblique was reapproximated with a running 2-0 Vicryl stitch.  The wound was infiltrated with more quarter percent Marcaine.  The subcutaneous fascia was closed with a running 3-0 Vicryl stitch.  The skin was closed with a running 4-0 Monocryl subcuticular stitch.  Dermabond dressings were applied.  The patient tolerated the procedure well.  At the end of the case all needle sponge and instrument counts were correct.  The patient was then awakened and taken recovery in stable condition.  PLAN OF CARE: Discharge to home after PACU  PATIENT DISPOSITION:  PACU - hemodynamically stable.   Delay start of Pharmacological VTE agent (>24hrs) due to surgical blood loss or risk of bleeding: not applicable

## 2021-07-03 NOTE — H&P (Signed)
Adam Jordan  Location: [facility] Surgery Patient #: [id number] DOB: 02-Mar-1946 Married / Language: English / Race: White Male   History of Present Illness  The patient is a 75 year old male who presents for a follow-up for Abdominal swelling. The patient is a 75 year old white male who we saw last year with an umbilical hernia.  At that time he was also found to have a AAA.  He ended up going to [facility] to have the AAA repaired intraluminally with stents.  He tolerated this well.  He is now ready to have his umbilical hernia fixed.  He still has some discomfort associated with the bellybutton hernia.  Since his last visit he is also developed some swelling in the left groin as well as a lump in the left buttock.   Allergies  Losartan Potassium *ANTIHYPERTENSIVES*   Anaphylaxis. Peanuts   Anaphylaxis. Peanut containing drug products AMLODIPINE MALEATE   Allergies Reconciled    Medication History  Proventil  ((2.5 MG/3ML)0.083% Nebulized Soln, Inhalation) Active. Aspirin  (81MG  Tablet, Oral) Active. clonazePAM  (1MG  Tablet, Oral) Active. Meloxicam  (15MG  Tablet, Oral) Active. Multivitamins/Minerals  (Oral) Active. Afrin  (0.05% Solution, Nasal) Active. Atorvastatin Calcium  (20MG  Tablet, Oral) Active. Azithromycin  (250MG  Tablet, Oral) Active. predniSONE  (10MG  Tablet, Oral) Active. Sildenafil Citrate  (20MG  Tablet, Oral) Active. Medications Reconciled     Review of Systems  General Not Present- Appetite Loss, Chills, Fatigue, Fever, Night Sweats, Weight Gain and Weight Loss. Skin Not Present- Change in Wart/Mole, Dryness, Hives, Jaundice, New Lesions, Non-Healing Wounds, Rash and Ulcer. HEENT Present- Hearing Loss, Ringing in the Ears, Seasonal Allergies, Sinus Pain and Wears glasses/contact lenses. Not Present- Earache, Hoarseness, Nose Bleed, Oral Ulcers, Sore Throat, Visual Disturbances and Yellow Eyes. Respiratory Not Present- Bloody sputum, Chronic Cough,  Difficulty Breathing, Snoring and Wheezing. Breast Not Present- Breast Mass, Breast Pain, Nipple Discharge and Skin Changes. Cardiovascular Present- Leg Cramps. Not Present- Chest Pain, Difficulty Breathing Lying Down, Palpitations, Rapid Heart Rate, Shortness of Breath and Swelling of Extremities. Gastrointestinal Present- Abdominal Pain and Gets full quickly at meals. Not Present- Bloating, Bloody Stool, Change in Bowel Habits, Chronic diarrhea, Constipation, Difficulty Swallowing, Excessive gas, Hemorrhoids, Indigestion, Nausea, Rectal Pain and Vomiting. Male Genitourinary Present- Urgency. Not Present- Blood in Urine, Change in Urinary Stream, Frequency, Impotence, Nocturia, Painful Urination and Urine Leakage. Musculoskeletal Present- Back Pain, Joint Pain, Joint Stiffness, Muscle Pain, Muscle Weakness and Swelling of Extremities. Neurological Not Present- Decreased Memory, Fainting, Headaches, Numbness, Seizures, Tingling, Tremor, Trouble walking and Weakness. Psychiatric Not Present- Anxiety, Bipolar, Change in Sleep Pattern, Depression, Fearful and Frequent crying. Endocrine Not Present- Cold Intolerance, Excessive Hunger, Hair Changes, Heat Intolerance and New Diabetes. Hematology Not Present- Blood Thinners, Easy Bruising, Excessive bleeding, Gland problems, HIV and Persistent Infections.  Vitals Weight: 173.2 lb   Height: 70 in  Body Surface Area: 1.96 m   Body Mass Index: 24.85 kg/m   Temp.: 98.1 F    Pulse: 78 (Regular)          Physical Exam  General Mental Status - Alert. General Appearance - Consistent with stated age. Hydration - Well hydrated. Voice - Normal.  Integumentary Note:  There is a 4 cm fatty feeling mass that seems to be isolated to the subcutaneous tissue of the left buttock. There are no overlying skin changes.   Head and Neck Head - normocephalic, atraumatic with no lesions or palpable masses. Trachea - midline. Thyroid Gland Characteristics -  normal size  and consistency.  Eye Eyeball - Bilateral - Extraocular movements intact. Sclera/Conjunctiva - Bilateral - No scleral icterus.  Chest and Lung Exam Chest and lung exam reveals  - quiet, even and easy respiratory effort with no use of accessory muscles and on auscultation, normal breath sounds, no adventitious sounds and normal vocal resonance. Inspection Chest Wall - Normal. Back - normal.  Cardiovascular Cardiovascular examination reveals  - normal heart sounds, regular rate and rhythm with no murmurs and normal pedal pulses bilaterally.  Abdomen Note:  The abdomen is soft and nontender. There is a small reducible bulge at the umbilicus   Male Genitourinary Note:  There is also a reducible bulge in the left groin. There is no bulge or impulse with straining in the right groin   Neurologic Neurologic evaluation reveals  - alert and oriented x 3 with no impairment of recent or remote memory. Mental Status - Normal.  Musculoskeletal Normal Exam - Left - Upper Extremity Strength Normal and Lower Extremity Strength Normal. Normal Exam - Right - Upper Extremity Strength Normal and Lower Extremity Strength Normal.  Lymphatic Head & Neck  General Head & Neck Lymphatics: Bilateral - Description - Normal. Axillary  General Axillary Region: Bilateral - Description - Normal. Tenderness - Non Tender. Femoral & Inguinal  Generalized Femoral & Inguinal Lymphatics: Bilateral - Description - Normal. Tenderness - Non Tender.    Assessment & Plan  INGUINAL HERNIA OF LEFT SIDE WITHOUT OBSTRUCTION OR GANGRENE (K40.90) LIPOMA OF BUTTOCK (R67.8) UMBILICAL HERNIA WITHOUT OBSTRUCTION OR GANGRENE (K42.9) Impression: The patient was seen last year with an umbilical hernia. He was also found to have a AAA. Since his last visit the AAA was fixed with endoluminal stents in [facility]. He is now ready to have his umbilical hernia fixed. Since his last visit he has also developed a left  inguinal hernia as well as a lipoma on the left buttock. He would like to have all of these fixed at the same time. I have discussed with him in detail the risks and benefits of the operations as well as some of the technical aspects including the use of mesh and risk of chronic pain and he understands and wishes to proceed. We will plan for an umbilical hernia repair with mesh, left inguinal hernia repair with mesh, and excision of lipoma from the left buttock. This patient encounter took 30 minutes today to perform the following: take history, perform exam, review outside records, interpret imaging, counsel the patient on their diagnosis and document encounter, findings & plan in the EHR

## 2021-07-03 NOTE — Anesthesia Procedure Notes (Signed)
Procedure Name: Intubation Date/Time: 07/03/2021 9:10 AM Performed by: Lavell Luster, CRNA Pre-anesthesia Checklist: Patient identified, Emergency Drugs available, Suction available, Patient being monitored and Timeout performed Patient Re-evaluated:Patient Re-evaluated prior to induction Oxygen Delivery Method: Circle system utilized Preoxygenation: Pre-oxygenation with 100% oxygen Induction Type: IV induction Ventilation: Mask ventilation without difficulty Laryngoscope Size: Mac and 4 Grade View: Grade III Tube type: Oral Tube size: 7.5 mm Number of attempts: 1 Airway Equipment and Method: Stylet Placement Confirmation: ETT inserted through vocal cords under direct vision, positive ETCO2 and breath sounds checked- equal and bilateral Secured at: 22 cm Tube secured with: Tape Dental Injury: Teeth and Oropharynx as per pre-operative assessment

## 2021-07-06 ENCOUNTER — Encounter (HOSPITAL_COMMUNITY): Payer: Self-pay | Admitting: General Surgery

## 2021-07-06 LAB — SURGICAL PATHOLOGY

## 2021-07-21 DIAGNOSIS — K4090 Unilateral inguinal hernia, without obstruction or gangrene, not specified as recurrent: Secondary | ICD-10-CM

## 2021-07-21 HISTORY — DX: Unilateral inguinal hernia, without obstruction or gangrene, not specified as recurrent: K40.90

## 2021-09-22 ENCOUNTER — Encounter: Payer: Self-pay | Admitting: Emergency Medicine

## 2021-09-22 ENCOUNTER — Encounter: Payer: Medicare HMO | Admitting: *Deleted

## 2021-09-22 ENCOUNTER — Ambulatory Visit (INDEPENDENT_AMBULATORY_CARE_PROVIDER_SITE_OTHER): Payer: Medicare HMO | Admitting: Emergency Medicine

## 2021-09-22 ENCOUNTER — Other Ambulatory Visit: Payer: Self-pay

## 2021-09-22 DIAGNOSIS — Z006 Encounter for examination for normal comparison and control in clinical research program: Secondary | ICD-10-CM

## 2021-09-22 DIAGNOSIS — R918 Other nonspecific abnormal finding of lung field: Secondary | ICD-10-CM | POA: Insufficient documentation

## 2021-09-22 NOTE — Research (Signed)
Title: [place]: CliNIcal Utility of ManaGement of Patients witH CT and LDCT Identified Pulmonary Nodules UsinG the Percepta NasAL Swab ClassifiEr -- with Familiarization   Protocol #: [id number] Sponsor: [employer]   Protocol Revision 1 dated 01Sep2022 and confirmed current on today's visit, IRB approved Revision 1 on 29Dec2022.   Objectives:  Primary: To evaluate if use of the Percepta Nasal Swab test in the diagnostic work up of newly identified pulmonary nodules reduces the number of invasive procedures in the group classified as low-risk by the test and that are benign as compared to a control group managed without a Percepta Nasal Swab test result.                   A newly identified nodule is defined as any nodule first identified on imaging                   <90 days prior to nasal sample collection that hasnt undergone a diagnostic                    procedure for the management of their index nodule prior to enrollment.                   CT imaging includes conventional CT, LDCT, HRCT                   Benign diagnosis is defined as a specific diagnosis of a benign condition,                    radiographic resolution or stability at ? 24 months, or no cytological,                    radiological, or pathological evidence of cancer.                   Procedures will be categorized as either invasive or non-invasive in the Data                    Management Plan (DMP). Secondary: To evaluate if use of the Percepta Nasal Swab test in the diagnostic work up of newly identified pulmonary nodules increases the proportion of subjects classified as high-risk by the test and have primary lung cancer that go directly to appropriate therapy as compared to a control group managed without a Percepta Nasal Swab test result.                    Proportion of subjects that go directly to appropriate therapy is defined as those                     subjects that undergo surgery, ablative  or other appropriate therapy as the next                     step after the Percepta Nasal Swab test result without intervening non-surgical                     procedures                             a. Non-surgical procedures include diagnostic PET, but not PET for  staging purposes.                             b. Appropriate therapies will be defined in the CRF.                    A newly identified nodule is defined as any nodule first identified on imaging                    <90 days prior to nasal sample collection.                    Lung cancer diagnosis is defined as established by cytology or pathology, or in                     circumstances where a presumptive diagnosis of cancer led to definitive                     ablative or other appropriate therapy without pathology. Key Inclusion Criteria:  Inclusion Criteria:  Able to tolerate nasal epithelial specimen collection  Signed written Informed Consent obtained  Subject clinical history available for review by sponsor and regulatory agencies  New nodule first identified on imaging < 90 days prior to nasal sample collection (index nodule)  CT report available for index nodule  75 - 33 years of age  Current or former smoker (>100 cigarettes in a lifetime)  Pulmonary nodule ?30 mm detected by CT  Key Exclusion Criteria: Exclusion Criteria  Subject has undergone a diagnostic procedure for the management of their index nodule after the index CT and prior to enrollment  Active cancer (other than non-melanoma skin cancer)  Prior primary lung cancer (prior non-lung cancer acceptable)  Prior participation in this study (i.e., subjects may not be enrolled more than once)  Current active treatment with an investigational device or drug (patients in trial follow up period are okay if intervention phase is complete)  Patient enrolled or planned to be enrolled in another clinical trial that may influence  management of the patients nodule  Concurrent or planned use of tools or tests for assigning lung nodule risk of malignancy (e.g., genomic or proteomic blood tests) other than clinically validated risk calculators  Clinical Research Coordinator / Research RN note : This visit is for Dailan Pfalzgraf Subject [id number] with DOB: 08/05/1945 on 09/22/2021 for the above protocol is an Enrollment Visit and is for purpose of research.    Subject expressed interest and consent in continuing as a study subject. Subject confirmed contact information (e.g. address, telephone, email). Subject thanked for participation in research and contribution to science.     During this visit on 09/22/2021 , the subject reviewed and signed the consent form, provided demographics, and had a nasal swab collected per the above referenced protocol. Please refer to the subject's paper source binder for further details.   The PI and Sub-I met/discussed the subject prior to consenting patient.  The sub-Investigator Dr. Baltazar Apo was present for the consenting process.         Signed by Robeline 01:49 PM 09/22/2021

## 2021-09-22 NOTE — Patient Instructions (Signed)
We will plan to repeat your CT scan of the chest without contrast in July 2023 to compare with your priors. We will obtain formal copies of your imaging from [facility] We talked today about a clinical trial that may allow [place] to learn more about your pulmonary nodules and any associated risk. Keep albuterol available to use 2 puffs when needed for shortness of breath, chest tightness, wheezing. Follow with Dr Lamonte Sakai in 6 months or sooner if you have any problems.  We will review your CT scan of the chest at that time

## 2021-09-22 NOTE — Addendum Note (Signed)
Addended by: Gavin Potters R on: 09/22/2021 11:58 AM   Modules accepted: Orders

## 2021-09-22 NOTE — Assessment & Plan Note (Signed)
I have access to the written report from [facility] and also small images on his tablet that he was able to download.  Right lower lobe nodules, stable left upper lobe nodule and some right lower lobe inflammatory change.  Moderate risk for primary malignancy given his tobacco history.  I will plan to repeat his CT scan of chest in 6 months.  If suspicion present then will proceed with navigational bronchoscopy or possibly refer him for evaluation for resection.  Because he has multiple nodules bronchoscopy will probably be indicated.  Could consider PET scan at that time as well depending on any interval change.  He is interested in learning about the [employer] trial.  I will ask him to talk to our study coordinators today.

## 2021-09-22 NOTE — Progress Notes (Signed)
Subjective:    Patient ID: Adam Jordan, male    DOB: 10/07/1945, 76 y.o.   MRN: [id number]  HPI 76 year old man, former smoker (50+ pack years), COPD, paroxysmal atrial flutter, hypertension, thoracoabdominal aneurysm (FEVAR 01/2019 at [facility]).  He is here to evaluate pulmonary nodules that were identified on CT chest/abdomen to evaluate his aneurysm. He reports that he does have some intermittent aspiration sx, he is waiting on a partial for his teeth. He is being treated empirically w levaquin for the RLL findings. Breathing and functional capacity are good. Uses albuterol about 2x a month.   CTA chest abdomen pelvis 09/03/2021 reviewed by me showed 9 mm superior right lower lobe nodule (from 7 mm 02/19/21 and 01/04/2020), new 8 mm right lower lobe nodule, stable 6 mm left upper lobe nodule.  New patchy nodular opacity in the right lower lobe.  There were no mediastinal or hilar lymph nodes noted.   Review of Systems As per HPI   Past Medical History:  Diagnosis Date   Anxiety    Arthritis    osteoarthrititis- knees and most joints.   COPD (chronic obstructive pulmonary disease) (HCC)    moderate -no regular use of inhalers- rare use of oxygen as sexual activity   Dyspnea    outside in hot weather   GERD (gastroesophageal reflux disease)    Hyperlipidemia    Hypertension    Neuromuscular disorder (HCC)    right arm ? nerve, occ. gets numbness, tingling in 2 middle fingers occasionally.   Paroxysmal atrial flutter ([place]) 01/15/2021   Thoracoabdominal aneurysm    s/p FEVAR 4 Vessel TABME 02/19/20 Dr. Katy Apo     Family History  Problem Relation Age of Onset   Ovarian cancer Mother    Breast cancer Mother    Macular degeneration Mother    Heart attack Father      Social History   Socioeconomic History   Marital status: Married    Spouse name: Not on file   Number of children: Not on file   Years of education: Not on file   Highest education level: Not on file   Occupational History   Not on file  Tobacco Use   Smoking status: Former    Types: Cigarettes    Quit date: 2020    Years since quitting: 3.1   Smokeless tobacco: Never  Vaping Use   Vaping Use: Never used  Substance and Sexual Activity   Alcohol use: Not Currently   Drug use: No   Sexual activity: Yes  Other Topics Concern   Not on file  Social History Narrative   Not on file   Social Determinants of Health   Financial Resource Strain: Not on file  Food Insecurity: Not on file  Transportation Needs: Not on file  Physical Activity: Not on file  Stress: Not on file  Social Connections: Not on file  Intimate Partner Violence: Not on file    From [place], then to [facility], then [employer] (medical) Used to work Building control surveyor. No asbestos No military   Allergies  Allergen Reactions   Losartan Anaphylaxis   Amlodipine Other (See Comments)    Lightheaded.   Bee Pollen    Pollen Extract      Outpatient Medications Prior to Visit  Medication Sig Dispense Refill   acetaminophen (TYLENOL) 500 MG tablet Take 500 mg by mouth daily as needed for moderate pain or headache.     apixaban (ELIQUIS) 5 MG  TABS tablet Take 1 tablet (5 mg total) by mouth 2 (two) times daily. 180 tablet 3   atorvastatin (LIPITOR) 20 MG tablet Take 20 mg by mouth at bedtime.     clonazePAM (KLONOPIN) 1 MG tablet Take 1 tablet (1 mg total) by mouth 2 (two) times daily as needed for anxiety. (Patient taking differently: Take 1 mg by mouth 2 (two) times daily.) 60 tablet 5   fluticasone (FLONASE) 50 MCG/ACT nasal spray Place 1 spray into both nostrils daily as needed for allergies.     HYDROcodone-acetaminophen (NORCO/VICODIN) 5-325 MG tablet Take 1 tablet by mouth every 6 (six) hours as needed for moderate pain or severe pain. 20 tablet 0   meloxicam (MOBIC) 15 MG tablet Take 15 mg by mouth daily as needed for pain.     Multiple Vitamin (MULTIVITAMIN WITH MINERALS) TABS tablet Take 1 tablet by  mouth daily. 30 tablet 5   Naphazoline HCl (CLEAR EYES OP) Place 1 drop into both eyes as needed (dryness). Unknown strength     OVER THE COUNTER MEDICATION Take 1-2 capsules by mouth at bedtime. Oxy-Powder colon cleanse otc supplement/ Unknown strenght     PROAIR HFA 108 (90 BASE) MCG/ACT inhaler Inhale 2 puffs into the lungs every 6 (six) hours as needed for wheezing or shortness of breath.   2   sildenafil (REVATIO) 20 MG tablet Take 60 mg by mouth daily as needed (ED).     triamcinolone cream (KENALOG) 0.1 % Apply 1 application topically daily as needed (itching).     No facility-administered medications prior to visit.         Objective:   Physical Exam Vitals:   09/22/21 1043  BP: (!) 142/70  Pulse: 70  Temp: 98.2 F (36.8 C)  TempSrc: Oral  SpO2: 99%  Weight: 169 lb 12.8 oz (77 kg)  Height: 5\' 8"  (1.727 m)   Gen: Pleasant, well-nourished, in no distress,  normal affect  ENT: No lesions,  mouth clear,  oropharynx clear, no postnasal drip  Neck: No JVD, no stridor  Lungs: No use of accessory muscles, no crackles or wheezing on normal respiration, no wheeze on forced expiration  Cardiovascular: RRR, heart sounds normal, no murmur or gallops, no peripheral edema  Musculoskeletal: No deformities, no cyanosis or clubbing  Neuro: alert, awake, non focal  Skin: Warm, no lesions or rash      Assessment & Plan:   Pulmonary nodules I have access to the written report from [facility] and also small images on his tablet that he was able to download.  Right lower lobe nodules, stable left upper lobe nodule and some right lower lobe inflammatory change.  Moderate risk for primary malignancy given his tobacco history.  I will plan to repeat his CT scan of chest in 6 months.  If suspicion present then will proceed with navigational bronchoscopy or possibly refer him for evaluation for resection.  Because he has multiple nodules bronchoscopy will probably be indicated.  Could consider  PET scan at that time as well depending on any interval change.  He is interested in learning about the Veracyte trial.  I will ask him to talk to our study coordinators today.   Baltazar Apo, MD, PhD 09/22/2021, 11:14 AM [facility] [phone] or if no answer before 7:00PM call [phone] For any issues after 7:00PM please call eLink [phone]

## 2021-10-07 DIAGNOSIS — M25561 Pain in right knee: Secondary | ICD-10-CM | POA: Diagnosis not present

## 2021-10-07 DIAGNOSIS — M25562 Pain in left knee: Secondary | ICD-10-CM | POA: Diagnosis not present

## 2021-10-07 DIAGNOSIS — M25511 Pain in right shoulder: Secondary | ICD-10-CM | POA: Diagnosis not present

## 2021-10-13 DIAGNOSIS — Z961 Presence of intraocular lens: Secondary | ICD-10-CM | POA: Diagnosis not present

## 2021-10-13 DIAGNOSIS — H52201 Unspecified astigmatism, right eye: Secondary | ICD-10-CM | POA: Diagnosis not present

## 2021-10-13 DIAGNOSIS — H5212 Myopia, left eye: Secondary | ICD-10-CM | POA: Diagnosis not present

## 2021-10-13 DIAGNOSIS — H02834 Dermatochalasis of left upper eyelid: Secondary | ICD-10-CM | POA: Diagnosis not present

## 2021-10-13 DIAGNOSIS — H524 Presbyopia: Secondary | ICD-10-CM | POA: Diagnosis not present

## 2021-10-13 DIAGNOSIS — H02831 Dermatochalasis of right upper eyelid: Secondary | ICD-10-CM | POA: Diagnosis not present

## 2021-10-16 DIAGNOSIS — M19211 Secondary osteoarthritis, right shoulder: Secondary | ICD-10-CM | POA: Diagnosis not present

## 2021-10-28 ENCOUNTER — Telehealth: Payer: Self-pay

## 2021-10-28 NOTE — Telephone Encounter (Signed)
CD with CT Chest (09/03/21) received and a placed in Dr. Agustina Caroli review folder  ?

## 2021-11-04 DIAGNOSIS — Z955 Presence of coronary angioplasty implant and graft: Secondary | ICD-10-CM

## 2021-11-04 DIAGNOSIS — H02831 Dermatochalasis of right upper eyelid: Secondary | ICD-10-CM | POA: Diagnosis not present

## 2021-11-04 DIAGNOSIS — H02834 Dermatochalasis of left upper eyelid: Secondary | ICD-10-CM | POA: Diagnosis not present

## 2021-11-04 DIAGNOSIS — H57813 Brow ptosis, bilateral: Secondary | ICD-10-CM | POA: Diagnosis not present

## 2021-11-04 HISTORY — DX: Presence of coronary angioplasty implant and graft: Z95.5

## 2021-11-12 ENCOUNTER — Telehealth: Payer: Self-pay

## 2021-11-12 NOTE — Telephone Encounter (Signed)
Patient with diagnosis of aflutter on Eliquis for anticoagulation.   ? ?Procedure: bilateral upper lid blepharoplasty, possible direct brow lift ?Date of procedure: TBD ? ?CHA2DS2-VASc Score = 3  ?This indicates a 3.2% annual risk of stroke. ?The patient's score is based upon: ?CHF History: 0 ?HTN History: 1 ?Diabetes History: 0 ?Stroke History: 0 ?Vascular Disease History: 1 ?Age Score: 1 ?Gender Score: 0 ? ?CrCl 33mL/min ?Platelet count 198K ? ?Per office protocol, patient can hold Eliquis for 1-2 days prior to procedure.   ?

## 2021-11-12 NOTE — Telephone Encounter (Signed)
? ?  Pre-operative Risk Assessment  ?  ?Patient Name: Adam Jordan  ?DOB: 02-09-1946 ?MRN: [id number]  ? ?  ? ?Request for Surgical Clearance   ? ?Procedure:   bilateral upper lid blepharoplasty, possible direct brow lift ? ?Date of Surgery:  Clearance TBD                              ?   ?Surgeon:  Celedonio Miyamoto ?Surgeon's Group or Practice Name:  [facility] ?Phone number:  [phone] ?Fax number:  [phone] ?  ?Type of Clearance Requested:   ?- Pharmacy:  Hold Apixaban (Eliquis)   ?  ?Type of Anesthesia:  General  ?  ?Additional requests/questions:   ? ?Signed, ?Maigen Mozingo Tressa Busman   ?11/12/2021, 11:44 AM  ? ?

## 2021-11-12 NOTE — Telephone Encounter (Signed)
? ?  Primary Cardiologist: Jenne Campus, MD ? ?Chart reviewed as part of pre-operative protocol coverage. Given past medical history and time since last visit, based on ACC/AHA guidelines, STIRLING ORTON would be at acceptable risk for the planned procedure without further cardiovascular testing.  ? ?Guidelines for holding Eliquis are as follows: ? ?Patient with diagnosis of aflutter on Eliquis for anticoagulation.   ?  ? ?CHA2DS2-VASc Score = 3  ?This indicates a 3.2% annual risk of stroke. ?The patient's score is based upon: ?CHF History: 0 ?HTN History: 1 ?Diabetes History: 0 ?Stroke History: 0 ?Vascular Disease History: 1 ?Age Score: 1 ?Gender Score: 0 ?  ?CrCl 60mL/min ?Platelet count 198K ?  ?Per office protocol, patient can hold Eliquis for 1-2 days prior to procedure ? ? ?I will route this recommendation to the requesting party via Epic fax function and remove from pre-op pool. ? ?Please call with questions. ? ?Emmaline Life, NP-C ? ?  ?11/12/2021, 1:46 PM ?[place] ?[address] ?Office [phone] Fax [phone] ? ? ?

## 2021-12-03 NOTE — H&P (Signed)
Subjective:  ?  ? Patient ID: Adam Jordan is a 76 y.o. male. ?  ?HPI ?  ?Returns for follow up discussion prior to planned upper blepharoplasty. Reports concern for obstructed vision due to redundant upper eyelid skin. Wears readers. Uses OTC drops daily for tearing, relates this to environment/allergies. No history Botox use. VFE demonstrated visual field obstruction that resolved with taping. ?  ?PMH significant for CAD, TAAA s/p aortobiiliac endovascular stent graft with branch stents for the celiac, SMA, and bilateral renal arteries [facility]).  Follows with [facility]. Last imaging 09/2021, repeat planned 6 mo. ?  ?Seen by Pulmonary for f/u pulmonary nodules noted on above imaging and plan 6 month repeat imaging possible PET at that time. ?  ?PMH also includes atrial flutter on Eliquis, followed by Dr. Agustin Cree. Patient reports he was not on this at time of cataract surgery in 2021. Reports continued Eliquis following recent tooth extraction without complication. ?  ?Lives with spouse. 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 Also has many animals ("farm") on property. ?  ?Patient has returned to smoking cigarettes. ?  ?Review of Systems ?Remainder 12 point review negative ?   ?Objective:  ? Physical Exam ?Cardiovascular:  ?   Rate and Rhythm: Normal rate.  ?   Heart sounds: Normal heart sounds.  ?Pulmonary:  ?   Effort: Pulmonary effort is normal.  ?   Breath sounds: Normal breath sounds.  ?Neurological:  ?   Mental Status: He is oriented to person, place, and time.   ?  ?HEENT: static and dynamic rhytids forehead and glabella, with frontalis blocked, brows positioned below supraorbital rim. With brows manually corrected dermatochalasis still present ?Without brow correction bilateral upper eyelid skin overhanging lid margin. ?Male pattern receeding hairline ?  ?Levator excursion > 12 mm ?-Bells ?   ?Assessment:  ?   ?Dermatochalasis  Brow ptosis ?Chronic anticoagulation ?   ?Plan:  ?   ?Hold Eliquis 2 d prior to surgery. ?  ?Recommend stop all nicotine products especially in light of his vascular surgery. Reviewed effect of nicotine on surgical healing. ? ?Demonstrated in mirror has both brow ptosis and dermatochalasia. Brow surgery alone would leave with continued dermatochalasis. Most complete correction would be both procedures. If proceeds with brow lift would plan direct lift with scar within natural transverse forehead rhytid. Reviewed scar maturation over months. Patient has elected to defer brow lift and proceed with blepharoplasty alone at this time. ?  ?Reviewed upper lid blepharoplasty incisions, OP surgery under sedation vs GA, bruising, post procedure visits and limitations. Reviewed changes with aging including descent brows and may desire additional procedures.  ?  ?Additional risks including but not limited to bleeding, blindness, asymmetry, unacceptable cosmetic result, dry eyes, damage to adjacent structures reviewed. ?  ?Rx for Norco given. ? ?

## 2021-12-04 ENCOUNTER — Encounter (HOSPITAL_BASED_OUTPATIENT_CLINIC_OR_DEPARTMENT_OTHER): Payer: Self-pay | Admitting: Plastic Surgery

## 2021-12-04 ENCOUNTER — Other Ambulatory Visit: Payer: Self-pay

## 2021-12-08 MED ORDER — PROPOFOL 1000 MG/100ML IV EMUL
INTRAVENOUS | Status: AC
  Filled 2021-12-08: qty 100

## 2021-12-08 MED ORDER — PHENYLEPHRINE HCL-NACL 20-0.9 MG/250ML-% IV SOLN
INTRAVENOUS | Status: AC
  Filled 2021-12-08: qty 1000

## 2021-12-11 ENCOUNTER — Ambulatory Visit (HOSPITAL_BASED_OUTPATIENT_CLINIC_OR_DEPARTMENT_OTHER)
Admission: RE | Admit: 2021-12-11 | Discharge: 2021-12-11 | Disposition: A | Discharge status: Home / Self Care | Payer: Medicare HMO | Attending: Plastic Surgery | Admitting: Plastic Surgery

## 2021-12-11 ENCOUNTER — Ambulatory Visit (HOSPITAL_BASED_OUTPATIENT_CLINIC_OR_DEPARTMENT_OTHER): Payer: Medicare HMO | Admitting: Anesthesiology

## 2021-12-11 ENCOUNTER — Encounter (HOSPITAL_BASED_OUTPATIENT_CLINIC_OR_DEPARTMENT_OTHER)
Admission: RE | Disposition: A | Discharge status: Home / Self Care | Payer: Self-pay | Source: Home / Self Care | Attending: Plastic Surgery

## 2021-12-11 ENCOUNTER — Encounter (HOSPITAL_BASED_OUTPATIENT_CLINIC_OR_DEPARTMENT_OTHER): Payer: Self-pay | Admitting: Plastic Surgery

## 2021-12-11 DIAGNOSIS — R69 Illness, unspecified: Secondary | ICD-10-CM | POA: Diagnosis not present

## 2021-12-11 DIAGNOSIS — I4891 Unspecified atrial fibrillation: Secondary | ICD-10-CM | POA: Insufficient documentation

## 2021-12-11 DIAGNOSIS — H57813 Brow ptosis, bilateral: Secondary | ICD-10-CM | POA: Diagnosis not present

## 2021-12-11 DIAGNOSIS — H02831 Dermatochalasis of right upper eyelid: Secondary | ICD-10-CM | POA: Insufficient documentation

## 2021-12-11 DIAGNOSIS — R918 Other nonspecific abnormal finding of lung field: Secondary | ICD-10-CM | POA: Insufficient documentation

## 2021-12-11 DIAGNOSIS — I10 Essential (primary) hypertension: Secondary | ICD-10-CM

## 2021-12-11 DIAGNOSIS — Z7901 Long term (current) use of anticoagulants: Secondary | ICD-10-CM | POA: Insufficient documentation

## 2021-12-11 DIAGNOSIS — I2510 Atherosclerotic heart disease of native coronary artery without angina pectoris: Secondary | ICD-10-CM | POA: Diagnosis not present

## 2021-12-11 DIAGNOSIS — I4892 Unspecified atrial flutter: Secondary | ICD-10-CM | POA: Diagnosis not present

## 2021-12-11 DIAGNOSIS — I739 Peripheral vascular disease, unspecified: Secondary | ICD-10-CM | POA: Diagnosis not present

## 2021-12-11 DIAGNOSIS — F17210 Nicotine dependence, cigarettes, uncomplicated: Secondary | ICD-10-CM | POA: Insufficient documentation

## 2021-12-11 DIAGNOSIS — J449 Chronic obstructive pulmonary disease, unspecified: Secondary | ICD-10-CM | POA: Diagnosis not present

## 2021-12-11 DIAGNOSIS — H02834 Dermatochalasis of left upper eyelid: Secondary | ICD-10-CM | POA: Diagnosis not present

## 2021-12-11 DIAGNOSIS — Z95820 Peripheral vascular angioplasty status with implants and grafts: Secondary | ICD-10-CM | POA: Insufficient documentation

## 2021-12-11 HISTORY — PX: BROW LIFT: SHX178

## 2021-12-11 SURGERY — BLEPHAROPLASTY
Anesthesia: General | Site: Eye | Laterality: Bilateral

## 2021-12-11 MED ORDER — TOBRAMYCIN 0.3 % OP OINT
TOPICAL_OINTMENT | OPHTHALMIC | Status: DC | PRN
  Administered 2021-12-11: 1 via OPHTHALMIC

## 2021-12-11 MED ORDER — PROPOFOL 10 MG/ML IV BOLUS
INTRAVENOUS | Status: DC | PRN
Start: 2021-12-11 — End: 2021-12-11
  Administered 2021-12-11: 150 mg via INTRAVENOUS

## 2021-12-11 MED ORDER — OXYCODONE HCL 5 MG/5ML PO SOLN: 5.0000 mg | Freq: Once | ORAL | Status: DC | PRN

## 2021-12-11 MED ORDER — FENTANYL CITRATE (PF) 100 MCG/2ML IJ SOLN
25.0000 ug | INTRAMUSCULAR | Status: DC | PRN
  Administered 2021-12-11: 50 ug via INTRAVENOUS

## 2021-12-11 MED ORDER — CEFAZOLIN SODIUM-DEXTROSE 2-4 GM/100ML-% IV SOLN
INTRAVENOUS | Status: AC
  Filled 2021-12-11: qty 100

## 2021-12-11 MED ORDER — LIDOCAINE-EPINEPHRINE 1 %-1:100000 IJ SOLN
INTRAMUSCULAR | Status: DC | PRN
  Administered 2021-12-11: 7 mL

## 2021-12-11 MED ORDER — ATROPINE SULFATE 0.4 MG/ML IV SOLN
INTRAVENOUS | Status: AC
  Filled 2021-12-11: qty 1

## 2021-12-11 MED ORDER — BSS IO SOLN
INTRAOCULAR | Status: DC | PRN
Start: 2021-12-11 — End: 2021-12-11
  Administered 2021-12-11: 15 mL via INTRAOCULAR

## 2021-12-11 MED ORDER — TOBRAMYCIN-DEXAMETHASONE 0.3-0.1 % OP OINT
TOPICAL_OINTMENT | OPHTHALMIC | Status: AC
  Filled 2021-12-11: qty 3.5

## 2021-12-11 MED ORDER — ONDANSETRON HCL 4 MG/2ML IJ SOLN
INTRAMUSCULAR | Status: AC
  Filled 2021-12-11: qty 2

## 2021-12-11 MED ORDER — BUPIVACAINE-EPINEPHRINE (PF) 0.25% -1:200000 IJ SOLN
INTRAMUSCULAR | Status: AC
  Filled 2021-12-11: qty 30

## 2021-12-11 MED ORDER — EPHEDRINE SULFATE (PRESSORS) 50 MG/ML IJ SOLN
INTRAMUSCULAR | Status: DC | PRN
  Administered 2021-12-11 (×4): 10 mg via INTRAVENOUS

## 2021-12-11 MED ORDER — LIDOCAINE-EPINEPHRINE 1 %-1:100000 IJ SOLN
INTRAMUSCULAR | Status: AC
  Filled 2021-12-11: qty 1

## 2021-12-11 MED ORDER — FENTANYL CITRATE (PF) 100 MCG/2ML IJ SOLN
INTRAMUSCULAR | Status: AC
  Filled 2021-12-11: qty 2

## 2021-12-11 MED ORDER — BSS IO SOLN
INTRAOCULAR | Status: AC
  Filled 2021-12-11: qty 15

## 2021-12-11 MED ORDER — PHENYLEPHRINE 80 MCG/ML (10ML) SYRINGE FOR IV PUSH (FOR BLOOD PRESSURE SUPPORT)
PREFILLED_SYRINGE | INTRAVENOUS | Status: AC
  Filled 2021-12-11: qty 10

## 2021-12-11 MED ORDER — ACETAMINOPHEN 500 MG PO TABS
1000.0000 mg | ORAL_TABLET | Freq: Once | ORAL | Status: AC
  Administered 2021-12-11: 1000 mg via ORAL

## 2021-12-11 MED ORDER — LIDOCAINE HCL (CARDIAC) PF 100 MG/5ML IV SOSY
PREFILLED_SYRINGE | INTRAVENOUS | Status: DC | PRN
  Administered 2021-12-11: 40 mg via INTRAVENOUS

## 2021-12-11 MED ORDER — OXYCODONE HCL 5 MG PO TABS: 5.0000 mg | ORAL_TABLET | Freq: Once | ORAL | Status: DC | PRN

## 2021-12-11 MED ORDER — LIDOCAINE 2% (20 MG/ML) 5 ML SYRINGE
INTRAMUSCULAR | Status: AC
  Filled 2021-12-11: qty 5

## 2021-12-11 MED ORDER — MEPERIDINE HCL 25 MG/ML IJ SOLN: 6.2500 mg | INTRAMUSCULAR | Status: DC | PRN

## 2021-12-11 MED ORDER — SUCCINYLCHOLINE CHLORIDE 200 MG/10ML IV SOSY
PREFILLED_SYRINGE | INTRAVENOUS | Status: AC
  Filled 2021-12-11: qty 10

## 2021-12-11 MED ORDER — ONDANSETRON HCL 4 MG/2ML IJ SOLN
INTRAMUSCULAR | Status: DC | PRN
  Administered 2021-12-11: 4 mg via INTRAVENOUS

## 2021-12-11 MED ORDER — FENTANYL CITRATE (PF) 100 MCG/2ML IJ SOLN
INTRAMUSCULAR | Status: DC | PRN
Start: 2021-12-11 — End: 2021-12-11
  Administered 2021-12-11: 50 ug via INTRAVENOUS

## 2021-12-11 MED ORDER — EPHEDRINE 5 MG/ML INJ
INTRAVENOUS | Status: AC
  Filled 2021-12-11: qty 5

## 2021-12-11 MED ORDER — PHENYLEPHRINE HCL (PRESSORS) 10 MG/ML IV SOLN
INTRAVENOUS | Status: AC
  Filled 2021-12-11: qty 1

## 2021-12-11 MED ORDER — LACTATED RINGERS IV SOLN: INTRAVENOUS | Status: DC

## 2021-12-11 MED ORDER — CEFAZOLIN SODIUM-DEXTROSE 2-4 GM/100ML-% IV SOLN
2.0000 g | INTRAVENOUS | Status: AC
  Administered 2021-12-11: 2 g via INTRAVENOUS

## 2021-12-11 MED ORDER — ACETAMINOPHEN 500 MG PO TABS
ORAL_TABLET | ORAL | Status: AC
  Filled 2021-12-11: qty 2

## 2021-12-11 MED ORDER — PHENYLEPHRINE HCL (PRESSORS) 10 MG/ML IV SOLN
INTRAVENOUS | Status: DC | PRN
  Administered 2021-12-11: 80 ug via INTRAVENOUS
  Administered 2021-12-11 (×2): 40 ug via INTRAVENOUS

## 2021-12-11 MED ORDER — DEXAMETHASONE SODIUM PHOSPHATE 4 MG/ML IJ SOLN
INTRAMUSCULAR | Status: DC | PRN
  Administered 2021-12-11: 5 mg via INTRAVENOUS

## 2021-12-11 MED ORDER — MIDAZOLAM HCL 2 MG/2ML IJ SOLN: 0.5000 mg | Freq: Once | INTRAMUSCULAR | Status: DC | PRN

## 2021-12-11 MED ORDER — PHENYLEPHRINE HCL-NACL 20-0.9 MG/250ML-% IV SOLN
INTRAVENOUS | Status: DC | PRN
  Administered 2021-12-11: 30 ug/min via INTRAVENOUS

## 2021-12-11 MED ORDER — DEXAMETHASONE SODIUM PHOSPHATE 10 MG/ML IJ SOLN
INTRAMUSCULAR | Status: AC
  Filled 2021-12-11: qty 1

## 2021-12-11 SURGICAL SUPPLY — 54 items
ADH SKN CLS APL DERMABOND .7 (GAUZE/BANDAGES/DRESSINGS)
APL SRG 3 HI ABS STRL LF PLS (MISCELLANEOUS) ×1
APL SWBSTK 6 STRL LF DISP (MISCELLANEOUS)
APPLICATOR COTTON TIP 6 STRL (MISCELLANEOUS) IMPLANT
APPLICATOR COTTON TIP 6IN STRL (MISCELLANEOUS)
APPLICATOR DR MATTHEWS STRL (MISCELLANEOUS) ×1 IMPLANT
BLADE SURG 15 STRL LF DISP TIS (BLADE) ×1 IMPLANT
BLADE SURG 15 STRL SS (BLADE) ×4
BNDG EYE OVAL (GAUZE/BANDAGES/DRESSINGS) IMPLANT
CORD BIPOLAR FORCEPS 12FT (ELECTRODE) IMPLANT
COVER BACK TABLE 60X90IN (DRAPES) ×2 IMPLANT
COVER MAYO STAND STRL (DRAPES) ×2 IMPLANT
DERMABOND ADVANCED (GAUZE/BANDAGES/DRESSINGS)
DERMABOND ADVANCED .7 DNX12 (GAUZE/BANDAGES/DRESSINGS) IMPLANT
DRAPE U-SHAPE 76X120 STRL (DRAPES) ×2 IMPLANT
DRAPE UTILITY XL STRL (DRAPES) IMPLANT
ELECT NDL BLADE 2-5/6 (NEEDLE) ×1 IMPLANT
ELECT NEEDLE BLADE 2-5/6 (NEEDLE) ×2 IMPLANT
ELECT REM PT RETURN 9FT ADLT (ELECTROSURGICAL) ×2
ELECTRODE REM PT RTRN 9FT ADLT (ELECTROSURGICAL) ×1 IMPLANT
GLOVE BIO SURGEON STRL SZ 6 (GLOVE) ×2 IMPLANT
GLOVE BIO SURGEON STRL SZ 6.5 (GLOVE) ×1 IMPLANT
GLOVE BIOGEL PI IND STRL 6.5 (GLOVE) IMPLANT
GLOVE BIOGEL PI IND STRL 7.0 (GLOVE) IMPLANT
GLOVE BIOGEL PI INDICATOR 6.5 (GLOVE) ×1
GLOVE BIOGEL PI INDICATOR 7.0 (GLOVE) ×1
GOWN STRL REUS W/ TWL LRG LVL3 (GOWN DISPOSABLE) ×2 IMPLANT
GOWN STRL REUS W/ TWL XL LVL3 (GOWN DISPOSABLE) IMPLANT
GOWN STRL REUS W/TWL LRG LVL3 (GOWN DISPOSABLE) ×2
GOWN STRL REUS W/TWL XL LVL3 (GOWN DISPOSABLE) ×2
MARKER SKIN DUAL TIP RULER LAB (MISCELLANEOUS) ×1 IMPLANT
NDL HYPO 27GX1-1/4 (NEEDLE) IMPLANT
NDL HYPO 30GX1 BEV (NEEDLE) IMPLANT
NEEDLE HYPO 27GX1-1/4 (NEEDLE) ×2 IMPLANT
NEEDLE HYPO 30GX1 BEV (NEEDLE) IMPLANT
PACK BASIN DAY SURGERY FS (CUSTOM PROCEDURE TRAY) ×2 IMPLANT
PENCIL SMOKE EVACUATOR (MISCELLANEOUS) ×2 IMPLANT
SHEILD EYE MED CORNL SHD 22X21 (OPHTHALMIC RELATED) ×4
SHIELD EYE MED CORNL SHD 22X21 (OPHTHALMIC RELATED) ×1 IMPLANT
SLEEVE SCD COMPRESS KNEE MED (STOCKING) ×2 IMPLANT
SPIKE FLUID TRANSFER (MISCELLANEOUS) IMPLANT
STAPLER VISISTAT 35W (STAPLE) ×1 IMPLANT
STRIP CLOSURE SKIN 1/2X4 (GAUZE/BANDAGES/DRESSINGS) ×2 IMPLANT
SUT MERSILENE 4 0 P 3 (SUTURE) IMPLANT
SUT MNCRL 6-0 UNDY P1 1X18 (SUTURE) IMPLANT
SUT MONOCRYL 6-0 P1 1X18 (SUTURE)
SUT PLAIN 5 0 P 3 18 (SUTURE) IMPLANT
SUT PROLENE 6 0 P 1 18 (SUTURE) ×1 IMPLANT
SUT PROLENE 6 0 PC 1 (SUTURE) IMPLANT
SUT VIC AB 5-0 P-3 18X BRD (SUTURE) IMPLANT
SUT VIC AB 5-0 P3 18 (SUTURE)
SYR CONTROL 10ML LL (SYRINGE) ×2 IMPLANT
TOWEL GREEN STERILE FF (TOWEL DISPOSABLE) ×3 IMPLANT
TRAY DSU PREP LF (CUSTOM PROCEDURE TRAY) ×2 IMPLANT

## 2021-12-11 NOTE — Interval H&P Note (Signed)
History and Physical Interval Note: ? ?12/11/2021 ?10:38 AM ? ?Adam Jordan  has presented today for surgery, with the diagnosis of dermatochalasis bilateral upper eyelids, brow ptosis.  The various methods of treatment have been discussed with the patient and family. After consideration of risks, benefits and other options for treatment, the patient has consented to  Procedure(s): ?UPPER LID BLEPHAROPLASTY (Bilateral) as a surgical intervention.  The patient's history has been reviewed, patient examined, no change in status, stable for surgery.  I have reviewed the patient's chart and labs.  Questions were answered to the patient's satisfaction.   ? ? ?Koree Staheli ? ? ?

## 2021-12-11 NOTE — Anesthesia Preprocedure Evaluation (Addendum)
Anesthesia Evaluation  ?Patient identified by MRN, date of birth, ID band ?Patient awake ? ? ? ?Reviewed: ?Allergy & Precautions, NPO status , Patient's Chart, lab work & pertinent test results ? ?History of Anesthesia Complications ?Negative for: history of anesthetic complications ? ?Airway ?Mallampati: II ? ?TM Distance: >3 FB ?Neck ROM: Full ? ? ? Dental ? ?(+) Dental Advisory Given, Partial Upper ?  ?Pulmonary ?COPD,  COPD inhaler, Current Smoker and Patient abstained from smoking.,  ?  ?breath sounds clear to auscultation ? ? ? ? ? ? Cardiovascular ?hypertension (no medications presently), (-) angina+ Peripheral Vascular Disease (s/p thoracoabd aneurysm stent graft)  ?+ dysrhythmias Atrial Fibrillation  ?Rhythm:Regular Rate:Normal ? ?'22 ECHO: EF 60-65%. The LV has normal function, no regional wall motion abnormalities. There is mild LVH.  RVF is normal. No significant valvular abnormalities  ? ?'22 Stress: EF 63% no ischemia or MI, low risk ?  ?Neuro/Psych ?Anxiety negative neurological ROS ?   ? GI/Hepatic ?negative GI ROS, (+)  ?  ? substance abuse ? alcohol use,   ?Endo/Other  ?negative endocrine ROS ? Renal/GU ?negative Renal ROS  ? ?  ?Musculoskeletal ? ?(+) Arthritis ,  ? Abdominal ?  ?Peds ? Hematology ?Eliquis: last dose Tuesday   ?Anesthesia Other Findings ? ? Reproductive/Obstetrics ? ?  ? ? ? ? ? ? ? ? ? ? ? ? ? ?  ?  ? ? ? ? ? ? ? ?Anesthesia Physical ?Anesthesia Plan ? ?ASA: 3 ? ?Anesthesia Plan: General  ? ?Post-op Pain Management: Tylenol PO (pre-op)*  ? ?Induction: Intravenous ? ?PONV Risk Score and Plan: 1 and Ondansetron and Dexamethasone ? ?Airway Management Planned: LMA ? ?Additional Equipment: None ? ?Intra-op Plan:  ? ?Post-operative Plan:  ? ?Informed Consent: I have reviewed the patients History and Physical, chart, labs and discussed the procedure including the risks, benefits and alternatives for the proposed anesthesia with the patient or authorized  representative who has indicated his/her understanding and acceptance.  ? ? ? ?Dental advisory given ? ?Plan Discussed with: CRNA and Surgeon ? ?Anesthesia Plan Comments:   ? ? ? ? ? ?Anesthesia Quick Evaluation ? ?

## 2021-12-11 NOTE — Anesthesia Procedure Notes (Signed)
Procedure Name: LMA Insertion ?Date/Time: 12/11/2021 11:22 AM ?Performed by: Willa Frater, CRNA ?Pre-anesthesia Checklist: Patient identified, Emergency Drugs available, Suction available and Patient being monitored ?Patient Re-evaluated:Patient Re-evaluated prior to induction ?Oxygen Delivery Method: Circle system utilized ?Preoxygenation: Pre-oxygenation with 100% oxygen ?Induction Type: IV induction ?Ventilation: Mask ventilation without difficulty ?LMA: LMA flexible inserted ?LMA Size: 4.0 ?Number of attempts: 1 ?Airway Equipment and Method: Bite block ?Placement Confirmation: positive ETCO2 ?Tube secured with: Tape ?Dental Injury: Teeth and Oropharynx as per pre-operative assessment  ? ? ? ? ?

## 2021-12-11 NOTE — Op Note (Signed)
Operative Note  ? ?DATE OF OPERATION: 5.12.23 ? ?LOCATION: [facility]-outpatient ? ?SURGICAL DIVISION: Plastic Surgery ? ?PREOPERATIVE DIAGNOSES:  Dermatochalasis ? ?POSTOPERATIVE DIAGNOSES:  same ? ?PROCEDURE:  Bilateral upper eyelid blepharoplasty with excess ive skin weighting eyelid down  ? ?SURGEON: Irene Limbo MD MBA ? ?ASSISTANT: none ? ?ANESTHESIA:  General.  ? ?EBL: 5 ml ? ?COMPLICATIONS: None immediate.  ? ?INDICATIONS FOR PROCEDURE:  ?The patient, Adam Jordan, is a 76 y.o. male born on 12-Sep-1945, is here for treatment dermatochalasis with documented obstructed peripheral vision. ?  ?FINDINGS: Skin only resection completed bilateral. ? ?DESCRIPTION OF PROCEDURE:  ?The patient's operative site was confirmed with the patient in the preoperative area. The patient was taken to the operating room. SCDs were placed and IV antibiotics were given. Opthalmic lubricant and scleral shields placed bilateral. The patient's operative site was prepped and draped in a sterile fashion. A time out was performed and all information was confirmed to be correct.  ? ?Caudal border for resection marked 7 mm from lash line bilateral. Cephalic extent resection marked at junction brow and eyelid skin. Local anesthetic infiltrated to perform bilateral supraorbital and infraorbital nerve blocks and within area of planned resection. I began on right side. Incision completed sharply. Skin only excision completed with aid of cautery. Hemostasis obtained.  Skin closure completed with simple interrupted and running 6-0 prolene. ? ?I then directed attention to left side. Incision completed sharply. Skin only excision completed with aid of cautery. Skin closure completed with simple interrupted and running 6-0 prolene. ? ?Scleral shields removed and eyes irrigated with basic salt solution. Tobradex ointment placed in each eye.  ? ?The patient was allowed to wake from anesthesia, extubated and taken to the recovery room in  satisfactory condition.  ? ?SPECIMENS: none ? ?DRAINS: none ? ?Irene Limbo, MD MBA ?Plastic & Reconstructive Surgery ? ?Office/ physician access line after hours [phone]   ?

## 2021-12-11 NOTE — Transfer of Care (Signed)
Immediate Anesthesia Transfer of Care Note ? ?Patient: Adam Jordan ? ?Procedure(s) Performed: UPPER LID BLEPHAROPLASTY (Bilateral: Eye) ? ?Patient Location: PACU ? ?Anesthesia Type:General ? ?Level of Consciousness: awake, alert , oriented, drowsy and patient cooperative ? ?Airway & Oxygen Therapy: Patient Spontanous Breathing and Patient connected to face mask oxygen ? ?Post-op Assessment: Report given to RN and Post -op Vital signs reviewed and stable ? ?Post vital signs: Reviewed and stable ? ?Last Vitals:  ?Vitals Value Taken Time  ?BP    ?Temp    ?Pulse 73 12/11/21 1221  ?Resp 20 12/11/21 1221  ?SpO2 95 % 12/11/21 1221  ?Vitals shown include unvalidated device data. ? ?Last Pain:  ?Vitals:  ? 12/11/21 0958  ?TempSrc: Oral  ?PainSc: 0-No pain  ?   ? ?Patients Stated Pain Goal: 3 (12/11/21 7616) ? ?Complications: No notable events documented. ?

## 2021-12-11 NOTE — Anesthesia Postprocedure Evaluation (Signed)
Anesthesia Post Note ? ?Patient: Adam Jordan ? ?Procedure(s) Performed: UPPER LID BLEPHAROPLASTY (Bilateral: Eye) ? ?  ? ?Patient location during evaluation: Phase II ?Anesthesia Type: General ?Level of consciousness: awake and alert, patient cooperative and oriented ?Pain management: pain level controlled ?Vital Signs Assessment: post-procedure vital signs reviewed and stable ?Respiratory status: spontaneous breathing, nonlabored ventilation and respiratory function stable ?Cardiovascular status: blood pressure returned to baseline and stable ?Postop Assessment: no apparent nausea or vomiting and able to ambulate ?Anesthetic complications: no ? ? ?No notable events documented. ? ?Last Vitals:  ?Vitals:  ? 12/11/21 1315 12/11/21 1338  ?BP: (!) 102/55 (!) 110/55  ?Pulse: 69 68  ?Resp: 12 14  ?Temp:  36.5 ?C  ?SpO2: 94% 94%  ?  ?Last Pain:  ?Vitals:  ? 12/11/21 1338  ?TempSrc:   ?PainSc: 4   ? ? ?  ?  ?  ?  ?  ?  ? ?Sarah Zerby,E. Javaun Dimperio ? ? ? ? ?

## 2021-12-11 NOTE — Discharge Instructions (Signed)

## 2021-12-14 ENCOUNTER — Encounter (HOSPITAL_BASED_OUTPATIENT_CLINIC_OR_DEPARTMENT_OTHER): Payer: Self-pay | Admitting: Plastic Surgery

## 2021-12-14 NOTE — Progress Notes (Signed)
Left message stating courtesy call and if any questions or concerns please call the doctors office.  

## 2021-12-18 DIAGNOSIS — Z79899 Other long term (current) drug therapy: Secondary | ICD-10-CM | POA: Diagnosis not present

## 2021-12-18 DIAGNOSIS — Z9889 Other specified postprocedural states: Secondary | ICD-10-CM | POA: Diagnosis not present

## 2021-12-18 DIAGNOSIS — R7303 Prediabetes: Secondary | ICD-10-CM | POA: Diagnosis not present

## 2021-12-18 DIAGNOSIS — E7800 Pure hypercholesterolemia, unspecified: Secondary | ICD-10-CM | POA: Diagnosis not present

## 2021-12-30 DIAGNOSIS — M1990 Unspecified osteoarthritis, unspecified site: Secondary | ICD-10-CM | POA: Diagnosis not present

## 2021-12-30 DIAGNOSIS — J449 Chronic obstructive pulmonary disease, unspecified: Secondary | ICD-10-CM | POA: Diagnosis not present

## 2021-12-30 DIAGNOSIS — E7800 Pure hypercholesterolemia, unspecified: Secondary | ICD-10-CM | POA: Diagnosis not present

## 2021-12-30 DIAGNOSIS — G4700 Insomnia, unspecified: Secondary | ICD-10-CM | POA: Diagnosis not present

## 2022-01-01 DIAGNOSIS — Z0000 Encounter for general adult medical examination without abnormal findings: Secondary | ICD-10-CM | POA: Diagnosis not present

## 2022-01-01 DIAGNOSIS — Z23 Encounter for immunization: Secondary | ICD-10-CM | POA: Diagnosis not present

## 2022-01-01 DIAGNOSIS — I700 Atherosclerosis of aorta: Secondary | ICD-10-CM | POA: Diagnosis not present

## 2022-01-01 DIAGNOSIS — I714 Abdominal aortic aneurysm, without rupture, unspecified: Secondary | ICD-10-CM | POA: Diagnosis not present

## 2022-01-01 DIAGNOSIS — R7309 Other abnormal glucose: Secondary | ICD-10-CM | POA: Diagnosis not present

## 2022-01-01 DIAGNOSIS — R946 Abnormal results of thyroid function studies: Secondary | ICD-10-CM | POA: Diagnosis not present

## 2022-01-01 DIAGNOSIS — I4892 Unspecified atrial flutter: Secondary | ICD-10-CM | POA: Diagnosis not present

## 2022-01-01 DIAGNOSIS — Z125 Encounter for screening for malignant neoplasm of prostate: Secondary | ICD-10-CM | POA: Diagnosis not present

## 2022-01-01 DIAGNOSIS — Z95828 Presence of other vascular implants and grafts: Secondary | ICD-10-CM | POA: Diagnosis not present

## 2022-01-01 DIAGNOSIS — Z1211 Encounter for screening for malignant neoplasm of colon: Secondary | ICD-10-CM | POA: Diagnosis not present

## 2022-01-01 DIAGNOSIS — J449 Chronic obstructive pulmonary disease, unspecified: Secondary | ICD-10-CM | POA: Diagnosis not present

## 2022-01-25 DIAGNOSIS — Z1211 Encounter for screening for malignant neoplasm of colon: Secondary | ICD-10-CM | POA: Diagnosis not present

## 2022-01-25 DIAGNOSIS — Z1212 Encounter for screening for malignant neoplasm of rectum: Secondary | ICD-10-CM | POA: Diagnosis not present

## 2022-02-01 LAB — COLOGUARD: COLOGUARD: NEGATIVE

## 2022-02-08 ENCOUNTER — Ambulatory Visit (HOSPITAL_BASED_OUTPATIENT_CLINIC_OR_DEPARTMENT_OTHER)
Admission: RE | Admit: 2022-02-08 | Discharge: 2022-02-08 | Disposition: A | Discharge status: Home / Self Care | Payer: Medicare HMO | Source: Ambulatory Visit | Attending: Emergency Medicine | Admitting: Emergency Medicine

## 2022-02-08 DIAGNOSIS — I700 Atherosclerosis of aorta: Secondary | ICD-10-CM | POA: Diagnosis not present

## 2022-02-08 DIAGNOSIS — R911 Solitary pulmonary nodule: Secondary | ICD-10-CM | POA: Diagnosis not present

## 2022-02-08 DIAGNOSIS — J439 Emphysema, unspecified: Secondary | ICD-10-CM | POA: Diagnosis not present

## 2022-02-08 DIAGNOSIS — R918 Other nonspecific abnormal finding of lung field: Secondary | ICD-10-CM | POA: Diagnosis not present

## 2022-02-08 DIAGNOSIS — R590 Localized enlarged lymph nodes: Secondary | ICD-10-CM | POA: Insufficient documentation

## 2022-02-08 DIAGNOSIS — I2510 Atherosclerotic heart disease of native coronary artery without angina pectoris: Secondary | ICD-10-CM | POA: Insufficient documentation

## 2022-02-17 ENCOUNTER — Encounter: Payer: Self-pay | Admitting: Emergency Medicine

## 2022-02-17 ENCOUNTER — Ambulatory Visit (INDEPENDENT_AMBULATORY_CARE_PROVIDER_SITE_OTHER): Payer: Medicare HMO | Admitting: Emergency Medicine

## 2022-02-17 VITALS — BP 134/76 | HR 67 | Temp 98.7°F | Ht 68.0 in | Wt 172.6 lb

## 2022-02-17 DIAGNOSIS — J449 Chronic obstructive pulmonary disease, unspecified: Secondary | ICD-10-CM

## 2022-02-17 DIAGNOSIS — R918 Other nonspecific abnormal finding of lung field: Secondary | ICD-10-CM | POA: Diagnosis not present

## 2022-02-17 DIAGNOSIS — J301 Allergic rhinitis due to pollen: Secondary | ICD-10-CM | POA: Diagnosis not present

## 2022-02-17 NOTE — Assessment & Plan Note (Signed)
Enlarging right lower lobe nodule, stable but suspicious right upper lobe nodule, right hilar and mediastinal adenopathy.  Suspicious for primary lung cancer.  Planning for bronchoscopy to get a tissue diagnosis.  Reviewed the risk, benefits, rationale with him and he agrees to proceed.  We reviewed your CT scan of the chest today We will work on arranging bronchoscopy to evaluate pulmonary nodules and enlarged lymph nodes.  This will be done under general anesthesia as an outpatient at [facility] endoscopy.  You will need a designated driver.  You will need to stop your Eliquis 2 days prior.  We will try to get this scheduled for 02/23/2022 or 03/01/2022. Follow Dr. Lamonte Sakai in 1 month or next available

## 2022-02-17 NOTE — H&P (View-Only) (Signed)
Subjective:    Patient ID: Adam Jordan, male    DOB: [birth year], 76 y.o.   MRN: [id number]  HPI 76 year old man, former smoker (50+ pack years), COPD, paroxysmal atrial flutter, hypertension, thoracoabdominal aneurysm (FEVAR 01/2019 at [facility]).  He is here to evaluate pulmonary nodules that were identified on CT chest/abdomen to evaluate his aneurysm. He reports that he does have some intermittent aspiration sx, he is waiting on a partial for his teeth. He is being treated empirically w levaquin for the RLL findings. Breathing and functional capacity are good. Uses albuterol about 2x a month.   CTA chest abdomen pelvis 09/03/2021 reviewed by me showed 9 mm superior right lower lobe nodule (from 7 mm 02/19/21 and 01/04/2020), new 8 mm right lower lobe nodule, stable 6 mm left upper lobe nodule.  New patchy nodular opacity in the right lower lobe.  There were no mediastinal or hilar lymph nodes noted.  ROV 02/17/22 --follow-up visit 76 year old former smoker with COPD and thoracoabdominal aneurysm treated surgically 01/2019 at [facility].  I saw him for pulmonary nodules that were noted on CTA chest/abdomen/pelvis in February, specifically a 9 mm superior right lower lobe nodule, new 8 mm right lower lobe nodule, stable 6 mm left upper lobe nodule and a patchy nodular opacity in the right lower lobe.  Deemed to be moderate risk.  We planned for a repeat CT chest at the 93-month mark.  CT chest 02/08/2022 reviewed by me, shows biapical scarring, a spiculated 8 mm right lower lobe nodule, lateral right lower lobe nodule 1.2 x 2.8 cm.  There is right hilar and subcarinal adenopathy.   Review of Systems As per HPI   Past Medical History:  Diagnosis Date   Anxiety    Arthritis    osteoarthrititis- knees and most joints.   COPD (chronic obstructive pulmonary disease) (HCC)    moderate -no regular use of inhalers- rare use of oxygen as sexual activity   Dyspnea    outside in hot weather   Hyperlipidemia     Hypertension    Neuromuscular disorder (HCC)    right arm ? nerve, occ. gets numbness, tingling in 2 middle fingers occasionally.   Paroxysmal atrial flutter ([place]) 01/15/2021   Thoracoabdominal aneurysm [facility])    s/p FEVAR 4 Vessel TABME 02/19/20 Dr. Katy Apo     Family History  Problem Relation Age of Onset   Ovarian cancer Mother    Breast cancer Mother    Macular degeneration Mother    Heart attack Father      Social History   Socioeconomic History   Marital status: Married    Spouse name: Not on file   Number of children: Not on file   Years of education: Not on file   Highest education level: Not on file  Occupational History   Not on file  Tobacco Use   Smoking status: Former    Years: 50.00    Types: Cigarettes    Quit date: 2020    Years since quitting: 3.5   Smokeless tobacco: Never   Tobacco comments:    Quit 2 months ago 02/17/22 ARJ   Vaping Use   Vaping Use: Never used  Substance and Sexual Activity   Alcohol use: Not Currently   Drug use: No   Sexual activity: Yes  Other Topics Concern   Not on file  Social History Narrative   Not on file   Social Determinants of Health   Financial Resource Strain: Not on  file  Food Insecurity: Not on file  Transportation Needs: Not on file  Physical Activity: Not on file  Stress: Not on file  Social Connections: Not on file  Intimate Partner Violence: Not on file    From [place], then to [facility], then [employer] (medical) 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     apixaban (ELIQUIS) 5 MG TABS tablet Take 1 tablet (5 mg total) by mouth 2 (two) times daily. 180 tablet 3   atorvastatin (LIPITOR) 20 MG tablet Take 20 mg by mouth at bedtime.      clonazePAM (KLONOPIN) 1 MG tablet Take 1 tablet (1 mg total) by mouth 2 (two) times daily as needed for anxiety. (Patient taking differently: Take 1 mg by mouth 2 (two) times daily.) 60 tablet 5   fluticasone (FLONASE) 50 MCG/ACT nasal spray Place 1 spray into both nostrils daily as needed for allergies.     meloxicam (MOBIC) 15 MG tablet Take 15 mg by mouth daily as needed for pain.     Multiple Vitamin (MULTIVITAMIN WITH MINERALS) TABS tablet Take 1 tablet by mouth daily. 30 tablet 5   Naphazoline HCl (CLEAR EYES OP) Place 1 drop into both eyes as needed (dryness). Unknown strength     OVER THE COUNTER MEDICATION Take 1-2 capsules by mouth at bedtime. Oxy-Powder colon cleanse otc supplement/ Unknown strenght     PROAIR HFA 108 (90 BASE) MCG/ACT inhaler Inhale 2 puffs into the lungs every 6 (six) hours as needed for wheezing or shortness of breath.   2   sildenafil (REVATIO) 20 MG tablet Take 60 mg by mouth daily as needed (ED).     HYDROcodone-acetaminophen (NORCO/VICODIN) 5-325 MG tablet Take 1 tablet by mouth every 6 (six) hours as needed for moderate pain or severe pain. 20 tablet 0   No facility-administered medications prior to visit.         Objective:   Physical Exam Vitals:   02/17/22 1042  BP: 134/76  Pulse: 67  Temp: 98.7 F (37.1 C)  TempSrc: Oral  SpO2: 96%  Weight: 172 lb 9.6 oz (78.3 kg)  Height: 5\' 8"  (1.727 m)   Gen: Pleasant, well-nourished, in no distress,  normal affect  ENT: No lesions,  mouth clear,  oropharynx clear, no postnasal drip  Neck: No JVD, no stridor  Lungs: No use of accessory muscles, no crackles or wheezing on normal respiration, no wheeze on forced expiration  Cardiovascular: RRR, heart sounds normal, no murmur or gallops, no peripheral edema  Musculoskeletal: No deformities, no cyanosis or clubbing  Neuro: alert, awake, non focal  Skin: Warm, no lesions or rash      Assessment & Plan:   Pulmonary nodules Enlarging right lower  lobe nodule, stable but suspicious right upper lobe nodule, right hilar and mediastinal adenopathy.  Suspicious for primary lung cancer.  Planning for bronchoscopy to get a tissue diagnosis.  Reviewed the risk, benefits, rationale with him and he agrees to proceed.  We reviewed your CT scan of the chest today We will work on arranging bronchoscopy to evaluate pulmonary nodules and enlarged lymph nodes.  This will be done under general anesthesia as an outpatient at [facility] endoscopy.  You will  need a designated driver.  You will need to stop your Eliquis 2 days prior.  We will try to get this scheduled for 02/23/2022 or 03/01/2022. Follow Dr. Lamonte Sakai in 1 month or next available  COPD, mild [facility]) Not currently on scheduled bronchodilator therapy.  Allergic rhinitis Fluticasone nasal spray   Baltazar Apo, MD, PhD 02/17/2022, 11:08 AM [facility] [phone] or if no answer before 7:00PM call [phone] For any issues after 7:00PM please call eLink [phone]

## 2022-02-17 NOTE — Patient Instructions (Addendum)
We reviewed your CT scan of the chest today We will work on arranging bronchoscopy to evaluate pulmonary nodules and enlarged lymph nodes.  This will be done under general anesthesia as an outpatient at [facility] endoscopy.  You will need a designated driver.  You will need to stop your Eliquis 2 days prior.  We will try to get this scheduled for 02/23/2022 or 03/01/2022. Follow Dr. Lamonte Sakai in 1 month or next available

## 2022-02-17 NOTE — Progress Notes (Signed)
Subjective:    Patient ID: Adam Jordan, male    DOB: 25-Jul-1946, 76 y.o.   MRN: [id number]  HPI 76 year old man, former smoker (50+ pack years), COPD, paroxysmal atrial flutter, hypertension, thoracoabdominal aneurysm (FEVAR 01/2019 at [facility]).  He is here to evaluate pulmonary nodules that were identified on CT chest/abdomen to evaluate his aneurysm. He reports that he does have some intermittent aspiration sx, he is waiting on a partial for his teeth. He is being treated empirically w levaquin for the RLL findings. Breathing and functional capacity are good. Uses albuterol about 2x a month.   CTA chest abdomen pelvis 09/03/2021 reviewed by me showed 9 mm superior right lower lobe nodule (from 7 mm 02/19/21 and 01/04/2020), new 8 mm right lower lobe nodule, stable 6 mm left upper lobe nodule.  New patchy nodular opacity in the right lower lobe.  There were no mediastinal or hilar lymph nodes noted.  ROV 02/17/22 --follow-up visit 76 year old former smoker with COPD and thoracoabdominal aneurysm treated surgically 01/2019 at [facility].  I saw him for pulmonary nodules that were noted on CTA chest/abdomen/pelvis in February, specifically a 9 mm superior right lower lobe nodule, new 8 mm right lower lobe nodule, stable 6 mm left upper lobe nodule and a patchy nodular opacity in the right lower lobe.  Deemed to be moderate risk.  We planned for a repeat CT chest at the 60-month mark.  CT chest 02/08/2022 reviewed by me, shows biapical scarring, a spiculated 8 mm right lower lobe nodule, lateral right lower lobe nodule 1.2 x 2.8 cm.  There is right hilar and subcarinal adenopathy.   Review of Systems As per HPI   Past Medical History:  Diagnosis Date   Anxiety    Arthritis    osteoarthrititis- knees and most joints.   COPD (chronic obstructive pulmonary disease) (HCC)    moderate -no regular use of inhalers- rare use of oxygen as sexual activity   Dyspnea    outside in hot weather   Hyperlipidemia     Hypertension    Neuromuscular disorder (HCC)    right arm ? nerve, occ. gets numbness, tingling in 2 middle fingers occasionally.   Paroxysmal atrial flutter ([place]) 01/15/2021   Thoracoabdominal aneurysm [facility])    s/p FEVAR 4 Vessel TABME 02/19/20 Dr. Katy Apo     Family History  Problem Relation Age of Onset   Ovarian cancer Mother    Breast cancer Mother    Macular degeneration Mother    Heart attack Father      Social History   Socioeconomic History   Marital status: Married    Spouse name: Not on file   Number of children: Not on file   Years of education: Not on file   Highest education level: Not on file  Occupational History   Not on file  Tobacco Use   Smoking status: Former    Years: 50.00    Types: Cigarettes    Quit date: 2020    Years since quitting: 3.5   Smokeless tobacco: Never   Tobacco comments:    Quit 2 months ago 02/17/22 ARJ   Vaping Use   Vaping Use: Never used  Substance and Sexual Activity   Alcohol use: Not Currently   Drug use: No   Sexual activity: Yes  Other Topics Concern   Not on file  Social History Narrative   Not on file   Social Determinants of Health   Financial Resource Strain: Not on  file  Food Insecurity: Not on file  Transportation Needs: Not on file  Physical Activity: Not on file  Stress: Not on file  Social Connections: Not on file  Intimate Partner Violence: Not on file    From [place], then to [facility], then [place] [employer] (medical) 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     apixaban (ELIQUIS) 5 MG TABS tablet Take 1 tablet (5 mg total) by mouth 2 (two) times daily. 180 tablet 3   atorvastatin (LIPITOR) 20 MG tablet Take 20 mg by mouth at bedtime.      clonazePAM (KLONOPIN) 1 MG tablet Take 1 tablet (1 mg total) by mouth 2 (two) times daily as needed for anxiety. (Patient taking differently: Take 1 mg by mouth 2 (two) times daily.) 60 tablet 5   fluticasone (FLONASE) 50 MCG/ACT nasal spray Place 1 spray into both nostrils daily as needed for allergies.     meloxicam (MOBIC) 15 MG tablet Take 15 mg by mouth daily as needed for pain.     Multiple Vitamin (MULTIVITAMIN WITH MINERALS) TABS tablet Take 1 tablet by mouth daily. 30 tablet 5   Naphazoline HCl (CLEAR EYES OP) Place 1 drop into both eyes as needed (dryness). Unknown strength     OVER THE COUNTER MEDICATION Take 1-2 capsules by mouth at bedtime. Oxy-Powder colon cleanse otc supplement/ Unknown strenght     PROAIR HFA 108 (90 BASE) MCG/ACT inhaler Inhale 2 puffs into the lungs every 6 (six) hours as needed for wheezing or shortness of breath.   2   sildenafil (REVATIO) 20 MG tablet Take 60 mg by mouth daily as needed (ED).     HYDROcodone-acetaminophen (NORCO/VICODIN) 5-325 MG tablet Take 1 tablet by mouth every 6 (six) hours as needed for moderate pain or severe pain. 20 tablet 0   No facility-administered medications prior to visit.         Objective:   Physical Exam Vitals:   02/17/22 1042  BP: 134/76  Pulse: 67  Temp: 98.7 F (37.1 C)  TempSrc: Oral  SpO2: 96%  Weight: 172 lb 9.6 oz (78.3 kg)  Height: 5\' 8"  (1.727 m)   Gen: Pleasant, well-nourished, in no distress,  normal affect  ENT: No lesions,  mouth clear,  oropharynx clear, no postnasal drip  Neck: No JVD, no stridor  Lungs: No use of accessory muscles, no crackles or wheezing on normal respiration, no wheeze on forced expiration  Cardiovascular: RRR, heart sounds normal, no murmur or gallops, no peripheral edema  Musculoskeletal: No deformities, no cyanosis or clubbing  Neuro: alert, awake, non focal  Skin: Warm, no lesions or rash      Assessment & Plan:   Pulmonary nodules Enlarging right lower  lobe nodule, stable but suspicious right upper lobe nodule, right hilar and mediastinal adenopathy.  Suspicious for primary lung cancer.  Planning for bronchoscopy to get a tissue diagnosis.  Reviewed the risk, benefits, rationale with him and he agrees to proceed.  We reviewed your CT scan of the chest today We will work on arranging bronchoscopy to evaluate pulmonary nodules and enlarged lymph nodes.  This will be done under general anesthesia as an outpatient at [facility] endoscopy.  You will  need a designated driver.  You will need to stop your Eliquis 2 days prior.  We will try to get this scheduled for 02/23/2022 or 03/01/2022. Follow Dr. Lamonte Sakai in 1 month or next available  COPD, mild [facility]) Not currently on scheduled bronchodilator therapy.  Allergic rhinitis Fluticasone nasal spray   Baltazar Apo, MD, PhD 02/17/2022, 11:08 AM [facility] [phone] or if no answer before 7:00PM call [phone] For any issues after 7:00PM please call eLink [phone]

## 2022-02-17 NOTE — Assessment & Plan Note (Signed)
Not currently on scheduled bronchodilator therapy.

## 2022-02-17 NOTE — Addendum Note (Signed)
Addended by: Gavin Potters R on: 02/17/2022 12:02 PM   Modules accepted: Orders

## 2022-02-17 NOTE — Assessment & Plan Note (Signed)
Fluticasone nasal spray

## 2022-02-18 DIAGNOSIS — E785 Hyperlipidemia, unspecified: Secondary | ICD-10-CM | POA: Diagnosis not present

## 2022-02-18 DIAGNOSIS — Z125 Encounter for screening for malignant neoplasm of prostate: Secondary | ICD-10-CM | POA: Diagnosis not present

## 2022-02-18 DIAGNOSIS — R69 Illness, unspecified: Secondary | ICD-10-CM | POA: Diagnosis not present

## 2022-02-18 DIAGNOSIS — I7162 Paravisceral aneurysm of the thoracoabdominal aorta, without rupture: Secondary | ICD-10-CM | POA: Diagnosis not present

## 2022-02-18 DIAGNOSIS — Z95828 Presence of other vascular implants and grafts: Secondary | ICD-10-CM | POA: Diagnosis not present

## 2022-02-18 DIAGNOSIS — I716 Thoracoabdominal aortic aneurysm, without rupture, unspecified: Secondary | ICD-10-CM | POA: Diagnosis not present

## 2022-02-18 DIAGNOSIS — I10 Essential (primary) hypertension: Secondary | ICD-10-CM | POA: Diagnosis not present

## 2022-02-18 DIAGNOSIS — Z9889 Other specified postprocedural states: Secondary | ICD-10-CM | POA: Diagnosis not present

## 2022-02-19 ENCOUNTER — Telehealth: Payer: Self-pay | Admitting: Emergency Medicine

## 2022-02-19 ENCOUNTER — Other Ambulatory Visit: Payer: Self-pay | Admitting: Emergency Medicine

## 2022-02-19 LAB — SARS CORONAVIRUS 2 (TAT 6-24 HRS): SARS Coronavirus 2: NEGATIVE

## 2022-02-19 NOTE — Progress Notes (Signed)
Anesthesia Chart Review: Same day workup  Pertinent history includes former smoker (50+ pack years), COPD, paroxysmal atrial flutter, hypertension, thoracoabdominal aneurysm (FEVAR 01/2019 at [facility], type II endoleak being followed).  Most recent CT chest abdomen pelvis 02/18/2022 at [facility] showed continued decrease size of superior and inferior aneurysm sac.  Results below.  Nuclear stress 01/2021 showed no evidence of ischemia, echo 02/18/2021 showed EF 60 to 65%, no significant valvular abnormalities.  Follows with pulmonology for history of mild COPD and pulmonary nodules.  Recent imaging showed enlarging right lower lobe nodule, stable but suspicious right upper lobe nodule, right hilar and mediastinal adenopathy.  This was felt suspicious for primary lung cancer and bronchoscopy was recommended.  Per Dr. Agustina Caroli note 02/17/2022, patient will be instructed to stop Eliquis 2 days prior.  Patient will need day of surgery labs and evaluation.  EKG 03/13/2021: NSR.  Rate 67.  Right bundle branch block.  CT chest abdomen pelvis 02/18/2022 (Care Everywhere): Vascular:  -Redemonstrated fenestrated aortobiiliac endograft repair. Decreased size of the superior aneurysm sac measuring up to 3.9 cm, previously 4.1 cm. Decreased size of the inferior aneurysm sac measuring up to 4.3 cm, previously 4.5 cm.  -No evidence of endoleak.  -High-grade atherosclerotic narrowing of the left external iliac artery which is otherwise patent.  -Unchanged tortuosity of the mid right subclavian artery resulting in moderate narrowing.   Nonvascular:  -Continued increase size of a right lower lobe pulmonary nodule from 0.8 cm to 2.6 cm. In addition, there are new enlarged centrally necrotic mediastinal and right hilar lymph nodes. These findings are highly worrisome for primary lung malignancy with nodal metastasis. MTOP referral is again recommended.    -Additional bilateral subcentimeter pulmonary nodules are stable in size.   -Additional chronic and incidental findings as described.   CT super D chest 02/08/2022: IMPRESSION: 1. Spiculated nodules in the right lower lobe with right hilar and subcarinal adenopathy, findings indicative of T1c N2 M0 or stage IIIA bronchogenic carcinoma. 2. Aortic atherosclerosis (ICD10-I70.0). Coronary artery calcification. 3.  Emphysema (ICD10-J43.9).  TTE 02/23/2021:  1. Left ventricular ejection fraction, by estimation, is 60 to 65%. The  left ventricle has normal function. The left ventricle has no regional  wall motion abnormalities. There is mild left ventricular hypertrophy.  Left ventricular diastolic parameters  were normal.   2. Right ventricular systolic function is normal. The right ventricular  size is normal. There is normal pulmonary artery systolic pressure.   3. The mitral valve is normal in structure. No evidence of mitral valve  regurgitation. No evidence of mitral stenosis.   4. The aortic valve is normal in structure. Aortic valve regurgitation is  not visualized. No aortic stenosis is present.   5. There is mild dilatation of the ascending aorta, measuring 39 mm.   6. The inferior vena cava is normal in size with greater than 50%  respiratory variability, suggesting right atrial pressure of 3 mmHg.   Nuclear stress 02/04/2021: The left ventricular ejection fraction is hyperdynamic (>65%). Nuclear stress EF: 63%. There was no ST segment deviation noted during stress. Defect 1: There is a small defect of mild severity present in the apical lateral location. This is a low risk study. No ischemia or MI noted Fixed defect represents diaphragmatic attenuation.   Wynonia Musty [facility] Short Stay [facility]/Anesthesiology Phone [phone] 02/19/2022 9:40 AM

## 2022-02-19 NOTE — Anesthesia Preprocedure Evaluation (Signed)
Anesthesia Evaluation  Patient identified by MRN, date of birth, ID band Patient awake    Reviewed: Allergy & Precautions, NPO status , Patient's Chart, lab work & pertinent test results  Airway Mallampati: II  TM Distance: >3 FB Neck ROM: Full    Dental no notable dental hx.    Pulmonary COPD,  COPD inhaler, former smoker,  pulm nodules    + decreased breath sounds      Cardiovascular hypertension, + Peripheral Vascular Disease (FEVAR 07/20)  + dysrhythmias Atrial Fibrillation  Rhythm:Regular Rate:Normal     Neuro/Psych Anxiety negative neurological ROS     GI/Hepatic Neg liver ROS, GERD  ,  Endo/Other  negative endocrine ROS  Renal/GU   negative genitourinary   Musculoskeletal  (+) Arthritis , Osteoarthritis,    Abdominal Normal abdominal exam  (+)   Peds  Hematology negative hematology ROS (+)   Anesthesia Other Findings   Reproductive/Obstetrics                            Anesthesia Physical Anesthesia Plan  ASA: 3  Anesthesia Plan: General   Post-op Pain Management:    Induction:   PONV Risk Score and Plan: Ondansetron, Dexamethasone and Treatment may vary due to age or medical condition  Airway Management Planned: Mask and Oral ETT  Additional Equipment: None  Intra-op Plan:   Post-operative Plan: Extubation in OR  Informed Consent: I have reviewed the patients History and Physical, chart, labs and discussed the procedure including the risks, benefits and alternatives for the proposed anesthesia with the patient or authorized representative who has indicated his/her understanding and acceptance.     Dental advisory given  Plan Discussed with: CRNA  Anesthesia Plan Comments: (PAT note by Karoline Caldwell, PA-C: Pertinent history includes former smoker (50+ pack years), COPD, paroxysmal atrial flutter, hypertension, thoracoabdominal aneurysm (FEVAR 01/2019 at [facility], type II  endoleak being followed). Most recent CT chest abdomen pelvis 02/18/2022 at [facility] showed continued decrease size of superior and inferior aneurysm sac.  Results below.  Nuclear stress 01/2021 showed no evidence of ischemia, echo 02/18/2021 showed EF 60 to 65%, no significant valvular abnormalities.  Follows with pulmonology for history of mild COPD and pulmonary nodules.  Recent imaging showed enlarging right lower lobe nodule, stable but suspicious right upper lobe nodule, right hilar and mediastinal adenopathy.  This was felt suspicious for primary lung cancer and bronchoscopy was recommended.  Per Dr. Agustina Caroli note 02/17/2022, patient will be instructed to stop Eliquis 2 days prior.  Patient will need day of surgery labs and evaluation.  EKG 03/13/2021: NSR.  Rate 67.  Right bundle branch block.  CT chest abdomen pelvis 02/18/2022 (Care Everywhere): Vascular:  -Redemonstrated fenestrated aortobiiliac endograft repair. Decreased size of the superior aneurysm sac measuring up to 3.9 cm, previously 4.1 cm. Decreased size of the inferior aneurysm sac measuring up to 4.3 cm, previously 4.5 cm.  -No evidence of endoleak.  -High-grade atherosclerotic narrowing of the left external iliac artery which is otherwise patent.  -Unchanged tortuosity of the mid right subclavian artery resulting in moderate narrowing.   Nonvascular:  -Continued increase size of a right lower lobe pulmonary nodule from 0.8 cm to 2.6 cm. In addition, there are new enlarged centrally necrotic mediastinal and right hilar lymph nodes. These findings are highly worrisome for primary lung malignancy with nodal metastasis. MTOP referral is again recommended.   -Additional bilateral subcentimeter pulmonary nodules are stable in size.  -  Additional chronic and incidental findings as described.   CT super D chest 02/08/2022: IMPRESSION: 1. Spiculated nodules in the right lower lobe with right hilar and subcarinal adenopathy, findings  indicative of T1c N2 M0 or stage IIIA bronchogenic carcinoma. 2. Aortic atherosclerosis (ICD10-I70.0). Coronary artery calcification. 3. Emphysema (ICD10-J43.9).  TTE 02/23/2021: 1. Left ventricular ejection fraction, by estimation, is 60 to 65%. The  left ventricle has normal function. The left ventricle has no regional  wall motion abnormalities. There is mild left ventricular hypertrophy.  Left ventricular diastolic parameters  were normal.  2. Right ventricular systolic function is normal. The right ventricular  size is normal. There is normal pulmonary artery systolic pressure.  3. The mitral valve is normal in structure. No evidence of mitral valve  regurgitation. No evidence of mitral stenosis.  4. The aortic valve is normal in structure. Aortic valve regurgitation is  not visualized. No aortic stenosis is present.  5. There is mild dilatation of the ascending aorta, measuring 39 mm.  6. The inferior vena cava is normal in size with greater than 50%  respiratory variability, suggesting right atrial pressure of 3 mmHg.   Nuclear stress 02/04/2021: . The left ventricular ejection fraction is hyperdynamic (>65%). . Nuclear stress EF: 63%. . There was no ST segment deviation noted during stress. . Defect 1: There is a small defect of mild severity present in the apical lateral location. . This is a low risk study. . No ischemia or MI noted . Fixed defect represents diaphragmatic attenuation. )       Anesthesia Quick Evaluation

## 2022-02-19 NOTE — Telephone Encounter (Signed)
Spoke with pt who was more concerned about the amount of pain vs when he could return to work. Pt advised that there should be no pain just discomfort at the very worse. Pt stated understanding. Nothing further needed at this time.

## 2022-02-22 ENCOUNTER — Other Ambulatory Visit: Payer: Self-pay

## 2022-02-22 ENCOUNTER — Encounter (HOSPITAL_COMMUNITY): Payer: Self-pay | Admitting: Emergency Medicine

## 2022-02-22 NOTE — Progress Notes (Signed)
Patient was called and informed that his surgery time tomorrow was changed from 11:15 to 09:30 o'clock. Patient was informed that he must be at [facility] at 07:00 o'clock. Patient was upset because he was called multiple times and the surgery hours were changed and he hang up the phone.

## 2022-02-22 NOTE — Progress Notes (Signed)
PCP - [name], DO Cardiac - Dr Jenne Campus  CT Chest x-ray - 02/18/22 CE EKG - 03/13/21 Stress Test - 02/04/21 ECHO TEE - 02/23/21 Cardiac Cath - n/a  ICD Pacemaker/Loop - n/a  Sleep Study -  n/a CPAP - none  Blood Thinner Instructions:  Last dose of Eliquis was on 02/19/22.  Anesthesia review: Yes  STOP now taking any Aspirin (unless otherwise instructed by your surgeon), Aleve, Naproxen, Ibuprofen, Motrin, Advil, Goody's, BC's, all herbal medications, fish oil, and all vitamins.   Coronavirus Screening Do you have any of the following symptoms:  Cough yes/no: No Fever (>100.73F)  yes/no: No Runny nose yes/no: No Sore throat yes/no: No Difficulty breathing/shortness of breath  yes/no: No  Have you traveled in the last 14 days and where? yes/no: No  Patient verbalized understanding of instructions that were given via phone.

## 2022-02-23 ENCOUNTER — Ambulatory Visit (HOSPITAL_COMMUNITY): Payer: Medicare HMO

## 2022-02-23 ENCOUNTER — Encounter (HOSPITAL_COMMUNITY)
Admission: RE | Disposition: A | Discharge status: Home / Self Care | Payer: Self-pay | Source: Home / Self Care | Attending: Emergency Medicine

## 2022-02-23 ENCOUNTER — Encounter (HOSPITAL_COMMUNITY): Payer: Self-pay | Admitting: Emergency Medicine

## 2022-02-23 ENCOUNTER — Ambulatory Visit (HOSPITAL_COMMUNITY): Payer: Medicare HMO | Admitting: Physician Assistant

## 2022-02-23 ENCOUNTER — Ambulatory Visit (HOSPITAL_BASED_OUTPATIENT_CLINIC_OR_DEPARTMENT_OTHER): Payer: Medicare HMO | Admitting: Physician Assistant

## 2022-02-23 ENCOUNTER — Ambulatory Visit (HOSPITAL_COMMUNITY)
Admission: RE | Admit: 2022-02-23 | Discharge: 2022-02-23 | Disposition: A | Discharge status: Home / Self Care | Payer: Medicare HMO | Attending: Emergency Medicine | Admitting: Emergency Medicine

## 2022-02-23 DIAGNOSIS — C3431 Malignant neoplasm of lower lobe, right bronchus or lung: Secondary | ICD-10-CM | POA: Insufficient documentation

## 2022-02-23 DIAGNOSIS — Z87891 Personal history of nicotine dependence: Secondary | ICD-10-CM | POA: Insufficient documentation

## 2022-02-23 DIAGNOSIS — C771 Secondary and unspecified malignant neoplasm of intrathoracic lymph nodes: Secondary | ICD-10-CM | POA: Insufficient documentation

## 2022-02-23 DIAGNOSIS — I10 Essential (primary) hypertension: Secondary | ICD-10-CM

## 2022-02-23 DIAGNOSIS — I700 Atherosclerosis of aorta: Secondary | ICD-10-CM | POA: Diagnosis not present

## 2022-02-23 DIAGNOSIS — R918 Other nonspecific abnormal finding of lung field: Secondary | ICD-10-CM

## 2022-02-23 DIAGNOSIS — J439 Emphysema, unspecified: Secondary | ICD-10-CM | POA: Insufficient documentation

## 2022-02-23 DIAGNOSIS — I4892 Unspecified atrial flutter: Secondary | ICD-10-CM | POA: Insufficient documentation

## 2022-02-23 DIAGNOSIS — J449 Chronic obstructive pulmonary disease, unspecified: Secondary | ICD-10-CM

## 2022-02-23 DIAGNOSIS — R911 Solitary pulmonary nodule: Secondary | ICD-10-CM | POA: Diagnosis not present

## 2022-02-23 DIAGNOSIS — C3411 Malignant neoplasm of upper lobe, right bronchus or lung: Secondary | ICD-10-CM | POA: Diagnosis not present

## 2022-02-23 DIAGNOSIS — R590 Localized enlarged lymph nodes: Secondary | ICD-10-CM

## 2022-02-23 DIAGNOSIS — C801 Malignant (primary) neoplasm, unspecified: Secondary | ICD-10-CM | POA: Diagnosis not present

## 2022-02-23 HISTORY — PX: VIDEO BRONCHOSCOPY WITH RADIAL ENDOBRONCHIAL ULTRASOUND: SHX6849

## 2022-02-23 HISTORY — PX: BRONCHIAL NEEDLE ASPIRATION BIOPSY: SHX5106

## 2022-02-23 HISTORY — PX: BRONCHIAL BRUSHINGS: SHX5108

## 2022-02-23 HISTORY — PX: VIDEO BRONCHOSCOPY WITH ENDOBRONCHIAL ULTRASOUND: SHX6177

## 2022-02-23 HISTORY — PX: BRONCHIAL BIOPSY: SHX5109

## 2022-02-23 HISTORY — DX: Localized enlarged lymph nodes: R59.0

## 2022-02-23 LAB — CBC
HCT: 46.1 % (ref 39.0–52.0)
Hemoglobin: 15.7 g/dL (ref 13.0–17.0)
MCH: 31 pg (ref 26.0–34.0)
MCHC: 34.1 g/dL (ref 30.0–36.0)
MCV: 91.1 fL (ref 80.0–100.0)
Platelets: 195 10*3/uL (ref 150–400)
RBC: 5.06 MIL/uL (ref 4.22–5.81)
RDW: 13.4 % (ref 11.5–15.5)
WBC: 9.4 10*3/uL (ref 4.0–10.5)
nRBC: 0 % (ref 0.0–0.2)

## 2022-02-23 LAB — BASIC METABOLIC PANEL
Anion gap: 8 (ref 5–15)
BUN: 14 mg/dL (ref 8–23)
CO2: 23 mmol/L (ref 22–32)
Calcium: 8.9 mg/dL (ref 8.9–10.3)
Chloride: 105 mmol/L (ref 98–111)
Creatinine, Ser: 0.94 mg/dL (ref 0.61–1.24)
GFR, Estimated: >60 mL/min (ref >60)
Glucose, Bld: 97 mg/dL (ref 70–99)
Potassium: 4.6 mmol/L (ref 3.5–5.1)
Sodium: 136 mmol/L (ref 135–145)

## 2022-02-23 SURGERY — BRONCHOSCOPY, WITH BIOPSY USING ELECTROMAGNETIC NAVIGATION
Anesthesia: General | Laterality: Right

## 2022-02-23 MED ORDER — ROCURONIUM BROMIDE 10 MG/ML (PF) SYRINGE
PREFILLED_SYRINGE | INTRAVENOUS | Status: DC | PRN
  Administered 2022-02-23: 60 mg via INTRAVENOUS
  Administered 2022-02-23: 20 mg via INTRAVENOUS

## 2022-02-23 MED ORDER — CHLORHEXIDINE GLUCONATE 0.12 % MT SOLN
OROMUCOSAL | Status: AC
  Filled 2022-02-23: qty 15

## 2022-02-23 MED ORDER — SUGAMMADEX SODIUM 200 MG/2ML IV SOLN
INTRAVENOUS | Status: DC | PRN
  Administered 2022-02-23: 200 mg via INTRAVENOUS

## 2022-02-23 MED ORDER — EPHEDRINE SULFATE-NACL 50-0.9 MG/10ML-% IV SOSY
PREFILLED_SYRINGE | INTRAVENOUS | Status: DC | PRN
  Administered 2022-02-23: 2.5 mg via INTRAVENOUS

## 2022-02-23 MED ORDER — LACTATED RINGERS IV SOLN: INTRAVENOUS | Status: DC

## 2022-02-23 MED ORDER — PHENYLEPHRINE 80 MCG/ML (10ML) SYRINGE FOR IV PUSH (FOR BLOOD PRESSURE SUPPORT)
PREFILLED_SYRINGE | INTRAVENOUS | Status: DC | PRN
  Administered 2022-02-23: 240 ug via INTRAVENOUS

## 2022-02-23 MED ORDER — LIDOCAINE 2% (20 MG/ML) 5 ML SYRINGE
INTRAMUSCULAR | Status: DC | PRN
  Administered 2022-02-23: 60 mg via INTRAVENOUS

## 2022-02-23 MED ORDER — PHENYLEPHRINE HCL-NACL 20-0.9 MG/250ML-% IV SOLN
INTRAVENOUS | Status: DC | PRN
  Administered 2022-02-23: 40 ug/min via INTRAVENOUS

## 2022-02-23 MED ORDER — FENTANYL CITRATE (PF) 250 MCG/5ML IJ SOLN
INTRAMUSCULAR | Status: DC | PRN
  Administered 2022-02-23: 100 ug via INTRAVENOUS

## 2022-02-23 MED ORDER — CHLORHEXIDINE GLUCONATE 0.12 % MT SOLN
15.0000 mL | Freq: Once | OROMUCOSAL | Status: AC
Start: 2022-02-23 — End: 2022-02-23
  Administered 2022-02-23: 15 mL via OROMUCOSAL

## 2022-02-23 MED ORDER — APIXABAN 5 MG PO TABS: 5.0000 mg | ORAL_TABLET | Freq: Two times a day (BID) | ORAL | 3 refills | Status: DC

## 2022-02-23 MED ORDER — DEXAMETHASONE SODIUM PHOSPHATE 10 MG/ML IJ SOLN
INTRAMUSCULAR | Status: DC | PRN
  Administered 2022-02-23: 10 mg via INTRAVENOUS

## 2022-02-23 MED ORDER — PROPOFOL 10 MG/ML IV BOLUS
INTRAVENOUS | Status: DC | PRN
  Administered 2022-02-23: 50 mg via INTRAVENOUS
  Administered 2022-02-23: 150 mg via INTRAVENOUS

## 2022-02-23 MED ORDER — ONDANSETRON HCL 4 MG/2ML IJ SOLN
INTRAMUSCULAR | Status: DC | PRN
  Administered 2022-02-23: 4 mg via INTRAVENOUS

## 2022-02-23 NOTE — Interval H&P Note (Signed)
History and Physical Interval Note:  02/23/2022 7:41 AM  Adam Jordan  has presented today for surgery, with the diagnosis of RIGHT UPPER LOBE AND RIGHT LOWER LOBE NODULE  MEDIASTINAL ADENOPATHY.  The various methods of treatment have been discussed with the patient and family. After consideration of risks, benefits and other options for treatment, the patient has consented to  Procedure(s): ROBOTIC ASSISTED NAVIGATIONAL BRONCHOSCOPY (Right) VIDEO BRONCHOSCOPY WITH ENDOBRONCHIAL ULTRASOUND (Right) as a surgical intervention.  The patient's history has been reviewed, patient examined, no change in status, stable for surgery.  I have reviewed the patient's chart and labs.  Questions were answered to the patient's satisfaction.     Collene Gobble

## 2022-02-23 NOTE — Transfer of Care (Signed)
Immediate Anesthesia Transfer of Care Note  Patient: Adam Jordan  Procedure(s) Performed: ROBOTIC ASSISTED NAVIGATIONAL BRONCHOSCOPY (Right) VIDEO BRONCHOSCOPY WITH ENDOBRONCHIAL ULTRASOUND (Right) VIDEO BRONCHOSCOPY WITH RADIAL ENDOBRONCHIAL ULTRASOUND BRONCHIAL BRUSHINGS BRONCHIAL NEEDLE ASPIRATION BIOPSIES BRONCHIAL BIOPSIES  Patient Location: PACU  Anesthesia Type:General  Level of Consciousness: awake  Airway & Oxygen Therapy: Patient Spontanous Breathing  Post-op Assessment: Report given to RN and Post -op Vital signs reviewed and stable  Post vital signs: Reviewed and stable  Last Vitals:  Vitals Value Taken Time  BP 119/79   Temp    Pulse 57 02/23/22 1128  Resp 20 02/23/22 1128  SpO2 92 % 02/23/22 1128  Vitals shown include unvalidated device data.  Last Pain:  Vitals:   02/23/22 0735  PainSc: 0-No pain         Complications: No notable events documented.

## 2022-02-23 NOTE — Anesthesia Procedure Notes (Signed)
Procedure Name: Intubation Date/Time: 02/23/2022 9:47 AM  Performed by: Lorie Phenix, CRNAPre-anesthesia Checklist: Patient identified, Emergency Drugs available, Suction available and Patient being monitored Patient Re-evaluated:Patient Re-evaluated prior to induction Oxygen Delivery Method: Circle system utilized Preoxygenation: Pre-oxygenation with 100% oxygen Induction Type: IV induction Ventilation: Mask ventilation without difficulty Laryngoscope Size: Mac and 4 Grade View: Grade III Tube type: Oral Tube size: 8.5 mm Number of attempts: 1 Airway Equipment and Method: Stylet Placement Confirmation: ETT inserted through vocal cords under direct vision, positive ETCO2 and breath sounds checked- equal and bilateral Secured at: 24 cm Tube secured with: Tape Dental Injury: Teeth and Oropharynx as per pre-operative assessment

## 2022-02-23 NOTE — Discharge Instructions (Signed)
Flexible Bronchoscopy, Care After This sheet gives you information about how to care for yourself after your test. Your doctor may also give you more specific instructions. If you have problems or questions, contact your doctor. Follow these instructions at home: Eating and drinking Do not eat or drink anything (not even water) for 2 hours after your test, or until your numbing medicine (local anesthetic) wears off. When your numbness is gone and your cough and gag reflexes have come back, you may: Eat only soft foods. Slowly drink liquids. The day after the test, go back to your normal diet. Driving Do not drive for 24 hours if you were given a medicine to help you relax (sedative). Do not drive or use heavy machinery while taking prescription pain medicine. General instructions  Take over-the-counter and prescription medicines only as told by your doctor. Return to your normal activities as told. Ask what activities are safe for you. Do not use any products that have nicotine or tobacco in them. This includes cigarettes and e-cigarettes. If you need help quitting, ask your doctor. Keep all follow-up visits as told by your doctor. This is important. It is very important if you had a tissue sample (biopsy) taken. Get help right away if: You have shortness of breath that gets worse. You get light-headed. You feel like you are going to pass out (faint). You have chest pain. You cough up: More than a little blood. More blood than before. Summary Do not eat or drink anything (not even water) for 2 hours after your test, or until your numbing medicine wears off. Do not use cigarettes. Do not use e-cigarettes. Get help right away if you have chest pain.  Okay to restart your Eliquis on 02/24/2022  Please call our office for any questions or concerns.  [phone].  This information is not intended to replace advice given to you by your health care provider. Make sure you discuss any  questions you have with your health care provider. Document Released: 05/16/2009 Document Revised: 07/01/2017 Document Reviewed: 08/06/2016 Elsevier Patient Education  2020 [employer].

## 2022-02-23 NOTE — Anesthesia Postprocedure Evaluation (Signed)
Anesthesia Post Note  Patient: Adam Jordan  Procedure(s) Performed: ROBOTIC ASSISTED NAVIGATIONAL BRONCHOSCOPY (Right) VIDEO BRONCHOSCOPY WITH ENDOBRONCHIAL ULTRASOUND (Right) VIDEO BRONCHOSCOPY WITH RADIAL ENDOBRONCHIAL ULTRASOUND BRONCHIAL BRUSHINGS BRONCHIAL NEEDLE ASPIRATION BIOPSIES BRONCHIAL BIOPSIES     Patient location during evaluation: PACU Anesthesia Type: General Level of consciousness: awake and alert Pain management: pain level controlled Vital Signs Assessment: post-procedure vital signs reviewed and stable Respiratory status: spontaneous breathing, nonlabored ventilation, respiratory function stable and patient connected to nasal cannula oxygen Cardiovascular status: blood pressure returned to baseline and stable Postop Assessment: no apparent nausea or vomiting Anesthetic complications: no   No notable events documented.  Last Vitals:  Vitals:   02/23/22 1155 02/23/22 1210  BP: 104/60 96/60  Pulse: 61 60  Resp: 18 18  Temp:    SpO2: 95% 95%    Last Pain:  Vitals:   02/23/22 1210  PainSc: 0-No pain                 Belenda Cruise P Laiden Milles

## 2022-02-23 NOTE — Op Note (Signed)
Video Bronchoscopy with Robotic Assisted Bronchoscopic Navigation and  Endobronchial Ultrasound Procedure Note  Date of Operation: 02/23/2022   Pre-op Diagnosis: Right lower lobe pulmonary nodules, mediastinal adenopathy  Post-op Diagnosis: Same  Surgeon: Baltazar Apo  Assistants: None  Anesthesia: General endotracheal anesthesia  Operation: Flexible video fiberoptic bronchoscopy with robotic assistance and biopsies.  Estimated Blood Loss: Minimal  Complications: None  Indications and History: Adam Jordan is a 76 y.o. male with history of tobacco use, COPD.  He was noted to have right lower lobe pulmonary nodules and mediastinal adenopathy on CT chest performed to evaluate an aortic aneurysm.  Recommendation made to achieve a tissue diagnosis via robotic assisted navigational bronchoscopy and endobronchial ultrasound with biopsies. The risks, benefits, complications, treatment options and expected outcomes were discussed with the patient.  The possibilities of pneumothorax, pneumonia, reaction to medication, pulmonary aspiration, perforation of a viscus, bleeding, failure to diagnose a condition and creating a complication requiring transfusion or operation were discussed with the patient who freely signed the consent.    Description of Procedure: The patient was seen in the Preoperative Area, was examined and was deemed appropriate to proceed.  The patient was taken to [facility] endoscopy room 3, identified as Adam Jordan and the procedure verified as Flexible Video Fiberoptic Bronchoscopy.  A Time Out was held and the above information confirmed.   Prior to the date of the procedure a high-resolution CT scan of the chest was performed. Utilizing ION software program a virtual tracheobronchial tree was generated to allow the creation of distinct navigation pathways to the patient's parenchymal abnormalities. After being taken to the operating room general anesthesia was initiated and the  patient  was orally intubated. The video fiberoptic bronchoscope was introduced via the endotracheal tube and a general inspection was performed which showed normal right and left lung anatomy. Aspiration of the bilateral mainstems was completed to remove any remaining secretions. Robotic catheter inserted into patient's endotracheal tube.   Target #1 larger, oblong right lower lobe pulmonary nodule: The distinct navigation pathways prepared prior to this procedure were then utilized to navigate to patient's lesion identified on CT scan. The robotic catheter was secured into place and the vision probe was withdrawn.  Lesion location was approximated using fluoroscopy and radial endobronchial ultrasound for peripheral targeting.  Local registration and targeting were performed using [employer]ios three-dimensional imaging.  Under fluoroscopic guidance transbronchial needle brushings, transbronchial needle biopsies, and transbronchial forceps biopsies were performed to be sent for cytology and pathology.   Target #2 small adjacent right lower lobe pulmonary nodule Navigation to the smaller right lower lobe nodule was attempted but no good pathways to facilitate biopsies were achievable.  No samples were taken of the smaller nodule.  The robotic scope was then withdrawn and the endobronchial ultrasound was used to identify and characterize the peritracheal, hilar and bronchial lymph nodes. Inspection showed enlarged precarinal node, low station 4R.  There was also significant enlargement at station 7 and station 10 R. Using real-time ultrasound guidance Wang needle biopsies were take from Station 7 and 10 R nodes and were sent for cytology. The patient tolerated the procedure well without apparent complications. There was no significant blood loss. The bronchoscope was withdrawn. Anesthesia was reversed and the patient was taken to the PACU for recovery.   At the end of the procedure a general airway inspection was  performed and there was no evidence of active bleeding. The bronchoscope was removed.  The patient tolerated the procedure well.  There was no significant blood loss and there were no obvious complications. A post-procedural chest x-ray is pending.  Samples Target #1: 1. Transbronchial needle brushings from right lower lobe pulmonary nodule 2. Transbronchial Wang needle biopsies from right lower lobe pulmonary nodule 3. Transbronchial forceps biopsies from right lower lobe pulmonary nodule  EBUS samples: 1. Wang needle biopsies from 7 node 2. Wang needle biopsies from 10 R node  Plans:  The patient will be discharged from the PACU to home when recovered from anesthesia and after chest x-ray is reviewed. We will review the cytology, pathology and microbiology results with the patient when they become available. Outpatient followup will be with Dr Lamonte Sakai.    Baltazar Apo, MD, PhD 02/23/2022, 11:27 AM [facility] [phone] or if no answer before 7:00PM call [phone] For any issues after 7:00PM please call eLink [phone]

## 2022-02-24 ENCOUNTER — Other Ambulatory Visit: Payer: Self-pay | Admitting: Emergency Medicine

## 2022-02-24 ENCOUNTER — Encounter (HOSPITAL_COMMUNITY): Payer: Self-pay | Admitting: Emergency Medicine

## 2022-02-25 ENCOUNTER — Telehealth: Payer: Self-pay | Admitting: Emergency Medicine

## 2022-02-25 DIAGNOSIS — C3490 Malignant neoplasm of unspecified part of unspecified bronchus or lung: Secondary | ICD-10-CM

## 2022-02-25 LAB — CYTOLOGY - NON PAP

## 2022-02-25 NOTE — Telephone Encounter (Signed)
Discussed bronchoscopy results with the patient.  His transbronchial biopsies and nodal biopsies are consistent with small cell lung cancer.  I will make referral now to medical oncology.  He will need a PET scan and we will work on arranging this as well.

## 2022-03-01 ENCOUNTER — Telehealth: Payer: Self-pay | Admitting: Internal Medicine

## 2022-03-01 NOTE — Telephone Encounter (Signed)
Scheduled appt per 7/27 referral. Called pt, no answer. Left msg to have pt call me back to confirm appt date/time.

## 2022-03-02 ENCOUNTER — Other Ambulatory Visit: Payer: Self-pay | Admitting: Medical Oncology

## 2022-03-02 ENCOUNTER — Telehealth: Payer: Self-pay | Admitting: Internal Medicine

## 2022-03-02 DIAGNOSIS — R911 Solitary pulmonary nodule: Secondary | ICD-10-CM

## 2022-03-02 NOTE — Telephone Encounter (Signed)
Pt called in to confirm his appt with Dr. Julien Nordmann. Pt is aware of date/time.

## 2022-03-03 ENCOUNTER — Encounter (HOSPITAL_COMMUNITY)
Admission: RE | Admit: 2022-03-03 | Discharge: 2022-03-03 | Disposition: A | Discharge status: Home / Self Care | Payer: Medicare HMO | Source: Ambulatory Visit | Attending: Emergency Medicine | Admitting: Emergency Medicine

## 2022-03-03 DIAGNOSIS — C3490 Malignant neoplasm of unspecified part of unspecified bronchus or lung: Secondary | ICD-10-CM | POA: Diagnosis not present

## 2022-03-03 DIAGNOSIS — I2510 Atherosclerotic heart disease of native coronary artery without angina pectoris: Secondary | ICD-10-CM | POA: Diagnosis not present

## 2022-03-03 DIAGNOSIS — J432 Centrilobular emphysema: Secondary | ICD-10-CM | POA: Diagnosis not present

## 2022-03-03 DIAGNOSIS — C7951 Secondary malignant neoplasm of bone: Secondary | ICD-10-CM | POA: Diagnosis not present

## 2022-03-03 LAB — GLUCOSE, CAPILLARY: Glucose-Capillary: 111 mg/dL — ABNORMAL HIGH (ref 70–99)

## 2022-03-03 MED ORDER — FLUDEOXYGLUCOSE F - 18 (FDG) INJECTION
8.4000 | Freq: Once | INTRAVENOUS | Status: AC | PRN
  Administered 2022-03-03: 8.4 via INTRAVENOUS

## 2022-03-04 ENCOUNTER — Inpatient Hospital Stay: Payer: Medicare HMO | Attending: Internal Medicine | Admitting: Internal Medicine

## 2022-03-04 ENCOUNTER — Other Ambulatory Visit: Payer: Self-pay

## 2022-03-04 ENCOUNTER — Other Ambulatory Visit: Payer: Self-pay | Admitting: *Deleted

## 2022-03-04 ENCOUNTER — Inpatient Hospital Stay: Payer: Medicare HMO

## 2022-03-04 VITALS — BP 140/64 | HR 63 | Temp 97.5°F | Resp 16 | Ht 68.0 in | Wt 168.4 lb

## 2022-03-04 DIAGNOSIS — Z800 Family history of malignant neoplasm of digestive organs: Secondary | ICD-10-CM | POA: Diagnosis not present

## 2022-03-04 DIAGNOSIS — Z79899 Other long term (current) drug therapy: Secondary | ICD-10-CM | POA: Insufficient documentation

## 2022-03-04 DIAGNOSIS — R52 Pain, unspecified: Secondary | ICD-10-CM | POA: Diagnosis not present

## 2022-03-04 DIAGNOSIS — C3490 Malignant neoplasm of unspecified part of unspecified bronchus or lung: Secondary | ICD-10-CM

## 2022-03-04 DIAGNOSIS — Z5112 Encounter for antineoplastic immunotherapy: Secondary | ICD-10-CM

## 2022-03-04 DIAGNOSIS — Z803 Family history of malignant neoplasm of breast: Secondary | ICD-10-CM | POA: Diagnosis not present

## 2022-03-04 DIAGNOSIS — R634 Abnormal weight loss: Secondary | ICD-10-CM

## 2022-03-04 DIAGNOSIS — Z5111 Encounter for antineoplastic chemotherapy: Secondary | ICD-10-CM | POA: Insufficient documentation

## 2022-03-04 DIAGNOSIS — R0789 Other chest pain: Secondary | ICD-10-CM

## 2022-03-04 DIAGNOSIS — C771 Secondary and unspecified malignant neoplasm of intrathoracic lymph nodes: Secondary | ICD-10-CM | POA: Insufficient documentation

## 2022-03-04 DIAGNOSIS — Z8041 Family history of malignant neoplasm of ovary: Secondary | ICD-10-CM | POA: Diagnosis not present

## 2022-03-04 DIAGNOSIS — J449 Chronic obstructive pulmonary disease, unspecified: Secondary | ICD-10-CM | POA: Diagnosis not present

## 2022-03-04 DIAGNOSIS — I10 Essential (primary) hypertension: Secondary | ICD-10-CM | POA: Diagnosis not present

## 2022-03-04 DIAGNOSIS — Z87891 Personal history of nicotine dependence: Secondary | ICD-10-CM | POA: Insufficient documentation

## 2022-03-04 DIAGNOSIS — Z8042 Family history of malignant neoplasm of prostate: Secondary | ICD-10-CM | POA: Diagnosis not present

## 2022-03-04 DIAGNOSIS — R911 Solitary pulmonary nodule: Secondary | ICD-10-CM

## 2022-03-04 DIAGNOSIS — C7951 Secondary malignant neoplasm of bone: Secondary | ICD-10-CM | POA: Insufficient documentation

## 2022-03-04 DIAGNOSIS — C3431 Malignant neoplasm of lower lobe, right bronchus or lung: Secondary | ICD-10-CM | POA: Insufficient documentation

## 2022-03-04 HISTORY — DX: Malignant neoplasm of lower lobe, right bronchus or lung: C34.31

## 2022-03-04 HISTORY — DX: Encounter for antineoplastic chemotherapy: Z51.11

## 2022-03-04 HISTORY — DX: Encounter for antineoplastic immunotherapy: Z51.12

## 2022-03-04 LAB — CMP (CANCER CENTER ONLY)
ALT: 14 U/L (ref 0–44)
AST: 16 U/L (ref 15–41)
Albumin: 4.4 g/dL (ref 3.5–5.0)
Alkaline Phosphatase: 59 U/L (ref 38–126)
Anion gap: 5 (ref 5–15)
BUN: 17 mg/dL (ref 8–23)
CO2: 28 mmol/L (ref 22–32)
Calcium: 9.5 mg/dL (ref 8.9–10.3)
Chloride: 103 mmol/L (ref 98–111)
Creatinine: 0.98 mg/dL (ref 0.61–1.24)
GFR, Estimated: >60 mL/min (ref >60)
Glucose, Bld: 103 mg/dL — ABNORMAL HIGH (ref 70–99)
Potassium: 4.3 mmol/L (ref 3.5–5.1)
Sodium: 136 mmol/L (ref 135–145)
Total Bilirubin: 0.8 mg/dL (ref 0.3–1.2)
Total Protein: 7.2 g/dL (ref 6.5–8.1)

## 2022-03-04 LAB — CBC WITH DIFFERENTIAL (CANCER CENTER ONLY)
Abs Immature Granulocytes: 0.02 10*3/uL (ref 0.00–0.07)
Basophils Absolute: 0.1 10*3/uL (ref 0.0–0.1)
Basophils Relative: 1 %
Eosinophils Absolute: 0.2 10*3/uL (ref 0.0–0.5)
Eosinophils Relative: 2 %
HCT: 44.6 % (ref 39.0–52.0)
Hemoglobin: 15.3 g/dL (ref 13.0–17.0)
Immature Granulocytes: 0 %
Lymphocytes Relative: 22 %
Lymphs Abs: 2 10*3/uL (ref 0.7–4.0)
MCH: 31.1 pg (ref 26.0–34.0)
MCHC: 34.3 g/dL (ref 30.0–36.0)
MCV: 90.7 fL (ref 80.0–100.0)
Monocytes Absolute: 0.9 10*3/uL (ref 0.1–1.0)
Monocytes Relative: 10 %
Neutro Abs: 6 10*3/uL (ref 1.7–7.7)
Neutrophils Relative %: 65 %
Platelet Count: 197 10*3/uL (ref 150–400)
RBC: 4.92 MIL/uL (ref 4.22–5.81)
RDW: 13.2 % (ref 11.5–15.5)
WBC Count: 9.3 10*3/uL (ref 4.0–10.5)
nRBC: 0 % (ref 0.0–0.2)

## 2022-03-04 MED ORDER — LIDOCAINE-PRILOCAINE 2.5-2.5 % EX CREA: TOPICAL_CREAM | CUTANEOUS | 0 refills | Status: DC

## 2022-03-04 MED ORDER — PROCHLORPERAZINE MALEATE 10 MG PO TABS: 10.0000 mg | ORAL_TABLET | Freq: Four times a day (QID) | ORAL | 0 refills | Status: DC | PRN

## 2022-03-04 NOTE — Progress Notes (Signed)
[place] [phone]   [phone]  CONSULT NOTE  REFERRING PHYSICIAN: Dr. Baltazar Apo  REASON FOR CONSULTATION:  76 years old white male recently diagnosed with lung cancer.  HPI Adam Jordan is a 76 y.o. male with past medical history significant for anxiety, osteoarthritis, COPD, hypertension, dyslipidemia, paroxysmal atrial flutter as well as thoracoabdominal aneurysm and long history of smoking.  The patient was seen by his vascular surgeon for evaluation of the thoracoabdominal aneurysm and CT angiogram of the chest, abdomen and pelvis was performed on 09/03/2021 for surveillance and noticed to have some suspicious pulmonary nodule in the right lung measuring 0.9 cm with new patchy right lower lobe opacity.  The patient was followed by observation and repeat CT angiogram of the chest, abdomen and pelvis on 02/18/2022 showed continuous increase in the right lower lobe pulmonary nodule up to 2.6 cm with now right hilar and mediastinal lymphadenopathy.  The patient with patient was referred to Dr. Lamonte Sakai and CT super D of the chest on 02/08/2022 showed the spiculated right lower lobe nodule measuring 1.2 x 2.8 cm with right hilar and subcarinal lymphadenopathy.  On February 23, 2022 the patient underwent video bronchoscopy with robotic assisted bronchoscopic navigation and endobronchial ultrasound procedure by Dr. Lamonte Sakai and the final pathology [phone], [phone]) from the fine-needle aspiration and brushing of the right lower lobe lung nodule as well as the lymph nodes from level 10 R and 7 were consistent with small cell carcinoma.  The patient was referred to me today for evaluation and recommendation regarding treatment of his condition. He had a PET scan performed yesterday but the final report is still pending. When seen today he continues to complain of generalized pain especially in the knees bilaterally as well as the right shoulder and back.  He  also has intermittent chest tightness that lasts for few seconds and resolve spontaneously.  He also has shortness of breath with exertion and occasional cough with clear mucus and no hemoptysis.  He has a weight loss of around 40 pounds in the last 6 years especially after quitting alcohol drinking.  He denied having any headache or visual changes.  He has no nausea, vomiting, diarrhea or constipation. Family history significant for mother with breast and ovarian cancer.  Father had prostate cancer and died from heart disease.  He had a paternal grandmother who had colon cancer and brother died from seizure activity. The patient is married and has 1 biological son who lives in [place] and 2 stepchildren.  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  He currently works in Pharmacist, hospital.  He has a history of smoking 1 pack/day for around 50 years and quit recently.  He also has a history of alcohol abuse in the past but quit 6 years ago and no history of drug abuse.  He was accompanied today by his wife [name].  HPI  Past Medical History:  Diagnosis Date   Anxiety    Arthritis    osteoarthrititis- knees and most joints.   COPD (chronic obstructive pulmonary disease) (HCC)    moderate -no regular use of inhalers- rare use of oxygen as sexual activity   Dyspnea    outside in hot weather and also with pollen   Hyperlipidemia    Hypertension    Neuromuscular disorder ([place])    feet   Paroxysmal atrial flutter ([place]) 01/15/2021   Thoracoabdominal aneurysm ([place])  s/p FEVAR 4 Vessel TABME 02/19/20 Dr. Katy Apo    Past Surgical History:  Procedure Laterality Date   ABDOMINAL AORTIC ANEURYSM REPAIR  02/06/2020   BRONCHIAL BIOPSY  02/23/2022   Procedure: BRONCHIAL BIOPSIES;  Surgeon: Collene Gobble, MD;  Location: [facility] ENDOSCOPY;  Service: Pulmonary;;   BRONCHIAL BRUSHINGS  02/23/2022   Procedure: BRONCHIAL BRUSHINGS;  Surgeon: Collene Gobble, MD;  Location: [facility]  ENDOSCOPY;  Service: Pulmonary;;   BRONCHIAL NEEDLE ASPIRATION BIOPSY  02/23/2022   Procedure: BRONCHIAL NEEDLE ASPIRATION BIOPSIES;  Surgeon: Collene Gobble, MD;  Location: [place] [facility] ENDOSCOPY;  Service: Pulmonary;;   BROW LIFT Bilateral 12/11/2021   Procedure: UPPER LID BLEPHAROPLASTY;  Surgeon: Irene Limbo, MD;  Location: [facility];  Service: Plastics;  Laterality: Bilateral;   CYST REMOVAL LEG Left 07/03/2021   Procedure: EXCISION CYST LEFT BUTTOCK;  Surgeon: Jovita Kussmaul, MD;  Location: [facility];  Service: General;  Laterality: Left;   EYE SURGERY Bilateral    cataract surgery   FINGER ARTHROPLASTY Left    left thumb-Dr. Amedeo Plenty   INGUINAL HERNIA REPAIR Left 07/03/2021   Procedure: LEFT INGUINAL HERNIA REPAIR WITH MESH;  Surgeon: Jovita Kussmaul, MD;  Location: [facility];  Service: General;  Laterality: Left;   INSERTION OF MESH N/A 07/03/2021   Procedure: INSERTION OF MESH X2;  Surgeon: Jovita Kussmaul, MD;  Location: [facility];  Service: General;  Laterality: N/A;   IR RADIOLOGIST EVAL & MGMT  10/25/2019   KNEE ARTHROSCOPY Left    KNEE SURGERY Left    Bakers cyst x2   SHOULDER ARTHROSCOPY Right    thumb surgery     TONSILLECTOMY     TOTAL KNEE ARTHROPLASTY Left 03/19/2016   Procedure: LEFT TOTAL KNEE ARTHROPLASTY;  Surgeon: Sydnee Cabal, MD;  Location: [facility];  Service: Orthopedics;  Laterality: Left;   TOTAL SHOULDER REPLACEMENT Right    UMBILICAL HERNIA REPAIR N/A 07/03/2021   Procedure: UMBILICAL HERNIA REPAIR WITH MESH;  Surgeon: Jovita Kussmaul, MD;  Location: [facility];  Service: General;  Laterality: N/A;   VASCULAR SURGERY     VASECTOMY     VIDEO BRONCHOSCOPY WITH ENDOBRONCHIAL ULTRASOUND Right 02/23/2022   Procedure: VIDEO BRONCHOSCOPY WITH ENDOBRONCHIAL ULTRASOUND;  Surgeon: Collene Gobble, MD;  Location: [facility] ENDOSCOPY;  Service: Pulmonary;  Laterality: Right;   VIDEO BRONCHOSCOPY WITH RADIAL ENDOBRONCHIAL ULTRASOUND  02/23/2022   Procedure: VIDEO BRONCHOSCOPY WITH RADIAL  ENDOBRONCHIAL ULTRASOUND;  Surgeon: Collene Gobble, MD;  Location: [facility] ENDOSCOPY;  Service: Pulmonary;;    Family History  Problem Relation Age of Onset   Ovarian cancer Mother    Breast cancer Mother    Macular degeneration Mother    Heart attack Father     Social History Social History   Tobacco Use   Smoking status: Former    Years: 50.00    Types: Cigarettes    Quit date: 2020    Years since quitting: 3.5   Smokeless tobacco: Never   Tobacco comments:    Quit 2 months ago 02/17/22 [facility]   Vaping Use   Vaping Use: Never used  Substance Use Topics   Alcohol use: Not Currently   Drug use: No    Allergies  Allergen Reactions   Losartan Anaphylaxis   Atorvastatin Calcium     Causes agitation at 40 mg, tolerates 20 mg   Pollen Extract-Tree Extract [Pollen Extract]     Current Outpatient Medications  Medication Sig Dispense Refill   acetaminophen (TYLENOL)  500 MG tablet Take 500 mg by mouth daily as needed for moderate pain or headache.     Alum Hydroxide-Mag Trisilicate (GAVISCON) 73-22.0 MG CHEW Chew 1 tablet by mouth daily as needed (heartburn).     apixaban (ELIQUIS) 5 MG TABS tablet Take 1 tablet (5 mg total) by mouth 2 (two) times daily. Okay to restart this medication on 02/24/2022 180 tablet 3   atorvastatin (LIPITOR) 20 MG tablet Take 20 mg by mouth at bedtime.     clonazePAM (KLONOPIN) 1 MG tablet Take 1 tablet (1 mg total) by mouth 2 (two) times daily as needed for anxiety. 60 tablet 5   fluticasone (FLONASE) 50 MCG/ACT nasal spray Place 1 spray into both nostrils 2 (two) times daily as needed for allergies.     meloxicam (MOBIC) 15 MG tablet Take 15 mg by mouth daily as needed for pain.     Multiple Vitamin (MULTIVITAMIN WITH MINERALS) TABS tablet Take 1 tablet by mouth daily. 30 tablet 5   Naphazoline HCl (CLEAR EYES OP) Place 1 drop into both eyes daily.     OVER THE COUNTER MEDICATION Take 1-3 capsules by mouth at bedtime. Oxy-Powder colon cleanse otc  supplement/ Unknown strenght     PROAIR HFA 108 (90 BASE) MCG/ACT inhaler Inhale 2 puffs into the lungs every 6 (six) hours as needed for wheezing or shortness of breath.   2   sildenafil (REVATIO) 20 MG tablet Take 60 mg by mouth daily as needed (ED).     triamcinolone cream (KENALOG) 0.1 % Apply 1 Application topically daily.     No current facility-administered medications for this visit.    Review of Systems  Constitutional: positive for fatigue and weight loss Eyes: negative Ears, nose, mouth, throat, and face: negative Respiratory: positive for cough and dyspnea on exertion Cardiovascular: negative Gastrointestinal: negative Genitourinary:negative Integument/breast: negative Hematologic/lymphatic: negative Musculoskeletal:positive for arthralgias and back pain Neurological: negative Behavioral/Psych: negative Endocrine: negative Allergic/Immunologic: negative  Physical Exam  URK:YHCWC, healthy, no distress, well nourished, well developed, and anxious SKIN: skin color, texture, turgor are normal, no rashes or significant lesions HEAD: Normocephalic, No masses, lesions, tenderness or abnormalities EYES: normal, PERRLA, Conjunctiva are pink and non-injected EARS: External ears normal, Canals clear OROPHARYNX:no exudate, no erythema, and lips, buccal mucosa, and tongue normal  NECK: supple, no adenopathy, no JVD LYMPH:  no palpable lymphadenopathy, no hepatosplenomegaly LUNGS: clear to auscultation , and palpation HEART: regular rate & rhythm, no murmurs, and no gallops ABDOMEN:abdomen soft, non-tender, normal bowel sounds, and no masses or organomegaly BACK: Back symmetric, no curvature., No CVA tenderness EXTREMITIES:no joint deformities, effusion, or inflammation, no edema  NEURO: alert & oriented x 3 with fluent speech, no focal motor/sensory deficits  PERFORMANCE STATUS: ECOG 1  LABORATORY DATA: Lab Results  Component Value Date   WBC 9.3 03/04/2022   HGB 15.3  03/04/2022   HCT 44.6 03/04/2022   MCV 90.7 03/04/2022   PLT 197 03/04/2022      Chemistry      Component Value Date/Time   NA 136 02/23/2022 0835   NA 138 02/03/2021 0944   K 4.6 02/23/2022 0835   CL 105 02/23/2022 0835   CO2 23 02/23/2022 0835   BUN 14 02/23/2022 0835   BUN 13 02/03/2021 0944   CREATININE 0.94 02/23/2022 0835      Component Value Date/Time   CALCIUM 8.9 02/23/2022 0835   ALKPHOS 90 07/10/2015 1524   AST 75 (H) 07/10/2015 1524   ALT 65 (H)  07/10/2015 1524   BILITOT 1.0 07/10/2015 1524       RADIOGRAPHIC STUDIES: DG Chest Port 1 View  Result Date: 02/23/2022 CLINICAL DATA:  Status post bronchoscopy with biopsy EXAM: PORTABLE CHEST 1 VIEW COMPARISON:  CT 02/08/2022 FINDINGS: Airspace disease in the right lower lobe likely related to bronchoscopy. No pneumothorax. There is hyperinflation of the lungs compatible with COPD. No effusions. Heart is normal size. Aortic atherosclerosis. IMPRESSION: Right lower lobe airspace disease likely related to bronchoscopy. No pneumothorax. COPD. Electronically Signed   By: Rolm Baptise M.D.   On: 02/23/2022 11:54   DG C-ARM BRONCHOSCOPY  Result Date: 02/23/2022 C-ARM BRONCHOSCOPY: Fluoroscopy was utilized by the requesting physician.  No radiographic interpretation.   CT Super D Chest Wo Contrast  Result Date: 02/09/2022 CLINICAL DATA:  Pulmonary nodules. EXAM: CT CHEST WITHOUT CONTRAST TECHNIQUE: Multidetector CT imaging of the chest was performed using thin slice collimation for electromagnetic bronchoscopy planning purposes, without intravenous contrast. RADIATION DOSE REDUCTION: This exam was performed according to the departmental dose-optimization program which includes automated exposure control, adjustment of the mA and/or kV according to patient size and/or use of iterative reconstruction technique. COMPARISON:  11/29/2019. FINDINGS: Cardiovascular: Atherosclerotic calcification of the aorta, aortic valve and coronary  arteries. Heart size normal. No pericardial effusion. Mediastinum/Nodes: Low internal jugular lymph nodes measure up to 6 mm on the left. Subcarinal adenopathy measures 1.8 cm. Right hilar adenopathy is difficult to accurately measure with approximate measurement up to 2.1 cm. No axillary adenopathy. Esophagus is grossly unremarkable. Lungs/Pleura: Biapical pleuroparenchymal scarring. Centrilobular and paraseptal emphysema. Spiculated 8 mm nodule in the anterior right lower lobe (3/241) previously 5 mm on 11/29/2019. Additional nodules are seen slightly more inferiorly within the lateral right lower lobe, measuring up to 1.2 x 2.8 cm (3/267), new. 2 mm lateral left upper lobe nodule (3/138). 4 mm lateral left upper lobe nodule (3/207), stable. Tiny amount of associated ground-glass. Scarring in the right middle lobe and lingula. Additional scarring in the lower lobes. No pleural fluid. Airway is unremarkable. Upper Abdomen: Visualized portions of the liver, gallbladder, adrenal glands, kidneys, spleen pancreas, stomach and bowel are grossly unremarkable with the exception of a tiny hiatal hernia. Aortic endograft stenting partially imaged. Musculoskeletal: Degenerative changes in the spine. No worrisome lytic or sclerotic lesions. Right shoulder arthroplasty. IMPRESSION: 1. Spiculated nodules in the right lower lobe with right hilar and subcarinal adenopathy, findings indicative of T1c N2 M0 or stage IIIA bronchogenic carcinoma. 2. Aortic atherosclerosis (ICD10-I70.0). Coronary artery calcification. 3.  Emphysema (ICD10-J43.9). Electronically Signed   By: Lorin Picket M.D.   On: 02/09/2022 13:11    ASSESSMENT: This is a very pleasant 76 years old white male recently diagnosed with extensive stage (T1c, N2, M1C) small cell lung cancer presented with right lower lobe lung nodule in addition to right hilar and mediastinal lymphadenopathy and bone metastasis involving thoracic spines as well as the right iliac  crest diagnosed in July 2023.   PLAN: I had a lengthy discussion with the patient and his wife today about his current disease stage, prognosis and treatment options. I personally and independently reviewed the scan images and discussed the result and showed the images to the patient and his wife. I recommended for him to complete the staging work-up by ordering MRI of the brain to rule out brain metastasis. I explained to the patient that he has incurable condition and all the treatment will be of palliative nature. I discussed with the patient the option of  palliative care versus palliative treatment with systemic chemotherapy as well as the prognosis with and without the treatment. The patient is interested in proceeding with treatment. He will be treated with a regimen consisting of carboplatin for AUC of 5 on day 1 and etoposide 100 Mg/M2 on days 1, 2 and 3 with Imfinzi 1500 Mg IV on day 1 with Cosela 240 Mg/M2 on the days of the chemotherapy.  He is expected to start the first cycle of this treatment on 03/10/2022. I discussed with the patient the adverse effect of this treatment including but not limited to alopecia, myelosuppression, nausea and vomiting, peripheral neuropathy, liver or renal dysfunction as well as immunotherapy adverse effects. We will arrange for the patient to have a Port-A-Cath placed before starting the first cycle of his treatment. I will also arrange for the patient to have a chemotherapy education class before the first dose of his treatment. I will call his pharmacy with prescription for Compazine and Emla cream. He will come back for follow-up visit in 2 weeks for evaluation and management of any adverse effect of his treatment. The patient was advised to call immediately if he has any other concerning symptoms in the interval. The patient voices understanding of current disease status and treatment options and is in agreement with the current care plan.  All  questions were answered. The patient knows to call the clinic with any problems, questions or concerns. We can certainly see the patient much sooner if necessary.  Thank you so much for allowing me to participate in the care of Adam Jordan. I will continue to follow up the patient with you and assist in his care.  The total time spent in the appointment was 90 minutes.  Disclaimer: This note was dictated with voice recognition software. Similar sounding words can inadvertently be transcribed and may not be corrected upon review.   Eilleen Kempf March 04, 2022, 8:22 AM

## 2022-03-04 NOTE — Progress Notes (Signed)
START ON PATHWAY REGIMEN - Small Cell Lung     Cycles 1 through 4: A cycle is every 21 days:     Durvalumab      Carboplatin      Etoposide    Cycles 5 and beyond: A cycle is every 28 days:     Durvalumab   **Always confirm dose/schedule in your pharmacy ordering system**  Patient Characteristics: Newly Diagnosed, Preoperative or Nonsurgical Candidate (Clinical Staging), First Line, Extensive Stage Therapeutic Status: Newly Diagnosed, Preoperative or Nonsurgical Candidate (Clinical Staging) AJCC T Category: cT1c AJCC N Category: cN2 AJCC M Category: cM1c AJCC 8 Stage Grouping: IVB Stage Classification: Extensive  Intent of Therapy: Non-Curative / Palliative Intent, Discussed with Patient

## 2022-03-04 NOTE — Progress Notes (Signed)
The proposed treatment discussed in conference is for discussion purpose only and is not a binding recommendation.  The patients have not been physically examined, or presented with their treatment options.  Therefore, final treatment plans cannot be decided.  

## 2022-03-05 ENCOUNTER — Other Ambulatory Visit: Payer: Self-pay

## 2022-03-05 ENCOUNTER — Telehealth: Payer: Self-pay | Admitting: Internal Medicine

## 2022-03-05 NOTE — Telephone Encounter (Signed)
Scheduled per 08/03 los, patient has been called and notified of upcoming appointments.

## 2022-03-05 NOTE — Progress Notes (Signed)
Pharmacist Chemotherapy Monitoring - Initial Assessment    Anticipated start date: 03/10/22   The following has been reviewed per standard work regarding the patient's treatment regimen: The patient's diagnosis, treatment plan and drug doses, and organ/hematologic function Lab orders and baseline tests specific to treatment regimen  The treatment plan start date, drug sequencing, and pre-medications Prior authorization status  Patient's documented medication list, including drug-drug interaction screen and prescriptions for anti-emetics and supportive care specific to the treatment regimen The drug concentrations, fluid compatibility, administration routes, and timing of the medications to be used The patient's access for treatment and lifetime cumulative dose history, if applicable  The patient's medication allergies and previous infusion related reactions, if applicable   Changes made to treatment plan:  N/A  Follow up needed:  Pending authorization for treatment    Kennith Center, Pharm.D., CPP 03/05/2022@4 :21 PM

## 2022-03-06 ENCOUNTER — Other Ambulatory Visit: Payer: Self-pay

## 2022-03-09 MED FILL — Fosaprepitant Dimeglumine For IV Infusion 150 MG (Base Eq): INTRAVENOUS | Qty: 5 | Status: AC

## 2022-03-09 MED FILL — Dexamethasone Sodium Phosphate Inj 100 MG/10ML: INTRAMUSCULAR | Qty: 1 | Status: AC

## 2022-03-10 ENCOUNTER — Inpatient Hospital Stay: Payer: Medicare HMO

## 2022-03-10 ENCOUNTER — Other Ambulatory Visit: Payer: Self-pay

## 2022-03-10 ENCOUNTER — Other Ambulatory Visit (HOSPITAL_COMMUNITY): Payer: Medicare HMO

## 2022-03-10 VITALS — BP 152/71 | HR 66 | Temp 97.7°F | Resp 18 | Wt 168.8 lb

## 2022-03-10 DIAGNOSIS — C3431 Malignant neoplasm of lower lobe, right bronchus or lung: Secondary | ICD-10-CM | POA: Diagnosis not present

## 2022-03-10 DIAGNOSIS — Z5111 Encounter for antineoplastic chemotherapy: Secondary | ICD-10-CM | POA: Diagnosis not present

## 2022-03-10 DIAGNOSIS — Z8041 Family history of malignant neoplasm of ovary: Secondary | ICD-10-CM | POA: Diagnosis not present

## 2022-03-10 DIAGNOSIS — C7951 Secondary malignant neoplasm of bone: Secondary | ICD-10-CM | POA: Diagnosis not present

## 2022-03-10 DIAGNOSIS — Z79899 Other long term (current) drug therapy: Secondary | ICD-10-CM | POA: Diagnosis not present

## 2022-03-10 DIAGNOSIS — Z5112 Encounter for antineoplastic immunotherapy: Secondary | ICD-10-CM | POA: Diagnosis not present

## 2022-03-10 DIAGNOSIS — Z803 Family history of malignant neoplasm of breast: Secondary | ICD-10-CM | POA: Diagnosis not present

## 2022-03-10 DIAGNOSIS — Z8042 Family history of malignant neoplasm of prostate: Secondary | ICD-10-CM | POA: Diagnosis not present

## 2022-03-10 DIAGNOSIS — Z800 Family history of malignant neoplasm of digestive organs: Secondary | ICD-10-CM | POA: Diagnosis not present

## 2022-03-10 DIAGNOSIS — Z87891 Personal history of nicotine dependence: Secondary | ICD-10-CM | POA: Diagnosis not present

## 2022-03-10 DIAGNOSIS — C771 Secondary and unspecified malignant neoplasm of intrathoracic lymph nodes: Secondary | ICD-10-CM | POA: Diagnosis not present

## 2022-03-10 LAB — CBC WITH DIFFERENTIAL (CANCER CENTER ONLY)
Abs Immature Granulocytes: 0.02 10*3/uL (ref 0.00–0.07)
Basophils Absolute: 0.1 10*3/uL (ref 0.0–0.1)
Basophils Relative: 1 %
Eosinophils Absolute: 0.1 10*3/uL (ref 0.0–0.5)
Eosinophils Relative: 1 %
HCT: 46.3 % (ref 39.0–52.0)
Hemoglobin: 16 g/dL (ref 13.0–17.0)
Immature Granulocytes: 0 %
Lymphocytes Relative: 17 %
Lymphs Abs: 1.7 10*3/uL (ref 0.7–4.0)
MCH: 31.1 pg (ref 26.0–34.0)
MCHC: 34.6 g/dL (ref 30.0–36.0)
MCV: 90.1 fL (ref 80.0–100.0)
Monocytes Absolute: 0.6 10*3/uL (ref 0.1–1.0)
Monocytes Relative: 6 %
Neutro Abs: 7.3 10*3/uL (ref 1.7–7.7)
Neutrophils Relative %: 75 %
Platelet Count: 209 10*3/uL (ref 150–400)
RBC: 5.14 MIL/uL (ref 4.22–5.81)
RDW: 13.2 % (ref 11.5–15.5)
WBC Count: 9.9 10*3/uL (ref 4.0–10.5)
nRBC: 0 % (ref 0.0–0.2)

## 2022-03-10 LAB — CMP (CANCER CENTER ONLY)
ALT: 15 U/L (ref 0–44)
AST: 15 U/L (ref 15–41)
Albumin: 4.7 g/dL (ref 3.5–5.0)
Alkaline Phosphatase: 63 U/L (ref 38–126)
Anion gap: 7 (ref 5–15)
BUN: 15 mg/dL (ref 8–23)
CO2: 27 mmol/L (ref 22–32)
Calcium: 9.7 mg/dL (ref 8.9–10.3)
Chloride: 103 mmol/L (ref 98–111)
Creatinine: 0.88 mg/dL (ref 0.61–1.24)
GFR, Estimated: >60 mL/min (ref >60)
Glucose, Bld: 102 mg/dL — ABNORMAL HIGH (ref 70–99)
Potassium: 4.2 mmol/L (ref 3.5–5.1)
Sodium: 137 mmol/L (ref 135–145)
Total Bilirubin: 0.8 mg/dL (ref 0.3–1.2)
Total Protein: 7.8 g/dL (ref 6.5–8.1)

## 2022-03-10 LAB — TSH: TSH: 1.302 u[IU]/mL (ref 0.350–4.500)

## 2022-03-10 MED ORDER — SODIUM CHLORIDE 0.9 % IV SOLN
1500.0000 mg | Freq: Once | INTRAVENOUS | Status: AC
  Administered 2022-03-10: 1500 mg via INTRAVENOUS
  Filled 2022-03-10: qty 30

## 2022-03-10 MED ORDER — SODIUM CHLORIDE 0.9 % IV SOLN: Freq: Once | INTRAVENOUS | Status: AC

## 2022-03-10 MED ORDER — SODIUM CHLORIDE 0.9 % IV SOLN
475.0000 mg | Freq: Once | INTRAVENOUS | Status: AC
  Administered 2022-03-10: 480 mg via INTRAVENOUS
  Filled 2022-03-10: qty 48

## 2022-03-10 MED ORDER — SODIUM CHLORIDE 0.9 % IV SOLN
100.0000 mg/m2 | Freq: Once | INTRAVENOUS | Status: AC
  Administered 2022-03-10: 190 mg via INTRAVENOUS
  Filled 2022-03-10: qty 9.5

## 2022-03-10 MED ORDER — PALONOSETRON HCL INJECTION 0.25 MG/5ML
0.2500 mg | Freq: Once | INTRAVENOUS | Status: AC
  Administered 2022-03-10: 0.25 mg via INTRAVENOUS

## 2022-03-10 MED ORDER — SODIUM CHLORIDE 0.9 % IV SOLN
150.0000 mg | Freq: Once | INTRAVENOUS | Status: AC
  Administered 2022-03-10: 150 mg via INTRAVENOUS
  Filled 2022-03-10: qty 150

## 2022-03-10 MED ORDER — TRILACICLIB DIHYDROCHLORIDE INJECTION 300 MG
240.0000 mg/m2 | Freq: Once | INTRAVENOUS | Status: AC
  Administered 2022-03-10: 465 mg via INTRAVENOUS
  Filled 2022-03-10: qty 31

## 2022-03-10 MED ORDER — SODIUM CHLORIDE 0.9 % IV SOLN
10.0000 mg | Freq: Once | INTRAVENOUS | Status: AC
  Administered 2022-03-10: 10 mg via INTRAVENOUS
  Filled 2022-03-10: qty 10

## 2022-03-10 MED FILL — Dexamethasone Sodium Phosphate Inj 100 MG/10ML: INTRAMUSCULAR | Qty: 1 | Status: AC

## 2022-03-10 NOTE — Patient Instructions (Signed)
[facility]  Discharge Instructions: Thank you for choosing [facility] to provide your oncology and hematology care.   If you have a lab appointment with the [facility], please go directly to the [facility] and check in at the registration area.   Wear comfortable clothing and clothing appropriate for easy access to any Portacath or PICC line.   We strive to give you quality time with your provider. You may need to reschedule your appointment if you arrive late (15 or more minutes).  Arriving late affects you and other patients whose appointments are after yours.  Also, if you miss three or more appointments without notifying the office, you may be dismissed from the clinic at the provider's discretion.      For prescription refill requests, have your pharmacy contact our office and allow 72 hours for refills to be completed.    Today you received the following chemotherapy and/or immunotherapy agents: Durvalumab, Carboplatin, Etoposide.       To help prevent nausea and vomiting after your treatment, we encourage you to take your nausea medication as directed.  BELOW ARE SYMPTOMS THAT SHOULD BE REPORTED IMMEDIATELY: *FEVER GREATER THAN 100.4 F (38 C) OR HIGHER *CHILLS OR SWEATING *NAUSEA AND VOMITING THAT IS NOT CONTROLLED WITH YOUR NAUSEA MEDICATION *UNUSUAL SHORTNESS OF BREATH *UNUSUAL BRUISING OR BLEEDING *URINARY PROBLEMS (pain or burning when urinating, or frequent urination) *BOWEL PROBLEMS (unusual diarrhea, constipation, pain near the anus) TENDERNESS IN MOUTH AND THROAT WITH OR WITHOUT PRESENCE OF ULCERS (sore throat, sores in mouth, or a toothache) UNUSUAL RASH, SWELLING OR PAIN  UNUSUAL VAGINAL DISCHARGE OR ITCHING   Items with * indicate a potential emergency and should be followed up as soon as possible or go to the Emergency Department if any problems should occur.  Please show the CHEMOTHERAPY ALERT CARD or IMMUNOTHERAPY  ALERT CARD at check-in to the Emergency Department and triage nurse.  Should you have questions after your visit or need to cancel or reschedule your appointment, please contact [facility]  Dept: [phone]  and follow the prompts.  Office hours are 8:00 a.m. to 4:30 p.m. Monday - Friday. Please note that voicemails left after 4:00 p.m. may not be returned until the following business day.  We are closed weekends and major holidays. You have access to a nurse at all times for urgent questions. Please call the main number to the clinic Dept: [phone] and follow the prompts.   For any non-urgent questions, you may also contact your provider using MyChart. We now offer e-Visits for anyone 58 and older to request care online for non-urgent symptoms. For details visit [email or web address].   Also download the MyChart app! Go to the app store, search "MyChart", open the app, select [facility], and log in with your MyChart username and password.  Masks are optional in the cancer centers. If you would like for your care team to wear a mask while they are taking care of you, please let them know. You may have one support person who is at least 75 years old accompany you for your appointments. Durvalumab Injection What is this medication? DURVALUMAB (dur VAL ue mab) treats some types of cancer. It works by helping your immune system slow or stop the spread of cancer cells. It is a monoclonal antibody. This medicine may be used for other purposes; ask your health care provider or pharmacist if you have questions. COMMON BRAND NAME(S): IMFINZI  What should I tell my care team before I take this medication? They need to know if you have any of these conditions: Allogeneic stem cell transplant (uses someone else's stem cells) Autoimmune diseases, such as Crohn disease, ulcerative colitis, lupus History of chest radiation Nervous system problems, such as Guillain-Barre  syndrome, myasthenia gravis Organ transplant An unusual or allergic reaction to durvalumab, other medications, foods, dyes, or preservatives Pregnant or trying to get pregnant Breast-feeding How should I use this medication? This medication is infused into a vein. It is given by your care team in a hospital or clinic setting. A special MedGuide will be given to you before each treatment. Be sure to read this information carefully each time. Talk to your care team about the use of this medication in children. Special care may be needed. Overdosage: If you think you have taken too much of this medicine contact a poison control center or emergency room at once. NOTE: This medicine is only for you. Do not share this medicine with others. What if I miss a dose? Keep appointments for follow-up doses. It is important not to miss your dose. Call your care team if you are unable to keep an appointment. What may interact with this medication? Interactions have not been studied. This list may not describe all possible interactions. Give your health care provider a list of all the medicines, herbs, non-prescription drugs, or dietary supplements you use. Also tell them if you smoke, drink alcohol, or use illegal drugs. Some items may interact with your medicine. What should I watch for while using this medication? Your condition will be monitored carefully while you are receiving this medication. You may need blood work while taking this medication. This medication may cause serious skin reactions. They can happen weeks to months after starting the medication. Contact your care team right away if you notice fevers or flu-like symptoms with a rash. The rash may be red or purple and then turn into blisters or peeling of the skin. You may also notice a red rash with swelling of the face, lips, or lymph nodes in your neck or under your arms. Tell your care team right away if you have any change in your  eyesight. Talk to your care team if you may be pregnant. Serious birth defects can occur if you take this medication during pregnancy and for 3 months after the last dose. You will need a negative pregnancy test before starting this medication. Contraception is recommended while taking this medication and for 3 months after the last dose. Your care team can help you find the option that works for you. Do not breastfeed while taking this medication and for 3 months after the last dose. What side effects may I notice from receiving this medication? Side effects that you should report to your care team as soon as possible: Allergic reactions--skin rash, itching, hives, swelling of the face, lips, tongue, or throat Dry cough, shortness of breath or trouble breathing Eye pain, redness, irritation, or discharge with blurry or decreased vision Heart muscle inflammation--unusual weakness or fatigue, shortness of breath, chest pain, fast or irregular heartbeat, dizziness, swelling of the ankles, feet, or hands Hormone gland problems--headache, sensitivity to light, unusual weakness or fatigue, dizziness, fast or irregular heartbeat, increased sensitivity to cold or heat, excessive sweating, constipation, hair loss, increased thirst or amount of urine, tremors or shaking, irritability Infusion reactions--chest pain, shortness of breath or trouble breathing, feeling faint or lightheaded Kidney injury (glomerulonephritis)--decrease in the  amount of urine, red or dark brown urine, foamy or bubbly urine, swelling of the ankles, hands, or feet Liver injury--right upper belly pain, loss of appetite, nausea, light-colored stool, dark yellow or brown urine, yellowing skin or eyes, unusual weakness or fatigue Pain, tingling, or numbness in the hands or feet, muscle weakness, change in vision, confusion or trouble speaking, loss of balance or coordination, trouble walking, seizures Rash, fever, and swollen lymph  nodes Redness, blistering, peeling, or loosening of the skin, including inside the mouth Sudden or severe stomach pain, bloody diarrhea, fever, nausea, vomiting Side effects that usually do not require medical attention (report these to your care team if they continue or are bothersome): Bone, joint, or muscle pain Diarrhea Fatigue Loss of appetite Nausea Skin rash This list may not describe all possible side effects. Call your doctor for medical advice about side effects. You may report side effects to FDA at 1-800-FDA-1088. Where should I keep my medication? This medication is given in a hospital or clinic. It will not be stored at home. NOTE: This sheet is a summary. It may not cover all possible information. If you have questions about this medicine, talk to your doctor, pharmacist, or health care provider.  2023 Elsevier/Gold Standard (2021-01-07 00:00:00) Carboplatin Injection What is this medication? CARBOPLATIN (KAR boe pla tin) treats some types of cancer. It works by slowing down the growth of cancer cells. This medicine may be used for other purposes; ask your health care provider or pharmacist if you have questions. COMMON BRAND NAME(S): Paraplatin What should I tell my care team before I take this medication? They need to know if you have any of these conditions: Blood disorders Hearing problems Kidney disease Recent or ongoing radiation therapy An unusual or allergic reaction to carboplatin, cisplatin, other medications, foods, dyes, or preservatives Pregnant or trying to get pregnant Breast-feeding How should I use this medication? This medication is injected into a vein. It is given by your care team in a hospital or clinic setting. Talk to your care team about the use of this medication in children. Special care may be needed. Overdosage: If you think you have taken too much of this medicine contact a poison control center or emergency room at once. NOTE: This  medicine is only for you. Do not share this medicine with others. What if I miss a dose? Keep appointments for follow-up doses. It is important not to miss your dose. Call your care team if you are unable to keep an appointment. What may interact with this medication? Medications for seizures Some antibiotics, such as amikacin, gentamicin, neomycin, streptomycin, tobramycin Vaccines This list may not describe all possible interactions. Give your health care provider a list of all the medicines, herbs, non-prescription drugs, or dietary supplements you use. Also tell them if you smoke, drink alcohol, or use illegal drugs. Some items may interact with your medicine. What should I watch for while using this medication? Your condition will be monitored carefully while you are receiving this medication. You may need blood work while taking this medication. This medication may make you feel generally unwell. This is not uncommon, as chemotherapy can affect healthy cells as well as cancer cells. Report any side effects. Continue your course of treatment even though you feel ill unless your care team tells you to stop. In some cases, you may be given additional medications to help with side effects. Follow all directions for their use. This medication may increase your risk of getting  an infection. Call your care team for advice if you get a fever, chills, sore throat, or other symptoms of a cold or flu. Do not treat yourself. Try to avoid being around people who are sick. Avoid taking medications that contain aspirin, acetaminophen, ibuprofen, naproxen, or ketoprofen unless instructed by your care team. These medications may hide a fever. Be careful brushing or flossing your teeth or using a toothpick because you may get an infection or bleed more easily. If you have any dental work done, tell your dentist you are receiving this medication. Talk to your care team if you wish to become pregnant or think you  might be pregnant. This medication can cause serious birth defects. Talk to your care team about effective forms of contraception. Do not breast-feed while taking this medication. What side effects may I notice from receiving this medication? Side effects that you should report to your care team as soon as possible: Allergic reactions--skin rash, itching, hives, swelling of the face, lips, tongue, or throat Infection--fever, chills, cough, sore throat, wounds that don't heal, pain or trouble when passing urine, general feeling of discomfort or being unwell Low red blood cell level--unusual weakness or fatigue, dizziness, headache, trouble breathing Pain, tingling, or numbness in the hands or feet, muscle weakness, change in vision, confusion or trouble speaking, loss of balance or coordination, trouble walking, seizures Unusual bruising or bleeding Side effects that usually do not require medical attention (report to your care team if they continue or are bothersome): Hair loss Nausea Unusual weakness or fatigue Vomiting This list may not describe all possible side effects. Call your doctor for medical advice about side effects. You may report side effects to FDA at 1-800-FDA-1088. Where should I keep my medication? This medication is given in a hospital or clinic. It will not be stored at home. NOTE: This sheet is a summary. It may not cover all possible information. If you have questions about this medicine, talk to your doctor, pharmacist, or health care provider.  2023 Elsevier/Gold Standard (2021-11-10 00:00:00) Etoposide Injection What is this medication? ETOPOSIDE (e toe POE side) treats some types of cancer. It works by slowing down the growth of cancer cells. This medicine may be used for other purposes; ask your health care provider or pharmacist if you have questions. COMMON BRAND NAME(S): Etopophos, Toposar, VePesid What should I tell my care team before I take this  medication? They need to know if you have any of these conditions: Infection Kidney disease Liver disease Low blood counts, such as low white cell, platelet, red cell counts An unusual or allergic reaction to etoposide, other medications, foods, dyes, or preservatives If you or your partner are pregnant or trying to get pregnant Breastfeeding How should I use this medication? This medication is injected into a vein. It is given by your care team in a hospital or clinic setting. Talk to your care team about the use of this medication in children. Special care may be needed. Overdosage: If you think you have taken too much of this medicine contact a poison control center or emergency room at once. NOTE: This medicine is only for you. Do not share this medicine with others. What if I miss a dose? Keep appointments for follow-up doses. It is important not to miss your dose. Call your care team if you are unable to keep an appointment. What may interact with this medication? Warfarin This list may not describe all possible interactions. Give your health care provider  a list of all the medicines, herbs, non-prescription drugs, or dietary supplements you use. Also tell them if you smoke, drink alcohol, or use illegal drugs. Some items may interact with your medicine. What should I watch for while using this medication? Your condition will be monitored carefully while you are receiving this medication. This medication may make you feel generally unwell. This is not uncommon as chemotherapy can affect healthy cells as well as cancer cells. Report any side effects. Continue your course of treatment even though you feel ill unless your care team tells you to stop. This medication can cause serious side effects. To reduce the risk, your care team may give you other medications to take before receiving this one. Be sure to follow the directions from your care team. This medication may increase your risk of  getting an infection. Call your care team for advice if you get a fever, chills, sore throat, or other symptoms of a cold or flu. Do not treat yourself. Try to avoid being around people who are sick. This medication may increase your risk to bruise or bleed. Call your care team if you notice any unusual bleeding. Talk to your care team about your risk of cancer. You may be more at risk for certain types of cancers if you take this medication. Talk to your care team if you may be pregnant. Serious birth defects can occur if you take this medication during pregnancy and for 6 months after the last dose. You will need a negative pregnancy test before starting this medication. Contraception is recommended while taking this medication and for 6 months after the last dose. Your care team can help you find the option that works for you. If your partner can get pregnant, use a condom during sex while taking this medication and for 4 months after the last dose. Do not breastfeed while taking this medication. This medication may cause infertility. Talk to your care team if you are concerned about your fertility. What side effects may I notice from receiving this medication? Side effects that you should report to your care team as soon as possible: Allergic reactions--skin rash, itching, hives, swelling of the face, lips, tongue, or throat Infection--fever, chills, cough, sore throat, wounds that don't heal, pain or trouble when passing urine, general feeling of discomfort or being unwell Low red blood cell level--unusual weakness or fatigue, dizziness, headache, trouble breathing Unusual bruising or bleeding Side effects that usually do not require medical attention (report to your care team if they continue or are bothersome): Diarrhea Fatigue Hair loss Loss of appetite Nausea Vomiting This list may not describe all possible side effects. Call your doctor for medical advice about side effects. You may  report side effects to FDA at 1-800-FDA-1088. Where should I keep my medication? This medication is given in a hospital or clinic. It will not be stored at home. NOTE: This sheet is a summary. It may not cover all possible information. If you have questions about this medicine, talk to your doctor, pharmacist, or health care provider.  2023 Elsevier/Gold Standard (2021-12-10 00:00:00)

## 2022-03-11 ENCOUNTER — Ambulatory Visit (HOSPITAL_COMMUNITY)
Admission: RE | Admit: 2022-03-11 | Discharge: 2022-03-11 | Disposition: A | Discharge status: Home / Self Care | Payer: Medicare HMO | Source: Ambulatory Visit | Attending: Internal Medicine | Admitting: Internal Medicine

## 2022-03-11 ENCOUNTER — Other Ambulatory Visit: Payer: Self-pay | Admitting: Radiology

## 2022-03-11 ENCOUNTER — Encounter: Payer: Self-pay | Admitting: Internal Medicine

## 2022-03-11 ENCOUNTER — Inpatient Hospital Stay: Payer: Medicare HMO

## 2022-03-11 VITALS — BP 152/70 | HR 83 | Temp 98.3°F | Resp 18 | Ht 62.0 in | Wt 165.9 lb

## 2022-03-11 DIAGNOSIS — I4892 Unspecified atrial flutter: Secondary | ICD-10-CM | POA: Diagnosis not present

## 2022-03-11 DIAGNOSIS — R0600 Dyspnea, unspecified: Secondary | ICD-10-CM | POA: Insufficient documentation

## 2022-03-11 DIAGNOSIS — C3490 Malignant neoplasm of unspecified part of unspecified bronchus or lung: Secondary | ICD-10-CM | POA: Diagnosis not present

## 2022-03-11 DIAGNOSIS — E785 Hyperlipidemia, unspecified: Secondary | ICD-10-CM | POA: Diagnosis not present

## 2022-03-11 DIAGNOSIS — Z87891 Personal history of nicotine dependence: Secondary | ICD-10-CM | POA: Insufficient documentation

## 2022-03-11 DIAGNOSIS — I716 Thoracoabdominal aortic aneurysm, without rupture, unspecified: Secondary | ICD-10-CM | POA: Insufficient documentation

## 2022-03-11 DIAGNOSIS — J449 Chronic obstructive pulmonary disease, unspecified: Secondary | ICD-10-CM | POA: Insufficient documentation

## 2022-03-11 DIAGNOSIS — I10 Essential (primary) hypertension: Secondary | ICD-10-CM | POA: Insufficient documentation

## 2022-03-11 DIAGNOSIS — C3431 Malignant neoplasm of lower lobe, right bronchus or lung: Secondary | ICD-10-CM

## 2022-03-11 MED ORDER — TRILACICLIB DIHYDROCHLORIDE INJECTION 300 MG
240.0000 mg/m2 | Freq: Once | INTRAVENOUS | Status: AC
  Administered 2022-03-11: 465 mg via INTRAVENOUS
  Filled 2022-03-11: qty 31

## 2022-03-11 MED ORDER — SODIUM CHLORIDE 0.9 % IV SOLN
100.0000 mg/m2 | Freq: Once | INTRAVENOUS | Status: AC
  Administered 2022-03-11: 190 mg via INTRAVENOUS
  Filled 2022-03-11: qty 9.5

## 2022-03-11 MED ORDER — SODIUM CHLORIDE 0.9 % IV SOLN
10.0000 mg | Freq: Once | INTRAVENOUS | Status: AC
  Administered 2022-03-11: 10 mg via INTRAVENOUS
  Filled 2022-03-11: qty 10

## 2022-03-11 MED ORDER — GADOBUTROL 1 MMOL/ML IV SOLN
7.5000 mL | Freq: Once | INTRAVENOUS | Status: AC | PRN
  Administered 2022-03-11: 7.5 mL via INTRAVENOUS

## 2022-03-11 MED ORDER — SODIUM CHLORIDE 0.9 % IV SOLN: Freq: Once | INTRAVENOUS | Status: AC

## 2022-03-11 MED FILL — Dexamethasone Sodium Phosphate Inj 100 MG/10ML: INTRAMUSCULAR | Qty: 1 | Status: AC

## 2022-03-11 NOTE — Patient Instructions (Signed)
[facility]  Discharge Instructions: Thank you for choosing [facility] to provide your oncology and hematology care.   If you have a lab appointment with the [facility], please go directly to the [facility] and check in at the registration area.   Wear comfortable clothing and clothing appropriate for easy access to any Portacath or PICC line.   We strive to give you quality time with your provider. You may need to reschedule your appointment if you arrive late (15 or more minutes).  Arriving late affects you and other patients whose appointments are after yours.  Also, if you miss three or more appointments without notifying the office, you may be dismissed from the clinic at the provider's discretion.      For prescription refill requests, have your pharmacy contact our office and allow 72 hours for refills to be completed.    Today you received the following chemotherapy and/or immunotherapy agents: Etoposide, Cosela.       To help prevent nausea and vomiting after your treatment, we encourage you to take your nausea medication as directed.  BELOW ARE SYMPTOMS THAT SHOULD BE REPORTED IMMEDIATELY: *FEVER GREATER THAN 100.4 F (38 C) OR HIGHER *CHILLS OR SWEATING *NAUSEA AND VOMITING THAT IS NOT CONTROLLED WITH YOUR NAUSEA MEDICATION *UNUSUAL SHORTNESS OF BREATH *UNUSUAL BRUISING OR BLEEDING *URINARY PROBLEMS (pain or burning when urinating, or frequent urination) *BOWEL PROBLEMS (unusual diarrhea, constipation, pain near the anus) TENDERNESS IN MOUTH AND THROAT WITH OR WITHOUT PRESENCE OF ULCERS (sore throat, sores in mouth, or a toothache) UNUSUAL RASH, SWELLING OR PAIN  UNUSUAL VAGINAL DISCHARGE OR ITCHING   Items with * indicate a potential emergency and should be followed up as soon as possible or go to the Emergency Department if any problems should occur.  Please show the CHEMOTHERAPY ALERT CARD or IMMUNOTHERAPY ALERT CARD at  check-in to the Emergency Department and triage nurse.  Should you have questions after your visit or need to cancel or reschedule your appointment, please contact [facility]  Dept: [phone]  and follow the prompts.  Office hours are 8:00 a.m. to 4:30 p.m. Monday - Friday. Please note that voicemails left after 4:00 p.m. may not be returned until the following business day.  We are closed weekends and major holidays. You have access to a nurse at all times for urgent questions. Please call the main number to the clinic Dept: [phone] and follow the prompts.   For any non-urgent questions, you may also contact your provider using MyChart. We now offer e-Visits for anyone 60 and older to request care online for non-urgent symptoms. For details visit [email or web address].   Also download the MyChart app! Go to the app store, search "MyChart", open the app, select [facility], and log in with your MyChart username and password.  Masks are optional in the cancer centers. If you would like for your care team to wear a mask while they are taking care of you, please let them know. You may have one support person who is at least 75 years old accompany you for your appointments.

## 2022-03-11 NOTE — H&P (Signed)
Chief Complaint: Patient was seen in consultation today for lung cancer  at the request of [facility]  Referring Physician(s): Mohamed,Mohamed  Supervising Physician: Corrie Mckusick  Patient Status: [facility] - Out-pt  History of Present Illness: Adam Jordan is a 76 y.o. male with PMH significant for COPD, dyspnea, HLD, HTN, paroxsymal a-flutter, thoracoabdominal aneurysm and long history of tobacco abuse. Pt was seen by vascular surgery for evaluation of thoracoabdominal aneurysm with CT angiogram of chest, abd, pelvis 09/03/21. Scan at that time found incidental pulmonary nodules. Super D chest CT 02/08/22 showed spiculated right lower lobe nodule. Bronchoscopic biopsy of nodules were consistent with small cell carcinoma. Pt was referred to oncology for evaluation. Pt has been referred to IR by Dr. Curt Bears for tunneled catheter with port placement.   Past Medical History:  Diagnosis Date   Anxiety    Arthritis    osteoarthrititis- knees and most joints.   COPD (chronic obstructive pulmonary disease) (HCC)    moderate -no regular use of inhalers- rare use of oxygen as sexual activity   Dyspnea    outside in hot weather and also with pollen   Hyperlipidemia    Hypertension    Neuromuscular disorder ([place])    feet   Paroxysmal atrial flutter ([place]) 01/15/2021   Thoracoabdominal aneurysm ([place])    s/p FEVAR 4 Vessel TABME 02/19/20 Dr. Katy Apo    Past Surgical History:  Procedure Laterality Date   ABDOMINAL AORTIC ANEURYSM REPAIR  02/06/2020   BRONCHIAL BIOPSY  02/23/2022   Procedure: BRONCHIAL BIOPSIES;  Surgeon: Collene Gobble, MD;  Location: [facility] ENDOSCOPY;  Service: Pulmonary;;   BRONCHIAL BRUSHINGS  02/23/2022   Procedure: BRONCHIAL BRUSHINGS;  Surgeon: Collene Gobble, MD;  Location: [facility] ENDOSCOPY;  Service: Pulmonary;;   BRONCHIAL NEEDLE ASPIRATION BIOPSY  02/23/2022   Procedure: BRONCHIAL NEEDLE ASPIRATION BIOPSIES;  Surgeon: Collene Gobble, MD;  Location: [place]  ENDOSCOPY;  Service: Pulmonary;;   BROW LIFT Bilateral 12/11/2021   Procedure: UPPER LID BLEPHAROPLASTY;  Surgeon: Irene Limbo, MD;  Location: [place];  Service: Plastics;  Laterality: Bilateral;   CYST REMOVAL LEG Left 07/03/2021   Procedure: EXCISION CYST LEFT BUTTOCK;  Surgeon: Jovita Kussmaul, MD;  Location: [place];  Service: General;  Laterality: Left;   EYE SURGERY Bilateral    cataract surgery   FINGER ARTHROPLASTY Left    left thumb-Dr. Amedeo Plenty   INGUINAL HERNIA REPAIR Left 07/03/2021   Procedure: LEFT INGUINAL HERNIA REPAIR WITH MESH;  Surgeon: Jovita Kussmaul, MD;  Location: [place];  Service: General;  Laterality: Left;   INSERTION OF MESH N/A 07/03/2021   Procedure: INSERTION OF MESH X2;  Surgeon: Jovita Kussmaul, MD;  Location: [place];  Service: General;  Laterality: N/A;   IR RADIOLOGIST EVAL & MGMT  10/25/2019   KNEE ARTHROSCOPY Left    KNEE SURGERY Left    Bakers cyst x2   SHOULDER ARTHROSCOPY Right    thumb surgery     TONSILLECTOMY     TOTAL KNEE ARTHROPLASTY Left 03/19/2016   Procedure: LEFT TOTAL KNEE ARTHROPLASTY;  Surgeon: Sydnee Cabal, MD;  Location: [facility];  Service: Orthopedics;  Laterality: Left;   TOTAL SHOULDER REPLACEMENT Right    UMBILICAL HERNIA REPAIR N/A 07/03/2021   Procedure: UMBILICAL HERNIA REPAIR WITH MESH;  Surgeon: Jovita Kussmaul, MD;  Location: [place];  Service: General;  Laterality: N/A;   VASCULAR SURGERY     VASECTOMY     VIDEO BRONCHOSCOPY WITH  ENDOBRONCHIAL ULTRASOUND Right 02/23/2022   Procedure: VIDEO BRONCHOSCOPY WITH ENDOBRONCHIAL ULTRASOUND;  Surgeon: Collene Gobble, MD;  Location: [facility] ENDOSCOPY;  Service: Pulmonary;  Laterality: Right;   VIDEO BRONCHOSCOPY WITH RADIAL ENDOBRONCHIAL ULTRASOUND  02/23/2022   Procedure: VIDEO BRONCHOSCOPY WITH RADIAL ENDOBRONCHIAL ULTRASOUND;  Surgeon: Collene Gobble, MD;  Location: [facility] ENDOSCOPY;  Service: Pulmonary;;    Allergies: Losartan, Atorvastatin calcium, and Pollen extract-tree  extract [pollen extract]  Medications: Prior to Admission medications   Medication Sig Start Date End Date Taking? Authorizing Provider  acetaminophen (TYLENOL) 500 MG tablet Take 500 mg by mouth daily as needed for moderate pain or headache.    [provider]  Alum Hydroxide-Mag Trisilicate (GAVISCON) 16-10.9 MG CHEW Chew 1 tablet by mouth daily as needed (heartburn).    [provider]  apixaban (ELIQUIS) 5 MG TABS tablet Take 1 tablet (5 mg total) by mouth 2 (two) times daily. Okay to restart this medication on 02/24/2022 02/23/22   Collene Gobble, MD  atorvastatin (LIPITOR) 20 MG tablet Take 20 mg by mouth at bedtime. 10/18/19   [provider]  clonazePAM (KLONOPIN) 1 MG tablet Take 1 tablet (1 mg total) by mouth 2 (two) times daily as needed for anxiety. 07/17/15   Sinda Du, MD  fluticasone (FLONASE) 50 MCG/ACT nasal spray Place 1 spray into both nostrils 2 (two) times daily as needed for allergies.    [provider]  lidocaine-prilocaine (EMLA) cream Apply to the Port-A-Cath site 30-60 minutes before chemotherapy 03/04/22   Curt Bears, MD  meloxicam (MOBIC) 15 MG tablet Take 15 mg by mouth daily as needed for pain.    [provider]  Multiple Vitamin (MULTIVITAMIN WITH MINERALS) TABS tablet Take 1 tablet by mouth daily. 07/17/15   Sinda Du, MD  Naphazoline HCl (CLEAR EYES OP) Place 1 drop into both eyes daily.    [provider]  OVER THE COUNTER MEDICATION Take 1-3 capsules by mouth at bedtime. Oxy-Powder colon cleanse otc supplement/ Unknown strenght    [provider]  PROAIR HFA 108 (90 BASE) MCG/ACT inhaler Inhale 2 puffs into the lungs every 6 (six) hours as needed for wheezing or shortness of breath.  04/11/15   [provider]  prochlorperazine (COMPAZINE) 10 MG tablet Take 1 tablet (10 mg total) by mouth every 6 (six) hours as needed for nausea or vomiting. 03/04/22   Curt Bears, MD   sildenafil (REVATIO) 20 MG tablet Take 60 mg by mouth daily as needed (ED). 01/18/14   [provider]  triamcinolone cream (KENALOG) 0.1 % Apply 1 Application topically daily. 02/07/22   [provider]     Family History  Problem Relation Age of Onset   Ovarian cancer Mother    Breast cancer Mother    Macular degeneration Mother    Heart attack Father     Social History   Socioeconomic History   Marital status: Married    Spouse name: Not on file   Number of children: Not on file   Years of education: Not on file   Highest education level: Not on file  Occupational History   Not on file  Tobacco Use   Smoking status: Former    Years: 50.00    Types: Cigarettes    Quit date: 2020    Years since quitting: 3.6   Smokeless tobacco: Never   Tobacco comments:    Quit 2 months ago 02/17/22 [facility]   Vaping Use   Vaping Use:  Never used  Substance and Sexual Activity   Alcohol use: Not Currently   Drug use: No   Sexual activity: Yes  Other Topics Concern   Not on file  Social History Narrative   Not on file   Social Determinants of Health   Financial Resource Strain: Not on file  Food Insecurity: Not on file  Transportation Needs: Not on file  Physical Activity: Not on file  Stress: Not on file  Social Connections: Not on file     Review of Systems: A 12 point ROS discussed and pertinent positives are indicated in the HPI above.  All other systems are negative.  Review of Systems  Constitutional:  Negative for chills and fever.  Respiratory:  Positive for cough and shortness of breath.   Cardiovascular:  Negative for chest pain.  Gastrointestinal:  Negative for nausea and vomiting.  Neurological:  Positive for headaches.    Vital Signs: BP (!) 143/73   Pulse (!) 54   Temp 97.9 F (36.6 C) (Oral)   Resp 18   Ht 5\' 8"  (1.727 m)   Wt 165 lb (74.8 kg)   SpO2 95%   BMI 25.09 kg/m      Physical Exam Vitals reviewed.  Constitutional:       General: He is not in acute distress.    Appearance: Normal appearance. He is not ill-appearing.  HENT:     Head: Normocephalic and atraumatic.     Mouth/Throat:     Mouth: Mucous membranes are dry.     Pharynx: Oropharynx is clear.  Eyes:     Extraocular Movements: Extraocular movements intact.     Pupils: Pupils are equal, round, and reactive to light.  Cardiovascular:     Rate and Rhythm: Regular rhythm. Bradycardia present.     Pulses: Normal pulses.     Heart sounds: Normal heart sounds.  Pulmonary:     Effort: Pulmonary effort is normal. No respiratory distress.     Breath sounds: Normal breath sounds.  Abdominal:     General: Bowel sounds are normal. There is no distension.     Palpations: Abdomen is soft.     Tenderness: There is no abdominal tenderness. There is no guarding.  Musculoskeletal:     Right lower leg: No edema.     Left lower leg: No edema.  Skin:    General: Skin is warm and dry.  Neurological:     Mental Status: He is alert and oriented to person, place, and time.  Psychiatric:        Mood and Affect: Mood normal.        Behavior: Behavior normal.        Thought Content: Thought content normal.        Judgment: Judgment normal.     Imaging: MR BRAIN W WO CONTRAST  Result Date: 03/11/2022 CLINICAL DATA:  Small cell lung cancer, staging EXAM: MRI HEAD WITHOUT AND WITH CONTRAST TECHNIQUE: Multiplanar, multiecho pulse sequences of the brain and surrounding structures were obtained without and with intravenous contrast. CONTRAST:  7.58mL GADAVIST GADOBUTROL 1 MMOL/ML IV SOLN COMPARISON:  None Available. FINDINGS: Brain: No restricted diffusion to suggest acute or subacute infarct. No acute hemorrhage, mass, mass effect, midline shift. No hydrocephalus or extra-axial collection. Confluent and scattered T2 hyperintense signal in the periventricular white matter, likely the sequela of mild-to-moderate chronic small vessel ischemic disease. Remote lacunar infarcts  in the bilateral cerebellar hemispheres and external capsules. No abnormal parenchymal or meningeal enhancement.  Vascular: Normal arterial flow voids. Normal arterial and venous enhancement Skull and upper cervical spine: Normal marrow signal. Sinuses/Orbits: Mucosal thickening in the left sphenoid sinus. Status post bilateral lens replacements. Other: The mastoids are well aerated. IMPRESSION: No acute intracranial process. No evidence of metastatic disease in the brain. Electronically Signed   By: Merilyn Baba M.D.   On: 03/11/2022 21:16   NM PET Image Initial (PI) Skull Base To Thigh  Result Date: 03/04/2022 CLINICAL DATA:  Initial treatment strategy for small cell lung cancer. EXAM: NUCLEAR MEDICINE PET SKULL BASE TO THIGH TECHNIQUE: 8.4 mCi F-18 FDG was injected intravenously. Full-ring PET imaging was performed from the skull base to thigh after the radiotracer. CT data was obtained and used for attenuation correction and anatomic localization. Fasting blood glucose: 111 mg/dl COMPARISON:  CT 02/08/2022 FINDINGS: Mediastinal blood pool activity: SUV max 2.19 Liver activity: SUV max NA NECK: No hypermetabolic lymph nodes in the neck. Incidental CT findings: none CHEST: Right lower lobe lung nodule measures 0.8 cm within SUV max of 4.98, image 48/7. At a slightly more caudal level there is a second lung lesion measuring 2.3 by 1.0 cm with SUV max of 5.29, image 54/7. Low right paratracheal lymph node measures 0.9 cm with SUV max of 4.09, image 77/4. Subcarinal lymph node measures 1.9 cm with SUV max of 12.63, image 82/4. Right hilar nodal mass measures 2 x 3.3 cm with SUV max of 14.43, image 88/4. Subtle focus of mild increased uptake within the periphery of the left upper lobe has an SUV max of 2.3. There is a tiny nodule measuring approximately 4 mm on the corresponding CT images, image 32/7. Incidental CT findings: Moderate to severe centrilobular and paraseptal emphysema. Aortic atherosclerosis and  coronary artery calcifications. No pericardial effusion. ABDOMEN/PELVIS: There is equivocal uptake within the left adrenal gland within SUV max of 2.75, image 121 of the fused images. Subtle thickening of the adrenal gland is noted which measures up to 9 mm, image 122/4. No abnormal tracer activity within the liver, pancreas, or spleen. No signs of abdominopelvic nodal metastasis. Incidental CT findings: Status post stent graft repair of abdominal aortic aneurysm. SKELETON: FDG avid bone metastases identified involving the L4 vertebral body. The SUV max is equal to 17.43, image 146 of the axial fused images. No corresponding lytic or sclerotic bone lesions identified on the CT images. FDG avid lesion within the right iliac bone has an SUV max of 5.97, image 164 of the fused axial images. No corresponding lytic or sclerotic bone lesion identified on the CT images. Incidental CT findings: none IMPRESSION: 1. There are 2 lesions within the right lower lobe which are FDG avid and compatible with primary bronchogenic carcinoma. 2. FDG avid ipsilateral hilar and mediastinal nodal metastasis and subcarinal nodal metastasis. 3. FDG avid bone lesions are noted within the right iliac bone and L4 vertebral body without corresponding lytic or sclerotic changes on the CT images. Findings are compatible with osseous metastasis. 4. Mild thickening with mild increased FDG uptake localizing to the left adrenal gland. Equivocal for adrenal metastasis. 5. Aortic Atherosclerosis (ICD10-I70.0) and Emphysema (ICD10-J43.9). 6. Coronary artery calcifications 7. Status post stent graft repair of abdominal aortic aneurysm. Electronically Signed   By: Kerby Moors M.D.   On: 03/04/2022 08:54   DG Chest Port 1 View  Result Date: 02/23/2022 CLINICAL DATA:  Status post bronchoscopy with biopsy EXAM: PORTABLE CHEST 1 VIEW COMPARISON:  CT 02/08/2022 FINDINGS: Airspace disease in the right lower lobe  likely related to bronchoscopy. No  pneumothorax. There is hyperinflation of the lungs compatible with COPD. No effusions. Heart is normal size. Aortic atherosclerosis. IMPRESSION: Right lower lobe airspace disease likely related to bronchoscopy. No pneumothorax. COPD. Electronically Signed   By: Rolm Baptise M.D.   On: 02/23/2022 11:54   DG C-ARM BRONCHOSCOPY  Result Date: 02/23/2022 C-ARM BRONCHOSCOPY: Fluoroscopy was utilized by the requesting physician.  No radiographic interpretation.    Labs:  CBC: Recent Labs    06/30/21 1516 02/23/22 0744 03/04/22 0755 03/10/22 1100  WBC 10.1 9.4 9.3 9.9  HGB 14.7 15.7 15.3 16.0  HCT 44.6 46.1 44.6 46.3  PLT 198 195 197 209    COAGS: No results for input(s): "INR", "APTT" in the last 8760 hours.  BMP: Recent Labs    06/30/21 1516 02/23/22 0835 03/04/22 0755 03/10/22 1100  NA 135 136 136 137  K 4.1 4.6 4.3 4.2  CL 103 105 103 103  CO2 24 23 28 27   GLUCOSE 98 97 103* 102*  BUN 14 14 17 15   CALCIUM 9.1 8.9 9.5 9.7  CREATININE 0.88 0.94 0.98 0.88  GFRNONAA >60 >60 >60 >60    LIVER FUNCTION TESTS: Recent Labs    03/04/22 0755 03/10/22 1100  BILITOT 0.8 0.8  AST 16 15  ALT 14 15  ALKPHOS 59 63  PROT 7.2 7.8  ALBUMIN 4.4 4.7    TUMOR MARKERS: No results for input(s): "AFPTM", "CEA", "CA199", "CHROMGRNA" in the last 8760 hours.  Assessment and Plan: History of COPD, dyspnea, HLD, HTN, paroxsymal a-flutter, thoracoabdominal aneurysm and long history of tobacco abuse. Pt was seen by vascular surgery for evaluation of thoracoabdominal aneurysm with CT angiogram of chest, abd, pelvis 09/03/21. Scan at that time found incidental pulmonary nodules. Super D chest CT 02/08/22 showed spiculated right lower lobe nodule. Bronchoscopic biopsy of nodules were consistent with small cell carcinoma. Pt was referred to oncology for evaluation. Pt has been referred to IR by Dr. Curt Bears for tunneled catheter with port placement.   Pt resting on stretcher.  He is A&O,  calm and pleasant.  He is in no distress. Pt is NPO per order.    Risks and benefits of image guided tunneled catheter with port placement was discussed with the patient including, but not limited to bleeding, infection, pneumothorax, or fibrin sheath development and need for additional procedures.  All of the patient's questions were answered, patient is agreeable to proceed. Consent signed and in chart.   Thank you for this interesting consult.  I greatly enjoyed meeting Adam Jordan and look forward to participating in their care.  A copy of this report was sent to the requesting provider on this date.  Electronically Signed: Tyson Alias, NP 03/12/2022, 12:21 PM   I spent a total of 20 minutes in face to face in clinical consultation, greater than 50% of which was counseling/coordinating care for lung cancer.

## 2022-03-11 NOTE — Progress Notes (Signed)
Called pt to introduce myself as his Arboriculturist.  Pt has 2 insurances so copay assistance shouldn't be needed.  I informed him of the [employer], went over what it covers and gave him the income requirement.  He would like to apply so he will provide proof of income, if approved I will give him an expense sheet and my card for any questions or concerns he may have in the future.

## 2022-03-12 ENCOUNTER — Other Ambulatory Visit: Payer: Self-pay | Admitting: Internal Medicine

## 2022-03-12 ENCOUNTER — Encounter (HOSPITAL_COMMUNITY): Payer: Self-pay

## 2022-03-12 ENCOUNTER — Other Ambulatory Visit: Payer: Self-pay

## 2022-03-12 ENCOUNTER — Ambulatory Visit (HOSPITAL_COMMUNITY)
Admission: RE | Admit: 2022-03-12 | Discharge: 2022-03-12 | Disposition: A | Discharge status: Home / Self Care | Payer: Medicare HMO | Source: Ambulatory Visit | Attending: Internal Medicine | Admitting: Internal Medicine

## 2022-03-12 ENCOUNTER — Inpatient Hospital Stay: Payer: Medicare HMO

## 2022-03-12 VITALS — BP 154/69 | HR 54 | Temp 97.6°F | Resp 18

## 2022-03-12 DIAGNOSIS — C3490 Malignant neoplasm of unspecified part of unspecified bronchus or lung: Secondary | ICD-10-CM | POA: Diagnosis not present

## 2022-03-12 DIAGNOSIS — I10 Essential (primary) hypertension: Secondary | ICD-10-CM | POA: Diagnosis not present

## 2022-03-12 DIAGNOSIS — Z800 Family history of malignant neoplasm of digestive organs: Secondary | ICD-10-CM | POA: Diagnosis not present

## 2022-03-12 DIAGNOSIS — Z5111 Encounter for antineoplastic chemotherapy: Secondary | ICD-10-CM | POA: Diagnosis not present

## 2022-03-12 DIAGNOSIS — Z803 Family history of malignant neoplasm of breast: Secondary | ICD-10-CM | POA: Diagnosis not present

## 2022-03-12 DIAGNOSIS — C3431 Malignant neoplasm of lower lobe, right bronchus or lung: Secondary | ICD-10-CM

## 2022-03-12 DIAGNOSIS — Z8042 Family history of malignant neoplasm of prostate: Secondary | ICD-10-CM | POA: Diagnosis not present

## 2022-03-12 DIAGNOSIS — Z452 Encounter for adjustment and management of vascular access device: Secondary | ICD-10-CM | POA: Diagnosis not present

## 2022-03-12 DIAGNOSIS — C771 Secondary and unspecified malignant neoplasm of intrathoracic lymph nodes: Secondary | ICD-10-CM | POA: Diagnosis not present

## 2022-03-12 DIAGNOSIS — Z8041 Family history of malignant neoplasm of ovary: Secondary | ICD-10-CM | POA: Diagnosis not present

## 2022-03-12 DIAGNOSIS — Z79899 Other long term (current) drug therapy: Secondary | ICD-10-CM | POA: Diagnosis not present

## 2022-03-12 DIAGNOSIS — C7951 Secondary malignant neoplasm of bone: Secondary | ICD-10-CM | POA: Diagnosis not present

## 2022-03-12 DIAGNOSIS — I716 Thoracoabdominal aortic aneurysm, without rupture, unspecified: Secondary | ICD-10-CM | POA: Diagnosis not present

## 2022-03-12 DIAGNOSIS — R0600 Dyspnea, unspecified: Secondary | ICD-10-CM | POA: Diagnosis not present

## 2022-03-12 DIAGNOSIS — Z5112 Encounter for antineoplastic immunotherapy: Secondary | ICD-10-CM | POA: Diagnosis not present

## 2022-03-12 DIAGNOSIS — J449 Chronic obstructive pulmonary disease, unspecified: Secondary | ICD-10-CM | POA: Diagnosis not present

## 2022-03-12 DIAGNOSIS — E785 Hyperlipidemia, unspecified: Secondary | ICD-10-CM | POA: Diagnosis not present

## 2022-03-12 DIAGNOSIS — Z87891 Personal history of nicotine dependence: Secondary | ICD-10-CM | POA: Diagnosis not present

## 2022-03-12 DIAGNOSIS — I4892 Unspecified atrial flutter: Secondary | ICD-10-CM | POA: Diagnosis not present

## 2022-03-12 HISTORY — PX: IR IMAGING GUIDED PORT INSERTION: IMG5740

## 2022-03-12 MED ORDER — SODIUM CHLORIDE 0.9 % IV SOLN
100.0000 mg/m2 | Freq: Once | INTRAVENOUS | Status: AC
  Administered 2022-03-12: 190 mg via INTRAVENOUS
  Filled 2022-03-12: qty 9.5

## 2022-03-12 MED ORDER — SODIUM CHLORIDE 0.9 % IV SOLN: Freq: Once | INTRAVENOUS | Status: AC

## 2022-03-12 MED ORDER — SODIUM CHLORIDE 0.9% FLUSH
10.0000 mL | Freq: Once | INTRAVENOUS | Status: AC
  Administered 2022-03-12: 10 mL via INTRAVENOUS

## 2022-03-12 MED ORDER — SODIUM CHLORIDE 0.9 % IV SOLN: INTRAVENOUS | Status: DC

## 2022-03-12 MED ORDER — HEPARIN SOD (PORK) LOCK FLUSH 10 UNIT/ML IV SOLN: 10.0000 [IU] | Freq: Once | INTRAVENOUS | Status: DC

## 2022-03-12 MED ORDER — SODIUM CHLORIDE 0.9 % IV SOLN
10.0000 mg | Freq: Once | INTRAVENOUS | Status: AC
  Administered 2022-03-12: 10 mg via INTRAVENOUS
  Filled 2022-03-12: qty 10

## 2022-03-12 MED ORDER — FENTANYL CITRATE (PF) 100 MCG/2ML IJ SOLN
INTRAMUSCULAR | Status: AC
  Filled 2022-03-12: qty 2

## 2022-03-12 MED ORDER — TRILACICLIB DIHYDROCHLORIDE INJECTION 300 MG
240.0000 mg/m2 | Freq: Once | INTRAVENOUS | Status: AC
  Administered 2022-03-12: 465 mg via INTRAVENOUS
  Filled 2022-03-12: qty 31

## 2022-03-12 MED ORDER — MIDAZOLAM HCL 2 MG/2ML IJ SOLN
INTRAMUSCULAR | Status: AC
  Filled 2022-03-12: qty 4

## 2022-03-12 MED ORDER — LIDOCAINE HCL 1 % IJ SOLN
INTRAMUSCULAR | Status: AC
  Filled 2022-03-12: qty 20

## 2022-03-12 MED ORDER — HEPARIN SOD (PORK) LOCK FLUSH 100 UNIT/ML IV SOLN
INTRAVENOUS | Status: AC
  Filled 2022-03-12: qty 5

## 2022-03-12 MED ORDER — HEPARIN SOD (PORK) LOCK FLUSH 100 UNIT/ML IV SOLN
500.0000 [IU] | Freq: Once | INTRAVENOUS | Status: AC
  Administered 2022-03-12: 500 [IU] via INTRAVENOUS

## 2022-03-12 NOTE — Progress Notes (Signed)
Pt. Returned from IR - new port placement.  Pt. Has infusion at 1430.    Post assessment; assisted patient to get clothes on; and [facility].   Spoke with Edwena Blow RN and report was given; aware of cons. Sedation / VS/ port placement which was still accessed.

## 2022-03-12 NOTE — Discharge Instructions (Signed)
Please call Interventional Radiology clinic [phone] with any questions or concerns.  You may remove your dressing and shower tomorrow.  DO NOT use EMLA cream on your port site for 2 weeks as this cream will remove surgical glue on your incision.  Implanted Port Insertion, Care After This sheet gives you information about how to care for yourself after your procedure. Your health care provider may also give you more specific instructions. If you have problems or questions, contact your health care provider. What can I expect after the procedure? After the procedure, it is common to have: Discomfort at the port insertion site. Bruising on the skin over the port. This should improve over 3-4 days. Follow these instructions at home: [facility] care After your port is placed, you will get a manufacturer's information card. The card has information about your port. Keep this card with you at all times. Take care of the port as told by your health care provider. Ask your health care provider if you or a family member can get training for taking care of the port at home. A home health care nurse may also take care of the port. Make sure to remember what type of port you have. Incision care Follow instructions from your health care provider about how to take care of your port insertion site. Make sure you: Wash your hands with soap and water before and after you change your bandage (dressing). If soap and water are not available, use hand sanitizer. Change your dressing as told by your health care provider. Leave stitches (sutures), skin glue, or adhesive strips in place. These skin closures may need to stay in place for 2 weeks or longer. If adhesive strip edges start to loosen and curl up, you may trim the loose edges. Do not remove adhesive strips completely unless your health care provider tells you to do that. Check your port insertion site every day for signs of infection. Check for: Redness,  swelling, or pain. Fluid or blood. Warmth. Pus or a bad smell.        Activity Return to your normal activities as told by your health care provider. Ask your health care provider what activities are safe for you. Do not lift anything that is heavier than 10 lb (4.5 kg), or the limit that you are told, until your health care provider says that it is safe. General instructions Take over-the-counter and prescription medicines only as told by your health care provider. Do not take baths, swim, or use a hot tub until your health care provider approves. Ask your health care provider if you may take showers. You may only be allowed to take sponge baths. Do not drive for 24 hours if you were given a sedative during your procedure. Wear a medical alert bracelet in case of an emergency. This will tell any health care providers that you have a port. Keep all follow-up visits as told by your health care provider. This is important. Contact a health care provider if: You cannot flush your port with saline as directed, or you cannot draw blood from the port. You have a fever or chills. You have redness, swelling, or pain around your port insertion site. You have fluid or blood coming from your port insertion site. Your port insertion site feels warm to the touch. You have pus or a bad smell coming from the port insertion site. Get help right away if: You have chest pain or shortness of breath. You have bleeding from  your port that you cannot control. Summary Take care of the port as told by your health care provider. Keep the manufacturer's information card with you at all times. Change your dressing as told by your health care provider. Contact a health care provider if you have a fever or chills or if you have redness, swelling, or pain around your port insertion site. Keep all follow-up visits as told by your health care provider. This information is not intended to replace advice given to you  by your health care provider. Make sure you discuss any questions you have with your health care provider. Document Revised: 02/14/2018 Document Reviewed: 02/14/2018 Elsevier Patient Education  2021 [employer].   Moderate Conscious Sedation, Adult, Care After This sheet gives you information about how to care for yourself after your procedure. Your health care provider may also give you more specific instructions. If you have problems or questions, contact your health care provider. What can I expect after the procedure? After the procedure, it is common to have: Sleepiness for several hours. Impaired judgment for several hours. Difficulty with balance. Vomiting if you eat too soon. Follow these instructions at home: For the time period you were told by your health care provider: Rest. Do not participate in activities where you could fall or become injured. Do not drive or use machinery. Do not drink alcohol. Do not take sleeping pills or medicines that cause drowsiness. Do not make important decisions or sign legal documents. Do not take care of children on your own.        Eating and drinking Follow the diet recommended by your health care provider. Drink enough fluid to keep your urine pale yellow. If you vomit: Drink water, juice, or soup when you can drink without vomiting. Make sure you have little or no nausea before eating solid foods.    General instructions Take over-the-counter and prescription medicines only as told by your health care provider. Have a responsible adult stay with you for the time you are told. It is important to have someone help care for you until you are awake and alert. Do not smoke. Keep all follow-up visits as told by your health care provider. This is important. Contact a health care provider if: You are still sleepy or having trouble with balance after 24 hours. You feel light-headed. You keep feeling nauseous or you keep vomiting. You  develop a rash. You have a fever. You have redness or swelling around the IV site. Get help right away if: You have trouble breathing. You have new-onset confusion at home. Summary After the procedure, it is common to feel sleepy, have impaired judgment, or feel nauseous if you eat too soon. Rest after you get home. Know the things you should not do after the procedure. Follow the diet recommended by your health care provider and drink enough fluid to keep your urine pale yellow. Get help right away if you have trouble breathing or new-onset confusion at home. This information is not intended to replace advice given to you by your health care provider. Make sure you discuss any questions you have with your health care provider. Document Revised: 11/16/2019 Document Reviewed: 06/14/2019 Elsevier Patient Education  2021 [employer].

## 2022-03-12 NOTE — Procedures (Signed)
Interventional Radiology Procedure Note  Procedure: Placement of a right IJ approach single lumen PowerPort.  Tip is positioned at the superior cavoatrial junction and catheter is ready for immediate use.  Complications: None Recommendations:  - Ok to shower tomorrow - Do not submerge for 7 days - Routine line care   Signed,  Daneisha Surges S. Jalesa Thien, DO   

## 2022-03-12 NOTE — Patient Instructions (Signed)
[facility]  Discharge Instructions: Thank you for choosing [facility] to provide your oncology and hematology care.   If you have a lab appointment with the [facility], please go directly to the [facility] and check in at the registration area.   Wear comfortable clothing and clothing appropriate for easy access to any Portacath or PICC line.   We strive to give you quality time with your provider. You may need to reschedule your appointment if you arrive late (15 or more minutes).  Arriving late affects you and other patients whose appointments are after yours.  Also, if you miss three or more appointments without notifying the office, you may be dismissed from the clinic at the provider's discretion.      For prescription refill requests, have your pharmacy contact our office and allow 72 hours for refills to be completed.    Today you received the following chemotherapy and/or immunotherapy agents cosela, etoposide      To help prevent nausea and vomiting after your treatment, we encourage you to take your nausea medication as directed.  BELOW ARE SYMPTOMS THAT SHOULD BE REPORTED IMMEDIATELY: *FEVER GREATER THAN 100.4 F (38 C) OR HIGHER *CHILLS OR SWEATING *NAUSEA AND VOMITING THAT IS NOT CONTROLLED WITH YOUR NAUSEA MEDICATION *UNUSUAL SHORTNESS OF BREATH *UNUSUAL BRUISING OR BLEEDING *URINARY PROBLEMS (pain or burning when urinating, or frequent urination) *BOWEL PROBLEMS (unusual diarrhea, constipation, pain near the anus) TENDERNESS IN MOUTH AND THROAT WITH OR WITHOUT PRESENCE OF ULCERS (sore throat, sores in mouth, or a toothache) UNUSUAL RASH, SWELLING OR PAIN  UNUSUAL VAGINAL DISCHARGE OR ITCHING   Items with * indicate a potential emergency and should be followed up as soon as possible or go to the Emergency Department if any problems should occur.  Please show the CHEMOTHERAPY ALERT CARD or IMMUNOTHERAPY ALERT CARD at  check-in to the Emergency Department and triage nurse.  Should you have questions after your visit or need to cancel or reschedule your appointment, please contact [facility]  Dept: [phone]  and follow the prompts.  Office hours are 8:00 a.m. to 4:30 p.m. Monday - Friday. Please note that voicemails left after 4:00 p.m. may not be returned until the following business day.  We are closed weekends and major holidays. You have access to a nurse at all times for urgent questions. Please call the main number to the clinic Dept: [phone] and follow the prompts.   For any non-urgent questions, you may also contact your provider using MyChart. We now offer e-Visits for anyone 66 and older to request care online for non-urgent symptoms. For details visit [email or web address].   Also download the MyChart app! Go to the app store, search "MyChart", open the app, select [facility], and log in with your MyChart username and password.  Masks are optional in the cancer centers. If you would like for your care team to wear a mask while they are taking care of you, please let them know. You may have one support person who is at least 75 years old accompany you for your appointments.

## 2022-03-15 ENCOUNTER — Encounter: Payer: Self-pay | Admitting: Internal Medicine

## 2022-03-15 NOTE — Progress Notes (Signed)
Pt is approved for the $1000 [employer] grant.  

## 2022-03-24 ENCOUNTER — Inpatient Hospital Stay: Payer: Medicare HMO

## 2022-03-24 ENCOUNTER — Other Ambulatory Visit: Payer: Self-pay

## 2022-03-24 DIAGNOSIS — Z79899 Other long term (current) drug therapy: Secondary | ICD-10-CM | POA: Diagnosis not present

## 2022-03-24 DIAGNOSIS — Z5112 Encounter for antineoplastic immunotherapy: Secondary | ICD-10-CM | POA: Diagnosis not present

## 2022-03-24 DIAGNOSIS — Z8041 Family history of malignant neoplasm of ovary: Secondary | ICD-10-CM | POA: Diagnosis not present

## 2022-03-24 DIAGNOSIS — Z87891 Personal history of nicotine dependence: Secondary | ICD-10-CM | POA: Diagnosis not present

## 2022-03-24 DIAGNOSIS — Z5111 Encounter for antineoplastic chemotherapy: Secondary | ICD-10-CM | POA: Diagnosis not present

## 2022-03-24 DIAGNOSIS — Z800 Family history of malignant neoplasm of digestive organs: Secondary | ICD-10-CM | POA: Diagnosis not present

## 2022-03-24 DIAGNOSIS — C3431 Malignant neoplasm of lower lobe, right bronchus or lung: Secondary | ICD-10-CM

## 2022-03-24 DIAGNOSIS — Z8042 Family history of malignant neoplasm of prostate: Secondary | ICD-10-CM | POA: Diagnosis not present

## 2022-03-24 DIAGNOSIS — Z803 Family history of malignant neoplasm of breast: Secondary | ICD-10-CM | POA: Diagnosis not present

## 2022-03-24 DIAGNOSIS — Z95828 Presence of other vascular implants and grafts: Secondary | ICD-10-CM

## 2022-03-24 DIAGNOSIS — C7951 Secondary malignant neoplasm of bone: Secondary | ICD-10-CM | POA: Diagnosis not present

## 2022-03-24 DIAGNOSIS — C771 Secondary and unspecified malignant neoplasm of intrathoracic lymph nodes: Secondary | ICD-10-CM | POA: Diagnosis not present

## 2022-03-24 LAB — CBC WITH DIFFERENTIAL (CANCER CENTER ONLY)
Abs Immature Granulocytes: 0.01 10*3/uL (ref 0.00–0.07)
Basophils Absolute: 0 10*3/uL (ref 0.0–0.1)
Basophils Relative: 1 %
Eosinophils Absolute: 0.1 10*3/uL (ref 0.0–0.5)
Eosinophils Relative: 1 %
HCT: 38.3 % — ABNORMAL LOW (ref 39.0–52.0)
Hemoglobin: 13.2 g/dL (ref 13.0–17.0)
Immature Granulocytes: 0 %
Lymphocytes Relative: 30 %
Lymphs Abs: 1.2 10*3/uL (ref 0.7–4.0)
MCH: 30.8 pg (ref 26.0–34.0)
MCHC: 34.5 g/dL (ref 30.0–36.0)
MCV: 89.3 fL (ref 80.0–100.0)
Monocytes Absolute: 0.3 10*3/uL (ref 0.1–1.0)
Monocytes Relative: 8 %
Neutro Abs: 2.4 10*3/uL (ref 1.7–7.7)
Neutrophils Relative %: 60 %
Platelet Count: 93 10*3/uL — ABNORMAL LOW (ref 150–400)
RBC: 4.29 MIL/uL (ref 4.22–5.81)
RDW: 13.1 % (ref 11.5–15.5)
WBC Count: 4 10*3/uL (ref 4.0–10.5)
nRBC: 0 % (ref 0.0–0.2)

## 2022-03-24 LAB — CMP (CANCER CENTER ONLY)
ALT: 15 U/L (ref 0–44)
AST: 14 U/L — ABNORMAL LOW (ref 15–41)
Albumin: 4.2 g/dL (ref 3.5–5.0)
Alkaline Phosphatase: 57 U/L (ref 38–126)
Anion gap: 5 (ref 5–15)
BUN: 10 mg/dL (ref 8–23)
CO2: 28 mmol/L (ref 22–32)
Calcium: 9.5 mg/dL (ref 8.9–10.3)
Chloride: 104 mmol/L (ref 98–111)
Creatinine: 0.79 mg/dL (ref 0.61–1.24)
GFR, Estimated: >60 mL/min (ref >60)
Glucose, Bld: 140 mg/dL — ABNORMAL HIGH (ref 70–99)
Potassium: 4 mmol/L (ref 3.5–5.1)
Sodium: 137 mmol/L (ref 135–145)
Total Bilirubin: 0.5 mg/dL (ref 0.3–1.2)
Total Protein: 6.5 g/dL (ref 6.5–8.1)

## 2022-03-24 MED ORDER — HEPARIN SOD (PORK) LOCK FLUSH 100 UNIT/ML IV SOLN
500.0000 [IU] | Freq: Once | INTRAVENOUS | Status: AC
  Administered 2022-03-24: 500 [IU] via INTRAVENOUS

## 2022-03-24 MED ORDER — SODIUM CHLORIDE 0.9% FLUSH
10.0000 mL | Freq: Once | INTRAVENOUS | Status: AC
  Administered 2022-03-24: 10 mL via INTRAVENOUS

## 2022-03-24 MED ORDER — SODIUM CHLORIDE FLUSH 0.9 % IV SOLN: 10.0000 mL | Freq: Once | INTRAVENOUS | Status: DC

## 2022-03-30 MED FILL — Fosaprepitant Dimeglumine For IV Infusion 150 MG (Base Eq): INTRAVENOUS | Qty: 5 | Status: AC

## 2022-03-30 MED FILL — Dexamethasone Sodium Phosphate Inj 100 MG/10ML: INTRAMUSCULAR | Qty: 1 | Status: AC

## 2022-03-31 ENCOUNTER — Inpatient Hospital Stay: Payer: Medicare HMO

## 2022-03-31 ENCOUNTER — Inpatient Hospital Stay (HOSPITAL_BASED_OUTPATIENT_CLINIC_OR_DEPARTMENT_OTHER): Payer: Medicare HMO | Admitting: Internal Medicine

## 2022-03-31 ENCOUNTER — Other Ambulatory Visit: Payer: Self-pay

## 2022-03-31 ENCOUNTER — Encounter: Payer: Self-pay | Admitting: Internal Medicine

## 2022-03-31 VITALS — BP 115/67 | HR 70 | Temp 97.8°F | Resp 24 | Ht 68.0 in | Wt 165.6 lb

## 2022-03-31 VITALS — BP 130/98 | HR 78 | Temp 98.1°F | Resp 20

## 2022-03-31 DIAGNOSIS — Z803 Family history of malignant neoplasm of breast: Secondary | ICD-10-CM | POA: Diagnosis not present

## 2022-03-31 DIAGNOSIS — Z95828 Presence of other vascular implants and grafts: Secondary | ICD-10-CM

## 2022-03-31 DIAGNOSIS — Z87891 Personal history of nicotine dependence: Secondary | ICD-10-CM | POA: Diagnosis not present

## 2022-03-31 DIAGNOSIS — C7951 Secondary malignant neoplasm of bone: Secondary | ICD-10-CM | POA: Diagnosis not present

## 2022-03-31 DIAGNOSIS — Z5112 Encounter for antineoplastic immunotherapy: Secondary | ICD-10-CM | POA: Diagnosis not present

## 2022-03-31 DIAGNOSIS — C3431 Malignant neoplasm of lower lobe, right bronchus or lung: Secondary | ICD-10-CM

## 2022-03-31 DIAGNOSIS — Z79899 Other long term (current) drug therapy: Secondary | ICD-10-CM | POA: Diagnosis not present

## 2022-03-31 DIAGNOSIS — Z800 Family history of malignant neoplasm of digestive organs: Secondary | ICD-10-CM | POA: Diagnosis not present

## 2022-03-31 DIAGNOSIS — Z8041 Family history of malignant neoplasm of ovary: Secondary | ICD-10-CM | POA: Diagnosis not present

## 2022-03-31 DIAGNOSIS — Z8042 Family history of malignant neoplasm of prostate: Secondary | ICD-10-CM | POA: Diagnosis not present

## 2022-03-31 DIAGNOSIS — Z5111 Encounter for antineoplastic chemotherapy: Secondary | ICD-10-CM

## 2022-03-31 DIAGNOSIS — C771 Secondary and unspecified malignant neoplasm of intrathoracic lymph nodes: Secondary | ICD-10-CM | POA: Diagnosis not present

## 2022-03-31 DIAGNOSIS — C3490 Malignant neoplasm of unspecified part of unspecified bronchus or lung: Secondary | ICD-10-CM | POA: Diagnosis not present

## 2022-03-31 HISTORY — DX: Presence of other vascular implants and grafts: Z95.828

## 2022-03-31 LAB — CMP (CANCER CENTER ONLY)
ALT: 15 U/L (ref 0–44)
AST: 16 U/L (ref 15–41)
Albumin: 4.2 g/dL (ref 3.5–5.0)
Alkaline Phosphatase: 53 U/L (ref 38–126)
Anion gap: 4 — ABNORMAL LOW (ref 5–15)
BUN: 14 mg/dL (ref 8–23)
CO2: 26 mmol/L (ref 22–32)
Calcium: 9.5 mg/dL (ref 8.9–10.3)
Chloride: 107 mmol/L (ref 98–111)
Creatinine: 0.95 mg/dL (ref 0.61–1.24)
GFR, Estimated: >60 mL/min (ref >60)
Glucose, Bld: 108 mg/dL — ABNORMAL HIGH (ref 70–99)
Potassium: 4.3 mmol/L (ref 3.5–5.1)
Sodium: 137 mmol/L (ref 135–145)
Total Bilirubin: 0.5 mg/dL (ref 0.3–1.2)
Total Protein: 6.5 g/dL (ref 6.5–8.1)

## 2022-03-31 LAB — CBC WITH DIFFERENTIAL (CANCER CENTER ONLY)
Abs Immature Granulocytes: 0.01 10*3/uL (ref 0.00–0.07)
Basophils Absolute: 0.1 10*3/uL (ref 0.0–0.1)
Basophils Relative: 1 %
Eosinophils Absolute: 0.1 10*3/uL (ref 0.0–0.5)
Eosinophils Relative: 2 %
HCT: 41.9 % (ref 39.0–52.0)
Hemoglobin: 14.2 g/dL (ref 13.0–17.0)
Immature Granulocytes: 0 %
Lymphocytes Relative: 29 %
Lymphs Abs: 1.4 10*3/uL (ref 0.7–4.0)
MCH: 30.7 pg (ref 26.0–34.0)
MCHC: 33.9 g/dL (ref 30.0–36.0)
MCV: 90.7 fL (ref 80.0–100.0)
Monocytes Absolute: 0.7 10*3/uL (ref 0.1–1.0)
Monocytes Relative: 13 %
Neutro Abs: 2.6 10*3/uL (ref 1.7–7.7)
Neutrophils Relative %: 55 %
Platelet Count: 216 10*3/uL (ref 150–400)
RBC: 4.62 MIL/uL (ref 4.22–5.81)
RDW: 13.8 % (ref 11.5–15.5)
WBC Count: 4.9 10*3/uL (ref 4.0–10.5)
nRBC: 0 % (ref 0.0–0.2)

## 2022-03-31 LAB — TSH: TSH: 2.307 u[IU]/mL (ref 0.350–4.500)

## 2022-03-31 MED ORDER — SODIUM CHLORIDE 0.9 % IV SOLN
10.0000 mg | Freq: Once | INTRAVENOUS | Status: AC
  Administered 2022-03-31: 10 mg via INTRAVENOUS
  Filled 2022-03-31: qty 10

## 2022-03-31 MED ORDER — SODIUM CHLORIDE 0.9 % IV SOLN
1500.0000 mg | Freq: Once | INTRAVENOUS | Status: AC
  Administered 2022-03-31: 1500 mg via INTRAVENOUS
  Filled 2022-03-31: qty 30

## 2022-03-31 MED ORDER — SODIUM CHLORIDE 0.9 % IV SOLN: Freq: Once | INTRAVENOUS | Status: AC

## 2022-03-31 MED ORDER — SODIUM CHLORIDE 0.9% FLUSH
10.0000 mL | INTRAVENOUS | Status: DC | PRN
  Administered 2022-03-31: 10 mL

## 2022-03-31 MED ORDER — SODIUM CHLORIDE 0.9 % IV SOLN
150.0000 mg | Freq: Once | INTRAVENOUS | Status: AC
  Administered 2022-03-31: 150 mg via INTRAVENOUS
  Filled 2022-03-31: qty 150

## 2022-03-31 MED ORDER — SODIUM CHLORIDE 0.9 % IV SOLN
100.0000 mg/m2 | Freq: Once | INTRAVENOUS | Status: AC
  Administered 2022-03-31: 190 mg via INTRAVENOUS
  Filled 2022-03-31: qty 9.5

## 2022-03-31 MED ORDER — SODIUM CHLORIDE 0.9% FLUSH
10.0000 mL | Freq: Once | INTRAVENOUS | Status: AC
  Administered 2022-03-31: 10 mL

## 2022-03-31 MED ORDER — TRILACICLIB DIHYDROCHLORIDE INJECTION 300 MG
240.0000 mg/m2 | Freq: Once | INTRAVENOUS | Status: AC
  Administered 2022-03-31: 465 mg via INTRAVENOUS
  Filled 2022-03-31: qty 31

## 2022-03-31 MED ORDER — PALONOSETRON HCL INJECTION 0.25 MG/5ML
0.2500 mg | Freq: Once | INTRAVENOUS | Status: AC
  Administered 2022-03-31: 0.25 mg via INTRAVENOUS
  Filled 2022-03-31: qty 5

## 2022-03-31 MED ORDER — SODIUM CHLORIDE 0.9 % IV SOLN
475.0000 mg | Freq: Once | INTRAVENOUS | Status: AC
  Administered 2022-03-31: 480 mg via INTRAVENOUS
  Filled 2022-03-31: qty 48

## 2022-03-31 MED ORDER — HEPARIN SOD (PORK) LOCK FLUSH 100 UNIT/ML IV SOLN
500.0000 [IU] | Freq: Once | INTRAVENOUS | Status: AC | PRN
  Administered 2022-03-31: 500 [IU]

## 2022-03-31 MED FILL — Dexamethasone Sodium Phosphate Inj 100 MG/10ML: INTRAMUSCULAR | Qty: 1 | Status: AC

## 2022-03-31 NOTE — Patient Instructions (Signed)
[facility]  Discharge Instructions: Thank you for choosing [facility] to provide your oncology and hematology care.   If you have a lab appointment with the [facility], please go directly to the [facility] and check in at the registration area.   Wear comfortable clothing and clothing appropriate for easy access to any Portacath or PICC line.   We strive to give you quality time with your provider. You may need to reschedule your appointment if you arrive late (15 or more minutes).  Arriving late affects you and other patients whose appointments are after yours.  Also, if you miss three or more appointments without notifying the office, you may be dismissed from the clinic at the provider's discretion.      For prescription refill requests, have your pharmacy contact our office and allow 72 hours for refills to be completed.    Today you received the following chemotherapy and/or immunotherapy agents: Cosela, Etoposide      To help prevent nausea and vomiting after your treatment, we encourage you to take your nausea medication as directed.  BELOW ARE SYMPTOMS THAT SHOULD BE REPORTED IMMEDIATELY: *FEVER GREATER THAN 100.4 F (38 C) OR HIGHER *CHILLS OR SWEATING *NAUSEA AND VOMITING THAT IS NOT CONTROLLED WITH YOUR NAUSEA MEDICATION *UNUSUAL SHORTNESS OF BREATH *UNUSUAL BRUISING OR BLEEDING *URINARY PROBLEMS (pain or burning when urinating, or frequent urination) *BOWEL PROBLEMS (unusual diarrhea, constipation, pain near the anus) TENDERNESS IN MOUTH AND THROAT WITH OR WITHOUT PRESENCE OF ULCERS (sore throat, sores in mouth, or a toothache) UNUSUAL RASH, SWELLING OR PAIN  UNUSUAL VAGINAL DISCHARGE OR ITCHING   Items with * indicate a potential emergency and should be followed up as soon as possible or go to the Emergency Department if any problems should occur.  Please show the CHEMOTHERAPY ALERT CARD or IMMUNOTHERAPY ALERT CARD at  check-in to the Emergency Department and triage nurse.  Should you have questions after your visit or need to cancel or reschedule your appointment, please contact [facility]  Dept: [phone]  and follow the prompts.  Office hours are 8:00 a.m. to 4:30 p.m. Monday - Friday. Please note that voicemails left after 4:00 p.m. may not be returned until the following business day.  We are closed weekends and major holidays. You have access to a nurse at all times for urgent questions. Please call the main number to the clinic Dept: [phone] and follow the prompts.   For any non-urgent questions, you may also contact your provider using MyChart. We now offer e-Visits for anyone 48 and older to request care online for non-urgent symptoms. For details visit [email or web address].   Also download the MyChart app! Go to the app store, search "MyChart", open the app, select [facility], and log in with your MyChart username and password.  Masks are optional in the cancer centers. If you would like for your care team to wear a mask while they are taking care of you, please let them know. You may have one support person who is at least 75 years old accompany you for your appointments.

## 2022-03-31 NOTE — Progress Notes (Signed)
[facility] [phone]   [phone]  OFFICE PROGRESS NOTE  Adam Morning, DO [place] [id number]  DIAGNOSIS: Extensive stage (T1c, N2, M1C) small cell lung cancer presented with right lower lobe lung nodule in addition to right hilar and mediastinal lymphadenopathy and bone metastasis involving thoracic spines as well as the right iliac crest diagnosed in July 2023.  PRIOR THERAPY: None  CURRENT THERAPY: Systemic chemotherapy with carboplatin for AUC of 5 on day 1, etoposide 100 Mg/M2 on days 1, 2 and 3 with Imfinzi 1500 Mg IV on day 1 and Cosela 240 Mg/M2 on the days of the chemotherapy as well as Imfinzi 1500 Mg IV on day 1 every 3 weeks the first 4 cycles followed by maintenance treatment every 4 weeks starting from cycle #5.  First dose was giving 03/10/2022.  The patient status post 1 cycle.  INTERVAL HISTORY: Adam Jordan 76 y.o. male returns to the clinic today for returns to the clinic today for follow-up visit.  The patient tolerated the first cycle of his treatment fairly well with no concerning adverse effects.  He denied having any current chest pain, shortness of breath except with exertion with no cough or hemoptysis.  He has no nausea, vomiting, diarrhea or constipation.  He has no headache or visual changes.  He is here today for evaluation before starting cycle #2 of his treatment.  MEDICAL HISTORY: Past Medical History:  Diagnosis Date   Anxiety    Arthritis    osteoarthrititis- knees and most joints.   COPD (chronic obstructive pulmonary disease) (HCC)    moderate -no regular use of inhalers- rare use of oxygen as sexual activity   Dyspnea    outside in hot weather and also with pollen   Hyperlipidemia    Hypertension    Neuromuscular disorder ([place])    feet   Paroxysmal atrial flutter ([facility]) 01/15/2021   Thoracoabdominal aneurysm (HCC)    s/p FEVAR 4 Vessel TABME 02/19/20 Dr. Katy Apo     ALLERGIES:  is allergic to losartan, atorvastatin calcium, and pollen extract-tree extract [pollen extract].  MEDICATIONS:  Current Outpatient Medications  Medication Sig Dispense Refill   acetaminophen (TYLENOL) 500 MG tablet Take 500 mg by mouth daily as needed for moderate pain or headache.     Alum Hydroxide-Mag Trisilicate (GAVISCON) 54-65.0 MG CHEW Chew 1 tablet by mouth daily as needed (heartburn).     apixaban (ELIQUIS) 5 MG TABS tablet Take 1 tablet (5 mg total) by mouth 2 (two) times daily. Okay to restart this medication on 02/24/2022 180 tablet 3   atorvastatin (LIPITOR) 20 MG tablet Take 20 mg by mouth at bedtime.     clonazePAM (KLONOPIN) 1 MG tablet Take 1 tablet (1 mg total) by mouth 2 (two) times daily as needed for anxiety. 60 tablet 5   fluticasone (FLONASE) 50 MCG/ACT nasal spray Place 1 spray into both nostrils 2 (two) times daily as needed for allergies.     lidocaine-prilocaine (EMLA) cream Apply to the Port-A-Cath site 30-60 minutes before chemotherapy 30 g 0   meloxicam (MOBIC) 15 MG tablet Take 15 mg by mouth daily as needed for pain.     Multiple Vitamin (MULTIVITAMIN WITH MINERALS) TABS tablet Take 1 tablet by mouth daily. 30 tablet 5   Naphazoline HCl (CLEAR EYES OP) Place 1 drop into both eyes daily.     OVER THE COUNTER MEDICATION Take 1-3 capsules by mouth  at bedtime. Oxy-Powder colon cleanse otc supplement/ Unknown strenght     PROAIR HFA 108 (90 BASE) MCG/ACT inhaler Inhale 2 puffs into the lungs every 6 (six) hours as needed for wheezing or shortness of breath.   2   prochlorperazine (COMPAZINE) 10 MG tablet Take 1 tablet (10 mg total) by mouth every 6 (six) hours as needed for nausea or vomiting. 30 tablet 0   sildenafil (REVATIO) 20 MG tablet Take 60 mg by mouth daily as needed (ED).     triamcinolone cream (KENALOG) 0.1 % Apply 1 Application topically daily.     No current facility-administered medications for this visit.    SURGICAL HISTORY:  Past  Surgical History:  Procedure Laterality Date   ABDOMINAL AORTIC ANEURYSM REPAIR  02/06/2020   BRONCHIAL BIOPSY  02/23/2022   Procedure: BRONCHIAL BIOPSIES;  Surgeon: Collene Gobble, MD;  Location: [facility] ENDOSCOPY;  Service: Pulmonary;;   BRONCHIAL BRUSHINGS  02/23/2022   Procedure: BRONCHIAL BRUSHINGS;  Surgeon: Collene Gobble, MD;  Location[facility] ENDOSCOPY;  Service: Pulmonary;;   BRONCHIAL NEEDLE ASPIRATION BIOPSY  02/23/2022   Procedure: BRONCHIAL NEEDLE ASPIRATION BIOPSIES;  Surgeon: Collene Gobble, MD;  Location: [facility] ENDOSCOPY;  Service: Pulmonary;;   BROW LIFT Bilateral 12/11/2021   Procedure: UPPER LID BLEPHAROPLASTY;  Surgeon: Irene Limbo, MD;  Location: [place];  Service: Plastics;  Laterality: Bilateral;   CYST REMOVAL LEG Left 07/03/2021   Procedure: EXCISION CYST LEFT BUTTOCK;  Surgeon: Jovita Kussmaul, MD;  Location: [facility] [place];  Service: General;  Laterality: Left;   EYE SURGERY Bilateral    cataract surgery   FINGER ARTHROPLASTY Left    left thumb-Dr. Amedeo Plenty   INGUINAL HERNIA REPAIR Left 07/03/2021   Procedure: LEFT INGUINAL HERNIA REPAIR WITH MESH;  Surgeon: Jovita Kussmaul, MD;  Location: [place];  Service: General;  Laterality: Left;   INSERTION OF MESH N/A 07/03/2021   Procedure: INSERTION OF MESH X2;  Surgeon: Jovita Kussmaul, MD;  Location: [facility];  Service: General;  Laterality: N/A;   IR IMAGING GUIDED PORT INSERTION  03/12/2022   IR RADIOLOGIST EVAL & MGMT  10/25/2019   KNEE ARTHROSCOPY Left    KNEE SURGERY Left    Bakers cyst x2   SHOULDER ARTHROSCOPY Right    thumb surgery     TONSILLECTOMY     TOTAL KNEE ARTHROPLASTY Left 03/19/2016   Procedure: LEFT TOTAL KNEE ARTHROPLASTY;  Surgeon: Sydnee Cabal, MD;  Location: [facility];  Service: Orthopedics;  Laterality: Left;   TOTAL SHOULDER REPLACEMENT Right    UMBILICAL HERNIA REPAIR N/A 07/03/2021   Procedure: UMBILICAL HERNIA REPAIR WITH MESH;  Surgeon: Jovita Kussmaul, MD;  Location: [facility];  Service: General;   Laterality: N/A;   VASCULAR SURGERY     VASECTOMY     VIDEO BRONCHOSCOPY WITH ENDOBRONCHIAL ULTRASOUND Right 02/23/2022   Procedure: VIDEO BRONCHOSCOPY WITH ENDOBRONCHIAL ULTRASOUND;  Surgeon: Collene Gobble, MD;  Location[facility] END[facility]COPY;  Service: Pulmonary;  Laterality: Right;   VIDEO BRONCHOSCOPY WITH RADIAL ENDOBRONCHIAL ULTRASOUND  02/23/2022   Procedure: VIDEO BRONCHOSCOPY WITH RADIAL ENDOBRONCHIAL ULTRASOUND;  Surgeon: Collene Gobble, MD;  Location: [facility] ENDOSCOPY;  Service: Pulmonary;;    REVIEW OF SYSTEMS:  A comprehensive review of systems was negative except for: Constitutional: positive for fatigue Respiratory: positive for dyspnea on exertion   PHYSICAL EXAMINATION: General appearance: alert, cooperative, fatigued, and no distress Head: Normocephalic, without obvious abnormality, atraumatic Neck: no adenopathy, no JVD, supple, symmetrical, trachea midline, and  thyroid not enlarged, symmetric, no tenderness/mass/nodules Lymph nodes: Cervical, supraclavicular, and axillary nodes normal. Resp: clear to auscultation bilaterally Back: symmetric, no curvature. ROM normal. No CVA tenderness. Cardio: regular rate and rhythm, S1, S2 normal, no murmur, click, rub or gallop GI: soft, non-tender; bowel sounds normal; no masses,  no organomegaly Extremities: extremities normal, atraumatic, no cyanosis or edema  ECOG PERFORMANCE STATUS: 1 - Symptomatic but completely ambulatory  Blood pressure 115/67, pulse 70, temperature 97.8 F (36.6 C), temperature source Oral, resp. rate (!) 24, height 5\' 8"  (1.727 m), weight 165 lb 9.6 oz (75.1 kg), SpO2 97 %.  LABORATORY DATA: Lab Results  Component Value Date   WBC 4.0 03/24/2022   HGB 13.2 03/24/2022   HCT 38.3 (L) 03/24/2022   MCV 89.3 03/24/2022   PLT 93 (L) 03/24/2022      Chemistry      Component Value Date/Time   NA 137 03/24/2022 1314   NA 138 02/03/2021 0944   K 4.0 03/24/2022 1314   CL 104 03/24/2022 1314   CO2 28 03/24/2022  1314   BUN 10 03/24/2022 1314   BUN 13 02/03/2021 0944   CREATININE 0.79 03/24/2022 1314      Component Value Date/Time   CALCIUM 9.5 03/24/2022 1314   ALKPHOS 57 03/24/2022 1314   AST 14 (L) 03/24/2022 1314   ALT 15 03/24/2022 1314   BILITOT 0.5 03/24/2022 1314       RADIOGRAPHIC STUDIES: IR IMAGING GUIDED PORT INSERTION  Result Date: 03/12/2022 INDICATION: 76 year old male referred for port catheter EXAM: IMAGE GUIDED PORT CATHETER MEDICATIONS: None ANESTHESIA/SEDATION: Moderate (conscious) sedation was employed during this procedure. A total of Versed 2.0 mg and Fentanyl 100 mcg was administered intravenously. Moderate Sedation Time: 21 minutes. The patient's level of consciousness and vital signs were monitored continuously by radiology nursing throughout the procedure under my direct supervision. FLUOROSCOPY TIME:  Fluoroscopy Time:   (0 mGy). COMPLICATIONS: None PROCEDURE: Informed written consent was obtained from the patient after a thorough discussion of the procedural risks, benefits and alternatives. All questions were addressed. Maximal Sterile Barrier Technique was utilized including caps, mask, sterile gowns, sterile gloves, sterile drape, hand hygiene and skin antiseptic. A timeout was performed prior to the initiation of the procedure. The procedure, risks, benefits, and alternatives were explained to the patient. Questions regarding the procedure were encouraged and answered. The patient understands and consents to the procedure. Ultrasound survey was performed with images stored and sent to PACs. Right IJ vein documented to be patent. The right neck and chest was prepped with chlorhexidine, and draped in the usual sterile fashion using maximum barrier technique (cap and mask, sterile gown, sterile gloves, large sterile sheet, hand hygiene and cutaneous antiseptic). Local anesthesia was attained by infiltration with 1% lidocaine without epinephrine. Ultrasound demonstrated patency  of the right internal jugular vein, and this was documented with an image. Under real-time ultrasound guidance, this vein was accessed with a 21 gauge micropuncture needle and image documentation was performed. A small dermatotomy was made at the access site with an 11 scalpel. A 0.018" wire was advanced into the SVC and used to estimate the length of the internal catheter. The access needle exchanged for a 75F micropuncture vascular sheath. The 0.018" wire was then removed and a 0.035" wire advanced into the IVC. An appropriate location for the subcutaneous reservoir was selected below the clavicle and an incision was made through the skin and underlying soft tissues. The subcutaneous tissues were then dissected using  a combination of blunt and sharp surgical technique and a pocket was formed. A single lumen power injectable portacatheter was then tunneled through the subcutaneous tissues from the pocket to the dermatotomy and the port reservoir placed within the subcutaneous pocket. The venous access site was then serially dilated and a peel away vascular sheath placed over the wire. The wire was removed and the port catheter advanced into position under fluoroscopic guidance. The catheter tip is positioned in the cavoatrial junction. This was documented with a spot image. The portacatheter was then tested and found to flush and aspirate well. The Huber needle was left in place with saline flush. The pocket was then closed in two layers using first subdermal inverted interrupted absorbable sutures followed by a running subcuticular suture. The epidermis was then sealed with Dermabond. Steri-Strips were placed. The dermatotomy at the venous access site was also seal with Dermabond. Patient tolerated the procedure well and remained hemodynamically stable throughout. No complications encountered and no significant blood loss encountered IMPRESSION: Status post right IJ port catheter placement. Signed, Dulcy Fanny. Nadene Rubins, RPVI [facility] Radiology Electronically Signed   By: Corrie Mckusick D.O.   On: 03/12/2022 14:05   MR BRAIN W WO CONTRAST  Result Date: 03/11/2022 CLINICAL DATA:  Small cell lung cancer, staging EXAM: MRI HEAD WITHOUT AND WITH CONTRAST TECHNIQUE: Multiplanar, multiecho pulse sequences of the brain and surrounding structures were obtained without and with intravenous contrast. CONTRAST:  7.91mL GADAVIST GADOBUTROL 1 MMOL/ML IV SOLN COMPARISON:  None Available. FINDINGS: Brain: No restricted diffusion to suggest acute or subacute infarct. No acute hemorrhage, mass, mass effect, midline shift. No hydrocephalus or extra-axial collection. Confluent and scattered T2 hyperintense signal in the periventricular white matter, likely the sequela of mild-to-moderate chronic small vessel ischemic disease. Remote lacunar infarcts in the bilateral cerebellar hemispheres and external capsules. No abnormal parenchymal or meningeal enhancement. Vascular: Normal arterial flow voids. Normal arterial and venous enhancement Skull and upper cervical spine: Normal marrow signal. Sinuses/Orbits: Mucosal thickening in the left sphenoid sinus. Status post bilateral lens replacements. Other: The mastoids are well aerated. IMPRESSION: No acute intracranial process. No evidence of metastatic disease in the brain. Electronically Signed   By: Merilyn Baba M.D.   On: 03/11/2022 21:16   NM PET Image Initial (PI) Skull Base To Thigh  Result Date: 03/04/2022 CLINICAL DATA:  Initial treatment strategy for small cell lung cancer. EXAM: NUCLEAR MEDICINE PET SKULL BASE TO THIGH TECHNIQUE: 8.4 mCi F-18 FDG was injected intravenously. Full-ring PET imaging was performed from the skull base to thigh after the radiotracer. CT data was obtained and used for attenuation correction and anatomic localization. Fasting blood glucose: 111 mg/dl COMPARISON:  CT 02/08/2022 FINDINGS: Mediastinal blood  pool activity: SUV max 2.19 Liver activity: SUV max NA NECK: No hypermetabolic lymph nodes in the neck. Incidental CT findings: none CHEST: Right lower lobe lung nodule measures 0.8 cm within SUV max of 4.98, image 48/7. At a slightly more caudal level there is a second lung lesion measuring 2.3 by 1.0 cm with SUV max of 5.29, image 54/7. Low right paratracheal lymph node measures 0.9 cm with SUV max of 4.09, image 77/4. Subcarinal lymph node measures 1.9 cm with SUV max of 12.63, image 82/4. Right hilar nodal mass measures 2 x 3.3 cm with SUV max of 14.43, image 88/4. Subtle focus of mild increased uptake within the periphery of the left upper lobe has an SUV max of 2.3.  There is a tiny nodule measuring approximately 4 mm on the corresponding CT images, image 32/7. Incidental CT findings: Moderate to severe centrilobular and paraseptal emphysema. Aortic atherosclerosis and coronary artery calcifications. No pericardial effusion. ABDOMEN/PELVIS: There is equivocal uptake within the left adrenal gland within SUV max of 2.75, image 121 of the fused images. Subtle thickening of the adrenal gland is noted which measures up to 9 mm, image 122/4. No abnormal tracer activity within the liver, pancreas, or spleen. No signs of abdominopelvic nodal metastasis. Incidental CT findings: Status post stent graft repair of abdominal aortic aneurysm. SKELETON: FDG avid bone metastases identified involving the L4 vertebral body. The SUV max is equal to 17.43, image 146 of the axial fused images. No corresponding lytic or sclerotic bone lesions identified on the CT images. FDG avid lesion within the right iliac bone has an SUV max of 5.97, image 164 of the fused axial images. No corresponding lytic or sclerotic bone lesion identified on the CT images. Incidental CT findings: none IMPRESSION: 1. There are 2 lesions within the right lower lobe which are FDG avid and compatible with primary bronchogenic carcinoma. 2. FDG avid ipsilateral  hilar and mediastinal nodal metastasis and subcarinal nodal metastasis. 3. FDG avid bone lesions are noted within the right iliac bone and L4 vertebral body without corresponding lytic or sclerotic changes on the CT images. Findings are compatible with osseous metastasis. 4. Mild thickening with mild increased FDG uptake localizing to the left adrenal gland. Equivocal for adrenal metastasis. 5. Aortic Atherosclerosis (ICD10-I70.0) and Emphysema (ICD10-J43.9). 6. Coronary artery calcifications 7. Status post stent graft repair of abdominal aortic aneurysm. Electronically Signed   By: Kerby Moors M.D.   On: 03/04/2022 08:54    ASSESSMENT AND PLAN: This is a very pleasant 76 years old white male with extensive stage (T1c, N2, M1C) small cell lung cancer presented with right lower lobe lung nodule in addition to right hilar and mediastinal lymphadenopathy and bone metastasis involving thoracic spines as well as the right iliac crest diagnosed in July 2023. The patient started systemic chemotherapy with carboplatin for AUC of 5 on day 1, etoposide 100 Mg/M2 on days 1, 2 and 3 as well as Cosela 240 Mg/M2 on the days of the chemotherapy and Imfinzi 1500 Mg IV every 3 weeks with the induction treatment.  He is status post 1 cycle. He tolerated the first cycle of his treatment well with no concerning adverse effect except for mild fatigue. I recommended for him to proceed with cycle #2 today as planned. I will see him back for follow-up visit in 3 weeks for evaluation with repeat CT scan of the chest, abdomen and pelvis for restaging of his disease. The patient was advised to call immediately if he has any other concerning symptoms in the interval. The patient voices understanding of current disease status and treatment options and is in agreement with the current care plan.  All questions were answered. The patient knows to call the clinic with any problems, questions or concerns. We can certainly see the  patient much sooner if necessary.  The total time spent in the appointment was 20 minutes.  Disclaimer: This note was dictated with voice recognition software. Similar sounding words can inadvertently be transcribed and may not be corrected upon review.

## 2022-04-01 ENCOUNTER — Inpatient Hospital Stay: Payer: Medicare HMO

## 2022-04-01 VITALS — BP 155/60 | HR 84 | Temp 97.7°F | Resp 15 | Ht 68.0 in | Wt 165.0 lb

## 2022-04-01 DIAGNOSIS — Z8041 Family history of malignant neoplasm of ovary: Secondary | ICD-10-CM | POA: Diagnosis not present

## 2022-04-01 DIAGNOSIS — Z8042 Family history of malignant neoplasm of prostate: Secondary | ICD-10-CM | POA: Diagnosis not present

## 2022-04-01 DIAGNOSIS — C7951 Secondary malignant neoplasm of bone: Secondary | ICD-10-CM | POA: Diagnosis not present

## 2022-04-01 DIAGNOSIS — Z5111 Encounter for antineoplastic chemotherapy: Secondary | ICD-10-CM | POA: Diagnosis not present

## 2022-04-01 DIAGNOSIS — Z5112 Encounter for antineoplastic immunotherapy: Secondary | ICD-10-CM | POA: Diagnosis not present

## 2022-04-01 DIAGNOSIS — C771 Secondary and unspecified malignant neoplasm of intrathoracic lymph nodes: Secondary | ICD-10-CM | POA: Diagnosis not present

## 2022-04-01 DIAGNOSIS — C3431 Malignant neoplasm of lower lobe, right bronchus or lung: Secondary | ICD-10-CM | POA: Diagnosis not present

## 2022-04-01 DIAGNOSIS — Z79899 Other long term (current) drug therapy: Secondary | ICD-10-CM | POA: Diagnosis not present

## 2022-04-01 DIAGNOSIS — Z803 Family history of malignant neoplasm of breast: Secondary | ICD-10-CM | POA: Diagnosis not present

## 2022-04-01 DIAGNOSIS — Z800 Family history of malignant neoplasm of digestive organs: Secondary | ICD-10-CM | POA: Diagnosis not present

## 2022-04-01 DIAGNOSIS — Z87891 Personal history of nicotine dependence: Secondary | ICD-10-CM | POA: Diagnosis not present

## 2022-04-01 MED ORDER — TRILACICLIB DIHYDROCHLORIDE INJECTION 300 MG
240.0000 mg/m2 | Freq: Once | INTRAVENOUS | Status: AC
  Administered 2022-04-01: 465 mg via INTRAVENOUS
  Filled 2022-04-01: qty 31

## 2022-04-01 MED ORDER — SODIUM CHLORIDE 0.9 % IV SOLN
100.0000 mg/m2 | Freq: Once | INTRAVENOUS | Status: AC
  Administered 2022-04-01: 190 mg via INTRAVENOUS
  Filled 2022-04-01: qty 9.5

## 2022-04-01 MED ORDER — SODIUM CHLORIDE 0.9 % IV SOLN: Freq: Once | INTRAVENOUS | Status: AC

## 2022-04-01 MED ORDER — HEPARIN SOD (PORK) LOCK FLUSH 100 UNIT/ML IV SOLN
500.0000 [IU] | Freq: Once | INTRAVENOUS | Status: AC | PRN
  Administered 2022-04-01: 500 [IU]

## 2022-04-01 MED ORDER — SODIUM CHLORIDE 0.9% FLUSH
10.0000 mL | INTRAVENOUS | Status: DC | PRN
  Administered 2022-04-01: 10 mL

## 2022-04-01 MED ORDER — SODIUM CHLORIDE 0.9 % IV SOLN
10.0000 mg | Freq: Once | INTRAVENOUS | Status: AC
  Administered 2022-04-01: 10 mg via INTRAVENOUS
  Filled 2022-04-01: qty 10

## 2022-04-01 MED FILL — Dexamethasone Sodium Phosphate Inj 100 MG/10ML: INTRAMUSCULAR | Qty: 1 | Status: AC

## 2022-04-01 NOTE — Patient Instructions (Signed)
[facility] MEDICAL ONCOLOGY  Discharge Instructions: Thank you for choosing [facility] to provide your oncology and hematology care.   If you have a lab appointment with the Cancer Center, please go directly to the Cancer Center and check in at the registration area.   Wear comfortable clothing and clothing appropriate for easy access to any Portacath or PICC line.   We strive to give you quality time with your provider. You may need to reschedule your appointment if you arrive late (15 or more minutes).  Arriving late affects you and other patients whose appointments are after yours.  Also, if you miss three or more appointments without notifying the office, you may be dismissed from the clinic at the provider's discretion.      For prescription refill requests, have your pharmacy contact our office and allow 72 hours for refills to be completed.    Today you received the following chemotherapy and/or immunotherapy agents: Etoposide      To help prevent nausea and vomiting after your treatment, we encourage you to take your nausea medication as directed.  BELOW ARE SYMPTOMS THAT SHOULD BE REPORTED IMMEDIATELY: *FEVER GREATER THAN 100.4 F (38 C) OR HIGHER *CHILLS OR SWEATING *NAUSEA AND VOMITING THAT IS NOT CONTROLLED WITH YOUR NAUSEA MEDICATION *UNUSUAL SHORTNESS OF BREATH *UNUSUAL BRUISING OR BLEEDING *URINARY PROBLEMS (pain or burning when urinating, or frequent urination) *BOWEL PROBLEMS (unusual diarrhea, constipation, pain near the anus) TENDERNESS IN MOUTH AND THROAT WITH OR WITHOUT PRESENCE OF ULCERS (sore throat, sores in mouth, or a toothache) UNUSUAL RASH, SWELLING OR PAIN  UNUSUAL VAGINAL DISCHARGE OR ITCHING   Items with * indicate a potential emergency and should be followed up as soon as possible or go to the Emergency Department if any problems should occur.  Please show the CHEMOTHERAPY ALERT CARD or IMMUNOTHERAPY ALERT CARD at check-in to  the Emergency Department and triage nurse.  Should you have questions after your visit or need to cancel or reschedule your appointment, please contact [facility] MEDICAL ONCOLOGY  Dept: [phone]  and follow the prompts.  Office hours are 8:00 a.m. to 4:30 p.m. Monday - Friday. Please note that voicemails left after 4:00 p.m. may not be returned until the following business day.  We are closed weekends and major holidays. You have access to a nurse at all times for urgent questions. Please call the main number to the clinic Dept: [phone] and follow the prompts.   For any non-urgent questions, you may also contact your provider using MyChart. We now offer e-Visits for anyone 18 and older to request care online for non-urgent symptoms. For details visit [email or web address].   Also download the MyChart app! Go to the app store, search "MyChart", open the app, select , and log in with your MyChart username and password.  Masks are optional in the cancer centers. If you would like for your care team to wear a mask while they are taking care of you, please let them know. You may have one support person who is at least 75 years old accompany you for your appointments. 

## 2022-04-02 ENCOUNTER — Inpatient Hospital Stay: Payer: Medicare HMO | Attending: Internal Medicine

## 2022-04-02 VITALS — BP 142/72 | HR 72 | Temp 98.1°F | Resp 16

## 2022-04-02 DIAGNOSIS — C3431 Malignant neoplasm of lower lobe, right bronchus or lung: Secondary | ICD-10-CM | POA: Diagnosis not present

## 2022-04-02 DIAGNOSIS — Z5111 Encounter for antineoplastic chemotherapy: Secondary | ICD-10-CM | POA: Diagnosis not present

## 2022-04-02 DIAGNOSIS — C7951 Secondary malignant neoplasm of bone: Secondary | ICD-10-CM | POA: Diagnosis not present

## 2022-04-02 DIAGNOSIS — Z79899 Other long term (current) drug therapy: Secondary | ICD-10-CM | POA: Insufficient documentation

## 2022-04-02 MED ORDER — SODIUM CHLORIDE 0.9 % IV SOLN: Freq: Once | INTRAVENOUS | Status: AC

## 2022-04-02 MED ORDER — SODIUM CHLORIDE 0.9% FLUSH
10.0000 mL | INTRAVENOUS | Status: DC | PRN
  Administered 2022-04-02: 10 mL

## 2022-04-02 MED ORDER — HEPARIN SOD (PORK) LOCK FLUSH 100 UNIT/ML IV SOLN
500.0000 [IU] | Freq: Once | INTRAVENOUS | Status: AC | PRN
  Administered 2022-04-02: 500 [IU]

## 2022-04-02 MED ORDER — SODIUM CHLORIDE 0.9 % IV SOLN
100.0000 mg/m2 | Freq: Once | INTRAVENOUS | Status: AC
  Administered 2022-04-02: 190 mg via INTRAVENOUS
  Filled 2022-04-02: qty 9.5

## 2022-04-02 MED ORDER — SODIUM CHLORIDE 0.9 % IV SOLN
10.0000 mg | Freq: Once | INTRAVENOUS | Status: AC
  Administered 2022-04-02: 10 mg via INTRAVENOUS
  Filled 2022-04-02: qty 10

## 2022-04-02 MED ORDER — TRILACICLIB DIHYDROCHLORIDE INJECTION 300 MG
240.0000 mg/m2 | Freq: Once | INTRAVENOUS | Status: AC
  Administered 2022-04-02: 465 mg via INTRAVENOUS
  Filled 2022-04-02: qty 31

## 2022-04-02 NOTE — Patient Instructions (Signed)
[facility]COL[facility]  Discharge Instructions: Thank you for choosing [facility] to provide your oncology and hematology care.   If you have a lab appointment with the Cancer Center, please go directly to the Cancer Center and check in at the registration area.   Wear comfortable clothing and clothing appropriate for easy access to any Portacath or PICC line.   We strive to give you quality time with your provider. You may need to reschedule your appointment if you arrive late (15 or more minutes).  Arriving late affects you and other patients whose appointments are after yours.  Also, if you miss three or more appointments without notifying the office, you may be dismissed from the clinic at the provider's discretion.      For prescription refill requests, have your pharmacy contact our office and allow 72 hours for refills to be completed.    Today you received the following chemotherapy and/or immunotherapy agents; cosela & etoposide      To help prevent nausea and vomiting after your treatment, we encourage you to take your nausea medication as directed.  BELOW ARE SYMPTOMS THAT SHOULD BE REPORTED IMMEDIATELY: *FEVER GREATER THAN 100.4 F (38 C) OR HIGHER *CHILLS OR SWEATING *NAUSEA AND VOMITING THAT IS NOT CONTROLLED WITH YOUR NAUSEA MEDICATION *UNUSUAL SHORTNESS OF BREATH *UNUSUAL BRUISING OR BLEEDING *URINARY PROBLEMS (pain or burning when urinating, or frequent urination) *BOWEL PROBLEMS (unusual diarrhea, constipation, pain near the anus) TENDERNESS IN MOUTH AND THROAT WITH OR WITHOUT PRESENCE OF ULCERS (sore throat, sores in mouth, or a toothache) UNUSUAL RASH, SWELLING OR PAIN  UNUSUAL VAGINAL DISCHARGE OR ITCHING   Items with * indicate a potential emergency and should be followed up as soon as possible or go to the Emergency Department if any problems should occur.  Please show the CHEMOTHERAPY ALERT CARD or IMMUNOTHERAPY ALERT CARD at  check-in to the Emergency Department and triage nurse.  Should you have questions after your visit or need to cancel or reschedule your appointment, please contact [facility]  Dept: [phone]  and follow the prompts.  Office hours are 8:00 a.m. to 4:30 p.m. Monday - Friday. Please note that voicemails left after 4:00 p.m. may not be returned until the following business day.  We are closed weekends and major holidays. You have access to a nurse at all times for urgent questions. Please call the main number to the clinic Dept: [phone] and follow the prompts.   For any non-urgent questions, you may also contact your provider using MyChart. We now offer e-Visits for anyone 18 and older to request care online for non-urgent symptoms. For details visit [email or web address].   Also download the MyChart app! Go to the app store, search "MyChart", open the app, select [facility], and log in with your MyChart username and password.  Masks are optional in the cancer centers. If you would like for your care team to wear a mask while they are taking care of you, please let them know. You may have one support person who is at least 75 years old accompany you for your appointments. 

## 2022-04-06 ENCOUNTER — Encounter: Payer: Self-pay | Admitting: Emergency Medicine

## 2022-04-06 ENCOUNTER — Ambulatory Visit (INDEPENDENT_AMBULATORY_CARE_PROVIDER_SITE_OTHER): Payer: Medicare HMO | Admitting: Emergency Medicine

## 2022-04-06 DIAGNOSIS — J449 Chronic obstructive pulmonary disease, unspecified: Secondary | ICD-10-CM

## 2022-04-06 DIAGNOSIS — C3431 Malignant neoplasm of lower lobe, right bronchus or lung: Secondary | ICD-10-CM

## 2022-04-06 MED ORDER — STIOLTO RESPIMAT 2.5-2.5 MCG/ACT IN AERS: 2.0000 | INHALATION_SPRAY | Freq: Every day | RESPIRATORY_TRACT | 0 refills | Status: DC

## 2022-04-06 NOTE — Progress Notes (Signed)
Subjective:    Patient ID: Adam Jordan, male    DOB: 1945/11/05, 76 y.o.   MRN: [id number]  HPI  ROV 02/17/22 --follow-up visit 76 year old former smoker with COPD and thoracoabdominal aneurysm treated surgically 01/2019 at [facility].  I saw him for pulmonary nodules that were noted on CTA chest/abdomen/pelvis in February, specifically a 9 mm superior right lower lobe nodule, new 8 mm right lower lobe nodule, stable 6 mm left upper lobe nodule and a patchy nodular opacity in the right lower lobe.  Deemed to be moderate risk.  We planned for a repeat CT chest at the 12-month mark.  CT chest 02/08/2022 reviewed by me, shows biapical scarring, a spiculated 8 mm right lower lobe nodule, lateral right lower lobe nodule 1.2 x 2.8 cm.  There is right hilar and subcarinal adenopathy.  ROV 04/06/22 --76 year old man with COPD whom I saw for pulmonary nodular disease that was detected on a CTA done to evaluate thoracoabdominal aortic aneurysm.  He underwent bronchoscopy 02/23/2022 and his transbronchial biopsies, nodal biopsies were consistent with small cell lung cancer.  Has been seen by Dr. Julien Nordmann and oncology. He has some SOB in the heat and humidity. He has sinus drainage, some associated cough. Rare albuterol use - does seem to help him.   PET 03/03/22 reviewed by me, shows hypermetabolism in the LAD and nodules as well as R iliac and T4 lesions.    Review of Systems As per HPI   Past Medical History:  Diagnosis Date   Anxiety    Arthritis    osteoarthrititis- knees and most joints.   COPD (chronic obstructive pulmonary disease) (HCC)    moderate -no regular use of inhalers- rare use of oxygen as sexual activity   Dyspnea    outside in hot weather and also with pollen   Hyperlipidemia    Hypertension    Neuromuscular disorder (HCC)    feet   Paroxysmal atrial flutter ([place]) 01/15/2021   Thoracoabdominal aneurysm [place])    s/p FEVAR 4 Vessel TABME 02/19/20 Dr. Katy Apo     Family History   Problem Relation Age of Onset   Ovarian cancer Mother    Breast cancer Mother    Macular degeneration Mother    Heart attack Father      Social History   Socioeconomic History   Marital status: Married    Spouse name: Not on file   Number of children: Not on file   Years of education: Not on file   Highest education level: Not on file  Occupational History   Not on file  Tobacco Use   Smoking status: Former    Years: 50.00    Types: Cigarettes    Quit date: 2020    Years since quitting: 3.6   Smokeless tobacco: Never   Tobacco comments:    Quit 2 months ago 02/17/22 [facility]   Vaping Use   Vaping Use: Never used  Substance and Sexual Activity   Alcohol use: Not Currently   Drug use: No   Sexual activity: Yes  Other Topics Concern   Not on file  Social History Narrative   Not on file   Social Determinants of Health   Financial Resource Strain: Not on file  Food Insecurity: Not on file  Transportation Needs: Not on file  Physical Activity: Not on file  Stress: Not on file  Social Connections: Not on file  Intimate Partner Violence: Not on file    From [place], then  to [employer], then [employer] (medical) 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     Alum Hydroxide-Mag Trisilicate (GAVISCON) 02-40.9 MG CHEW Chew 1 tablet by mouth daily as needed (heartburn).     apixaban (ELIQUIS) 5 MG TABS tablet Take 1 tablet (5 mg total) by mouth 2 (two) times daily. Okay to restart this medication on 02/24/2022 180 tablet 3   atorvastatin (LIPITOR) 20 MG tablet Take 20 mg by mouth at  bedtime.     clonazePAM (KLONOPIN) 1 MG tablet Take 1 tablet (1 mg total) by mouth 2 (two) times daily as needed for anxiety. 60 tablet 5   fluticasone (FLONASE) 50 MCG/ACT nasal spray Place 1 spray into both nostrils 2 (two) times daily as needed for allergies.     lidocaine-prilocaine (EMLA) cream Apply to the Port-A-Cath site 30-60 minutes before chemotherapy 30 g 0   meloxicam (MOBIC) 15 MG tablet Take 15 mg by mouth daily as needed for pain.     Multiple Vitamin (MULTIVITAMIN WITH MINERALS) TABS tablet Take 1 tablet by mouth daily. 30 tablet 5   Naphazoline HCl (CLEAR EYES OP) Place 1 drop into both eyes daily.     OVER THE COUNTER MEDICATION Take 1-3 capsules by mouth at bedtime. Oxy-Powder colon cleanse otc supplement/ Unknown strenght     PROAIR HFA 108 (90 BASE) MCG/ACT inhaler Inhale 2 puffs into the lungs every 6 (six) hours as needed for wheezing or shortness of breath.   2   prochlorperazine (COMPAZINE) 10 MG tablet Take 1 tablet (10 mg total) by mouth every 6 (six) hours as needed for nausea or vomiting. 30 tablet 0   sildenafil (REVATIO) 20 MG tablet Take 60 mg by mouth daily as needed (ED).     triamcinolone cream (KENALOG) 0.1 % Apply 1 Application topically daily.     No facility-administered medications prior to visit.         Objective:   Physical Exam Vitals:   04/06/22 1119  BP: 130/70  Pulse: 71  Temp: 98.3 F (36.8 C)  TempSrc: Oral  SpO2: 96%  Weight: 165 lb 8 oz (75.1 kg)  Height: 5\' 8"  (1.727 m)   Gen: Pleasant, well-nourished, in no distress,  normal affect  ENT: No lesions,  mouth clear,  oropharynx clear, no postnasal drip  Neck: No JVD, no stridor  Lungs: No use of accessory muscles, no crackles or wheezing on normal respiration, no wheeze on forced expiration  Cardiovascular: RRR, heart sounds normal, no murmur or gallops, no peripheral edema  Musculoskeletal: No deformities, no cyanosis or clubbing  Neuro: alert, awake, non focal  Skin:  Warm, no lesions or rash      Assessment & Plan:   COPD, mild (HCC) Overall pretty good functional capacity, limited some by his arthritis in his knees.  Unclear how much his breathing is limiting him but he is willing to do a trial of BD to see if he gets benefit.  We will try Stiolto for several weeks.  If he does benefit then we will continue going forward.  He can keep albuterol available to use if needed.  Small cell carcinoma of lower lobe of right lung [facility]) Following with Dr. Julien Nordmann.  He is receiving systemic chemotherapy.  Question whether he may also get XRT given he has focal bony disease.   Baltazar Apo, MD, PhD 04/06/2022, 11:51 AM [facility] [phone] or if no answer before 7:00PM call [phone] For any issues after 7:00PM please call eLink [phone]

## 2022-04-06 NOTE — Addendum Note (Signed)
Addended by: Gavin Potters R on: 04/06/2022 12:04 PM   Modules accepted: Orders

## 2022-04-06 NOTE — Assessment & Plan Note (Addendum)
Overall pretty good functional capacity, limited some by his arthritis in his knees.  Unclear how much his breathing is limiting him but he is willing to do a trial of BD to see if he gets benefit.  We will try Stiolto for several weeks.  If he does benefit then we will continue going forward.  He can keep albuterol available to use if needed.

## 2022-04-06 NOTE — Patient Instructions (Addendum)
We will try starting Stiolto Respimat, 2 puffs once daily.  Take this medication for several weeks to determine whether it helps your breathing.  If so please call us and let our office know so we can send a prescription for you to your pharmacy. Keep albuterol available to use 2 puffs if needed for shortness of breath, chest tightness, wheezing. Follow with Dr. Julien Nordmann as planned Follow with Dr. Lamonte Sakai in 12 months or sooner if you have any problems.

## 2022-04-06 NOTE — Assessment & Plan Note (Signed)
Following with Dr. Julien Nordmann.  He is receiving systemic chemotherapy.  Question whether he may also get XRT given he has focal bony disease.

## 2022-04-07 ENCOUNTER — Inpatient Hospital Stay: Payer: Medicare HMO

## 2022-04-07 DIAGNOSIS — C7951 Secondary malignant neoplasm of bone: Secondary | ICD-10-CM | POA: Diagnosis not present

## 2022-04-07 DIAGNOSIS — C3431 Malignant neoplasm of lower lobe, right bronchus or lung: Secondary | ICD-10-CM | POA: Diagnosis not present

## 2022-04-07 DIAGNOSIS — Z79899 Other long term (current) drug therapy: Secondary | ICD-10-CM | POA: Diagnosis not present

## 2022-04-07 DIAGNOSIS — Z5111 Encounter for antineoplastic chemotherapy: Secondary | ICD-10-CM | POA: Diagnosis not present

## 2022-04-07 DIAGNOSIS — Z95828 Presence of other vascular implants and grafts: Secondary | ICD-10-CM

## 2022-04-07 LAB — CMP (CANCER CENTER ONLY)
ALT: 17 U/L (ref 0–44)
AST: 15 U/L (ref 15–41)
Albumin: 3.9 g/dL (ref 3.5–5.0)
Alkaline Phosphatase: 49 U/L (ref 38–126)
Anion gap: 5 (ref 5–15)
BUN: 24 mg/dL — ABNORMAL HIGH (ref 8–23)
CO2: 25 mmol/L (ref 22–32)
Calcium: 9.2 mg/dL (ref 8.9–10.3)
Chloride: 109 mmol/L (ref 98–111)
Creatinine: 0.8 mg/dL (ref 0.61–1.24)
GFR, Estimated: >60 mL/min (ref >60)
Glucose, Bld: 110 mg/dL — ABNORMAL HIGH (ref 70–99)
Potassium: 4.5 mmol/L (ref 3.5–5.1)
Sodium: 139 mmol/L (ref 135–145)
Total Bilirubin: 0.5 mg/dL (ref 0.3–1.2)
Total Protein: 6.9 g/dL (ref 6.5–8.1)

## 2022-04-07 LAB — CBC WITH DIFFERENTIAL (CANCER CENTER ONLY)
Abs Immature Granulocytes: 0.11 10*3/uL — ABNORMAL HIGH (ref 0.00–0.07)
Basophils Absolute: 0.1 10*3/uL (ref 0.0–0.1)
Basophils Relative: 1 %
Eosinophils Absolute: 0.1 10*3/uL (ref 0.0–0.5)
Eosinophils Relative: 1 %
HCT: 38.4 % — ABNORMAL LOW (ref 39.0–52.0)
Hemoglobin: 13.2 g/dL (ref 13.0–17.0)
Immature Granulocytes: 1 %
Lymphocytes Relative: 22 %
Lymphs Abs: 1.8 10*3/uL (ref 0.7–4.0)
MCH: 30.8 pg (ref 26.0–34.0)
MCHC: 34.4 g/dL (ref 30.0–36.0)
MCV: 89.7 fL (ref 80.0–100.0)
Monocytes Absolute: 0.2 10*3/uL (ref 0.1–1.0)
Monocytes Relative: 3 %
Neutro Abs: 5.8 10*3/uL (ref 1.7–7.7)
Neutrophils Relative %: 72 %
Platelet Count: 176 10*3/uL (ref 150–400)
RBC: 4.28 MIL/uL (ref 4.22–5.81)
RDW: 13.6 % (ref 11.5–15.5)
WBC Count: 8 10*3/uL (ref 4.0–10.5)
nRBC: 0 % (ref 0.0–0.2)

## 2022-04-07 MED ORDER — SODIUM CHLORIDE 0.9% FLUSH
10.0000 mL | Freq: Once | INTRAVENOUS | Status: AC
  Administered 2022-04-07: 10 mL

## 2022-04-07 MED ORDER — HEPARIN SOD (PORK) LOCK FLUSH 100 UNIT/ML IV SOLN
500.0000 [IU] | Freq: Once | INTRAVENOUS | Status: AC
  Administered 2022-04-07: 500 [IU]

## 2022-04-14 ENCOUNTER — Inpatient Hospital Stay: Payer: Medicare HMO

## 2022-04-14 ENCOUNTER — Other Ambulatory Visit: Payer: Self-pay

## 2022-04-14 DIAGNOSIS — Z79899 Other long term (current) drug therapy: Secondary | ICD-10-CM | POA: Diagnosis not present

## 2022-04-14 DIAGNOSIS — Z5111 Encounter for antineoplastic chemotherapy: Secondary | ICD-10-CM | POA: Diagnosis not present

## 2022-04-14 DIAGNOSIS — Z95828 Presence of other vascular implants and grafts: Secondary | ICD-10-CM

## 2022-04-14 DIAGNOSIS — C3431 Malignant neoplasm of lower lobe, right bronchus or lung: Secondary | ICD-10-CM

## 2022-04-14 DIAGNOSIS — C7951 Secondary malignant neoplasm of bone: Secondary | ICD-10-CM | POA: Diagnosis not present

## 2022-04-14 LAB — CBC WITH DIFFERENTIAL (CANCER CENTER ONLY)
Abs Immature Granulocytes: 0.02 10*3/uL (ref 0.00–0.07)
Basophils Absolute: 0.1 10*3/uL (ref 0.0–0.1)
Basophils Relative: 2 %
Eosinophils Absolute: 0.1 10*3/uL (ref 0.0–0.5)
Eosinophils Relative: 1 %
HCT: 36.3 % — ABNORMAL LOW (ref 39.0–52.0)
Hemoglobin: 12.4 g/dL — ABNORMAL LOW (ref 13.0–17.0)
Immature Granulocytes: 0 %
Lymphocytes Relative: 26 %
Lymphs Abs: 1.2 10*3/uL (ref 0.7–4.0)
MCH: 31.3 pg (ref 26.0–34.0)
MCHC: 34.2 g/dL (ref 30.0–36.0)
MCV: 91.7 fL (ref 80.0–100.0)
Monocytes Absolute: 0.6 10*3/uL (ref 0.1–1.0)
Monocytes Relative: 13 %
Neutro Abs: 2.8 10*3/uL (ref 1.7–7.7)
Neutrophils Relative %: 58 %
Platelet Count: 121 10*3/uL — ABNORMAL LOW (ref 150–400)
RBC: 3.96 MIL/uL — ABNORMAL LOW (ref 4.22–5.81)
RDW: 14 % (ref 11.5–15.5)
WBC Count: 4.7 10*3/uL (ref 4.0–10.5)
nRBC: 0 % (ref 0.0–0.2)

## 2022-04-14 LAB — CMP (CANCER CENTER ONLY)
ALT: 16 U/L (ref 0–44)
AST: 16 U/L (ref 15–41)
Albumin: 3.8 g/dL (ref 3.5–5.0)
Alkaline Phosphatase: 48 U/L (ref 38–126)
Anion gap: 6 (ref 5–15)
BUN: 16 mg/dL (ref 8–23)
CO2: 27 mmol/L (ref 22–32)
Calcium: 8.9 mg/dL (ref 8.9–10.3)
Chloride: 106 mmol/L (ref 98–111)
Creatinine: 0.95 mg/dL (ref 0.61–1.24)
GFR, Estimated: >60 mL/min (ref >60)
Glucose, Bld: 114 mg/dL — ABNORMAL HIGH (ref 70–99)
Potassium: 4.2 mmol/L (ref 3.5–5.1)
Sodium: 139 mmol/L (ref 135–145)
Total Bilirubin: 0.4 mg/dL (ref 0.3–1.2)
Total Protein: 6.4 g/dL — ABNORMAL LOW (ref 6.5–8.1)

## 2022-04-14 MED ORDER — SODIUM CHLORIDE 0.9% FLUSH
10.0000 mL | Freq: Once | INTRAVENOUS | Status: AC
  Administered 2022-04-14: 10 mL

## 2022-04-14 MED ORDER — HEPARIN SOD (PORK) LOCK FLUSH 100 UNIT/ML IV SOLN
500.0000 [IU] | Freq: Once | INTRAVENOUS | Status: AC
  Administered 2022-04-14: 500 [IU]

## 2022-04-14 NOTE — Patient Instructions (Signed)

## 2022-04-19 ENCOUNTER — Ambulatory Visit (HOSPITAL_COMMUNITY)
Admission: RE | Admit: 2022-04-19 | Discharge: 2022-04-19 | Disposition: A | Discharge status: Home / Self Care | Payer: Medicare HMO | Source: Ambulatory Visit | Attending: Internal Medicine | Admitting: Internal Medicine

## 2022-04-19 DIAGNOSIS — K5730 Diverticulosis of large intestine without perforation or abscess without bleeding: Secondary | ICD-10-CM | POA: Diagnosis not present

## 2022-04-19 DIAGNOSIS — C3490 Malignant neoplasm of unspecified part of unspecified bronchus or lung: Secondary | ICD-10-CM | POA: Diagnosis not present

## 2022-04-19 DIAGNOSIS — J432 Centrilobular emphysema: Secondary | ICD-10-CM | POA: Diagnosis not present

## 2022-04-19 MED ORDER — SODIUM CHLORIDE (PF) 0.9 % IJ SOLN
INTRAMUSCULAR | Status: AC
  Filled 2022-04-19: qty 50

## 2022-04-19 MED ORDER — IOHEXOL 300 MG/ML  SOLN
100.0000 mL | Freq: Once | INTRAMUSCULAR | Status: AC | PRN
  Administered 2022-04-19: 100 mL via INTRAVENOUS

## 2022-04-19 NOTE — Progress Notes (Unsigned)
[name] OFFICE PROGRESS NOTE  [name], DO [address] [place] [address]  DIAGNOSIS: Extensive stage (T1c, N2, M1C) small cell lung cancer presented with right lower lobe lung nodule in addition to right hilar and mediastinal lymphadenopathy and bone metastasis involving thoracic spines as well as the right iliac crest diagnosed in July 2023.  PRIOR THERAPY: None  CURRENT THERAPY:  Systemic chemotherapy with carboplatin for AUC of 5 on day 1, etoposide 100 Mg/M2 on days 1, 2 and 3 with Imfinzi 1500 Mg IV on day 1 and Cosela 240 Mg/M2 on the days of the chemotherapy as well as Imfinzi 1500 Mg IV on day 1 every 3 weeks the first 4 cycles followed by maintenance treatment every 4 weeks starting from cycle #5.  First dose was giving 03/10/2022.  The patient status post 2 cycles.  INTERVAL HISTORY: Adam Jordan 75 y.o. male returns to the clinic for a follow up visit. The patient is feeling well today without any concerning complaints. The patient continues to tolerate treatment with chemotherapy and immunotherapy well without any adverse effects. Denies any fever, chills, night sweats, or weight loss. Denies any chest pain, shortness of breath, cough, or hemoptysis. Denies any nausea, vomiting, diarrhea, or constipation. Denies any headache or visual changes. Denies any rashes or skin changes. He recently had a restaging CT of the chest, abdomen, and pelvis. The patient is here today for evaluation and to review his scan prior to starting cycle # 3      MEDICAL HISTORY: Past Medical History:  Diagnosis Date   Anxiety    Arthritis    osteoarthrititis- knees and most joints.   COPD (chronic obstructive pulmonary disease) (HCC)    moderate -no regular use of inhalers- rare use of oxygen as sexual activity   Dyspnea    outside in hot weather and also with pollen   Hyperlipidemia    Hypertension    Neuromuscular disorder ([place])    feet   Paroxysmal  atrial flutter ([place]) 01/15/2021   Thoracoabdominal aneurysm (HCC)    s/p FEVAR 4 Vessel TABME 02/19/20 Dr. Katy Apo    ALLERGIES:  is allergic to losartan, atorvastatin calcium, and pollen extract-tree extract [pollen extract].  MEDICATIONS:  Current Outpatient Medications  Medication Sig Dispense Refill   acetaminophen (TYLENOL) 500 MG tablet Take 500 mg by mouth daily as needed for moderate pain or headache.     Alum Hydroxide-Mag Trisilicate (GAVISCON) 59-16.3 MG CHEW Chew 1 tablet by mouth daily as needed (heartburn).     apixaban (ELIQUIS) 5 MG TABS tablet Take 1 tablet (5 mg total) by mouth 2 (two) times daily. Okay to restart this medication on 02/24/2022 180 tablet 3   atorvastatin (LIPITOR) 20 MG tablet Take 20 mg by mouth at bedtime.     clonazePAM (KLONOPIN) 1 MG tablet Take 1 tablet (1 mg total) by mouth 2 (two) times daily as needed for anxiety. 60 tablet 5   fluticasone (FLONASE) 50 MCG/ACT nasal spray Place 1 spray into both nostrils 2 (two) times daily as needed for allergies.     lidocaine-prilocaine (EMLA) cream Apply to the Port-A-Cath site 30-60 minutes before chemotherapy 30 g 0   meloxicam (MOBIC) 15 MG tablet Take 15 mg by mouth daily as needed for pain.     Multiple Vitamin (MULTIVITAMIN WITH MINERALS) TABS tablet Take 1 tablet by mouth daily. 30 tablet 5   Naphazoline HCl (CLEAR EYES OP) Place 1 drop into both  eyes daily.     OVER THE COUNTER MEDICATION Take 1-3 capsules by mouth at bedtime. Oxy-Powder colon cleanse otc supplement/ Unknown strenght     PROAIR HFA 108 (90 BASE) MCG/ACT inhaler Inhale 2 puffs into the lungs every 6 (six) hours as needed for wheezing or shortness of breath.   2   prochlorperazine (COMPAZINE) 10 MG tablet Take 1 tablet (10 mg total) by mouth every 6 (six) hours as needed for nausea or vomiting. 30 tablet 0   sildenafil (REVATIO) 20 MG tablet Take 60 mg by mouth daily as needed (ED).     Tiotropium Bromide-Olodaterol (STIOLTO RESPIMAT)  2.5-2.5 MCG/ACT AERS Inhale 2 puffs into the lungs daily. 4 g 0   triamcinolone cream (KENALOG) 0.1 % Apply 1 Application topically daily.     No current facility-administered medications for this visit.   Facility-Administered Medications Ordered in Other Visits  Medication Dose Route Frequency Provider Last Rate Last Admin   sodium chloride (PF) 0.9 % injection             SURGICAL HISTORY:  Past Surgical History:  Procedure Laterality Date   ABDOMINAL AORTIC ANEURYSM REPAIR  02/06/2020   BRONCHIAL BIOPSY  02/23/2022   Procedure: BRONCHIAL BIOPSIES;  Surgeon: Collene Gobble, MD;  Location: [facility] ENDOSCOPY;  Service: Pulmonary;;   BRONCHIAL BRUSHINGS  02/23/2022   Procedure: BRONCHIAL BRUSHINGS;  Surgeon: Collene Gobble, MD;  Location: [facility] ENDOSCOPY;  Service: Pulmonary;;   BRONCHIAL NEEDLE ASPIRATION BIOPSY  02/23/2022   Procedure: BRONCHIAL NEEDLE ASPIRATION BIOPSIES;  Surgeon: Collene Gobble, MD;  Location: [facility];  Service: Pulmonary;;   BROW LIFT Bilateral 12/11/2021   Procedure: UPPER LID BLEPHAROPLASTY;  Surgeon: Irene Limbo, MD;  Location: [facility];  Service: Plastics;  Laterality: Bilateral;   CYST REMOVAL LEG Left 07/03/2021   Procedure: EXCISION CYST LEFT BUTTOCK;  Surgeon: Jovita Kussmaul, MD;  Location: [facility];  Service: General;  Laterality: Left;   EYE SURGERY Bilateral    cataract surgery   FINGER ARTHROPLASTY Left    left thumb-Dr. Amedeo Plenty   INGUINAL HERNIA REPAIR Left 07/03/2021   Procedure: LEFT INGUINAL HERNIA REPAIR WITH MESH;  Surgeon: Jovita Kussmaul, MD;  Location: [facility];  Service: General;  Laterality: Left;   INSERTION OF MESH N/A 07/03/2021   Procedure: INSERTION OF MESH X2;  Surgeon: Jovita Kussmaul, MD;  Location: [facility];  Service: General;  Laterality: N/A;   IR IMAGING GUIDED PORT INSERTION  03/12/2022   IR RADIOLOGIST EVAL & MGMT  10/25/2019   KNEE ARTHROSCOPY Left    KNEE SURGERY Left    Bakers cyst x2   SHOULDER ARTHROSCOPY Right     thumb surgery     TONSILLECTOMY     TOTAL KNEE ARTHROPLASTY Left 03/19/2016   Procedure: LEFT TOTAL KNEE ARTHROPLASTY;  Surgeon: Sydnee Cabal, MD;  Location: [facility];  Service: Orthopedics;  Laterality: Left;   TOTAL SHOULDER REPLACEMENT Right    UMBILICAL HERNIA REPAIR N/A 07/03/2021   Procedure: UMBILICAL HERNIA REPAIR WITH MESH;  Surgeon: Jovita Kussmaul, MD;  Location: [facility];  Service: General;  Laterality: N/A;   VASCULAR SURGERY     VASECTOMY     VIDEO BRONCHOSCOPY WITH ENDOBRONCHIAL ULTRASOUND Right 02/23/2022   Procedure: VIDEO BRONCHOSCOPY WITH ENDOBRONCHIAL ULTRASOUND;  Surgeon: Collene Gobble, MD;  Location: [facility] ENDOSCOPY;  Service: Pulmonary;  Laterality: Right;   VIDEO BRONCHOSCOPY WITH RADIAL ENDOBRONCHIAL ULTRASOUND  02/23/2022   Procedure: VIDEO BRONCHOSCOPY WITH RADIAL  ENDOBRONCHIAL ULTRASOUND;  Surgeon: Collene Gobble, MD;  Location[facility] ENDOSCOPY;  Service: Pulmonary;;    REVIEW OF SYSTEMS:   Review of Systems  Constitutional: Negative for appetite change, chills, fatigue, fever and unexpected weight change.  HENT:   Negative for mouth sores, nosebleeds, sore throat and trouble swallowing.   Eyes: Negative for eye problems and icterus.  Respiratory: Negative for cough, hemoptysis, shortness of breath and wheezing.   Cardiovascular: Negative for chest pain and leg swelling.  Gastrointestinal: Negative for abdominal pain, constipation, diarrhea, nausea and vomiting.  Genitourinary: Negative for bladder incontinence, difficulty urinating, dysuria, frequency and hematuria.   Musculoskeletal: Negative for back pain, gait problem, neck pain and neck stiffness.  Skin: Negative for itching and rash.  Neurological: Negative for dizziness, extremity weakness, gait problem, headaches, light-headedness and seizures.  Hematological: Negative for adenopathy. Does not bruise/bleed easily.  Psychiatric/Behavioral: Negative for confusion, depression and sleep disturbance. The patient is  not nervous/anxious.     PHYSICAL EXAMINATION:  There were no vitals taken for this visit.  ECOG PERFORMANCE STATUS: {CHL ONC ECOG [id number]  Physical Exam  Constitutional: Oriented to person, place, and time and well-developed, well-nourished, and in no distress. No distress.  HENT:  Head: Normocephalic and atraumatic.  Mouth/Throat: Oropharynx is clear and moist. No oropharyngeal exudate.  Eyes: Conjunctivae are normal. Right eye exhibits no discharge. Left eye exhibits no discharge. No scleral icterus.  Neck: Normal range of motion. Neck supple.  Cardiovascular: Normal rate, regular rhythm, normal heart sounds and intact distal pulses.   Pulmonary/Chest: Effort normal and breath sounds normal. No respiratory distress. No wheezes. No rales.  Abdominal: Soft. Bowel sounds are normal. Exhibits no distension and no mass. There is no tenderness.  Musculoskeletal: Normal range of motion. Exhibits no edema.  Lymphadenopathy:    No cervical adenopathy.  Neurological: Alert and oriented to person, place, and time. Exhibits normal muscle tone. Gait normal. Coordination normal.  Skin: Skin is warm and dry. No rash noted. Not diaphoretic. No erythema. No pallor.  Psychiatric: Mood, memory and judgment normal.  Vitals reviewed.  LABORATORY DATA: Lab Results  Component Value Date   WBC 4.7 04/14/2022   HGB 12.4 (L) 04/14/2022   HCT 36.3 (L) 04/14/2022   MCV 91.7 04/14/2022   PLT 121 (L) 04/14/2022      Chemistry      Component Value Date/Time   NA 139 04/14/2022 1336   NA 138 02/03/2021 0944   K 4.2 04/14/2022 1336   CL 106 04/14/2022 1336   CO2 27 04/14/2022 1336   BUN 16 04/14/2022 1336   BUN 13 02/03/2021 0944   CREATININE 0.95 04/14/2022 1336      Component Value Date/Time   CALCIUM 8.9 04/14/2022 1336   ALKPHOS 48 04/14/2022 1336   AST 16 04/14/2022 1336   ALT 16 04/14/2022 1336   BILITOT 0.4 04/14/2022 1336       RADIOGRAPHIC STUDIES:  No results  found.   ASSESSMENT/PLAN:  This is a very pleasant 76 years old Caucasian male with extensive stage (T1c, N2, M1C) small cell lung cancer presented with right lower lobe lung nodule in addition to right hilar and mediastinal lymphadenopathy and bone metastasis involving thoracic spines as well as the right iliac crest diagnosed in July 2023.  The patient started systemic chemotherapy with carboplatin for AUC of 5 on day 1, etoposide 100 Mg/M2 on days 1, 2 and 3 as well as Cosela 240 Mg/M2 on the days of the chemotherapy and  Imfinzi 1500 Mg IV every 3 weeks with the induction treatment.  He is status post 2 cycles.  The patient recently had a restaging CT scan of the chest, abdomen, and pelvis performed. Dr. Julien Nordmann personally and independently reviewed the scan and discussed the results with the patient. The scan showed ***.   Dr. Julien Nordmann recommends he continue on the same treatment at the same dose.   He will *** with cycle #3 today as scheduled.   We will see him back for a follow up visit in 3 weeks before starting cycle #4.   The patient was advised to call immediately if she has any concerning symptoms in the interval. The patient voices understanding of current disease status and treatment options and is in agreement with the current care plan. All questions were answered. The patient knows to call the clinic with any problems, questions or concerns. We can certainly see the patient much sooner if necessary      No orders of the defined types were placed in this encounter.    I spent {CHL ONC TIME VISIT - QMKJI:[id number]} counseling the patient face to face. The total time spent in the appointment was {CHL ONC TIME VISIT - RJPVG:[id number]}.  Posie Lillibridge L Marice Angelino, PA-C 04/19/22

## 2022-04-20 MED FILL — Dexamethasone Sodium Phosphate Inj 100 MG/10ML: INTRAMUSCULAR | Qty: 1 | Status: AC

## 2022-04-20 MED FILL — Fosaprepitant Dimeglumine For IV Infusion 150 MG (Base Eq): INTRAVENOUS | Qty: 5 | Status: AC

## 2022-04-21 ENCOUNTER — Inpatient Hospital Stay: Payer: Medicare HMO

## 2022-04-21 ENCOUNTER — Other Ambulatory Visit: Payer: Self-pay

## 2022-04-21 ENCOUNTER — Inpatient Hospital Stay (HOSPITAL_BASED_OUTPATIENT_CLINIC_OR_DEPARTMENT_OTHER): Payer: Medicare HMO | Admitting: Physician Assistant

## 2022-04-21 VITALS — BP 158/76 | HR 74 | Temp 99.0°F | Resp 18

## 2022-04-21 VITALS — BP 143/69 | HR 67 | Temp 97.7°F | Resp 18 | Ht 68.0 in | Wt 169.0 lb

## 2022-04-21 DIAGNOSIS — Z5112 Encounter for antineoplastic immunotherapy: Secondary | ICD-10-CM | POA: Diagnosis not present

## 2022-04-21 DIAGNOSIS — Z79899 Other long term (current) drug therapy: Secondary | ICD-10-CM | POA: Diagnosis not present

## 2022-04-21 DIAGNOSIS — C3431 Malignant neoplasm of lower lobe, right bronchus or lung: Secondary | ICD-10-CM

## 2022-04-21 DIAGNOSIS — C7951 Secondary malignant neoplasm of bone: Secondary | ICD-10-CM | POA: Diagnosis not present

## 2022-04-21 DIAGNOSIS — Z95828 Presence of other vascular implants and grafts: Secondary | ICD-10-CM

## 2022-04-21 DIAGNOSIS — Z5111 Encounter for antineoplastic chemotherapy: Secondary | ICD-10-CM

## 2022-04-21 LAB — CBC WITH DIFFERENTIAL (CANCER CENTER ONLY)
Abs Immature Granulocytes: 0.02 10*3/uL (ref 0.00–0.07)
Basophils Absolute: 0.1 10*3/uL (ref 0.0–0.1)
Basophils Relative: 1 %
Eosinophils Absolute: 0.1 10*3/uL (ref 0.0–0.5)
Eosinophils Relative: 2 %
HCT: 38.2 % — ABNORMAL LOW (ref 39.0–52.0)
Hemoglobin: 13.1 g/dL (ref 13.0–17.0)
Immature Granulocytes: 0 %
Lymphocytes Relative: 27 %
Lymphs Abs: 1.3 10*3/uL (ref 0.7–4.0)
MCH: 31 pg (ref 26.0–34.0)
MCHC: 34.3 g/dL (ref 30.0–36.0)
MCV: 90.3 fL (ref 80.0–100.0)
Monocytes Absolute: 0.5 10*3/uL (ref 0.1–1.0)
Monocytes Relative: 10 %
Neutro Abs: 2.7 10*3/uL (ref 1.7–7.7)
Neutrophils Relative %: 60 %
Platelet Count: 173 10*3/uL (ref 150–400)
RBC: 4.23 MIL/uL (ref 4.22–5.81)
RDW: 14.4 % (ref 11.5–15.5)
WBC Count: 4.6 10*3/uL (ref 4.0–10.5)
nRBC: 0 % (ref 0.0–0.2)

## 2022-04-21 LAB — CMP (CANCER CENTER ONLY)
ALT: 14 U/L (ref 0–44)
AST: 13 U/L — ABNORMAL LOW (ref 15–41)
Albumin: 4 g/dL (ref 3.5–5.0)
Alkaline Phosphatase: 51 U/L (ref 38–126)
Anion gap: 4 — ABNORMAL LOW (ref 5–15)
BUN: 14 mg/dL (ref 8–23)
CO2: 28 mmol/L (ref 22–32)
Calcium: 9.2 mg/dL (ref 8.9–10.3)
Chloride: 106 mmol/L (ref 98–111)
Creatinine: 0.79 mg/dL (ref 0.61–1.24)
GFR, Estimated: >60 mL/min (ref >60)
Glucose, Bld: 166 mg/dL — ABNORMAL HIGH (ref 70–99)
Potassium: 3.8 mmol/L (ref 3.5–5.1)
Sodium: 138 mmol/L (ref 135–145)
Total Bilirubin: 0.4 mg/dL (ref 0.3–1.2)
Total Protein: 6.5 g/dL (ref 6.5–8.1)

## 2022-04-21 LAB — TSH: TSH: 1.126 u[IU]/mL (ref 0.350–4.500)

## 2022-04-21 MED ORDER — TRILACICLIB DIHYDROCHLORIDE INJECTION 300 MG
240.0000 mg/m2 | Freq: Once | INTRAVENOUS | Status: AC
  Administered 2022-04-21: 465 mg via INTRAVENOUS
  Filled 2022-04-21: qty 31

## 2022-04-21 MED ORDER — SODIUM CHLORIDE 0.9 % IV SOLN
10.0000 mg | Freq: Once | INTRAVENOUS | Status: AC
  Administered 2022-04-21: 10 mg via INTRAVENOUS
  Filled 2022-04-21: qty 10

## 2022-04-21 MED ORDER — SODIUM CHLORIDE 0.9 % IV SOLN: Freq: Once | INTRAVENOUS | Status: AC

## 2022-04-21 MED ORDER — HEPARIN SOD (PORK) LOCK FLUSH 100 UNIT/ML IV SOLN
500.0000 [IU] | Freq: Once | INTRAVENOUS | Status: AC | PRN
  Administered 2022-04-21: 500 [IU]

## 2022-04-21 MED ORDER — SODIUM CHLORIDE 0.9% FLUSH
10.0000 mL | INTRAVENOUS | Status: DC | PRN
  Administered 2022-04-21: 10 mL

## 2022-04-21 MED ORDER — SODIUM CHLORIDE 0.9 % IV SOLN
100.0000 mg/m2 | Freq: Once | INTRAVENOUS | Status: AC
  Administered 2022-04-21: 190 mg via INTRAVENOUS
  Filled 2022-04-21: qty 9.5

## 2022-04-21 MED ORDER — SODIUM CHLORIDE 0.9% FLUSH
10.0000 mL | Freq: Once | INTRAVENOUS | Status: AC
  Administered 2022-04-21: 10 mL

## 2022-04-21 MED ORDER — SODIUM CHLORIDE 0.9 % IV SOLN
1500.0000 mg | Freq: Once | INTRAVENOUS | Status: AC
  Administered 2022-04-21: 1500 mg via INTRAVENOUS
  Filled 2022-04-21: qty 30

## 2022-04-21 MED ORDER — PALONOSETRON HCL INJECTION 0.25 MG/5ML
0.2500 mg | Freq: Once | INTRAVENOUS | Status: AC
  Administered 2022-04-21: 0.25 mg via INTRAVENOUS
  Filled 2022-04-21: qty 5

## 2022-04-21 MED ORDER — SODIUM CHLORIDE 0.9 % IV SOLN
475.0000 mg | Freq: Once | INTRAVENOUS | Status: AC
  Administered 2022-04-21: 480 mg via INTRAVENOUS
  Filled 2022-04-21: qty 48

## 2022-04-21 MED ORDER — SODIUM CHLORIDE 0.9 % IV SOLN
150.0000 mg | Freq: Once | INTRAVENOUS | Status: AC
  Administered 2022-04-21: 150 mg via INTRAVENOUS
  Filled 2022-04-21: qty 150

## 2022-04-21 MED FILL — Dexamethasone Sodium Phosphate Inj 100 MG/10ML: INTRAMUSCULAR | Qty: 1 | Status: AC

## 2022-04-21 NOTE — Patient Instructions (Signed)
[facility]  Discharge Instructions: Thank you for choosing [facility] to provide your oncology and hematology care.   If you have a lab appointment with the [facility], please go directly to the [facility] and check in at the registration area.   Wear comfortable clothing and clothing appropriate for easy access to any Portacath or PICC line.   We strive to give you quality time with your provider. You may need to reschedule your appointment if you arrive late (15 or more minutes).  Arriving late affects you and other patients whose appointments are after yours.  Also, if you miss three or more appointments without notifying the office, you may be dismissed from the clinic at the provider's discretion.      For prescription refill requests, have your pharmacy contact our office and allow 72 hours for refills to be completed.    Today you received the following chemotherapy and/or immunotherapy agents: Cosela, Imfinzi, Carboplatin, Etoposide      To help prevent nausea and vomiting after your treatment, we encourage you to take your nausea medication as directed.  BELOW ARE SYMPTOMS THAT SHOULD BE REPORTED IMMEDIATELY: *FEVER GREATER THAN 100.4 F (38 C) OR HIGHER *CHILLS OR SWEATING *NAUSEA AND VOMITING THAT IS NOT CONTROLLED WITH YOUR NAUSEA MEDICATION *UNUSUAL SHORTNESS OF BREATH *UNUSUAL BRUISING OR BLEEDING *URINARY PROBLEMS (pain or burning when urinating, or frequent urination) *BOWEL PROBLEMS (unusual diarrhea, constipation, pain near the anus) TENDERNESS IN MOUTH AND THROAT WITH OR WITHOUT PRESENCE OF ULCERS (sore throat, sores in mouth, or a toothache) UNUSUAL RASH, SWELLING OR PAIN  UNUSUAL VAGINAL DISCHARGE OR ITCHING   Items with * indicate a potential emergency and should be followed up as soon as possible or go to the Emergency Department if any problems should occur.  Please show the CHEMOTHERAPY ALERT CARD or  IMMUNOTHERAPY ALERT CARD at check-in to the Emergency Department and triage nurse.  Should you have questions after your visit or need to cancel or reschedule your appointment, please contact [facility]  Dept: [phone]  and follow the prompts.  Office hours are 8:00 a.m. to 4:30 p.m. Monday - Friday. Please note that voicemails left after 4:00 p.m. may not be returned until the following business day.  We are closed weekends and major holidays. You have access to a nurse at all times for urgent questions. Please call the main number to the clinic Dept: [phone] and follow the prompts.   For any non-urgent questions, you may also contact your provider using MyChart. We now offer e-Visits for anyone 43 and older to request care online for non-urgent symptoms. For details visit [email or web address].   Also download the MyChart app! Go to the app store, search "MyChart", open the app, select [facility], and log in with your MyChart username and password.  Masks are optional in the cancer centers. If you would like for your care team to wear a mask while they are taking care of you, please let them know. You may have one support person who is at least 75 years old accompany you for your appointments.

## 2022-04-22 ENCOUNTER — Inpatient Hospital Stay: Payer: Medicare HMO

## 2022-04-22 VITALS — BP 138/71 | HR 74 | Temp 98.2°F | Resp 18

## 2022-04-22 DIAGNOSIS — Z5111 Encounter for antineoplastic chemotherapy: Secondary | ICD-10-CM | POA: Diagnosis not present

## 2022-04-22 DIAGNOSIS — Z79899 Other long term (current) drug therapy: Secondary | ICD-10-CM | POA: Diagnosis not present

## 2022-04-22 DIAGNOSIS — C3431 Malignant neoplasm of lower lobe, right bronchus or lung: Secondary | ICD-10-CM

## 2022-04-22 DIAGNOSIS — C7951 Secondary malignant neoplasm of bone: Secondary | ICD-10-CM | POA: Diagnosis not present

## 2022-04-22 MED ORDER — SODIUM CHLORIDE 0.9 % IV SOLN: Freq: Once | INTRAVENOUS | Status: AC

## 2022-04-22 MED ORDER — HEPARIN SOD (PORK) LOCK FLUSH 100 UNIT/ML IV SOLN
500.0000 [IU] | Freq: Once | INTRAVENOUS | Status: AC | PRN
  Administered 2022-04-22: 500 [IU]

## 2022-04-22 MED ORDER — SODIUM CHLORIDE 0.9 % IV SOLN
10.0000 mg | Freq: Once | INTRAVENOUS | Status: AC
  Administered 2022-04-22: 10 mg via INTRAVENOUS
  Filled 2022-04-22: qty 10

## 2022-04-22 MED ORDER — SODIUM CHLORIDE 0.9 % IV SOLN
100.0000 mg/m2 | Freq: Once | INTRAVENOUS | Status: AC
  Administered 2022-04-22: 190 mg via INTRAVENOUS
  Filled 2022-04-22: qty 9.5

## 2022-04-22 MED ORDER — SODIUM CHLORIDE 0.9% FLUSH
10.0000 mL | INTRAVENOUS | Status: DC | PRN
  Administered 2022-04-22: 10 mL

## 2022-04-22 MED ORDER — TRILACICLIB DIHYDROCHLORIDE INJECTION 300 MG
240.0000 mg/m2 | Freq: Once | INTRAVENOUS | Status: AC
  Administered 2022-04-22: 465 mg via INTRAVENOUS
  Filled 2022-04-22: qty 31

## 2022-04-22 MED FILL — Dexamethasone Sodium Phosphate Inj 100 MG/10ML: INTRAMUSCULAR | Qty: 1 | Status: AC

## 2022-04-22 NOTE — Patient Instructions (Signed)
[facility]  Discharge Instructions: Thank you for choosing [facility] to provide your oncology and hematology care.   If you have a lab appointment with the [facility], please go directly to the [facility] and check in at the registration area.   Wear comfortable clothing and clothing appropriate for easy access to any Portacath or PICC line.   We strive to give you quality time with your provider. You may need to reschedule your appointment if you arrive late (15 or more minutes).  Arriving late affects you and other patients whose appointments are after yours.  Also, if you miss three or more appointments without notifying the office, you may be dismissed from the clinic at the provider's discretion.      For prescription refill requests, have your pharmacy contact our office and allow 72 hours for refills to be completed.    Today you received the following chemotherapy and/or immunotherapy agents Cosela and Etoposide      To help prevent nausea and vomiting after your treatment, we encourage you to take your nausea medication as directed.  BELOW ARE SYMPTOMS THAT SHOULD BE REPORTED IMMEDIATELY: *FEVER GREATER THAN 100.4 F (38 C) OR HIGHER *CHILLS OR SWEATING *NAUSEA AND VOMITING THAT IS NOT CONTROLLED WITH YOUR NAUSEA MEDICATION *UNUSUAL SHORTNESS OF BREATH *UNUSUAL BRUISING OR BLEEDING *URINARY PROBLEMS (pain or burning when urinating, or frequent urination) *BOWEL PROBLEMS (unusual diarrhea, constipation, pain near the anus) TENDERNESS IN MOUTH AND THROAT WITH OR WITHOUT PRESENCE OF ULCERS (sore throat, sores in mouth, or a toothache) UNUSUAL RASH, SWELLING OR PAIN  UNUSUAL VAGINAL DISCHARGE OR ITCHING   Items with * indicate a potential emergency and should be followed up as soon as possible or go to the Emergency Department if any problems should occur.  Please show the CHEMOTHERAPY ALERT CARD or IMMUNOTHERAPY ALERT CARD at  check-in to the Emergency Department and triage nurse.  Should you have questions after your visit or need to cancel or reschedule your appointment, please contact [facility]  Dept: [phone]  and follow the prompts.  Office hours are 8:00 a.m. to 4:30 p.m. Monday - Friday. Please note that voicemails left after 4:00 p.m. may not be returned until the following business day.  We are closed weekends and major holidays. You have access to a nurse at all times for urgent questions. Please call the main number to the clinic Dept: [phone] and follow the prompts.   For any non-urgent questions, you may also contact your provider using MyChart. We now offer e-Visits for anyone 43 and older to request care online for non-urgent symptoms. For details visit [email or web address].   Also download the MyChart app! Go to the app store, search "MyChart", open the app, select [facility], and log in with your MyChart username and password.  Masks are optional in the cancer centers. If you would like for your care team to wear a mask while they are taking care of you, please let them know. You may have one support person who is at least 75 years old accompany you for your appointments.

## 2022-04-23 ENCOUNTER — Inpatient Hospital Stay: Payer: Medicare HMO

## 2022-04-23 ENCOUNTER — Other Ambulatory Visit: Payer: Self-pay

## 2022-04-23 VITALS — BP 128/66 | HR 63 | Temp 98.0°F | Resp 17

## 2022-04-23 DIAGNOSIS — C7951 Secondary malignant neoplasm of bone: Secondary | ICD-10-CM | POA: Diagnosis not present

## 2022-04-23 DIAGNOSIS — Z79899 Other long term (current) drug therapy: Secondary | ICD-10-CM | POA: Diagnosis not present

## 2022-04-23 DIAGNOSIS — Z5111 Encounter for antineoplastic chemotherapy: Secondary | ICD-10-CM | POA: Diagnosis not present

## 2022-04-23 DIAGNOSIS — C3431 Malignant neoplasm of lower lobe, right bronchus or lung: Secondary | ICD-10-CM | POA: Diagnosis not present

## 2022-04-23 MED ORDER — TRILACICLIB DIHYDROCHLORIDE INJECTION 300 MG
240.0000 mg/m2 | Freq: Once | INTRAVENOUS | Status: AC
  Administered 2022-04-23: 465 mg via INTRAVENOUS
  Filled 2022-04-23: qty 31

## 2022-04-23 MED ORDER — SODIUM CHLORIDE 0.9 % IV SOLN: Freq: Once | INTRAVENOUS | Status: AC

## 2022-04-23 MED ORDER — SODIUM CHLORIDE 0.9 % IV SOLN
10.0000 mg | Freq: Once | INTRAVENOUS | Status: AC
  Administered 2022-04-23: 10 mg via INTRAVENOUS
  Filled 2022-04-23: qty 10

## 2022-04-23 MED ORDER — SODIUM CHLORIDE 0.9% FLUSH
10.0000 mL | INTRAVENOUS | Status: DC | PRN
  Administered 2022-04-23: 10 mL

## 2022-04-23 MED ORDER — HEPARIN SOD (PORK) LOCK FLUSH 100 UNIT/ML IV SOLN
500.0000 [IU] | Freq: Once | INTRAVENOUS | Status: AC | PRN
  Administered 2022-04-23: 500 [IU]

## 2022-04-23 MED ORDER — SODIUM CHLORIDE 0.9 % IV SOLN
100.0000 mg/m2 | Freq: Once | INTRAVENOUS | Status: AC
  Administered 2022-04-23: 190 mg via INTRAVENOUS
  Filled 2022-04-23: qty 9.5

## 2022-04-24 ENCOUNTER — Other Ambulatory Visit: Payer: Self-pay

## 2022-04-28 ENCOUNTER — Inpatient Hospital Stay: Payer: Medicare HMO

## 2022-04-28 DIAGNOSIS — Z79899 Other long term (current) drug therapy: Secondary | ICD-10-CM | POA: Diagnosis not present

## 2022-04-28 DIAGNOSIS — C3431 Malignant neoplasm of lower lobe, right bronchus or lung: Secondary | ICD-10-CM

## 2022-04-28 DIAGNOSIS — Z5111 Encounter for antineoplastic chemotherapy: Secondary | ICD-10-CM | POA: Diagnosis not present

## 2022-04-28 DIAGNOSIS — C7951 Secondary malignant neoplasm of bone: Secondary | ICD-10-CM | POA: Diagnosis not present

## 2022-04-28 LAB — CMP (CANCER CENTER ONLY)
ALT: 20 U/L (ref 0–44)
AST: 15 U/L (ref 15–41)
Albumin: 3.9 g/dL (ref 3.5–5.0)
Alkaline Phosphatase: 51 U/L (ref 38–126)
Anion gap: 6 (ref 5–15)
BUN: 25 mg/dL — ABNORMAL HIGH (ref 8–23)
CO2: 26 mmol/L (ref 22–32)
Calcium: 9 mg/dL (ref 8.9–10.3)
Chloride: 105 mmol/L (ref 98–111)
Creatinine: 0.8 mg/dL (ref 0.61–1.24)
GFR, Estimated: >60 mL/min (ref >60)
Glucose, Bld: 180 mg/dL — ABNORMAL HIGH (ref 70–99)
Potassium: 4 mmol/L (ref 3.5–5.1)
Sodium: 137 mmol/L (ref 135–145)
Total Bilirubin: 0.6 mg/dL (ref 0.3–1.2)
Total Protein: 6.7 g/dL (ref 6.5–8.1)

## 2022-04-28 LAB — CBC WITH DIFFERENTIAL (CANCER CENTER ONLY)
Abs Immature Granulocytes: 0.07 10*3/uL (ref 0.00–0.07)
Basophils Absolute: 0 10*3/uL (ref 0.0–0.1)
Basophils Relative: 1 %
Eosinophils Absolute: 0 10*3/uL (ref 0.0–0.5)
Eosinophils Relative: 0 %
HCT: 35.2 % — ABNORMAL LOW (ref 39.0–52.0)
Hemoglobin: 12.5 g/dL — ABNORMAL LOW (ref 13.0–17.0)
Immature Granulocytes: 1 %
Lymphocytes Relative: 22 %
Lymphs Abs: 1.6 10*3/uL (ref 0.7–4.0)
MCH: 32 pg (ref 26.0–34.0)
MCHC: 35.5 g/dL (ref 30.0–36.0)
MCV: 90 fL (ref 80.0–100.0)
Monocytes Absolute: 0.2 10*3/uL (ref 0.1–1.0)
Monocytes Relative: 2 %
Neutro Abs: 5.2 10*3/uL (ref 1.7–7.7)
Neutrophils Relative %: 74 %
Platelet Count: 165 10*3/uL (ref 150–400)
RBC: 3.91 MIL/uL — ABNORMAL LOW (ref 4.22–5.81)
RDW: 14.1 % (ref 11.5–15.5)
WBC Count: 7.1 10*3/uL (ref 4.0–10.5)
nRBC: 0 % (ref 0.0–0.2)

## 2022-05-03 ENCOUNTER — Other Ambulatory Visit: Payer: Self-pay

## 2022-05-05 ENCOUNTER — Other Ambulatory Visit: Payer: Self-pay

## 2022-05-05 ENCOUNTER — Inpatient Hospital Stay: Payer: Medicare HMO | Attending: Internal Medicine

## 2022-05-05 DIAGNOSIS — C3431 Malignant neoplasm of lower lobe, right bronchus or lung: Secondary | ICD-10-CM | POA: Insufficient documentation

## 2022-05-05 DIAGNOSIS — C7951 Secondary malignant neoplasm of bone: Secondary | ICD-10-CM | POA: Insufficient documentation

## 2022-05-05 DIAGNOSIS — Z79899 Other long term (current) drug therapy: Secondary | ICD-10-CM | POA: Insufficient documentation

## 2022-05-05 DIAGNOSIS — R739 Hyperglycemia, unspecified: Secondary | ICD-10-CM | POA: Diagnosis not present

## 2022-05-05 DIAGNOSIS — Z5111 Encounter for antineoplastic chemotherapy: Secondary | ICD-10-CM | POA: Diagnosis present

## 2022-05-05 DIAGNOSIS — Z95828 Presence of other vascular implants and grafts: Secondary | ICD-10-CM

## 2022-05-05 LAB — CMP (CANCER CENTER ONLY)
ALT: 16 U/L (ref 0–44)
AST: 18 U/L (ref 15–41)
Albumin: 4.2 g/dL (ref 3.5–5.0)
Alkaline Phosphatase: 52 U/L (ref 38–126)
Anion gap: 4 — ABNORMAL LOW (ref 5–15)
BUN: 11 mg/dL (ref 8–23)
CO2: 27 mmol/L (ref 22–32)
Calcium: 9.4 mg/dL (ref 8.9–10.3)
Chloride: 105 mmol/L (ref 98–111)
Creatinine: 0.8 mg/dL (ref 0.61–1.24)
GFR, Estimated: >60 mL/min (ref >60)
Glucose, Bld: 132 mg/dL — ABNORMAL HIGH (ref 70–99)
Potassium: 4.3 mmol/L (ref 3.5–5.1)
Sodium: 136 mmol/L (ref 135–145)
Total Bilirubin: 0.6 mg/dL (ref 0.3–1.2)
Total Protein: 6.5 g/dL (ref 6.5–8.1)

## 2022-05-05 LAB — CBC WITH DIFFERENTIAL (CANCER CENTER ONLY)
Abs Immature Granulocytes: 0.01 10*3/uL (ref 0.00–0.07)
Basophils Absolute: 0 10*3/uL (ref 0.0–0.1)
Basophils Relative: 1 %
Eosinophils Absolute: 0 10*3/uL (ref 0.0–0.5)
Eosinophils Relative: 1 %
HCT: 36.3 % — ABNORMAL LOW (ref 39.0–52.0)
Hemoglobin: 12.6 g/dL — ABNORMAL LOW (ref 13.0–17.0)
Immature Granulocytes: 0 %
Lymphocytes Relative: 23 %
Lymphs Abs: 0.9 10*3/uL (ref 0.7–4.0)
MCH: 31.6 pg (ref 26.0–34.0)
MCHC: 34.7 g/dL (ref 30.0–36.0)
MCV: 91 fL (ref 80.0–100.0)
Monocytes Absolute: 0.5 10*3/uL (ref 0.1–1.0)
Monocytes Relative: 12 %
Neutro Abs: 2.6 10*3/uL (ref 1.7–7.7)
Neutrophils Relative %: 63 %
Platelet Count: 111 10*3/uL — ABNORMAL LOW (ref 150–400)
RBC: 3.99 MIL/uL — ABNORMAL LOW (ref 4.22–5.81)
RDW: 14.8 % (ref 11.5–15.5)
WBC Count: 4.2 10*3/uL (ref 4.0–10.5)
nRBC: 0 % (ref 0.0–0.2)

## 2022-05-05 MED ORDER — SODIUM CHLORIDE 0.9% FLUSH
10.0000 mL | Freq: Once | INTRAVENOUS | Status: AC
  Administered 2022-05-05: 10 mL

## 2022-05-05 MED ORDER — HEPARIN SOD (PORK) LOCK FLUSH 100 UNIT/ML IV SOLN
500.0000 [IU] | Freq: Once | INTRAVENOUS | Status: AC
  Administered 2022-05-05: 500 [IU]

## 2022-05-11 MED FILL — Dexamethasone Sodium Phosphate Inj 100 MG/10ML: INTRAMUSCULAR | Qty: 1 | Status: AC

## 2022-05-11 MED FILL — Fosaprepitant Dimeglumine For IV Infusion 150 MG (Base Eq): INTRAVENOUS | Qty: 5 | Status: AC

## 2022-05-12 ENCOUNTER — Encounter: Payer: Self-pay | Admitting: Internal Medicine

## 2022-05-12 ENCOUNTER — Inpatient Hospital Stay: Payer: Medicare HMO

## 2022-05-12 ENCOUNTER — Inpatient Hospital Stay (HOSPITAL_BASED_OUTPATIENT_CLINIC_OR_DEPARTMENT_OTHER): Payer: Medicare HMO | Admitting: Internal Medicine

## 2022-05-12 VITALS — BP 139/82 | HR 59 | Temp 97.8°F | Resp 16

## 2022-05-12 DIAGNOSIS — C7951 Secondary malignant neoplasm of bone: Secondary | ICD-10-CM | POA: Diagnosis not present

## 2022-05-12 DIAGNOSIS — C3431 Malignant neoplasm of lower lobe, right bronchus or lung: Secondary | ICD-10-CM

## 2022-05-12 DIAGNOSIS — R739 Hyperglycemia, unspecified: Secondary | ICD-10-CM | POA: Diagnosis not present

## 2022-05-12 DIAGNOSIS — Z79899 Other long term (current) drug therapy: Secondary | ICD-10-CM | POA: Diagnosis not present

## 2022-05-12 DIAGNOSIS — Z95828 Presence of other vascular implants and grafts: Secondary | ICD-10-CM

## 2022-05-12 DIAGNOSIS — C3490 Malignant neoplasm of unspecified part of unspecified bronchus or lung: Secondary | ICD-10-CM

## 2022-05-12 DIAGNOSIS — Z5111 Encounter for antineoplastic chemotherapy: Secondary | ICD-10-CM | POA: Diagnosis not present

## 2022-05-12 LAB — CMP (CANCER CENTER ONLY)
ALT: 16 U/L (ref 0–44)
AST: 10 U/L — ABNORMAL LOW (ref 15–41)
Albumin: 4.3 g/dL (ref 3.5–5.0)
Alkaline Phosphatase: 65 U/L (ref 38–126)
Anion gap: 5 (ref 5–15)
BUN: 18 mg/dL (ref 8–23)
CO2: 27 mmol/L (ref 22–32)
Calcium: 9.6 mg/dL (ref 8.9–10.3)
Chloride: 102 mmol/L (ref 98–111)
Creatinine: 0.93 mg/dL (ref 0.61–1.24)
GFR, Estimated: >60 mL/min (ref >60)
Glucose, Bld: 393 mg/dL — ABNORMAL HIGH (ref 70–99)
Potassium: 4.1 mmol/L (ref 3.5–5.1)
Sodium: 134 mmol/L — ABNORMAL LOW (ref 135–145)
Total Bilirubin: 0.5 mg/dL (ref 0.3–1.2)
Total Protein: 7.2 g/dL (ref 6.5–8.1)

## 2022-05-12 LAB — CBC WITH DIFFERENTIAL (CANCER CENTER ONLY)
Abs Immature Granulocytes: 0.03 10*3/uL (ref 0.00–0.07)
Basophils Absolute: 0.1 10*3/uL (ref 0.0–0.1)
Basophils Relative: 2 %
Eosinophils Absolute: 0.2 10*3/uL (ref 0.0–0.5)
Eosinophils Relative: 3 %
HCT: 37.2 % — ABNORMAL LOW (ref 39.0–52.0)
Hemoglobin: 13.1 g/dL (ref 13.0–17.0)
Immature Granulocytes: 1 %
Lymphocytes Relative: 25 %
Lymphs Abs: 1.3 10*3/uL (ref 0.7–4.0)
MCH: 31.7 pg (ref 26.0–34.0)
MCHC: 35.2 g/dL (ref 30.0–36.0)
MCV: 90.1 fL (ref 80.0–100.0)
Monocytes Absolute: 0.5 10*3/uL (ref 0.1–1.0)
Monocytes Relative: 10 %
Neutro Abs: 3.1 10*3/uL (ref 1.7–7.7)
Neutrophils Relative %: 59 %
Platelet Count: 171 10*3/uL (ref 150–400)
RBC: 4.13 MIL/uL — ABNORMAL LOW (ref 4.22–5.81)
RDW: 14.6 % (ref 11.5–15.5)
WBC Count: 5.2 10*3/uL (ref 4.0–10.5)
nRBC: 0 % (ref 0.0–0.2)

## 2022-05-12 LAB — TSH: TSH: 1.505 u[IU]/mL (ref 0.350–4.500)

## 2022-05-12 MED ORDER — PALONOSETRON HCL INJECTION 0.25 MG/5ML
0.2500 mg | Freq: Once | INTRAVENOUS | Status: AC
  Administered 2022-05-12: 0.25 mg via INTRAVENOUS
  Filled 2022-05-12: qty 5

## 2022-05-12 MED ORDER — SODIUM CHLORIDE 0.9 % IV SOLN: Freq: Once | INTRAVENOUS | Status: AC

## 2022-05-12 MED ORDER — TRILACICLIB DIHYDROCHLORIDE INJECTION 300 MG
240.0000 mg/m2 | Freq: Once | INTRAVENOUS | Status: AC
  Administered 2022-05-12: 465 mg via INTRAVENOUS
  Filled 2022-05-12: qty 31

## 2022-05-12 MED ORDER — SODIUM CHLORIDE 0.9 % IV SOLN
10.0000 mg | Freq: Once | INTRAVENOUS | Status: AC
  Administered 2022-05-12: 10 mg via INTRAVENOUS
  Filled 2022-05-12: qty 10

## 2022-05-12 MED ORDER — SODIUM CHLORIDE 0.9% FLUSH
10.0000 mL | Freq: Once | INTRAVENOUS | Status: AC
  Administered 2022-05-12: 10 mL

## 2022-05-12 MED ORDER — SODIUM CHLORIDE 0.9% FLUSH
10.0000 mL | INTRAVENOUS | Status: DC | PRN
  Administered 2022-05-12: 10 mL

## 2022-05-12 MED ORDER — SODIUM CHLORIDE 0.9 % IV SOLN
150.0000 mg | Freq: Once | INTRAVENOUS | Status: AC
  Administered 2022-05-12: 150 mg via INTRAVENOUS
  Filled 2022-05-12: qty 150

## 2022-05-12 MED ORDER — SODIUM CHLORIDE 0.9 % IV SOLN
100.0000 mg/m2 | Freq: Once | INTRAVENOUS | Status: AC
  Administered 2022-05-12: 190 mg via INTRAVENOUS
  Filled 2022-05-12: qty 9.5

## 2022-05-12 MED ORDER — SODIUM CHLORIDE 0.9 % IV SOLN
1500.0000 mg | Freq: Once | INTRAVENOUS | Status: AC
  Administered 2022-05-12: 1500 mg via INTRAVENOUS
  Filled 2022-05-12: qty 30

## 2022-05-12 MED ORDER — SODIUM CHLORIDE 0.9 % IV SOLN
475.0000 mg | Freq: Once | INTRAVENOUS | Status: AC
  Administered 2022-05-12: 480 mg via INTRAVENOUS
  Filled 2022-05-12: qty 48

## 2022-05-12 MED ORDER — HEPARIN SOD (PORK) LOCK FLUSH 100 UNIT/ML IV SOLN
500.0000 [IU] | Freq: Once | INTRAVENOUS | Status: AC | PRN
  Administered 2022-05-12: 500 [IU]

## 2022-05-12 MED FILL — Dexamethasone Sodium Phosphate Inj 100 MG/10ML: INTRAMUSCULAR | Qty: 1 | Status: AC

## 2022-05-12 NOTE — Progress Notes (Signed)
[facility] [phone]   [phone]  OFFICE PROGRESS NOTE  [name], DO [place] [address]  DIAGNOSIS: Extensive stage (T1c, N2, M1C) small cell lung cancer presented with right lower lobe lung nodule in addition to right hilar and mediastinal lymphadenopathy and bone metastasis involving thoracic spines as well as the right iliac crest diagnosed in July 2023.  PRIOR THERAPY: None  CURRENT THERAPY: Systemic chemotherapy with carboplatin for AUC of 5 on day 1, etoposide 100 Mg/M2 on days 1, 2 and 3 with Imfinzi 1500 Mg IV on day 1 and Cosela 240 Mg/M2 on the days of the chemotherapy as well as Imfinzi 1500 Mg IV on day 1 every 3 weeks the first 4 cycles followed by maintenance treatment every 4 weeks starting from cycle #5.  First dose was giving 03/10/2022.  The patient status post 3 cycles.  INTERVAL HISTORY: Adam Jordan 76 y.o. male returns to the clinic today for follow-up visit.  The patient is feeling fine today with no concerning complaints except for mild fatigue.  He denied having any chest pain, shortness of breath, cough or hemoptysis.  He denied having any fever or chills.  He has no nausea, vomiting, diarrhea or constipation.  He has no headache or visual changes.  He denied having any recent weight loss or night sweats.  The patient has been tolerating his systemic chemotherapy fairly well.  He is here today for evaluation before starting cycle #4.  MEDICAL HISTORY: Past Medical History:  Diagnosis Date   Anxiety    Arthritis    osteoarthrititis- knees and most joints.   COPD (chronic obstructive pulmonary disease) (HCC)    moderate -no regular use of inhalers- rare use of oxygen as sexual activity   Dyspnea    outside in hot weather and also with pollen   Hyperlipidemia    Hypertension    Neuromuscular disorder ([facility])    feet   Paroxysmal atrial flutter ([facility]) 01/15/2021   Thoracoabdominal  aneurysm (HCC)    s/p FEVAR 4 Vessel TABME 02/19/20 Dr. Katy Apo    ALLERGIES:  is allergic to losartan, atorvastatin calcium, and pollen extract-tree extract [pollen extract].  MEDICATIONS:  Current Outpatient Medications  Medication Sig Dispense Refill   acetaminophen (TYLENOL) 500 MG tablet Take 500 mg by mouth daily as needed for moderate pain or headache.     Alum Hydroxide-Mag Trisilicate (GAVISCON) 52-84.1 MG CHEW Chew 1 tablet by mouth daily as needed (heartburn).     apixaban (ELIQUIS) 5 MG TABS tablet Take 1 tablet (5 mg total) by mouth 2 (two) times daily. Okay to restart this medication on 02/24/2022 180 tablet 3   atorvastatin (LIPITOR) 20 MG tablet Take 20 mg by mouth at bedtime.     clonazePAM (KLONOPIN) 1 MG tablet Take 1 tablet (1 mg total) by mouth 2 (two) times daily as needed for anxiety. 60 tablet 5   fluticasone (FLONASE) 50 MCG/ACT nasal spray Place 1 spray into both nostrils 2 (two) times daily as needed for allergies.     lidocaine-prilocaine (EMLA) cream Apply to the Port-A-Cath site 30-60 minutes before chemotherapy 30 g 0   meloxicam (MOBIC) 15 MG tablet Take 15 mg by mouth daily as needed for pain.     Multiple Vitamin (MULTIVITAMIN WITH MINERALS) TABS tablet Take 1 tablet by mouth daily. 30 tablet 5   Naphazoline HCl (CLEAR EYES OP) Place 1 drop into both eyes daily.  OVER THE COUNTER MEDICATION Take 1-3 capsules by mouth at bedtime. Oxy-Powder colon cleanse otc supplement/ Unknown strenght     PROAIR HFA 108 (90 BASE) MCG/ACT inhaler Inhale 2 puffs into the lungs every 6 (six) hours as needed for wheezing or shortness of breath.   2   prochlorperazine (COMPAZINE) 10 MG tablet Take 1 tablet (10 mg total) by mouth every 6 (six) hours as needed for nausea or vomiting. 30 tablet 0   sildenafil (REVATIO) 20 MG tablet Take 60 mg by mouth daily as needed (ED).     Tiotropium Bromide-Olodaterol (STIOLTO RESPIMAT) 2.5-2.5 MCG/ACT AERS Inhale 2 puffs into the lungs  daily. 4 g 0   triamcinolone cream (KENALOG) 0.1 % Apply 1 Application topically daily.     No current facility-administered medications for this visit.    SURGICAL HISTORY:  Past Surgical History:  Procedure Laterality Date   ABDOMINAL AORTIC ANEURYSM REPAIR  02/06/2020   BRONCHIAL BIOPSY  02/23/2022   Procedure: BRONCHIAL BIOPSIES;  Surgeon: Collene Gobble, MD;  Location: [facility] ENDOSCOPY;  Service: Pulmonary;;   BRONCHIAL BRUSHINGS  02/23/2022   Procedure: BRONCHIAL BRUSHINGS;  Surgeon: Collene Gobble, MD;  Location: [facility] ENDOSCOPY;  Service: Pulmonary;;   BRONCHIAL NEEDLE ASPIRATION BIOPSY  02/23/2022   Procedure: BRONCHIAL NEEDLE ASPIRATION BIOPSIES;  Surgeon: Collene Gobble, MD;  Location: [place] [facility] ENDOSCOPY;  Service: Pulmonary;;   BROW LIFT Bilateral 12/11/2021   Procedure: UPPER LID BLEPHAROPLASTY;  Surgeon: Irene Limbo, MD;  Location: [facility];  Service: Plastics;  Laterality: Bilateral;   CYST REMOVAL LEG Left 07/03/2021   Procedure: EXCISION CYST LEFT BUTTOCK;  Surgeon: Jovita Kussmaul, MD;  Location: [facility];  Service: General;  Laterality: Left;   EYE SURGERY Bilateral    cataract surgery   FINGER ARTHROPLASTY Left    left thumb-Dr. Amedeo Plenty   INGUINAL HERNIA REPAIR Left 07/03/2021   Procedure: LEFT INGUINAL HERNIA REPAIR WITH MESH;  Surgeon: Jovita Kussmaul, MD;  Location: [facility];  Service: General;  Laterality: Left;   INSERTION OF MESH N/A 07/03/2021   Procedure: INSERTION OF MESH X2;  Surgeon: Jovita Kussmaul, MD;  Location: [facility];  Service: General;  Laterality: N/A;   IR IMAGING GUIDED PORT INSERTION  03/12/2022   IR RADIOLOGIST EVAL & MGMT  10/25/2019   KNEE ARTHROSCOPY Left    KNEE SURGERY Left    Bakers cyst x2   SHOULDER ARTHROSCOPY Right    thumb surgery     TONSILLECTOMY     TOTAL KNEE ARTHROPLASTY Left 03/19/2016   Procedure: LEFT TOTAL KNEE ARTHROPLASTY;  Surgeon: Sydnee Cabal, MD;  Location: [facility];  Service: Orthopedics;  Laterality: Left;   TOTAL  SHOULDER REPLACEMENT Right    UMBILICAL HERNIA REPAIR N/A 07/03/2021   Procedure: UMBILICAL HERNIA REPAIR WITH MESH;  Surgeon: Jovita Kussmaul, MD;  Location: [facility];  Service: General;  Laterality: N/A;   VASCULAR SURGERY     VASECTOMY     VIDEO BRONCHOSCOPY WITH ENDOBRONCHIAL ULTRASOUND Right 02/23/2022   Procedure: VIDEO BRONCHOSCOPY WITH ENDOBRONCHIAL ULTRASOUND;  Surgeon: Collene Gobble, MD;  Location: [facility] ENDOSCOPY;  Service: Pulmonary;  Laterality: Right;   VIDEO BRONCHOSCOPY WITH RADIAL ENDOBRONCHIAL ULTRASOUND  02/23/2022   Procedure: VIDEO BRONCHOSCOPY WITH RADIAL ENDOBRONCHIAL ULTRASOUND;  Surgeon: Collene Gobble, MD;  Location: [facility] ENDOSCOPY;  Service: Pulmonary;;    REVIEW OF SYSTEMS:  A comprehensive review of systems was negative except for: Constitutional: positive for fatigue   PHYSICAL EXAMINATION: General appearance:  alert, cooperative, fatigued, and no distress Head: Normocephalic, without obvious abnormality, atraumatic Neck: no adenopathy, no JVD, supple, symmetrical, trachea midline, and thyroid not enlarged, symmetric, no tenderness/mass/nodules Lymph nodes: Cervical, supraclavicular, and axillary nodes normal. Resp: clear to auscultation bilaterally Back: symmetric, no curvature. ROM normal. No CVA tenderness. Cardio: regular rate and rhythm, S1, S2 normal, no murmur, click, rub or gallop GI: soft, non-tender; bowel sounds normal; no masses,  no organomegaly Extremities: extremities normal, atraumatic, no cyanosis or edema  ECOG PERFORMANCE STATUS: 1 - Symptomatic but completely ambulatory  There were no vitals taken for this visit.  LABORATORY DATA: Lab Results  Component Value Date   WBC 5.2 05/12/2022   HGB 13.1 05/12/2022   HCT 37.2 (L) 05/12/2022   MCV 90.1 05/12/2022   PLT 171 05/12/2022      Chemistry      Component Value Date/Time   NA 136 05/05/2022 1319   NA 138 02/03/2021 0944   K 4.3 05/05/2022 1319   CL 105 05/05/2022 1319   CO2 27  05/05/2022 1319   BUN 11 05/05/2022 1319   BUN 13 02/03/2021 0944   CREATININE 0.80 05/05/2022 1319      Component Value Date/Time   CALCIUM 9.4 05/05/2022 1319   ALKPHOS 52 05/05/2022 1319   AST 18 05/05/2022 1319   ALT 16 05/05/2022 1319   BILITOT 0.6 05/05/2022 1319       RADIOGRAPHIC STUDIES: CT Chest W Contrast  Result Date: 04/19/2022 CLINICAL DATA:  Extensive stage small cell lung cancer. Interval chemotherapy. Restaging. * Tracking Code: BO * EXAM: CT CHEST, ABDOMEN, AND PELVIS WITH CONTRAST TECHNIQUE: Multidetector CT imaging of the chest, abdomen and pelvis was performed following the standard protocol during bolus administration of intravenous contrast. RADIATION DOSE REDUCTION: This exam was performed according to the departmental dose-optimization program which includes automated exposure control, adjustment of the mA and/or kV according to patient size and/or use of iterative reconstruction technique. CONTRAST:  171mL OMNIPAQUE IOHEXOL 300 MG/ML  SOLN COMPARISON:  03/03/2022 PET-CT. 02/08/2022 chest CT. 11/29/2019 CT angiogram of the chest, abdomen and pelvis. FINDINGS: CT CHEST FINDINGS Cardiovascular: Normal heart size. No significant pericardial effusion/thickening. Three-vessel coronary atherosclerosis. Right internal jugular Port-A-Cath terminates in the lower third of the SVC. Atherosclerotic nonaneurysmal thoracic aorta. Normal caliber pulmonary arteries. No central pulmonary emboli. Mediastinum/Nodes: No discrete thyroid nodules. Unremarkable esophagus. No axillary adenopathy. Mildly enlarged 1.0 cm lower right paratracheal node (series 2/image 30), stable from 03/03/2022 PET-CT. Enlarged 1.8 cm subcarinal node (series 2/image 35), slightly decreased from 2.0 cm on 03/03/2022 PET-CT. No new pathologically enlarged mediastinal nodes. Enlarged right infrahilar nodes, largest 2.1 cm (series 2/image 39), slightly decreased from 2.3 cm on 03/03/2022 PET-CT. No left hilar  adenopathy. Lungs/Pleura: No pneumothorax. No pleural effusion. Severe paraseptal and centrilobular emphysema. Irregular solid 1.2 cm right lower lobe nodule (series 4/image 103), decreased from 2.3 cm. Separate 0.6 cm right lower lobe nodule (series 4/image 94), decreased from 0.8 cm. No acute consolidative airspace disease. No new significant pulmonary nodules. Bandlike scarring versus atelectasis in medial right middle lobe is mildly increased. Musculoskeletal: No aggressive appearing focal osseous lesions. Right shoulder arthroplasty. Mild thoracic spondylosis. CT ABDOMEN PELVIS FINDINGS Hepatobiliary: Normal liver with no liver mass. Normal gallbladder with no radiopaque cholelithiasis. No biliary ductal dilatation. Pancreas: Normal, with no mass or duct dilation. Spleen: Normal size. No mass. Adrenals/Urinary Tract: No right adrenal nodules. Stable asymmetric mild left adrenal thickening without discrete left adrenal nodules. Normal kidneys with no hydronephrosis  and no renal mass. Normal bladder. Stomach/Bowel: Small hiatal hernia. Otherwise normal nondistended stomach. Normal caliber small bowel with no small bowel wall thickening. Normal appendix. Oral contrast transits to the colon. Moderate left colonic diverticulosis with no large bowel wall thickening or significant pericolonic fat stranding. Moderate diffuse colonic stool volume. Vascular/Lymphatic: Atherosclerotic abdominal aorta with 4.2 cm infrarenal abdominal aortic aneurysm status post aorto bi-iliac stent graft repair, not appreciably changed. Patent portal, splenic, hepatic and renal veins. No pathologically enlarged lymph nodes in the abdomen or pelvis. Reproductive: Top-normal size prostate. Other: No pneumoperitoneum, ascites or focal fluid collection. Musculoskeletal: New faint sclerosis in the right L4 vertebral body and right iliac bone at the sites of previous hypermetabolism, compatible with treatment effect. Marked lumbar degenerative  disc disease. No appreciable new bony lesions. IMPRESSION: 1. Mild partial interval positive response to therapy. 2. Right lower lobe pulmonary nodules are mildly decreased. 3. Mediastinal and right infrahilar adenopathy is stable to slightly decreased. 4. New faint sclerosis in the right L4 vertebral body and right iliac bone at the sites of previous hypermetabolism, compatible with treatment effect. 5. No new or progressive metastatic disease. 6. Moderate diffuse colonic stool volume, suggesting constipation. 7. Chronic findings include: Three-vessel coronary atherosclerosis. Small hiatal hernia. Moderate left colonic diverticulosis. Aortic Atherosclerosis (ICD10-I70.0) and Emphysema (ICD10-J43.9). Electronically Signed   By: Ilona Sorrel M.D.   On: 04/19/2022 20:09   CT Abdomen Pelvis W Contrast  Result Date: 04/19/2022 CLINICAL DATA:  Extensive stage small cell lung cancer. Interval chemotherapy. Restaging. * Tracking Code: BO * EXAM: CT CHEST, ABDOMEN, AND PELVIS WITH CONTRAST TECHNIQUE: Multidetector CT imaging of the chest, abdomen and pelvis was performed following the standard protocol during bolus administration of intravenous contrast. RADIATION DOSE REDUCTION: This exam was performed according to the departmental dose-optimization program which includes automated exposure control, adjustment of the mA and/or kV according to patient size and/or use of iterative reconstruction technique. CONTRAST:  128mL OMNIPAQUE IOHEXOL 300 MG/ML  SOLN COMPARISON:  03/03/2022 PET-CT. 02/08/2022 chest CT. 11/29/2019 CT angiogram of the chest, abdomen and pelvis. FINDINGS: CT CHEST FINDINGS Cardiovascular: Normal heart size. No significant pericardial effusion/thickening. Three-vessel coronary atherosclerosis. Right internal jugular Port-A-Cath terminates in the lower third of the SVC. Atherosclerotic nonaneurysmal thoracic aorta. Normal caliber pulmonary arteries. No central pulmonary emboli. Mediastinum/Nodes: No  discrete thyroid nodules. Unremarkable esophagus. No axillary adenopathy. Mildly enlarged 1.0 cm lower right paratracheal node (series 2/image 30), stable from 03/03/2022 PET-CT. Enlarged 1.8 cm subcarinal node (series 2/image 35), slightly decreased from 2.0 cm on 03/03/2022 PET-CT. No new pathologically enlarged mediastinal nodes. Enlarged right infrahilar nodes, largest 2.1 cm (series 2/image 39), slightly decreased from 2.3 cm on 03/03/2022 PET-CT. No left hilar adenopathy. Lungs/Pleura: No pneumothorax. No pleural effusion. Severe paraseptal and centrilobular emphysema. Irregular solid 1.2 cm right lower lobe nodule (series 4/image 103), decreased from 2.3 cm. Separate 0.6 cm right lower lobe nodule (series 4/image 94), decreased from 0.8 cm. No acute consolidative airspace disease. No new significant pulmonary nodules. Bandlike scarring versus atelectasis in medial right middle lobe is mildly increased. Musculoskeletal: No aggressive appearing focal osseous lesions. Right shoulder arthroplasty. Mild thoracic spondylosis. CT ABDOMEN PELVIS FINDINGS Hepatobiliary: Normal liver with no liver mass. Normal gallbladder with no radiopaque cholelithiasis. No biliary ductal dilatation. Pancreas: Normal, with no mass or duct dilation. Spleen: Normal size. No mass. Adrenals/Urinary Tract: No right adrenal nodules. Stable asymmetric mild left adrenal thickening without discrete left adrenal nodules. Normal kidneys with no hydronephrosis and no renal mass.  Normal bladder. Stomach/Bowel: Small hiatal hernia. Otherwise normal nondistended stomach. Normal caliber small bowel with no small bowel wall thickening. Normal appendix. Oral contrast transits to the colon. Moderate left colonic diverticulosis with no large bowel wall thickening or significant pericolonic fat stranding. Moderate diffuse colonic stool volume. Vascular/Lymphatic: Atherosclerotic abdominal aorta with 4.2 cm infrarenal abdominal aortic aneurysm status post  aorto bi-iliac stent graft repair, not appreciably changed. Patent portal, splenic, hepatic and renal veins. No pathologically enlarged lymph nodes in the abdomen or pelvis. Reproductive: Top-normal size prostate. Other: No pneumoperitoneum, ascites or focal fluid collection. Musculoskeletal: New faint sclerosis in the right L4 vertebral body and right iliac bone at the sites of previous hypermetabolism, compatible with treatment effect. Marked lumbar degenerative disc disease. No appreciable new bony lesions. IMPRESSION: 1. Mild partial interval positive response to therapy. 2. Right lower lobe pulmonary nodules are mildly decreased. 3. Mediastinal and right infrahilar adenopathy is stable to slightly decreased. 4. New faint sclerosis in the right L4 vertebral body and right iliac bone at the sites of previous hypermetabolism, compatible with treatment effect. 5. No new or progressive metastatic disease. 6. Moderate diffuse colonic stool volume, suggesting constipation. 7. Chronic findings include: Three-vessel coronary atherosclerosis. Small hiatal hernia. Moderate left colonic diverticulosis. Aortic Atherosclerosis (ICD10-I70.0) and Emphysema (ICD10-J43.9). Electronically Signed   By: Ilona Sorrel M.D.   On: 04/19/2022 20:09    ASSESSMENT AND PLAN: This is a very pleasant 76 years old white male with extensive stage (T1c, N2, M1C) small cell lung cancer presented with right lower lobe lung nodule in addition to right hilar and mediastinal lymphadenopathy and bone metastasis involving thoracic spines as well as the right iliac crest diagnosed in July 2023. The patient started systemic chemotherapy with carboplatin for AUC of 5 on day 1, etoposide 100 Mg/M2 on days 1, 2 and 3 as well as Cosela 240 Mg/M2 on the days of the chemotherapy and Imfinzi 1500 Mg IV every 3 weeks with the induction treatment.  He is status post 3 cycles. He has been tolerating this treatment well with no concerning adverse effects  except for mild fatigue. I recommended for him to proceed with cycle #4 today as planned. I will see him back for follow-up visit in 3 weeks for evaluation with repeat CT scan of the chest, abdomen and pelvis for restaging of his disease. The patient was advised to call immediately if he has any other concerning symptoms in the interval. The patient voices understanding of current disease status and treatment options and is in agreement with the current care plan.  All questions were answered. The patient knows to call the clinic with any problems, questions or concerns. We can certainly see the patient much sooner if necessary.  The total time spent in the appointment was 20 minutes.  Disclaimer: This note was dictated with voice recognition software. Similar sounding words can inadvertently be transcribed and may not be corrected upon review.

## 2022-05-13 ENCOUNTER — Inpatient Hospital Stay: Payer: Medicare HMO

## 2022-05-13 VITALS — BP 125/65 | Temp 98.0°F

## 2022-05-13 DIAGNOSIS — C3431 Malignant neoplasm of lower lobe, right bronchus or lung: Secondary | ICD-10-CM | POA: Diagnosis not present

## 2022-05-13 DIAGNOSIS — Z5111 Encounter for antineoplastic chemotherapy: Secondary | ICD-10-CM | POA: Diagnosis not present

## 2022-05-13 DIAGNOSIS — Z79899 Other long term (current) drug therapy: Secondary | ICD-10-CM | POA: Diagnosis not present

## 2022-05-13 DIAGNOSIS — C7951 Secondary malignant neoplasm of bone: Secondary | ICD-10-CM | POA: Diagnosis not present

## 2022-05-13 DIAGNOSIS — R739 Hyperglycemia, unspecified: Secondary | ICD-10-CM | POA: Diagnosis not present

## 2022-05-13 MED ORDER — SODIUM CHLORIDE 0.9 % IV SOLN: Freq: Once | INTRAVENOUS | Status: AC

## 2022-05-13 MED ORDER — SODIUM CHLORIDE 0.9 % IV SOLN
100.0000 mg/m2 | Freq: Once | INTRAVENOUS | Status: AC
  Administered 2022-05-13: 190 mg via INTRAVENOUS
  Filled 2022-05-13: qty 9.5

## 2022-05-13 MED ORDER — SODIUM CHLORIDE 0.9% FLUSH
10.0000 mL | INTRAVENOUS | Status: DC | PRN
  Administered 2022-05-13: 10 mL

## 2022-05-13 MED ORDER — SODIUM CHLORIDE 0.9 % IV SOLN
10.0000 mg | Freq: Once | INTRAVENOUS | Status: AC
  Administered 2022-05-13: 10 mg via INTRAVENOUS
  Filled 2022-05-13: qty 10

## 2022-05-13 MED ORDER — HEPARIN SOD (PORK) LOCK FLUSH 100 UNIT/ML IV SOLN
500.0000 [IU] | Freq: Once | INTRAVENOUS | Status: AC | PRN
  Administered 2022-05-13: 500 [IU]

## 2022-05-13 MED ORDER — TRILACICLIB DIHYDROCHLORIDE INJECTION 300 MG
240.0000 mg/m2 | Freq: Once | INTRAVENOUS | Status: AC
  Administered 2022-05-13: 465 mg via INTRAVENOUS
  Filled 2022-05-13: qty 31

## 2022-05-13 MED FILL — Dexamethasone Sodium Phosphate Inj 100 MG/10ML: INTRAMUSCULAR | Qty: 1 | Status: AC

## 2022-05-13 NOTE — Patient Instructions (Signed)
[facility]  Discharge Instructions: Thank you for choosing [facility] to provide your oncology and hematology care.   If you have a lab appointment with the [facility], please go directly to the [facility] and check in at the registration area.   Wear comfortable clothing and clothing appropriate for easy access to any Portacath or PICC line.   We strive to give you quality time with your provider. You may need to reschedule your appointment if you arrive late (15 or more minutes).  Arriving late affects you and other patients whose appointments are after yours.  Also, if you miss three or more appointments without notifying the office, you may be dismissed from the clinic at the provider's discretion.      For prescription refill requests, have your pharmacy contact our office and allow 72 hours for refills to be completed.    Today you received the following chemotherapy and/or immunotherapy agents Cosela and Etoposide      To help prevent nausea and vomiting after your treatment, we encourage you to take your nausea medication as directed.  BELOW ARE SYMPTOMS THAT SHOULD BE REPORTED IMMEDIATELY: *FEVER GREATER THAN 100.4 F (38 C) OR HIGHER *CHILLS OR SWEATING *NAUSEA AND VOMITING THAT IS NOT CONTROLLED WITH YOUR NAUSEA MEDICATION *UNUSUAL SHORTNESS OF BREATH *UNUSUAL BRUISING OR BLEEDING *URINARY PROBLEMS (pain or burning when urinating, or frequent urination) *BOWEL PROBLEMS (unusual diarrhea, constipation, pain near the anus) TENDERNESS IN MOUTH AND THROAT WITH OR WITHOUT PRESENCE OF ULCERS (sore throat, sores in mouth, or a toothache) UNUSUAL RASH, SWELLING OR PAIN  UNUSUAL VAGINAL DISCHARGE OR ITCHING   Items with * indicate a potential emergency and should be followed up as soon as possible or go to the Emergency Department if any problems should occur.  Please show the CHEMOTHERAPY ALERT CARD or IMMUNOTHERAPY ALERT CARD at  check-in to the Emergency Department and triage nurse.  Should you have questions after your visit or need to cancel or reschedule your appointment, please contact [facility]  Dept: [phone]  and follow the prompts.  Office hours are 8:00 a.m. to 4:30 p.m. Monday - Friday. Please note that voicemails left after 4:00 p.m. may not be returned until the following business day.  We are closed weekends and major holidays. You have access to a nurse at all times for urgent questions. Please call the main number to the clinic Dept: [phone] and follow the prompts.   For any non-urgent questions, you may also contact your provider using MyChart. We now offer e-Visits for anyone 85 and older to request care online for non-urgent symptoms. For details visit [email or web address].   Also download the MyChart app! Go to the app store, search "MyChart", open the app, select [facility], and log in with your MyChart username and password.  Masks are optional in the cancer centers. If you would like for your care team to wear a mask while they are taking care of you, please let them know. You may have one support person who is at least 74 years old accompany you for your appointments.

## 2022-05-14 ENCOUNTER — Inpatient Hospital Stay: Payer: Medicare HMO

## 2022-05-14 VITALS — BP 132/61 | HR 81 | Temp 98.2°F | Resp 16

## 2022-05-14 DIAGNOSIS — Z79899 Other long term (current) drug therapy: Secondary | ICD-10-CM | POA: Diagnosis not present

## 2022-05-14 DIAGNOSIS — C3431 Malignant neoplasm of lower lobe, right bronchus or lung: Secondary | ICD-10-CM | POA: Diagnosis not present

## 2022-05-14 DIAGNOSIS — R739 Hyperglycemia, unspecified: Secondary | ICD-10-CM | POA: Diagnosis not present

## 2022-05-14 DIAGNOSIS — C7951 Secondary malignant neoplasm of bone: Secondary | ICD-10-CM | POA: Diagnosis not present

## 2022-05-14 DIAGNOSIS — Z5111 Encounter for antineoplastic chemotherapy: Secondary | ICD-10-CM | POA: Diagnosis not present

## 2022-05-14 MED ORDER — SODIUM CHLORIDE 0.9 % IV SOLN
100.0000 mg/m2 | Freq: Once | INTRAVENOUS | Status: AC
  Administered 2022-05-14: 190 mg via INTRAVENOUS
  Filled 2022-05-14: qty 9.5

## 2022-05-14 MED ORDER — HEPARIN SOD (PORK) LOCK FLUSH 100 UNIT/ML IV SOLN
500.0000 [IU] | Freq: Once | INTRAVENOUS | Status: AC | PRN
  Administered 2022-05-14: 500 [IU]

## 2022-05-14 MED ORDER — SODIUM CHLORIDE 0.9 % IV SOLN: Freq: Once | INTRAVENOUS | Status: AC

## 2022-05-14 MED ORDER — SODIUM CHLORIDE 0.9% FLUSH
10.0000 mL | INTRAVENOUS | Status: DC | PRN
  Administered 2022-05-14: 10 mL

## 2022-05-14 MED ORDER — SODIUM CHLORIDE 0.9 % IV SOLN
10.0000 mg | Freq: Once | INTRAVENOUS | Status: AC
  Administered 2022-05-14: 10 mg via INTRAVENOUS
  Filled 2022-05-14: qty 10

## 2022-05-14 MED ORDER — TRILACICLIB DIHYDROCHLORIDE INJECTION 300 MG
240.0000 mg/m2 | Freq: Once | INTRAVENOUS | Status: AC
  Administered 2022-05-14: 465 mg via INTRAVENOUS
  Filled 2022-05-14: qty 31

## 2022-05-14 NOTE — Patient Instructions (Signed)
Adam Jordan  Discharge Instructions: Thank you for choosing [facility] to provide your Jordan and hematology care.   If you have a lab appointment with the Cancer Center, please go directly to the Cancer Center and check in at the registration area.   Wear comfortable clothing and clothing appropriate for easy access to any Portacath or PICC line.   We strive to give you quality time with your provider. You may need to reschedule your appointment if you arrive late (15 or more minutes).  Arriving late affects you and other patients whose appointments are after yours.  Also, if you miss three or more appointments without notifying the office, you may be dismissed from the clinic at the provider's discretion.      For prescription refill requests, have your pharmacy contact our office and allow 72 hours for refills to be completed.    Today you received the following chemotherapy and/or immunotherapy agents; Cosela & Etoposide      To help prevent nausea and vomiting after your treatment, we encourage you to take your nausea medication as directed.  BELOW ARE SYMPTOMS THAT SHOULD BE REPORTED IMMEDIATELY: *FEVER GREATER THAN 100.4 F (38 C) OR HIGHER *CHILLS OR SWEATING *NAUSEA AND VOMITING THAT IS NOT CONTROLLED WITH YOUR NAUSEA MEDICATION *UNUSUAL SHORTNESS OF BREATH *UNUSUAL BRUISING OR BLEEDING *URINARY PROBLEMS (pain or burning when urinating, or frequent urination) *BOWEL PROBLEMS (unusual diarrhea, constipation, pain near the anus) TENDERNESS IN MOUTH AND THROAT WITH OR WITHOUT PRESENCE OF ULCERS (sore throat, sores in mouth, or a toothache) UNUSUAL RASH, SWELLING OR PAIN  UNUSUAL VAGINAL DISCHARGE OR ITCHING   Items with * indicate a potential emergency and should be followed up as soon as possible or go to the Emergency Department if any problems should occur.  Please show the CHEMOTHERAPY ALERT CARD or IMMUNOTHERAPY ALERT CARD at  check-in to the Emergency Department and triage nurse.  Should you have questions after your visit or need to cancel or reschedule your appointment, please contact [facility]  Dept: [phone]  and follow the prompts.  Office hours are 8:00 a.m. to 4:30 p.m. Monday - Friday. Please note that voicemails left after 4:00 p.m. may not be returned until the following business day.  We are closed weekends and major holidays. You have access to a nurse at all times for urgent questions. Please call the main number to the clinic Dept: [phone] and follow the prompts.   For any non-urgent questions, you may also contact your provider using MyChart. We now offer e-Visits for anyone 18 and older to request care online for non-urgent symptoms. For details visit [email or web address].   Also download the MyChart app! Go to the app store, search "MyChart", open the app, select [place], and log in with your MyChart username and password.  Masks are optional in the cancer centers. If you would like for your care team to wear a mask while they are taking care of you, please let them know. You may have one support person who is at least 75 years old accompany you for your appointments. 

## 2022-05-19 ENCOUNTER — Inpatient Hospital Stay: Payer: Medicare HMO

## 2022-05-19 DIAGNOSIS — Z5111 Encounter for antineoplastic chemotherapy: Secondary | ICD-10-CM | POA: Diagnosis not present

## 2022-05-19 DIAGNOSIS — C3431 Malignant neoplasm of lower lobe, right bronchus or lung: Secondary | ICD-10-CM

## 2022-05-19 DIAGNOSIS — Z79899 Other long term (current) drug therapy: Secondary | ICD-10-CM | POA: Diagnosis not present

## 2022-05-19 DIAGNOSIS — C7951 Secondary malignant neoplasm of bone: Secondary | ICD-10-CM | POA: Diagnosis not present

## 2022-05-19 DIAGNOSIS — R739 Hyperglycemia, unspecified: Secondary | ICD-10-CM | POA: Diagnosis not present

## 2022-05-19 LAB — CMP (CANCER CENTER ONLY)
ALT: 19 U/L (ref 0–44)
AST: 13 U/L — ABNORMAL LOW (ref 15–41)
Albumin: 4.3 g/dL (ref 3.5–5.0)
Alkaline Phosphatase: 67 U/L (ref 38–126)
Anion gap: 12 (ref 5–15)
BUN: 22 mg/dL (ref 8–23)
CO2: 24 mmol/L (ref 22–32)
Calcium: 9.6 mg/dL (ref 8.9–10.3)
Chloride: 99 mmol/L (ref 98–111)
Creatinine: 0.88 mg/dL (ref 0.61–1.24)
GFR, Estimated: >60 mL/min (ref >60)
Glucose, Bld: 445 mg/dL — ABNORMAL HIGH (ref 70–99)
Potassium: 4.7 mmol/L (ref 3.5–5.1)
Sodium: 135 mmol/L (ref 135–145)
Total Bilirubin: 0.8 mg/dL (ref 0.3–1.2)
Total Protein: 6.9 g/dL (ref 6.5–8.1)

## 2022-05-19 LAB — CBC WITH DIFFERENTIAL (CANCER CENTER ONLY)
Abs Immature Granulocytes: 0.1 10*3/uL — ABNORMAL HIGH (ref 0.00–0.07)
Basophils Absolute: 0.1 10*3/uL (ref 0.0–0.1)
Basophils Relative: 1 %
Eosinophils Absolute: 0 10*3/uL (ref 0.0–0.5)
Eosinophils Relative: 0 %
HCT: 38.3 % — ABNORMAL LOW (ref 39.0–52.0)
Hemoglobin: 13.5 g/dL (ref 13.0–17.0)
Immature Granulocytes: 1 %
Lymphocytes Relative: 14 %
Lymphs Abs: 1.2 10*3/uL (ref 0.7–4.0)
MCH: 31.8 pg (ref 26.0–34.0)
MCHC: 35.2 g/dL (ref 30.0–36.0)
MCV: 90.1 fL (ref 80.0–100.0)
Monocytes Absolute: 0.2 10*3/uL (ref 0.1–1.0)
Monocytes Relative: 2 %
Neutro Abs: 7.5 10*3/uL (ref 1.7–7.7)
Neutrophils Relative %: 82 %
Platelet Count: 140 10*3/uL — ABNORMAL LOW (ref 150–400)
RBC: 4.25 MIL/uL (ref 4.22–5.81)
RDW: 13.9 % (ref 11.5–15.5)
WBC Count: 9.1 10*3/uL (ref 4.0–10.5)
nRBC: 0 % (ref 0.0–0.2)

## 2022-05-24 ENCOUNTER — Telehealth: Payer: Self-pay | Admitting: Medical Oncology

## 2022-05-24 NOTE — Telephone Encounter (Signed)
Symptomatic-Periodic dizziness , muscle weakness , fell 3 days ago , ears feel  "stopped up" . States his throat feels swollen.  Last chemo 2 weeks ago .  He is only eating soups and ensure. He is not having any trouble breathing. He recently started using a walker instead of his cane.  Lab appt tomorrow.

## 2022-05-24 NOTE — Telephone Encounter (Signed)
Per Dr Julien Nordmann , I told pt to keep his lab appt on wed. I gave phone number to CS.

## 2022-05-26 ENCOUNTER — Other Ambulatory Visit: Payer: Self-pay | Admitting: Internal Medicine

## 2022-05-26 ENCOUNTER — Telehealth: Payer: Self-pay | Admitting: Medical Oncology

## 2022-05-26 ENCOUNTER — Other Ambulatory Visit: Payer: Self-pay | Admitting: Medical Oncology

## 2022-05-26 ENCOUNTER — Telehealth: Payer: Self-pay

## 2022-05-26 ENCOUNTER — Inpatient Hospital Stay: Payer: Medicare HMO

## 2022-05-26 VITALS — BP 139/71 | HR 72 | Resp 17

## 2022-05-26 DIAGNOSIS — Z95828 Presence of other vascular implants and grafts: Secondary | ICD-10-CM

## 2022-05-26 DIAGNOSIS — E1110 Type 2 diabetes mellitus with ketoacidosis without coma: Secondary | ICD-10-CM | POA: Diagnosis not present

## 2022-05-26 DIAGNOSIS — C3431 Malignant neoplasm of lower lobe, right bronchus or lung: Secondary | ICD-10-CM

## 2022-05-26 DIAGNOSIS — R739 Hyperglycemia, unspecified: Secondary | ICD-10-CM

## 2022-05-26 DIAGNOSIS — E1165 Type 2 diabetes mellitus with hyperglycemia: Secondary | ICD-10-CM

## 2022-05-26 LAB — CBC WITH DIFFERENTIAL (CANCER CENTER ONLY)
Abs Immature Granulocytes: 0.05 10*3/uL (ref 0.00–0.07)
Basophils Absolute: 0 10*3/uL (ref 0.0–0.1)
Basophils Relative: 1 %
Eosinophils Absolute: 0 10*3/uL (ref 0.0–0.5)
Eosinophils Relative: 0 %
HCT: 34.3 % — ABNORMAL LOW (ref 39.0–52.0)
Hemoglobin: 11.8 g/dL — ABNORMAL LOW (ref 13.0–17.0)
Immature Granulocytes: 1 %
Lymphocytes Relative: 14 %
Lymphs Abs: 0.8 10*3/uL (ref 0.7–4.0)
MCH: 31.6 pg (ref 26.0–34.0)
MCHC: 34.4 g/dL (ref 30.0–36.0)
MCV: 92 fL (ref 80.0–100.0)
Monocytes Absolute: 0.8 10*3/uL (ref 0.1–1.0)
Monocytes Relative: 13 %
Neutro Abs: 4.2 10*3/uL (ref 1.7–7.7)
Neutrophils Relative %: 71 %
Platelet Count: 101 10*3/uL — ABNORMAL LOW (ref 150–400)
RBC: 3.73 MIL/uL — ABNORMAL LOW (ref 4.22–5.81)
RDW: 13.8 % (ref 11.5–15.5)
WBC Count: 6 10*3/uL (ref 4.0–10.5)
nRBC: 0 % (ref 0.0–0.2)

## 2022-05-26 LAB — CMP (CANCER CENTER ONLY)
ALT: 23 U/L (ref 0–44)
AST: 15 U/L (ref 15–41)
Albumin: 4.1 g/dL (ref 3.5–5.0)
Alkaline Phosphatase: 68 U/L (ref 38–126)
Anion gap: 20 — ABNORMAL HIGH (ref 5–15)
BUN: 26 mg/dL — ABNORMAL HIGH (ref 8–23)
CO2: 16 mmol/L — ABNORMAL LOW (ref 22–32)
Calcium: 9.7 mg/dL (ref 8.9–10.3)
Chloride: 96 mmol/L — ABNORMAL LOW (ref 98–111)
Creatinine: 1.01 mg/dL (ref 0.61–1.24)
GFR, Estimated: >60 mL/min (ref >60)
Glucose, Bld: 547 mg/dL (ref 70–99)
Potassium: 5 mmol/L (ref 3.5–5.1)
Sodium: 132 mmol/L — ABNORMAL LOW (ref 135–145)
Total Bilirubin: 0.5 mg/dL (ref 0.3–1.2)
Total Protein: 6.7 g/dL (ref 6.5–8.1)

## 2022-05-26 LAB — URINALYSIS, ROUTINE W REFLEX MICROSCOPIC
Bacteria, UA: NONE SEEN
Bilirubin Urine: NEGATIVE
Glucose, UA: >=500 mg/dL — AB
Hgb urine dipstick: NEGATIVE
Ketones, ur: 80 mg/dL — AB
Leukocytes,Ua: NEGATIVE
Nitrite: NEGATIVE
Protein, ur: NEGATIVE mg/dL
Specific Gravity, Urine: 1.024 (ref 1.005–1.030)
pH: 5 (ref 5.0–8.0)

## 2022-05-26 MED ORDER — SODIUM CHLORIDE 0.9% FLUSH
10.0000 mL | Freq: Once | INTRAVENOUS | Status: AC
  Administered 2022-05-26: 10 mL

## 2022-05-26 MED ORDER — HEPARIN SOD (PORK) LOCK FLUSH 100 UNIT/ML IV SOLN
500.0000 [IU] | Freq: Once | INTRAVENOUS | Status: AC
  Administered 2022-05-26: 500 [IU]

## 2022-05-26 MED ORDER — SODIUM CHLORIDE 0.9 % IV SOLN: INTRAVENOUS | Status: DC

## 2022-05-26 MED ORDER — INSULIN ASPART 100 UNIT/ML IJ SOLN
10.0000 [IU] | Freq: Once | INTRAMUSCULAR | Status: AC
  Administered 2022-05-26: 10 [IU] via SUBCUTANEOUS
  Filled 2022-05-26: qty 1

## 2022-05-26 NOTE — Telephone Encounter (Signed)
Appt with Dr [facility] Friday 10/27 at 1200. Pt notified.

## 2022-05-26 NOTE — Telephone Encounter (Signed)
Pt referred to Endocrinology for hyperglycemia. LVM to medical assistance to return my call.

## 2022-05-26 NOTE — Patient Instructions (Signed)

## 2022-05-26 NOTE — Telephone Encounter (Signed)
Dr. Julien Nordmann nurse Diane is calling to see if patient can get a urgent appointment for immunotherapy mediated type 1 diabetes mellitus. Patient blood sugar was 547 today.

## 2022-05-26 NOTE — Telephone Encounter (Signed)
-----   Message from Ardeen Garland, RN sent at 05/24/2022  4:15 PM EDT ----- F/u labs

## 2022-05-26 NOTE — Telephone Encounter (Signed)
Pt seen in blue room . He is ina wheelchair. Today he feels "terrible". His wife states he has fallen 3 times in 2 days, he has muscle weakness and overall he feels weak He cannot walk well at all. Throat still feels "swollen".  Wife states he stays thirsty all the time and urinates a lot. Glucose 547.  Per Dr Julien Nordmann obtain urine sample and urgent referral to endrocrinology.

## 2022-05-26 NOTE — Telephone Encounter (Signed)
CRITICAL VALUE STICKER  CRITICAL VALUE: Glucose 547   RECEIVER (on-site recipient of call):  Gwendalynn Eckstrom P. LPN  DATE & TIME NOTIFIED: 05/26/22 2:08pm  MESSENGER (representative from lab): Lelan Pons  MD NOTIFIED: Dr. Julien Nordmann

## 2022-05-27 ENCOUNTER — Encounter (HOSPITAL_COMMUNITY): Payer: Self-pay | Admitting: Emergency Medicine

## 2022-05-27 ENCOUNTER — Telehealth: Payer: Self-pay | Admitting: Medical Oncology

## 2022-05-27 ENCOUNTER — Other Ambulatory Visit: Payer: Self-pay

## 2022-05-27 ENCOUNTER — Inpatient Hospital Stay (HOSPITAL_COMMUNITY)
Admission: EM | Admit: 2022-05-27 | Discharge: 2022-05-29 | DRG: 638 | Disposition: A | Discharge status: Home / Self Care | Payer: Medicare HMO | Attending: Internal Medicine | Admitting: Internal Medicine

## 2022-05-27 ENCOUNTER — Emergency Department (HOSPITAL_COMMUNITY): Payer: Medicare HMO

## 2022-05-27 ENCOUNTER — Ambulatory Visit (HOSPITAL_COMMUNITY)
Admission: RE | Admit: 2022-05-27 | Discharge: 2022-05-27 | Disposition: A | Discharge status: Home / Self Care | Payer: Medicare HMO | Source: Ambulatory Visit | Attending: Internal Medicine | Admitting: Internal Medicine

## 2022-05-27 ENCOUNTER — Inpatient Hospital Stay (HOSPITAL_COMMUNITY): Payer: Medicare HMO

## 2022-05-27 DIAGNOSIS — Z8249 Family history of ischemic heart disease and other diseases of the circulatory system: Secondary | ICD-10-CM

## 2022-05-27 DIAGNOSIS — E119 Type 2 diabetes mellitus without complications: Secondary | ICD-10-CM

## 2022-05-27 DIAGNOSIS — D696 Thrombocytopenia, unspecified: Secondary | ICD-10-CM | POA: Diagnosis not present

## 2022-05-27 DIAGNOSIS — K219 Gastro-esophageal reflux disease without esophagitis: Secondary | ICD-10-CM | POA: Diagnosis present

## 2022-05-27 DIAGNOSIS — T451X5A Adverse effect of antineoplastic and immunosuppressive drugs, initial encounter: Secondary | ICD-10-CM | POA: Diagnosis present

## 2022-05-27 DIAGNOSIS — R1310 Dysphagia, unspecified: Secondary | ICD-10-CM | POA: Diagnosis not present

## 2022-05-27 DIAGNOSIS — E1110 Type 2 diabetes mellitus with ketoacidosis without coma: Secondary | ICD-10-CM | POA: Diagnosis not present

## 2022-05-27 DIAGNOSIS — F419 Anxiety disorder, unspecified: Secondary | ICD-10-CM | POA: Diagnosis not present

## 2022-05-27 DIAGNOSIS — R590 Localized enlarged lymph nodes: Secondary | ICD-10-CM | POA: Diagnosis not present

## 2022-05-27 DIAGNOSIS — I4892 Unspecified atrial flutter: Secondary | ICD-10-CM | POA: Diagnosis not present

## 2022-05-27 DIAGNOSIS — Z7901 Long term (current) use of anticoagulants: Secondary | ICD-10-CM

## 2022-05-27 DIAGNOSIS — M1990 Unspecified osteoarthritis, unspecified site: Secondary | ICD-10-CM | POA: Diagnosis present

## 2022-05-27 DIAGNOSIS — J449 Chronic obstructive pulmonary disease, unspecified: Secondary | ICD-10-CM | POA: Diagnosis present

## 2022-05-27 DIAGNOSIS — C3490 Malignant neoplasm of unspecified part of unspecified bronchus or lung: Secondary | ICD-10-CM | POA: Diagnosis not present

## 2022-05-27 DIAGNOSIS — Z87891 Personal history of nicotine dependence: Secondary | ICD-10-CM

## 2022-05-27 DIAGNOSIS — E1010 Type 1 diabetes mellitus with ketoacidosis without coma: Principal | ICD-10-CM

## 2022-05-27 DIAGNOSIS — Z803 Family history of malignant neoplasm of breast: Secondary | ICD-10-CM | POA: Diagnosis not present

## 2022-05-27 DIAGNOSIS — R531 Weakness: Secondary | ICD-10-CM | POA: Diagnosis not present

## 2022-05-27 DIAGNOSIS — Z96653 Presence of artificial knee joint, bilateral: Secondary | ICD-10-CM | POA: Diagnosis present

## 2022-05-27 DIAGNOSIS — I10 Essential (primary) hypertension: Secondary | ICD-10-CM | POA: Diagnosis not present

## 2022-05-27 DIAGNOSIS — E876 Hypokalemia: Secondary | ICD-10-CM | POA: Diagnosis present

## 2022-05-27 DIAGNOSIS — J9811 Atelectasis: Secondary | ICD-10-CM | POA: Diagnosis not present

## 2022-05-27 DIAGNOSIS — R911 Solitary pulmonary nodule: Secondary | ICD-10-CM | POA: Diagnosis not present

## 2022-05-27 DIAGNOSIS — E785 Hyperlipidemia, unspecified: Secondary | ICD-10-CM | POA: Diagnosis present

## 2022-05-27 DIAGNOSIS — D649 Anemia, unspecified: Secondary | ICD-10-CM | POA: Diagnosis not present

## 2022-05-27 DIAGNOSIS — E7800 Pure hypercholesterolemia, unspecified: Secondary | ICD-10-CM | POA: Diagnosis present

## 2022-05-27 DIAGNOSIS — Z79899 Other long term (current) drug therapy: Secondary | ICD-10-CM

## 2022-05-27 DIAGNOSIS — Z888 Allergy status to other drugs, medicaments and biological substances status: Secondary | ICD-10-CM

## 2022-05-27 DIAGNOSIS — Z95828 Presence of other vascular implants and grafts: Secondary | ICD-10-CM

## 2022-05-27 DIAGNOSIS — I716 Thoracoabdominal aortic aneurysm, without rupture, unspecified: Secondary | ICD-10-CM | POA: Diagnosis not present

## 2022-05-27 DIAGNOSIS — C3431 Malignant neoplasm of lower lobe, right bronchus or lung: Secondary | ICD-10-CM | POA: Diagnosis present

## 2022-05-27 DIAGNOSIS — Z8041 Family history of malignant neoplasm of ovary: Secondary | ICD-10-CM

## 2022-05-27 DIAGNOSIS — J432 Centrilobular emphysema: Secondary | ICD-10-CM | POA: Diagnosis not present

## 2022-05-27 DIAGNOSIS — I700 Atherosclerosis of aorta: Secondary | ICD-10-CM | POA: Diagnosis not present

## 2022-05-27 DIAGNOSIS — Z791 Long term (current) use of non-steroidal anti-inflammatories (NSAID): Secondary | ICD-10-CM

## 2022-05-27 DIAGNOSIS — K5730 Diverticulosis of large intestine without perforation or abscess without bleeding: Secondary | ICD-10-CM | POA: Diagnosis not present

## 2022-05-27 HISTORY — DX: Dysphagia, unspecified: R13.10

## 2022-05-27 HISTORY — DX: Type 2 diabetes mellitus with ketoacidosis without coma: E11.10

## 2022-05-27 HISTORY — DX: Thrombocytopenia, unspecified: D69.6

## 2022-05-27 HISTORY — DX: Type 2 diabetes mellitus without complications: E11.9

## 2022-05-27 LAB — BASIC METABOLIC PANEL
Anion gap: 11 (ref 5–15)
Anion gap: 5 (ref 5–15)
BUN: 23 mg/dL (ref 8–23)
BUN: 18 mg/dL (ref 8–23)
CO2: 19 mmol/L — ABNORMAL LOW (ref 22–32)
CO2: 24 mmol/L (ref 22–32)
Calcium: 9.2 mg/dL (ref 8.9–10.3)
Calcium: 8.9 mg/dL (ref 8.9–10.3)
Chloride: 105 mmol/L (ref 98–111)
Chloride: 106 mmol/L (ref 98–111)
Creatinine, Ser: 0.89 mg/dL (ref 0.61–1.24)
Creatinine, Ser: 0.71 mg/dL (ref 0.61–1.24)
GFR, Estimated: >60 mL/min (ref >60)
GFR, Estimated: >60 mL/min (ref >60)
Glucose, Bld: 453 mg/dL — ABNORMAL HIGH (ref 70–99)
Glucose, Bld: 134 mg/dL — ABNORMAL HIGH (ref 70–99)
Potassium: 4.6 mmol/L (ref 3.5–5.1)
Potassium: 3.8 mmol/L (ref 3.5–5.1)
Sodium: 135 mmol/L (ref 135–145)
Sodium: 135 mmol/L (ref 135–145)

## 2022-05-27 LAB — GLUCOSE, CAPILLARY
Glucose-Capillary: 216 mg/dL — ABNORMAL HIGH (ref 70–99)
Glucose-Capillary: 177 mg/dL — ABNORMAL HIGH (ref 70–99)
Glucose-Capillary: 161 mg/dL — ABNORMAL HIGH (ref 70–99)

## 2022-05-27 LAB — DIFFERENTIAL
Abs Immature Granulocytes: 0.03 10*3/uL (ref 0.00–0.07)
Basophils Absolute: 0 10*3/uL (ref 0.0–0.1)
Basophils Relative: 1 %
Eosinophils Absolute: 0.1 10*3/uL (ref 0.0–0.5)
Eosinophils Relative: 1 %
Immature Granulocytes: 1 %
Lymphocytes Relative: 20 %
Lymphs Abs: 0.9 10*3/uL (ref 0.7–4.0)
Monocytes Absolute: 0.8 10*3/uL (ref 0.1–1.0)
Monocytes Relative: 16 %
Neutro Abs: 3 10*3/uL (ref 1.7–7.7)
Neutrophils Relative %: 61 %

## 2022-05-27 LAB — HEMOGLOBIN A1C
Hgb A1c MFr Bld: 10.1 % — ABNORMAL HIGH (ref 4.8–5.6)
Mean Plasma Glucose: 243.17 mg/dL

## 2022-05-27 LAB — BLOOD GAS, VENOUS
Acid-base deficit: 6.4 mmol/L — ABNORMAL HIGH (ref 0.0–2.0)
Bicarbonate: 19.1 mmol/L — ABNORMAL LOW (ref 20.0–28.0)
O2 Saturation: 67 %
Patient temperature: 37
pCO2, Ven: 37 mmHg — ABNORMAL LOW (ref 44–60)
pH, Ven: 7.32 (ref 7.25–7.43)
pO2, Ven: 38 mmHg (ref 32–45)

## 2022-05-27 LAB — CBC
HCT: 30.4 % — ABNORMAL LOW (ref 39.0–52.0)
Hemoglobin: 10.3 g/dL — ABNORMAL LOW (ref 13.0–17.0)
MCH: 32 pg (ref 26.0–34.0)
MCHC: 33.9 g/dL (ref 30.0–36.0)
MCV: 94.4 fL (ref 80.0–100.0)
Platelets: 91 10*3/uL — ABNORMAL LOW (ref 150–400)
RBC: 3.22 MIL/uL — ABNORMAL LOW (ref 4.22–5.81)
RDW: 14.2 % (ref 11.5–15.5)
WBC: 4.8 10*3/uL (ref 4.0–10.5)
nRBC: 0 % (ref 0.0–0.2)

## 2022-05-27 LAB — URINALYSIS, ROUTINE W REFLEX MICROSCOPIC
Bilirubin Urine: NEGATIVE
Glucose, UA: >=500 mg/dL — AB
Hgb urine dipstick: NEGATIVE
Ketones, ur: 80 mg/dL — AB
Leukocytes,Ua: NEGATIVE
Nitrite: NEGATIVE
Protein, ur: NEGATIVE mg/dL
Specific Gravity, Urine: 1.023 (ref 1.005–1.030)
pH: 5 (ref 5.0–8.0)

## 2022-05-27 LAB — CBG MONITORING, ED
Glucose-Capillary: 480 mg/dL — ABNORMAL HIGH (ref 70–99)
Glucose-Capillary: 359 mg/dL — ABNORMAL HIGH (ref 70–99)
Glucose-Capillary: 146 mg/dL — ABNORMAL HIGH (ref 70–99)

## 2022-05-27 LAB — TROPONIN I (HIGH SENSITIVITY)
Troponin I (High Sensitivity): 16 ng/L (ref <18)
Troponin I (High Sensitivity): 16 ng/L (ref <18)

## 2022-05-27 LAB — BETA-HYDROXYBUTYRIC ACID
Beta-Hydroxybutyric Acid: 6.29 mmol/L — ABNORMAL HIGH (ref 0.05–0.27)
Beta-Hydroxybutyric Acid: 1.26 mmol/L — ABNORMAL HIGH (ref 0.05–0.27)

## 2022-05-27 MED ORDER — INSULIN REGULAR(HUMAN) IN NACL 100-0.9 UT/100ML-% IV SOLN
INTRAVENOUS | Status: DC
  Administered 2022-05-27: 10.5 [IU]/h via INTRAVENOUS
  Administered 2022-05-27: 0.6 [IU]/h via INTRAVENOUS
  Filled 2022-05-27: qty 100

## 2022-05-27 MED ORDER — ORAL CARE MOUTH RINSE: 15.0000 mL | OROMUCOSAL | Status: DC | PRN

## 2022-05-27 MED ORDER — PROCHLORPERAZINE MALEATE 10 MG PO TABS: 10.0000 mg | ORAL_TABLET | Freq: Four times a day (QID) | ORAL | Status: DC | PRN

## 2022-05-27 MED ORDER — INSULIN ASPART 100 UNIT/ML IJ SOLN
0.0000 [IU] | Freq: Every day | INTRAMUSCULAR | Status: DC
  Administered 2022-05-28: 5 [IU] via SUBCUTANEOUS

## 2022-05-27 MED ORDER — IOHEXOL 300 MG/ML  SOLN
100.0000 mL | Freq: Once | INTRAMUSCULAR | Status: AC | PRN
  Administered 2022-05-27: 100 mL via INTRAVENOUS

## 2022-05-27 MED ORDER — CHLORHEXIDINE GLUCONATE CLOTH 2 % EX PADS
6.0000 | MEDICATED_PAD | Freq: Every day | CUTANEOUS | Status: DC
  Administered 2022-05-28: 6 via TOPICAL

## 2022-05-27 MED ORDER — DEXTROSE 50 % IV SOLN: 0.0000 mL | INTRAVENOUS | Status: DC | PRN

## 2022-05-27 MED ORDER — LACTATED RINGERS IV SOLN: INTRAVENOUS | Status: DC

## 2022-05-27 MED ORDER — ALBUTEROL SULFATE HFA 108 (90 BASE) MCG/ACT IN AERS: 2.0000 | INHALATION_SPRAY | Freq: Four times a day (QID) | RESPIRATORY_TRACT | Status: DC | PRN

## 2022-05-27 MED ORDER — LACTATED RINGERS IV BOLUS
20.0000 mL/kg | Freq: Once | INTRAVENOUS | Status: AC
  Administered 2022-05-27: 1406 mL via INTRAVENOUS

## 2022-05-27 MED ORDER — INSULIN ASPART 100 UNIT/ML IJ SOLN
0.0000 [IU] | Freq: Three times a day (TID) | INTRAMUSCULAR | Status: DC
  Administered 2022-05-28: 2 [IU] via SUBCUTANEOUS
  Administered 2022-05-28: 5 [IU] via SUBCUTANEOUS

## 2022-05-27 MED ORDER — TRAZODONE HCL 50 MG PO TABS
50.0000 mg | ORAL_TABLET | Freq: Every day | ORAL | Status: DC
  Administered 2022-05-28: 50 mg via ORAL
  Filled 2022-05-27 (×2): qty 1

## 2022-05-27 MED ORDER — CLONAZEPAM 1 MG PO TABS
1.0000 mg | ORAL_TABLET | Freq: Two times a day (BID) | ORAL | Status: DC | PRN
  Administered 2022-05-27: 1 mg via ORAL
  Filled 2022-05-27: qty 1

## 2022-05-27 MED ORDER — LIVING WELL WITH DIABETES BOOK
Freq: Once | Status: AC
  Filled 2022-05-27: qty 1

## 2022-05-27 MED ORDER — DEXTROSE IN LACTATED RINGERS 5 % IV SOLN: INTRAVENOUS | Status: DC

## 2022-05-27 MED ORDER — LACTATED RINGERS IV BOLUS: 20.0000 mL/kg | Freq: Once | INTRAVENOUS | Status: AC

## 2022-05-27 MED ORDER — IOHEXOL 9 MG/ML PO SOLN
ORAL | Status: AC
  Filled 2022-05-27: qty 1000

## 2022-05-27 MED ORDER — FLUTICASONE PROPIONATE 50 MCG/ACT NA SUSP: 1.0000 | Freq: Two times a day (BID) | NASAL | Status: DC | PRN

## 2022-05-27 MED ORDER — METOPROLOL TARTRATE 5 MG/5ML IV SOLN: 5.0000 mg | Freq: Four times a day (QID) | INTRAVENOUS | Status: DC | PRN

## 2022-05-27 MED ORDER — IOHEXOL 9 MG/ML PO SOLN
500.0000 mL | ORAL | Status: AC
  Administered 2022-05-27 (×2): 500 mL via ORAL

## 2022-05-27 MED ORDER — INSULIN STARTER KIT- PEN NEEDLES (ENGLISH)
1.0000 | Freq: Once | Status: AC
  Administered 2022-05-27: 1
  Filled 2022-05-27: qty 1

## 2022-05-27 MED ORDER — INSULIN GLARGINE-YFGN 100 UNIT/ML ~~LOC~~ SOLN
5.0000 [IU] | Freq: Every day | SUBCUTANEOUS | Status: DC
  Administered 2022-05-27 – 2022-05-28 (×2): 5 [IU] via SUBCUTANEOUS
  Filled 2022-05-27 (×2): qty 0.05

## 2022-05-27 MED ORDER — SODIUM CHLORIDE (PF) 0.9 % IJ SOLN
INTRAMUSCULAR | Status: AC
  Filled 2022-05-27: qty 50

## 2022-05-27 MED ORDER — ALBUTEROL SULFATE (2.5 MG/3ML) 0.083% IN NEBU: 2.5000 mg | INHALATION_SOLUTION | Freq: Four times a day (QID) | RESPIRATORY_TRACT | Status: DC | PRN

## 2022-05-27 MED ORDER — ATORVASTATIN CALCIUM 20 MG PO TABS
20.0000 mg | ORAL_TABLET | Freq: Every day | ORAL | Status: DC
  Administered 2022-05-27 – 2022-05-28 (×2): 20 mg via ORAL
  Filled 2022-05-27 (×2): qty 1

## 2022-05-27 MED ORDER — APIXABAN 5 MG PO TABS
5.0000 mg | ORAL_TABLET | Freq: Two times a day (BID) | ORAL | Status: DC
  Administered 2022-05-27 – 2022-05-29 (×4): 5 mg via ORAL
  Filled 2022-05-27 (×4): qty 1

## 2022-05-27 NOTE — ED Triage Notes (Signed)
Pt reports being sent by [facility] center for high protein levels & hyperglycemia. CBG in triage 480. Pt reports no hx of diabetes. Pt also reports weakness & 2 recent falls.  Hx lung Ca. Last chemo treatment 2 weeks ago.

## 2022-05-27 NOTE — Telephone Encounter (Signed)
LVM on pts phone of new appt with Dr [facility] for Nov 8th and arrive @ 1145.

## 2022-05-27 NOTE — H&P (Signed)
History and Physical    Patient: Adam Jordan:[id number] DOB: 1946-06-16 DOA: 05/27/2022 DOS: the patient was seen and examined on 05/27/2022 PCP: [name], DO  Patient coming from: Home  Chief Complaint:  Chief Complaint  Patient presents with   Hyperglycemia   Weakness   HPI: Adam Jordan is a 76 y.o. male with medical history significant of small cell carcinoma of lung,  HLD, COPD, TAAA, HTN, a flutter. Presenting with fatigue. He reports that his last chemo was 2 weeks ago. Since that time, he has felt increased fatigue. He says he feels "foggy". During this time, he's also had difficulty swallowing solid foods; and thus, his PO intake has been poor. He went to his onco clinic yesterday for check up. They draw blood work and it was concerning for DKA. He has no previous history of DM. They recommended that he come to the ED for evaluation. He denies any other aggravating or alleviating factors.   Review of Systems: As mentioned in the history of present illness. All other systems reviewed and are negative. Past Medical History:  Diagnosis Date   Anxiety    Arthritis    osteoarthrititis- knees and most joints.   COPD (chronic obstructive pulmonary disease) (HCC)    moderate -no regular use of inhalers- rare use of oxygen as sexual activity   Dyspnea    outside in hot weather and also with pollen   Hyperlipidemia    Hypertension    Neuromuscular disorder ([place])    feet   Paroxysmal atrial flutter ([place]) 01/15/2021   Thoracoabdominal aneurysm ([place])    s/p FEVAR 4 Vessel TABME 02/19/20 Dr. Katy Apo   Past Surgical History:  Procedure Laterality Date   ABDOMINAL AORTIC ANEURYSM REPAIR  02/06/2020   BRONCHIAL BIOPSY  02/23/2022   Procedure: BRONCHIAL BIOPSIES;  Surgeon: Collene Gobble, MD;  Location: [facility] ENDOSCOPY;  Service: Pulmonary;;   BRONCHIAL BRUSHINGS  02/23/2022   Procedure: BRONCHIAL BRUSHINGS;  Surgeon: Collene Gobble, MD;  Location: [facility] ENDOSCOPY;   Service: Pulmonary;;   BRONCHIAL NEEDLE ASPIRATION BIOPSY  02/23/2022   Procedure: BRONCHIAL NEEDLE ASPIRATION BIOPSIES;  Surgeon: Collene Gobble, MD;  Location: [place] ENDOSCOPY;  Service: Pulmonary;;   BROW LIFT Bilateral 12/11/2021   Procedure: UPPER LID BLEPHAROPLASTY;  Surgeon: Irene Limbo, MD;  Location: [place];  Service: Plastics;  Laterality: Bilateral;   CYST REMOVAL LEG Left 07/03/2021   Procedure: EXCISION CYST LEFT BUTTOCK;  Surgeon: Jovita Kussmaul, MD;  Location: [place];  Service: General;  Laterality: Left;   EYE SURGERY Bilateral    cataract surgery   FINGER ARTHROPLASTY Left    left thumb-Dr. Amedeo Plenty   INGUINAL HERNIA REPAIR Left 07/03/2021   Procedure: LEFT INGUINAL HERNIA REPAIR WITH MESH;  Surgeon: Jovita Kussmaul, MD;  Location: [place];  Service: General;  Laterality: Left;   INSERTION OF MESH N/A 07/03/2021   Procedure: INSERTION OF MESH X2;  Surgeon: Jovita Kussmaul, MD;  Location: [place];  Service: General;  Laterality: N/A;   IR IMAGING GUIDED PORT INSERTION  03/12/2022   IR RADIOLOGIST EVAL & MGMT  10/25/2019   KNEE ARTHROSCOPY Left    KNEE SURGERY Left    Bakers cyst x2   SHOULDER ARTHROSCOPY Right    thumb surgery     TONSILLECTOMY     TOTAL KNEE ARTHROPLASTY Left 03/19/2016   Procedure: LEFT TOTAL KNEE ARTHROPLASTY;  Surgeon: Sydnee Cabal, MD;  Location: [facility];  Service: Orthopedics;  Laterality: Left;   TOTAL SHOULDER REPLACEMENT Right    UMBILICAL HERNIA REPAIR N/A 07/03/2021   Procedure: UMBILICAL HERNIA REPAIR WITH MESH;  Surgeon: Jovita Kussmaul, MD;  Location: [place];  Service: General;  Laterality: N/A;   VASCULAR SURGERY     VASECTOMY     VIDEO BRONCHOSCOPY WITH ENDOBRONCHIAL ULTRASOUND Right 02/23/2022   Procedure: VIDEO BRONCHOSCOPY WITH ENDOBRONCHIAL ULTRASOUND;  Surgeon: Collene Gobble, MD;  Location[facility] ENDOSCOPY;  Service: Pulmonary;  Laterality: Right;   VIDEO BRONCHOSCOPY WITH RADIAL ENDOBRONCHIAL ULTRASOUND  02/23/2022    Procedure: VIDEO BRONCHOSCOPY WITH RADIAL ENDOBRONCHIAL ULTRASOUND;  Surgeon: Collene Gobble, MD;  Location: [place] ENDOSCOPY;  Service: Pulmonary;;   Social History:  reports that he quit smoking about 3 years ago. His smoking use included cigarettes. He has never used smokeless tobacco. He reports that he does not currently use alcohol. He reports that he does not use drugs.  Allergies  Allergen Reactions   Losartan Anaphylaxis   Atorvastatin Calcium     Causes agitation at 40 mg, tolerates 20 mg   Pollen Extract-Tree Extract [Pollen Extract]     Family History  Problem Relation Age of Onset   Ovarian cancer Mother    Breast cancer Mother    Macular degeneration Mother    Heart attack Father     Prior to Admission medications   Medication Sig Start Date End Date Taking? Authorizing Provider  acetaminophen (TYLENOL) 500 MG tablet Take 500 mg by mouth daily as needed for moderate pain or headache.    [provider]  Alum Hydroxide-Mag Trisilicate (GAVISCON) 41-66.0 MG CHEW Chew 1 tablet by mouth daily as needed (heartburn).    [provider]  apixaban (ELIQUIS) 5 MG TABS tablet Take 1 tablet (5 mg total) by mouth 2 (two) times daily. Okay to restart this medication on 02/24/2022 02/23/22   Collene Gobble, MD  atorvastatin (LIPITOR) 20 MG tablet Take 20 mg by mouth at bedtime. 10/18/19   [provider]  clonazePAM (KLONOPIN) 1 MG tablet Take 1 tablet (1 mg total) by mouth 2 (two) times daily as needed for anxiety. 07/17/15   Sinda Du, MD  fluticasone (FLONASE) 50 MCG/ACT nasal spray Place 1 spray into both nostrils 2 (two) times daily as needed for allergies.    [provider]  lidocaine-prilocaine (EMLA) cream Apply to the Port-A-Cath site 30-60 minutes before chemotherapy 03/04/22   Curt Bears, MD  meloxicam (MOBIC) 15 MG tablet Take 15 mg by mouth daily as needed for pain.    [provider]  Multiple Vitamin (MULTIVITAMIN WITH  MINERALS) TABS tablet Take 1 tablet by mouth daily. 07/17/15   Sinda Du, MD  Naphazoline HCl (CLEAR EYES OP) Place 1 drop into both eyes daily.    [provider]  OVER THE COUNTER MEDICATION Take 1-3 capsules by mouth at bedtime. Oxy-Powder colon cleanse otc supplement/ Unknown strenght    [provider]  PROAIR HFA 108 (90 BASE) MCG/ACT inhaler Inhale 2 puffs into the lungs every 6 (six) hours as needed for wheezing or shortness of breath.  04/11/15   [provider]  prochlorperazine (COMPAZINE) 10 MG tablet Take 1 tablet (10 mg total) by mouth every 6 (six) hours as needed for nausea or vomiting. 03/04/22   Curt Bears, MD  sildenafil (REVATIO) 20 MG tablet Take 60 mg by mouth daily as needed (ED). 01/18/14   [provider]  Tiotropium Bromide-Olodaterol (STIOLTO RESPIMAT) 2.5-2.5 MCG/ACT AERS Inhale 2  puffs into the lungs daily. 04/06/22   Collene Gobble, MD  triamcinolone cream (KENALOG) 0.1 % Apply 1 Application topically daily. 02/07/22   [provider]    Physical Exam: Vitals:   05/27/22 1009  BP: 128/68  Pulse: 76  Resp: 18  Temp: 97.8 F (36.6 C)  TempSrc: Oral  SpO2: 98%  Weight: 70.3 kg  Height: 5\' 8"  (1.727 m)   General: 76 y.o. male resting in bed in NAD Eyes: PERRL, normal sclera ENMT: Nares patent w/o discharge, orophaynx clear, dentition normal, ears w/o discharge/lesions/ulcers Neck: Supple, trachea midline Cardiovascular: RRR, +S1, S2, no m/g/r, equal pulses throughout Respiratory: CTABL, no w/r/r, normal WOB GI: BS+, NDNT, no masses noted, no organomegaly noted MSK: No e/c/c Neuro: A&O x 3, no focal deficits Psyc: Appropriate interaction and affect, calm/cooperative  Data Reviewed:  Results for orders placed or performed during the hospital encounter of 05/27/22 (from the past 24 hour(s))  CBG monitoring, ED     Status: Abnormal   Collection Time: 05/27/22 10:11 AM  Result Value Ref Range    Glucose-Capillary 480 (H) 70 - 99 mg/dL  Beta-hydroxybutyric acid     Status: Abnormal   Collection Time: 05/27/22 10:37 AM  Result Value Ref Range   Beta-Hydroxybutyric Acid 6.29 (H) 0.05 - 0.27 mmol/L  Urinalysis, Routine w reflex microscopic Urine, Clean Catch     Status: Abnormal   Collection Time: 05/27/22 10:37 AM  Result Value Ref Range   Color, Urine STRAW (A) YELLOW   APPearance CLEAR CLEAR   Specific Gravity, Urine 1.023 1.005 - 1.030   pH 5.0 5.0 - 8.0   Glucose, UA >=500 (A) NEGATIVE mg/dL   Hgb urine dipstick NEGATIVE NEGATIVE   Bilirubin Urine NEGATIVE NEGATIVE   Ketones, ur 80 (A) NEGATIVE mg/dL   Protein, ur NEGATIVE NEGATIVE mg/dL   Nitrite NEGATIVE NEGATIVE   Leukocytes,Ua NEGATIVE NEGATIVE   RBC / HPF 0-5 0 - 5 RBC/hpf   WBC, UA 0-5 0 - 5 WBC/hpf   Bacteria, UA RARE (A) NONE SEEN   Mucus PRESENT   Blood gas, venous     Status: Abnormal   Collection Time: 05/27/22 10:37 AM  Result Value Ref Range   pH, Ven 7.32 7.25 - 7.43   pCO2, Ven 37 (L) 44 - 60 mmHg   pO2, Ven 38 32 - 45 mmHg   Bicarbonate 19.1 (L) 20.0 - 28.0 mmol/L   Acid-base deficit 6.4 (H) 0.0 - 2.0 mmol/L   O2 Saturation 67 %   Patient temperature 97.0   Basic metabolic panel     Status: Abnormal   Collection Time: 05/27/22 11:52 AM  Result Value Ref Range   Sodium 135 135 - 145 mmol/L   Potassium 4.6 3.5 - 5.1 mmol/L   Chloride 105 98 - 111 mmol/L   CO2 19 (L) 22 - 32 mmol/L   Glucose, Bld 453 (H) 70 - 99 mg/dL   BUN 23 8 - 23 mg/dL   Creatinine, Ser 0.89 0.61 - 1.24 mg/dL   Calcium 9.2 8.9 - 10.3 mg/dL   GFR, Estimated >60 >60 mL/min   Anion gap 11 5 - 15  Troponin I (High Sensitivity)     Status: None   Collection Time: 05/27/22 11:52 AM  Result Value Ref Range   Troponin I (High Sensitivity) 16 <18 ng/L  CBC     Status: Abnormal   Collection Time: 05/27/22 11:52 AM  Result Value Ref Range   WBC 4.8 4.0 -  10.5 K/uL   RBC 3.22 (L) 4.22 - 5.81 MIL/uL   Hemoglobin 10.3 (L) 13.0 -  17.0 g/dL   HCT 30.4 (L) 39.0 - 52.0 %   MCV 94.4 80.0 - 100.0 fL   MCH 32.0 26.0 - 34.0 pg   MCHC 33.9 30.0 - 36.0 g/dL   RDW 14.2 11.5 - 15.5 %   Platelets 91 (L) 150 - 400 K/uL   nRBC 0.0 0.0 - 0.2 %  Differential     Status: None   Collection Time: 05/27/22 11:52 AM  Result Value Ref Range   Neutrophils Relative % 61 %   Neutro Abs 3.0 1.7 - 7.7 K/uL   Lymphocytes Relative 20 %   Lymphs Abs 0.9 0.7 - 4.0 K/uL   Monocytes Relative 16 %   Monocytes Absolute 0.8 0.1 - 1.0 K/uL   Eosinophils Relative 1 %   Eosinophils Absolute 0.1 0.0 - 0.5 K/uL   Basophils Relative 1 %   Basophils Absolute 0.0 0.0 - 0.1 K/uL   Immature Granulocytes 1 %   Abs Immature Granulocytes 0.03 0.00 - 0.07 K/uL  CBG monitoring, ED     Status: Abnormal   Collection Time: 05/27/22  1:23 PM  Result Value Ref Range   Glucose-Capillary 359 (H) 70 - 99 mg/dL   CXR: 1. Right chest port in place with tip at the cavoatrial junction. No pneumothorax. 2. Left basilar atelectasis. Known lung nodules at the right lung base are not definitively visualized on this exam. Recommend CT chest for more definitive characterizatio, if clinically indicated.  Assessment and Plan: DKA DM2     - admit to inpt, SDU     - no DM diagnosis     - per EDP conversation w/ onco, his chemo could be the culprit     - check A1c     - continue endotool     - DM coordinator consult     - NPO except for non-caloric fluids, meds  Small cell lung cancer     - follows w/ Dr. Julien Nordmann (added to team)     - he was supposed to get imaging done outpt today; EDP ordered that here     - continue outpt follow up  Dysphagia     - SLP eval  Normocytic anemia Thrombocytopenia      - likely secondary to chemo; no evidence of bleed, trend for now  HLD     - continue home regimen when confirmed  COPD     - continue home regimen when confirmed  HTN     - continue home regimen when confirmed  Paroxysmal atrial flutter     - continue  home regimen when confirmed  GERD     - PPI  Advance Care Planning:   Code Status: FULL  Consults: None  Family Communication: None at bedside  Severity of Illness: The appropriate patient status for this patient is INPATIENT. Inpatient status is judged to be reasonable and necessary in order to provide the required intensity of service to ensure the patient's safety. The patient's presenting symptoms, physical exam findings, and initial radiographic and laboratory data in the context of their chronic comorbidities is felt to place them at high risk for further clinical deterioration. Furthermore, it is not anticipated that the patient will be medically stable for discharge from the hospital within 2 midnights of admission.   * I certify that at the point of admission it is my clinical judgment that the  patient will require inpatient hospital care spanning beyond 2 midnights from the point of admission due to high intensity of service, high risk for further deterioration and high frequency of surveillance required.*  Author: Jonnie Finner, DO 05/27/2022 1:28 PM  For on call review [email or web address].

## 2022-05-27 NOTE — Telephone Encounter (Signed)
Ketonuria-Per Dr. Julien Nordmann , I instructed his wife to take pt to ED for ketonuria ( UA yesterday). She said she will take him now.

## 2022-05-27 NOTE — ED Provider Notes (Signed)
[facility] DEPT Provider Note   CSN: [id number] Arrival date & time: 05/27/22  5956     History  Chief Complaint  Patient presents with   Hyperglycemia   Weakness    Adam Jordan is a 76 y.o. male.  Pt is a 76 yo male with a pmhx significant for small cell lung cancer with bony mets, copd, anxiety, hld, paroxysmal a. Flutter (on Eliquis), and hx of thoracoabdominal aneurysm (s/p FEVAR).  Pt said he's been feeling very weak and thirsty for the past few days.  He did go to the cancer center yesterday and had blood work done.  Pt had ketones in his urine and an elevated bs.  Pt advised to come to the ED for further eval.  Pt said he feels mentally slow.  Pt gets weak when he stands up.   He denies f/c or cough.        Home Medications Prior to Admission medications   Medication Sig Start Date End Date Taking? Authorizing Provider  acetaminophen (TYLENOL) 500 MG tablet Take 500 mg by mouth daily as needed for moderate pain or headache.    [provider]  Alum Hydroxide-Mag Trisilicate (GAVISCON) 38-75.6 MG CHEW Chew 1 tablet by mouth daily as needed (heartburn).    [provider]  apixaban (ELIQUIS) 5 MG TABS tablet Take 1 tablet (5 mg total) by mouth 2 (two) times daily. Okay to restart this medication on 02/24/2022 02/23/22   Collene Gobble, MD  atorvastatin (LIPITOR) 20 MG tablet Take 20 mg by mouth at bedtime. 10/18/19   [provider]  clonazePAM (KLONOPIN) 1 MG tablet Take 1 tablet (1 mg total) by mouth 2 (two) times daily as needed for anxiety. 07/17/15   Sinda Du, MD  fluticasone (FLONASE) 50 MCG/ACT nasal spray Place 1 spray into both nostrils 2 (two) times daily as needed for allergies.    [provider]  lidocaine-prilocaine (EMLA) cream Apply to the Port-A-Cath site 30-60 minutes before chemotherapy 03/04/22   Curt Bears, MD  meloxicam (MOBIC) 15 MG tablet Take 15 mg by mouth daily as needed  for pain.    [provider]  Multiple Vitamin (MULTIVITAMIN WITH MINERALS) TABS tablet Take 1 tablet by mouth daily. 07/17/15   Sinda Du, MD  Naphazoline HCl (CLEAR EYES OP) Place 1 drop into both eyes daily.    [provider]  OVER THE COUNTER MEDICATION Take 1-3 capsules by mouth at bedtime. Oxy-Powder colon cleanse otc supplement/ Unknown strenght    [provider]  PROAIR HFA 108 (90 BASE) MCG/ACT inhaler Inhale 2 puffs into the lungs every 6 (six) hours as needed for wheezing or shortness of breath.  04/11/15   [provider]  prochlorperazine (COMPAZINE) 10 MG tablet Take 1 tablet (10 mg total) by mouth every 6 (six) hours as needed for nausea or vomiting. 03/04/22   Curt Bears, MD  sildenafil (REVATIO) 20 MG tablet Take 60 mg by mouth daily as needed (ED). 01/18/14   [provider]  Tiotropium Bromide-Olodaterol (STIOLTO RESPIMAT) 2.5-2.5 MCG/ACT AERS Inhale 2 puffs into the lungs daily. 04/06/22   Collene Gobble, MD  triamcinolone cream (KENALOG) 0.1 % Apply 1 Application topically daily. 02/07/22   [provider]      Allergies    Losartan, Atorvastatin calcium, and Pollen extract-tree extract [pollen extract]    Review of Systems   Review of Systems  Endocrine: Positive for polydipsia and polyuria.  Neurological:  Positive for weakness.  All other systems reviewed and are negative.   Physical Exam Updated Vital Signs BP 128/68 (BP Location: Right Arm)   Pulse 76   Temp 97.8 F (36.6 C) (Oral)   Resp 18   Ht 5\' 8"  (1.727 m)   Wt 70.3 kg   SpO2 98%   BMI 23.57 kg/m  Physical Exam Vitals and nursing note reviewed.  Constitutional:      Appearance: Normal appearance.  HENT:     Head: Normocephalic and atraumatic.     Right Ear: External ear normal.     Left Ear: External ear normal.     Nose: Nose normal.     Mouth/Throat:     Mouth: Mucous membranes are dry.  Eyes:     Extraocular Movements:  Extraocular movements intact.     Conjunctiva/sclera: Conjunctivae normal.     Pupils: Pupils are equal, round, and reactive to light.  Cardiovascular:     Rate and Rhythm: Normal rate and regular rhythm.     Pulses: Normal pulses.     Heart sounds: Normal heart sounds.  Pulmonary:     Effort: Pulmonary effort is normal.     Breath sounds: Normal breath sounds.  Abdominal:     General: Abdomen is flat. Bowel sounds are normal.     Palpations: Abdomen is soft.  Musculoskeletal:        General: Normal range of motion.     Cervical back: Normal range of motion and neck supple.  Skin:    General: Skin is warm.     Capillary Refill: Capillary refill takes less than 2 seconds.  Neurological:     General: No focal deficit present.     Mental Status: He is alert and oriented to person, place, and time.  Psychiatric:        Mood and Affect: Mood normal.        Behavior: Behavior normal.     ED Results / Procedures / Treatments   Labs (all labs ordered are listed, but only abnormal results are displayed) Labs Reviewed  BETA-HYDROXYBUTYRIC ACID - Abnormal; Notable for the following components:      Result Value   Beta-Hydroxybutyric Acid 6.29 (*)    All other components within normal limits  BLOOD GAS, VENOUS - Abnormal; Notable for the following components:   pCO2, Ven 37 (*)    Bicarbonate 19.1 (*)    Acid-base deficit 6.4 (*)    All other components within normal limits  BASIC METABOLIC PANEL - Abnormal; Notable for the following components:   CO2 19 (*)    Glucose, Bld 453 (*)    All other components within normal limits  CBC - Abnormal; Notable for the following components:   RBC 3.22 (*)    Hemoglobin 10.3 (*)    HCT 30.4 (*)    Platelets 91 (*)    All other components within normal limits  CBG MONITORING, ED - Abnormal; Notable for the following components:   Glucose-Capillary 480 (*)    All other components within normal limits  DIFFERENTIAL  BASIC METABOLIC PANEL   BASIC METABOLIC PANEL  BASIC METABOLIC PANEL  BETA-HYDROXYBUTYRIC ACID  CBC WITH DIFFERENTIAL/PLATELET  URINALYSIS, ROUTINE W REFLEX MICROSCOPIC  TROPONIN I (HIGH SENSITIVITY)  TROPONIN I (HIGH SENSITIVITY)    EKG EKG Interpretation  Date/Time:  Thursday May 27 2022 10:50:08 EDT Ventricular Rate:  67 PR Interval:  151 QRS Duration: 141 QT Interval:  482 QTC Calculation: 509 R Axis:  81 Text Interpretation: Sinus rhythm Right bundle branch block Lateral infarct, acute No significant change since last tracing Confirmed by Isla Pence [phone]) on 05/27/2022 12:41:36 PM  Radiology DG Chest Portable 1 View  Result Date: 05/27/2022 CLINICAL DATA:  DKA EXAM: PORTABLE CHEST 1 VIEW COMPARISON:  Chest radiograph 02/23/2022 FINDINGS: Needle accessed right chest port in place with tip at the cavoatrial junction. Aortic vascular stent in place. No pleural effusion. No pneumothorax. Unchanged cardiac and mediastinal contours. Left basilar atelectasis. Previously seen nodules in the right lung base are not definitively visualized on this exam. No displaced rib fractures. IMPRESSION: 1. Right chest port in place with tip at the cavoatrial junction. No pneumothorax. 2. Left basilar atelectasis. Known lung nodules at the right lung base are not definitively visualized on this exam. Recommend CT chest for more definitive characterizatio, if clinically indicated. Electronically Signed   By: Marin Roberts M.D.   On: 05/27/2022 11:27    Procedures Procedures    Medications Ordered in ED Medications  lactated ringers infusion ( Intravenous New Bag/Given 05/27/22 1050)  dextrose 5 % in lactated ringers infusion (has no administration in time range)  dextrose 50 % solution 0-50 mL (has no administration in time range)  insulin regular, human (MYXREDLIN) 100 units/ 100 mL infusion (10.5 Units/hr Intravenous New Bag/Given 05/27/22 1246)  iohexol (OMNIPAQUE) 9 MG/ML oral solution (has no  administration in time range)  lactated ringers bolus 1,406 mL (1,406 mLs Intravenous New Bag/Given 05/27/22 1050)  iohexol (OMNIPAQUE) 9 MG/ML oral solution 500 mL (500 mLs Oral Contrast Given 05/27/22 1355)    ED Course/ Medical Decision Making/ A&P                           Medical Decision Making Amount and/or Complexity of Data Reviewed Labs: ordered. Radiology: ordered.  Risk Prescription drug management. Decision regarding hospitalization.   This patient presents to the ED for concern of weakness, this involves an extensive number of treatment options, and is a complaint that carries with it a high risk of complications and morbidity.  The differential diagnosis includes dka, hhs, infection   Co morbidities that complicate the patient evaluation  small cell lung cancer with bony mets, copd, anxiety, hld, paroxysmal a. Flutter (on Eliquis)   Additional history obtained:  Additional history obtained from epic chart review External records from outside source obtained and reviewed including wife   Lab Tests:  I Ordered, and personally interpreted labs.  The pertinent results include:  vbg with ph 7.32, pco2 37; bhb 6.29, bmp with co 19 and glucose elevated at 453   Imaging Studies ordered:  I ordered imaging studies including cxr  I independently visualized and interpreted imaging which showed  IMPRESSION:  1. Right chest port in place with tip at the cavoatrial junction. No  pneumothorax.  2. Left basilar atelectasis. Known lung nodules at the right lung  base are not definitively visualized on this exam. Recommend CT  chest for more definitive characterizatio, if clinically indicated.   I agree with the radiologist interpretation   Cardiac Monitoring:  The patient was maintained on a cardiac monitor.  I personally viewed and interpreted the cardiac monitored which showed an underlying rhythm of: nsr   Medicines ordered and prescription drug  management:  I ordered medication including ivfs and insulin drip  for dka  Reevaluation of the patient after these medicines showed that the patient improved I have reviewed the patients home  medicines and have made adjustments as needed  Critical Interventions:  Iv insulin/ivfs   Consultations Obtained:  I requested consultation with the hospitalist (Dr. Marylyn Ishihara),  and discussed lab and imaging findings as well as pertinent plan -he will admit   Problem List / ED Course:  DKA:  new onset.  Unclear why.  Possibly due to chemo.     Reevaluation:  After the interventions noted above, I reevaluated the patient and found that they have :improved   Social Determinants of Health:  Lives at home   Dispostion:  After consideration of the diagnostic results and the patients response to treatment, I feel that the patent would benefit from admission.    CRITICAL CARE Performed by: Isla Pence   Total critical care time: 30 minutes  Critical care time was exclusive of separately billable procedures and treating other patients.  Critical care was necessary to treat or prevent imminent or life-threatening deterioration.  Critical care was time spent personally by me on the following activities: development of treatment plan with patient and/or surrogate as well as nursing, discussions with consultants, evaluation of patient's response to treatment, examination of patient, obtaining history from patient or surrogate, ordering and performing treatments and interventions, ordering and review of laboratory studies, ordering and review of radiographic studies, pulse oximetry and re-evaluation of patient's condition.         Final Clinical Impression(s) / ED Diagnoses Final diagnoses:  Diabetic ketoacidosis without coma associated with type 1 diabetes mellitus ([place])    Rx / DC Orders ED Discharge Orders     None         Isla Pence, MD 05/27/22 1257

## 2022-05-27 NOTE — Inpatient Diabetes Management (Signed)
Inpatient Diabetes Program Recommendations  AACE/ADA: New Consensus Statement on Inpatient Glycemic Control (2015)  Target Ranges:  Prepandial:   less than 140 mg/dL      Peak postprandial:   less than 180 mg/dL (1-2 hours)      Critically ill patients:  140 - 180 mg/dL   Lab Results  Component Value Date   GLUCAP 359 (H) 05/27/2022    Review of Glycemic Control  Latest Reference Range & Units 05/27/22 10:11 05/27/22 13:23  Glucose-Capillary 70 - 99 mg/dL 480 (H) 359 (H)  (H): Data is abnormally high Diabetes history: New onset DM Outpatient Diabetes medications: none Current orders for Inpatient glycemic control: IV insulin  Inpatient Diabetes Program Recommendations:    Consider adding on A1C.  Spoke with patient regarding new onset DM. Admits to recent fatigue, polyuria and polydipsia.  Explained what a A1c is and what it measures. Also reviewed goal A1c with patient, importance of good glucose control @ home, and blood sugar goals. Reviewed patho of DM, need for insulin, role of pancreas, survival skills, interventions, impact while on certain chemotherapy agents, vascular changes and commorbidities.  Patient will need a meter at discharge. Meter kit [id number]. Reviewed frequency of checking CBGs and when to call Md. Encouraged learning how to self inject and check CBGs. Will order [facility] booklet, dietitian consult, and insulin starter kit.  Admits to drinking occasional sugary beverages, but only recently due to polydipsia. Reviewed alternatives, plate method and importance of being mindful of CHO.  Educated patient on insulin pen use at home. Reviewed contents of insulin flexpen starter kit. Reviewed all steps if insulin pen including attachment of needle, 2-unit air shot, dialing up dose, giving injection, removing needle, disposal of sharps, storage of unused insulin, disposal of insulin etc. Patient able to provide successful return demonstration. Also reviewed troubleshooting  with insulin pen. MD to give patient Rxs for insulin pens and insulin pen needles.    Thanks, Bronson Curb, MSN, RNC-OB Diabetes Coordinator [phone] (8a-5p)

## 2022-05-27 NOTE — Telephone Encounter (Signed)
Spoke with Belenda Cruise and advise to schedule appointment as I spoke with Diane at Dr. Earlie Server office and advised of appointment.

## 2022-05-28 ENCOUNTER — Ambulatory Visit (HOSPITAL_COMMUNITY): Payer: Medicare HMO

## 2022-05-28 DIAGNOSIS — E1110 Type 2 diabetes mellitus with ketoacidosis without coma: Secondary | ICD-10-CM | POA: Diagnosis not present

## 2022-05-28 LAB — BASIC METABOLIC PANEL
Anion gap: 8 (ref 5–15)
BUN: 15 mg/dL (ref 8–23)
CO2: 23 mmol/L (ref 22–32)
Calcium: 8.7 mg/dL — ABNORMAL LOW (ref 8.9–10.3)
Chloride: 104 mmol/L (ref 98–111)
Creatinine, Ser: 0.6 mg/dL — ABNORMAL LOW (ref 0.61–1.24)
GFR, Estimated: >60 mL/min (ref >60)
Glucose, Bld: 155 mg/dL — ABNORMAL HIGH (ref 70–99)
Potassium: 3.3 mmol/L — ABNORMAL LOW (ref 3.5–5.1)
Sodium: 135 mmol/L (ref 135–145)

## 2022-05-28 LAB — GLUCOSE, CAPILLARY
Glucose-Capillary: 111 mg/dL — ABNORMAL HIGH (ref 70–99)
Glucose-Capillary: 169 mg/dL — ABNORMAL HIGH (ref 70–99)
Glucose-Capillary: 188 mg/dL — ABNORMAL HIGH (ref 70–99)
Glucose-Capillary: 296 mg/dL — ABNORMAL HIGH (ref 70–99)

## 2022-05-28 LAB — BETA-HYDROXYBUTYRIC ACID: Beta-Hydroxybutyric Acid: 2.3 mmol/L — ABNORMAL HIGH (ref 0.05–0.27)

## 2022-05-28 MED ORDER — MELOXICAM 15 MG PO TABS
15.0000 mg | ORAL_TABLET | Freq: Every day | ORAL | Status: DC | PRN
  Filled 2022-05-28: qty 1

## 2022-05-28 MED ORDER — POTASSIUM CHLORIDE CRYS ER 20 MEQ PO TBCR
40.0000 meq | EXTENDED_RELEASE_TABLET | Freq: Two times a day (BID) | ORAL | Status: AC
  Administered 2022-05-28 – 2022-05-29 (×3): 40 meq via ORAL
  Filled 2022-05-28 (×3): qty 2

## 2022-05-28 MED ORDER — SODIUM CHLORIDE 0.9 % IV SOLN: INTRAVENOUS | Status: AC

## 2022-05-28 MED ORDER — SODIUM CHLORIDE 0.9% FLUSH
10.0000 mL | INTRAVENOUS | Status: DC | PRN
  Administered 2022-05-29 (×2): 10 mL

## 2022-05-28 MED ORDER — INSULIN ASPART 100 UNIT/ML IJ SOLN
0.0000 [IU] | Freq: Three times a day (TID) | INTRAMUSCULAR | Status: DC
  Administered 2022-05-28 – 2022-05-29 (×2): 11 [IU] via SUBCUTANEOUS
  Administered 2022-05-29: 3 [IU] via SUBCUTANEOUS

## 2022-05-28 MED ORDER — INSULIN GLARGINE-YFGN 100 UNIT/ML ~~LOC~~ SOLN
12.0000 [IU] | Freq: Every day | SUBCUTANEOUS | Status: DC
  Administered 2022-05-28: 12 [IU] via SUBCUTANEOUS
  Filled 2022-05-28 (×2): qty 0.12

## 2022-05-28 NOTE — Progress Notes (Signed)
Patient was able to self administer his insulin shot with instructions supervised by RN.

## 2022-05-28 NOTE — Plan of Care (Signed)
  RD consulted for nutrition education regarding diabetes.   Lab Results  Component Value Date   HGBA1C 10.1 (H) 05/27/2022    RD provided "Carbohydrate Counting for People with Diabetes" handout from the Nutrition Care Manual and the "Plate Planner for People with Diabetes" handout. Discussed different food groups and their effects on blood sugar, emphasizing carbohydrate-containing foods. Provided list of carbohydrates and recommended serving sizes of common foods. Reviewed meal planning and the appropriate amount of CHO/meal.  Discussed importance of controlled and consistent carbohydrate intake throughout the day. Provided examples of ways to balance meals/snacks and encouraged intake of high-fiber, whole grain complex carbohydrates. Discouraged concentrated sweets such as cookies, sodas, candies, cakes, juices, etc. Pt reports he doesn't eat these foods often. Diet recall is well balanced. Discussed small tweaks to usual diet such as choosing whole grain instead of refined grains or adding a vegetable to a meal. Teach back method used. Pt asked appropriate questions and demonstrates understanding.  Expect good compliance.  Body mass index is 23.57 kg/m. Pt meets criteria for healthy weight based on current BMI.  Current diet order is carb modified, patient is consuming approximately 100% of meals at this time. Labs and medications reviewed. No further nutrition interventions warranted at this time. RD contact information provided. If additional nutrition issues arise, please re-consult RD.  Candise Bowens, MS, RD, LDN, CNSC See AMiON for contact information

## 2022-05-28 NOTE — Progress Notes (Signed)
Adam Jordan  BTD:[id number] DOB: 10/29/1945 DOA: 05/27/2022 PCP: [name], DO    Brief Narrative:  76 year old with a history of small cell carcinoma of the lung, COPD, HLD, atrial flutter, HTN, and thoracoabdominal aortic aneurysm who is currently active on chemotherapy and presented to the ER with complaints of severe fatigue worsening since his last treatment 2 weeks prior.  This has been accompanied by sensation of difficulty swallowing solid foods with limited oral intake.  He was seen in the oncology clinic where labs were "concerning for DKA" and he was sent to the ER for evaluation.  Goals of Care:  Code Status: Full Code   DVT prophylaxis: Eliquis  Interim Hx: Afebrile since admission.  CBG now well controlled and patient off insulin drip.  Vital signs otherwise stable.  States that he feels dramatically better.  Is alert conversant and oriented.  Denies chest pain or shortness of breath.  Is motivated to learn about diabetes and its management.  Assessment & Plan:  Newly diagnosed severely uncontrolled DM2 - HHS v/s early DKA Potentially related to his chemotherapy - CBG now controlled after use of insulin infusion -successfully transitioned off insulin infusion - monitor CBGs closely with resumption of oral intake - A1c 10.1 - educate on use of insulin and home CBG monitoring   Small cell lung cancer Followed by Dr. Earlie Server -no acute issues requiring inpatient consultation  Dysphagia Symptoms have resolved -no findings on SLP eval -no further work-up indicated  Hypokalemia A consequence of treatment of hyperglycemia -supplement and follow  Normocytic anemia and thrombocytopenia Felt to be a consequence of his chemotherapy -stable  HLD Continue usual home medical therapy  COPD Continue usual home medical therapy -well compensated at present  Chronic paroxysmal atrial flutter Continue usual home medical therapies -rate well controlled at  present  Disposition: Anticipate discharge home, likely 10/28 if CBG controlled and DM education completed  Objective: Blood pressure (!) 126/40, pulse (!) 58, temperature 97.6 F (36.4 C), temperature source Oral, resp. rate (!) 24, height 5\' 8"  (1.727 m), weight 70.3 kg, SpO2 91 %.  Intake/Output Summary (Last 24 hours) at 05/28/2022 0819 Last data filed at 05/28/2022 0500 Gross per 24 hour  Intake 1961 ml  Output --  Net 1961 ml   Filed Weights   05/27/22 1009  Weight: 70.3 kg    Examination: General: No acute respiratory distress Lungs: Clear to auscultation bilaterally without wheezes or crackles Cardiovascular: Regular rate and rhythm without murmur gallop or rub normal S1 and S2 Abdomen: Nontender, nondistended, soft, bowel sounds positive, no rebound, no ascites, no appreciable mass Extremities: No significant cyanosis, clubbing, or edema bilateral lower extremities  CBC: Recent Labs  Lab 05/26/22 1326 05/27/22 1152  WBC 6.0 4.8  NEUTROABS 4.2 3.0  HGB 11.8* 10.3*  HCT 34.3* 30.4*  MCV 92.0 94.4  PLT 101* 91*   Basic Metabolic Panel: Recent Labs  Lab 05/27/22 1152 05/27/22 1532 05/28/22 0249  NA 135 135 135  K 4.6 3.8 3.3*  CL 105 106 104  CO2 19* 24 23  GLUCOSE 453* 134* 155*  BUN 23 18 15   CREATININE 0.89 0.71 0.60*  CALCIUM 9.2 8.9 8.7*   GFR: Estimated Creatinine Clearance: 78.4 mL/min (A) (by C-G formula based on SCr of 0.6 mg/dL (L)).   Scheduled Meds:  apixaban  5 mg Oral BID   atorvastatin  20 mg Oral QHS   Chlorhexidine Gluconate Cloth  6 each Topical Daily   insulin aspart  0-5 Units Subcutaneous QHS   insulin aspart  0-9 Units Subcutaneous TID WC   insulin glargine-yfgn  5 Units Subcutaneous Daily   traZODone  50 mg Oral QHS   Continuous Infusions:  lactated ringers 100 mL/hr at 05/28/22 0500     LOS: 1 day   Cherene Altes, MD [facility] Office  [phone] Pager - Text Page per Shea Evans  If 7PM-7AM, please  contact night-coverage per Amion 05/28/2022, 8:19 AM

## 2022-05-28 NOTE — Evaluation (Signed)
Clinical/Bedside Swallow Evaluation Patient Details  Name: Adam Jordan MRN: [id number] Date of Birth: [birth year]  Today's Date: 05/28/2022 Time: SLP Start Time (ACUTE ONLY): 0907 SLP Stop Time (ACUTE ONLY): 0920 SLP Time Calculation (min) (ACUTE ONLY): 13 min  Past Medical History:  Past Medical History:  Diagnosis Date   Anxiety    Arthritis    osteoarthrititis- knees and most joints.   COPD (chronic obstructive pulmonary disease) (HCC)    moderate -no regular use of inhalers- rare use of oxygen as sexual activity   Dyspnea    outside in hot weather and also with pollen   Hyperlipidemia    Hypertension    Neuromuscular disorder (HCC)    feet   Paroxysmal atrial flutter ([place]) 01/15/2021   Thoracoabdominal aneurysm ([place])    s/p FEVAR 4 Vessel TABME 02/19/20 Dr. Katy Apo   Past Surgical History:  Past Surgical History:  Procedure Laterality Date   ABDOMINAL AORTIC ANEURYSM REPAIR  02/06/2020   BRONCHIAL BIOPSY  02/23/2022   Procedure: BRONCHIAL BIOPSIES;  Surgeon: Collene Gobble, MD;  Location: [facility] ENDOSCOPY;  Service: Pulmonary;;   BRONCHIAL BRUSHINGS  02/23/2022   Procedure: BRONCHIAL BRUSHINGS;  Surgeon: Collene Gobble, MD;  Location: [facility] ENDOSCOPY;  Service: Pulmonary;;   BRONCHIAL NEEDLE ASPIRATION BIOPSY  02/23/2022   Procedure: BRONCHIAL NEEDLE ASPIRATION BIOPSIES;  Surgeon: Collene Gobble, MD;  Location: [facility] ENDOSCOPY;  Service: Pulmonary;;   BROW LIFT Bilateral 12/11/2021   Procedure: UPPER LID BLEPHAROPLASTY;  Surgeon: Irene Limbo, MD;  Location: [place];  Service: Plastics;  Laterality: Bilateral;   CYST REMOVAL LEG Left 07/03/2021   Procedure: EXCISION CYST LEFT BUTTOCK;  Surgeon: Jovita Kussmaul, MD;  Location: [place];  Service: General;  Laterality: Left;   EYE SURGERY Bilateral    cataract surgery   FINGER ARTHROPLASTY Left    left thumb-Dr. Amedeo Plenty   INGUINAL HERNIA REPAIR Left 07/03/2021   Procedure: LEFT INGUINAL HERNIA REPAIR WITH  MESH;  Surgeon: Jovita Kussmaul, MD;  Location: [place];  Service: General;  Laterality: Left;   INSERTION OF MESH N/A 07/03/2021   Procedure: INSERTION OF MESH X2;  Surgeon: Jovita Kussmaul, MD;  Location: [place];  Service: General;  Laterality: N/A;   IR IMAGING GUIDED PORT INSERTION  03/12/2022   IR RADIOLOGIST EVAL & MGMT  10/25/2019   KNEE ARTHROSCOPY Left    KNEE SURGERY Left    Bakers cyst x2   SHOULDER ARTHROSCOPY Right    thumb surgery     TONSILLECTOMY     TOTAL KNEE ARTHROPLASTY Left 03/19/2016   Procedure: LEFT TOTAL KNEE ARTHROPLASTY;  Surgeon: Sydnee Cabal, MD;  Location: [facility];  Service: Orthopedics;  Laterality: Left;   TOTAL SHOULDER REPLACEMENT Right    UMBILICAL HERNIA REPAIR N/A 07/03/2021   Procedure: UMBILICAL HERNIA REPAIR WITH MESH;  Surgeon: Jovita Kussmaul, MD;  Location: [place];  Service: General;  Laterality: N/A;   VASCULAR SURGERY     VASECTOMY     VIDEO BRONCHOSCOPY WITH ENDOBRONCHIAL ULTRASOUND Right 02/23/2022   Procedure: VIDEO BRONCHOSCOPY WITH ENDOBRONCHIAL ULTRASOUND;  Surgeon: Collene Gobble, MD;  Location: [facility] ENDOSCOPY;  Service: Pulmonary;  Laterality: Right;   VIDEO BRONCHOSCOPY WITH RADIAL ENDOBRONCHIAL ULTRASOUND  02/23/2022   Procedure: VIDEO BRONCHOSCOPY WITH RADIAL ENDOBRONCHIAL ULTRASOUND;  Surgeon: Collene Gobble, MD;  Location: [facility] ENDOSCOPY;  Service: Pulmonary;;   HPI:  Pt is a 76 y.o. male who presented to ED with fatigue. He reports that  his last chemo was 2 weeks ago. Since that time, he has felt increased fatigue. During this time, he's also had difficulty swallowing solid foods; and thus, his PO intake has been poor. He went to his onco clinic yesterday for check up. They drew blood work and it was concerning for DKA. PMH: small cell carcinoma of lung,  HLD, COPD, TAAA, HTN, a flutter.    Assessment / Plan / Recommendation  Clinical Impression  Pt presents with functional oropharyngeal swallow at bedside and reports that he has returned to  baseline. He had trouble swallowing solids for a few weeks PTA, reporting that food felt stuck in throat and "moved slowly down". He denied any coughing or choking with solids or liquids. He reports esophageal dilation 10 years ago and wondered if he needed this done again. Oral mechanism examination unremarkable and pt presents with adequate natural dentition (very few missing upper dentition). He self-fed thin liquids by straw, bites of puree and regular textures exhibiting x1 brief overt cough post consecutive sipping of thin liquids (roughly 4oz). Otherwise, solids and large sips of thin liquids were without clinical signs of aspiration across multiple trials. No globus sensation reported and pt stated his swallowing felt "back to normal". RN stated he took whole pills with liquid without difficulty. Recommend continue regular diet/thin liquids with adherence to universal swallow/aspiration precautions. No further SLP f/u indicated at this time. Will s/o.  SLP Visit Diagnosis: Dysphagia, unspecified (R13.10)    Aspiration Risk  Mild aspiration risk    Diet Recommendation Regular;Thin liquid   Liquid Administration via: Cup;Straw Medication Administration: Whole meds with liquid Supervision: Patient able to self feed Compensations: Minimize environmental distractions;Slow rate;Small sips/bites Postural Changes: Seated upright at 90 degrees;Remain upright for at least 30 minutes after po intake    Other  Recommendations Oral Care Recommendations: Oral care BID    Recommendations for follow up therapy are one component of a multi-disciplinary discharge planning process, led by the attending physician.  Recommendations may be updated based on patient status, additional functional criteria and insurance authorization.  Follow up Recommendations No SLP follow up      Assistance Recommended at Discharge PRN  Functional Status Assessment Patient has not had a recent decline in their functional  status  Frequency and Duration            Prognosis Prognosis for Safe Diet Advancement: Good      Swallow Study   General Date of Onset: 05/27/22 HPI: Pt is a 76 y.o. male who presented to ED with fatigue. He reports that his last chemo was 2 weeks ago. Since that time, he has felt increased fatigue. During this time, he's also had difficulty swallowing solid foods; and thus, his PO intake has been poor. He went to his onco clinic yesterday for check up. They drew blood work and it was concerning for DKA. PMH: small cell carcinoma of lung,  HLD, COPD, TAAA, HTN, a flutter. Type of Study: Bedside Swallow Evaluation Previous Swallow Assessment: none per EMR Diet Prior to this Study: Regular;Thin liquids Temperature Spikes Noted: No Respiratory Status: Room air History of Recent Intubation: No Behavior/Cognition: Alert;Pleasant mood;Cooperative Oral Cavity Assessment: Within Functional Limits Oral Care Completed by SLP: No Oral Cavity - Dentition: Adequate natural dentition;Missing dentition (upper partial unavailable) Vision: Functional for self-feeding Self-Feeding Abilities: Able to feed self Patient Positioning: Upright in bed;Postural control adequate for testing Baseline Vocal Quality: Normal Volitional Cough: Strong Volitional Swallow: Able to elicit    Oral/Motor/Sensory  Function Overall Oral Motor/Sensory Function: Within functional limits   Ice Chips Ice chips: Not tested   Thin Liquid Thin Liquid: Within functional limits Presentation: Self Fed;Cup;Straw    Nectar Thick Nectar Thick Liquid: Not tested   Honey Thick Honey Thick Liquid: Not tested   Puree Puree: Within functional limits Presentation: Self Fed;Spoon   Solid     Solid: Within functional limits Presentation: [place], [place], [place] Office Number: [phone]  Acie Fredrickson 05/28/2022,9:33 AM

## 2022-05-28 NOTE — Progress Notes (Signed)
Met with pt to review concerns he had noted about housing and transportation issues during admission screening.   In discussion, pt notes his wife is not always available get him to appts at the [facility] but he has received assistance from the [facility] team there before and knows how to coordinate that.   Regarding housing, his concerns are about repair needs that he has and his ability to manage those on his own.  Have placed community resources for both housing and transportation resources on AVS.  He denies any other concerns at this time. Please reorder TOC if new needs arise.  Adam Njie, LCSW

## 2022-05-28 NOTE — Inpatient Diabetes Management (Addendum)
Inpatient Diabetes Program Recommendations  AACE/ADA: New Consensus Statement on Inpatient Glycemic Control (2015)  Target Ranges:  Prepandial:   less than 140 mg/dL      Peak postprandial:   less than 180 mg/dL (1-2 hours)      Critically ill patients:  140 - 180 mg/dL   Lab Results  Component Value Date   GLUCAP 296 (H) 05/28/2022   HGBA1C 10.1 (H) 05/27/2022    Review of Glycemic Control  Diabetes history: New-onset Outpatient Diabetes medications: None Current orders for Inpatient glycemic control: Semglee 5 units QD, Novolog 0-9 units TID with meals and 0-5 HS  Post-prandials elevated, will need meal coverage insulin  Inpatient Diabetes Program Recommendations:    Consider adding Novolog 3 units TID with meals if eating > 50%  Increase Semglee to 6 units QD  Will need to make f/u appt with PCP within a week or two of discharge  Will need blood glucose meter at discharge (#[id number])  Recommend OP Diabetes Education consult for new-onset  Insulin pen needles (#[id number])  Continue to follow and make recommendations until discharge.  Thank you. Lorenda Peck, RD, LDN, CDCES Inpatient Diabetes Coordinator [phone]   Addendum: Spoke with pt about his HgbA1C of 10.1% (average blood sugar of 243 mg/dL) and asked if he had any questions/concerns regarding insulin administration, monitoring blood sugars, nutrition, and f/u with PCP. Answered all questions. RN is allowing pt to give all insulin injections and stick fingers for CBG checks. Pt states he will f/u with his PCP after discharge.

## 2022-05-29 DIAGNOSIS — E1110 Type 2 diabetes mellitus with ketoacidosis without coma: Secondary | ICD-10-CM | POA: Diagnosis not present

## 2022-05-29 LAB — COMPREHENSIVE METABOLIC PANEL
ALT: 21 U/L (ref 0–44)
AST: 18 U/L (ref 15–41)
Albumin: 2.8 g/dL — ABNORMAL LOW (ref 3.5–5.0)
Alkaline Phosphatase: 42 U/L (ref 38–126)
Anion gap: 3 — ABNORMAL LOW (ref 5–15)
BUN: 11 mg/dL (ref 8–23)
CO2: 27 mmol/L (ref 22–32)
Calcium: 8.6 mg/dL — ABNORMAL LOW (ref 8.9–10.3)
Chloride: 105 mmol/L (ref 98–111)
Creatinine, Ser: 0.6 mg/dL — ABNORMAL LOW (ref 0.61–1.24)
GFR, Estimated: >60 mL/min (ref >60)
Glucose, Bld: 174 mg/dL — ABNORMAL HIGH (ref 70–99)
Potassium: 3.7 mmol/L (ref 3.5–5.1)
Sodium: 135 mmol/L (ref 135–145)
Total Bilirubin: 0.6 mg/dL (ref 0.3–1.2)
Total Protein: 5.1 g/dL — ABNORMAL LOW (ref 6.5–8.1)

## 2022-05-29 LAB — CBC
HCT: 29.2 % — ABNORMAL LOW (ref 39.0–52.0)
Hemoglobin: 10.2 g/dL — ABNORMAL LOW (ref 13.0–17.0)
MCH: 32.2 pg (ref 26.0–34.0)
MCHC: 34.9 g/dL (ref 30.0–36.0)
MCV: 92.1 fL (ref 80.0–100.0)
Platelets: 97 10*3/uL — ABNORMAL LOW (ref 150–400)
RBC: 3.17 MIL/uL — ABNORMAL LOW (ref 4.22–5.81)
RDW: 14.3 % (ref 11.5–15.5)
WBC: 4.3 10*3/uL (ref 4.0–10.5)
nRBC: 0 % (ref 0.0–0.2)

## 2022-05-29 LAB — MAGNESIUM: Magnesium: 1.8 mg/dL (ref 1.7–2.4)

## 2022-05-29 MED ORDER — HEPARIN SOD (PORK) LOCK FLUSH 100 UNIT/ML IV SOLN
500.0000 [IU] | INTRAVENOUS | Status: AC | PRN
  Administered 2022-05-29: 500 [IU]
  Filled 2022-05-29: qty 5

## 2022-05-29 MED ORDER — BLOOD GLUCOSE METER KIT: PACK | 0 refills | Status: DC

## 2022-05-29 MED ORDER — INSULIN GLARGINE 100 UNIT/ML SOLOSTAR PEN: 12.0000 [IU] | PEN_INJECTOR | Freq: Every day | SUBCUTANEOUS | 11 refills | Status: DC

## 2022-05-29 MED ORDER — INSULIN PEN NEEDLE 32G X 4 MM MISC: 12.0000 [IU] | Freq: Every day | 2 refills | Status: DC

## 2022-05-29 NOTE — Progress Notes (Signed)
Assessment unchanged. Verbalized understanding of dc instructions including medications and follow up care. Diabetic starter kit given already been practicing. Pt has living with Diabetes booklet. Successfully gave insulin to self this am. Discharged via wc to front entrance by NT.

## 2022-05-29 NOTE — Plan of Care (Signed)
Problem: Education: Goal: Ability to describe self-care measures that may prevent or decrease complications (Diabetes Survival Skills Education) will improve Outcome: Adequate for Discharge Goal: Individualized Educational Video(s) Outcome: Adequate for Discharge   Problem: Coping: Goal: Ability to adjust to condition or change in health will improve Outcome: Adequate for Discharge   Problem: Fluid Volume: Goal: Ability to maintain a balanced intake and output will improve Outcome: Adequate for Discharge   Problem: Health Behavior/Discharge Planning: Goal: Ability to identify and utilize available resources and services will improve Outcome: Adequate for Discharge Goal: Ability to manage health-related needs will improve Outcome: Adequate for Discharge   Problem: Metabolic: Goal: Ability to maintain appropriate glucose levels will improve Outcome: Adequate for Discharge   Problem: Nutritional: Goal: Maintenance of adequate nutrition will improve Outcome: Adequate for Discharge Goal: Progress toward achieving an optimal weight will improve Outcome: Adequate for Discharge   Problem: Skin Integrity: Goal: Risk for impaired skin integrity will decrease Outcome: Adequate for Discharge   Problem: Tissue Perfusion: Goal: Adequacy of tissue perfusion will improve Outcome: Adequate for Discharge   Problem: Education: Goal: Ability to describe self-care measures that may prevent or decrease complications (Diabetes Survival Skills Education) will improve Outcome: Adequate for Discharge Goal: Individualized Educational Video(s) Outcome: Adequate for Discharge   Problem: Cardiac: Goal: Ability to maintain an adequate cardiac output will improve Outcome: Adequate for Discharge   Problem: Health Behavior/Discharge Planning: Goal: Ability to identify and utilize available resources and services will improve Outcome: Adequate for Discharge Goal: Ability to manage health-related  needs will improve Outcome: Adequate for Discharge   Problem: Fluid Volume: Goal: Ability to achieve a balanced intake and output will improve Outcome: Adequate for Discharge   Problem: Metabolic: Goal: Ability to maintain appropriate glucose levels will improve Outcome: Adequate for Discharge   Problem: Nutritional: Goal: Maintenance of adequate nutrition will improve Outcome: Adequate for Discharge Goal: Maintenance of adequate weight for body size and type will improve Outcome: Adequate for Discharge   Problem: Respiratory: Goal: Will regain and/or maintain adequate ventilation Outcome: Adequate for Discharge   Problem: Urinary Elimination: Goal: Ability to achieve and maintain adequate renal perfusion and functioning will improve Outcome: Adequate for Discharge   Problem: Education: Goal: Knowledge of General Education information will improve Description: Including pain rating scale, medication(s)/side effects and non-pharmacologic comfort measures Outcome: Adequate for Discharge   Problem: Health Behavior/Discharge Planning: Goal: Ability to manage health-related needs will improve Outcome: Adequate for Discharge   Problem: Clinical Measurements: Goal: Ability to maintain clinical measurements within normal limits will improve Outcome: Adequate for Discharge Goal: Will remain free from infection Outcome: Adequate for Discharge Goal: Diagnostic test results will improve Outcome: Adequate for Discharge Goal: Respiratory complications will improve Outcome: Adequate for Discharge Goal: Cardiovascular complication will be avoided Outcome: Adequate for Discharge   Problem: Activity: Goal: Risk for activity intolerance will decrease Outcome: Adequate for Discharge   Problem: Nutrition: Goal: Adequate nutrition will be maintained Outcome: Adequate for Discharge   Problem: Coping: Goal: Level of anxiety will decrease Outcome: Adequate for Discharge   Problem:  Elimination: Goal: Will not experience complications related to bowel motility Outcome: Adequate for Discharge Goal: Will not experience complications related to urinary retention Outcome: Adequate for Discharge   Problem: Pain Managment: Goal: General experience of comfort will improve Outcome: Adequate for Discharge   Problem: Safety: Goal: Ability to remain free from injury will improve Outcome: Adequate for Discharge   Problem: Skin Integrity: Goal: Risk for impaired skin integrity will decrease  Outcome: Adequate for Discharge   Problem: Food- and Nutrition-Related Knowledge Deficit (NB-1.1) Goal: Nutrition education Description: Formal process to instruct or train a patient/client in a skill or to impart knowledge to help patients/clients voluntarily manage or modify food choices and eating behavior to maintain or improve health. Outcome: Adequate for Discharge

## 2022-05-29 NOTE — Discharge Summary (Signed)
DISCHARGE SUMMARY  Adam Jordan  MR#: [id number]  DOB:07-05-1946  Date of Admission: 05/27/2022 Date of Discharge: 05/29/2022  Attending Physician:Wendolyn Raso Hennie Duos, MD  Patient's DZH:[id number], Adam Dyer, DO  Consults: none   Disposition: D/C home   Follow-up Appts:  Follow-up Information     [name], DO Follow up in 1 week(s).   Specialty: Family Medicine Contact information: [address] [place] [address] [phone]                 Tests Needing Follow-up: -assess CBG control and assure pt comfortable checking CBGs and administering insulin  -further titrate insulin dose as needed based on CBG control   Discharge Diagnoses: Newly diagnosed severely uncontrolled DM2 - HHS v/s early DKA Small cell lung cancer Hypokalemia Normocytic anemia and thrombocytopenia HLD COPD Chronic paroxysmal atrial flutter  Initial presentation: 76 year old with a history of small cell carcinoma of the lung, COPD, HLD, atrial flutter, HTN, and thoracoabdominal aortic aneurysm who is currently active on chemotherapy and presented to the ER with complaints of severe fatigue worsening since his last treatment 2 weeks prior.  This has been accompanied by sensation of difficulty swallowing solid foods with limited oral intake.  He was seen in the oncology clinic where labs were "concerning for DKA" and he was sent to the ER for evaluation.  Hospital Course:  Newly diagnosed severely uncontrolled DM2 - HHS v/s early DKA CBG now controlled after use of insulin infusion - successfully transitioned off insulin infusion - monitored CBGs closely with resumption of oral intake - A1c 10.1 - educated on use of insulin and home CBG monitoring and patient was comfortable doing both on his own prior to his discharge -given his presentation with DKA and ongoing chemotherapy continued use of insulin was felt to be most appropriate -once the patient's chemotherapy course has  been completed and if his CBGs stabilized but not BBI on question to consider transitioning to oral medical therapy for a trial   Small cell lung cancer Followed by Dr. Earlie Server -no acute issues requiring inpatient consultation   Dysphagia Symptoms have resolved -no findings on SLP eval -no further work-up indicated   Hypokalemia A consequence of treatment of hyperglycemia -supplemented    Normocytic anemia and thrombocytopenia Felt to be a consequence of his chemotherapy -stable   HLD Continue usual home medical therapy   COPD Continue usual home medical therapy -well compensated at present   Chronic paroxysmal atrial flutter Continue usual home medical therapies -rate well controlled at present  Allergies as of 05/29/2022       Reactions   Losartan Anaphylaxis   Atorvastatin Calcium    Causes agitation at 40 mg, tolerates 20 mg   Pollen Extract-tree Extract [pollen Extract]         Medication List     TAKE these medications    acetaminophen 500 MG tablet Commonly known as: TYLENOL Take 500 mg by mouth daily as needed for moderate pain or headache.   apixaban 5 MG Tabs tablet Commonly known as: ELIQUIS Take 1 tablet (5 mg total) by mouth 2 (two) times daily. Okay to restart this medication on 02/24/2022   atorvastatin 20 MG tablet Commonly known as: LIPITOR Take 20 mg by mouth at bedtime.   blood glucose meter kit and supplies Dispense based on patient and insurance preference. Use up to four times daily as directed. (FOR ICD-10 E10.9, E11.9).   CLEAR EYES OP Place 1 drop into both eyes daily.  clonazePAM 1 MG tablet Commonly known as: KLONOPIN Take 1 tablet (1 mg total) by mouth 2 (two) times daily as needed for anxiety.   fluticasone 50 MCG/ACT nasal spray Commonly known as: FLONASE Place 1 spray into both nostrils 2 (two) times daily as needed for allergies.   Gaviscon 80-14.2 MG Chew Generic drug: Alum Hydroxide-Mag Trisilicate Chew 1 tablet by  mouth daily as needed (heartburn).   insulin glargine 100 UNIT/ML Solostar Pen Commonly known as: LANTUS Inject 12 Units into the skin at bedtime.   Insulin Pen Needle 32G X 4 MM Misc 12 Units by Does not apply route at bedtime.   lidocaine-prilocaine cream Commonly known as: EMLA Apply to the Port-A-Cath site 30-60 minutes before chemotherapy   meloxicam 15 MG tablet Commonly known as: MOBIC Take 15 mg by mouth daily as needed for pain.   multivitamin with minerals Tabs tablet Take 1 tablet by mouth daily.   ProAir HFA 108 (90 Base) MCG/ACT inhaler Generic drug: albuterol Inhale 2 puffs into the lungs every 6 (six) hours as needed for wheezing or shortness of breath.   prochlorperazine 10 MG tablet Commonly known as: COMPAZINE Take 1 tablet (10 mg total) by mouth every 6 (six) hours as needed for nausea or vomiting.   sildenafil 20 MG tablet Commonly known as: REVATIO Take 60 mg by mouth daily as needed (ED).   Stiolto Respimat 2.5-2.5 MCG/ACT Aers Generic drug: Tiotropium Bromide-Olodaterol Inhale 2 puffs into the lungs daily.   traZODone 50 MG tablet Commonly known as: DESYREL Take 50 mg by mouth at bedtime.   triamcinolone cream 0.1 % Commonly known as: KENALOG Apply 1 Application topically daily.        Day of Discharge BP (!) 104/56 (BP Location: Left Arm)   Pulse 61   Temp 97.8 F (36.6 C) (Oral)   Resp 16   Ht _0  (1.727 m)   Wt 70.3 kg   SpO2 97%   BMI 23.57 kg/m   Physical Exam: General: No acute respiratory distress Lungs: Clear to auscultation bilaterally without wheezes or crackles Cardiovascular: Regular rate and rhythm without murmur gallop or rub normal S1 and S2 Abdomen: Nontender, nondistended, soft, bowel sounds positive, no rebound, no ascites, no appreciable mass Extremities: No significant cyanosis, clubbing, or edema bilateral lower extremities  Basic Metabolic Panel: Recent Labs  Lab 05/26/22 1326 05/27/22 1152  05/27/22 1532 05/28/22 0249 05/29/22 0337  NA 132* 135 135 135 135  K 5.0 4.6 3.8 3.3* 3.7  CL 96* 105 106 104 105  CO2 16* 19* _1 GLUCOSE 547* 453* 134* 155* 174*  BUN 26* _2 CREATININE 1.01 0.89 0.71 0.60* 0.60*  CALCIUM 9.7 9.2 8.9 8.7* 8.6*  MG  --   --   --   --  1.8    CBC: Recent Labs  Lab 05/26/22 1326 05/27/22 1152 05/29/22 0337  WBC 6.0 4.8 4.3  NEUTROABS 4.2 3.0  --   HGB 11.8* 10.3* 10.2*  HCT 34.3* 30.4* 29.2*  MCV 92.0 94.4 92.1  PLT 101* 91* 97*   CBG: Recent Labs  Lab 05/27/22 1946 05/27/22 2045 05/27/22 2159 05/28/22 0800 05/28/22 1122  GLUCAP 216* 177* 161* 188* 296*    Time spent in discharge (includes decision making & examination of pt): 35 minutes  05/29/2022, 10:41 AM   Cherene Altes, MD [facility] Office  [phone]

## 2022-05-30 ENCOUNTER — Encounter (HOSPITAL_COMMUNITY): Payer: Self-pay

## 2022-05-30 ENCOUNTER — Other Ambulatory Visit: Payer: Self-pay

## 2022-05-30 ENCOUNTER — Emergency Department (HOSPITAL_COMMUNITY)
Admission: EM | Admit: 2022-05-30 | Discharge: 2022-05-31 | Disposition: A | Discharge status: Home / Self Care | Payer: Medicare HMO | Attending: Emergency Medicine | Admitting: Emergency Medicine

## 2022-05-30 DIAGNOSIS — Z794 Long term (current) use of insulin: Secondary | ICD-10-CM | POA: Diagnosis not present

## 2022-05-30 DIAGNOSIS — Z85118 Personal history of other malignant neoplasm of bronchus and lung: Secondary | ICD-10-CM | POA: Diagnosis not present

## 2022-05-30 DIAGNOSIS — R739 Hyperglycemia, unspecified: Secondary | ICD-10-CM | POA: Diagnosis present

## 2022-05-30 DIAGNOSIS — E1165 Type 2 diabetes mellitus with hyperglycemia: Secondary | ICD-10-CM | POA: Diagnosis not present

## 2022-05-30 DIAGNOSIS — Z7901 Long term (current) use of anticoagulants: Secondary | ICD-10-CM | POA: Diagnosis not present

## 2022-05-30 LAB — BLOOD GAS, VENOUS
Acid-Base Excess: 2.3 mmol/L — ABNORMAL HIGH (ref 0.0–2.0)
Bicarbonate: 28.3 mmol/L — ABNORMAL HIGH (ref 20.0–28.0)
O2 Saturation: 24.8 %
Patient temperature: 37
pCO2, Ven: 49 mmHg (ref 44–60)
pH, Ven: 7.37 (ref 7.25–7.43)
pO2, Ven: <31 mmHg — CL (ref 32–45)

## 2022-05-30 LAB — COMPREHENSIVE METABOLIC PANEL
ALT: 29 U/L (ref 0–44)
AST: 21 U/L (ref 15–41)
Albumin: 3.7 g/dL (ref 3.5–5.0)
Alkaline Phosphatase: 51 U/L (ref 38–126)
Anion gap: 9 (ref 5–15)
BUN: 21 mg/dL (ref 8–23)
CO2: 24 mmol/L (ref 22–32)
Calcium: 9.4 mg/dL (ref 8.9–10.3)
Chloride: 100 mmol/L (ref 98–111)
Creatinine, Ser: 0.88 mg/dL (ref 0.61–1.24)
GFR, Estimated: >60 mL/min (ref >60)
Glucose, Bld: 371 mg/dL — ABNORMAL HIGH (ref 70–99)
Potassium: 4.5 mmol/L (ref 3.5–5.1)
Sodium: 133 mmol/L — ABNORMAL LOW (ref 135–145)
Total Bilirubin: 0.9 mg/dL (ref 0.3–1.2)
Total Protein: 6.6 g/dL (ref 6.5–8.1)

## 2022-05-30 LAB — CBC WITH DIFFERENTIAL/PLATELET
Abs Immature Granulocytes: 0.03 10*3/uL (ref 0.00–0.07)
Basophils Absolute: 0.1 10*3/uL (ref 0.0–0.1)
Basophils Relative: 1 %
Eosinophils Absolute: 0.1 10*3/uL (ref 0.0–0.5)
Eosinophils Relative: 2 %
HCT: 33.6 % — ABNORMAL LOW (ref 39.0–52.0)
Hemoglobin: 11.4 g/dL — ABNORMAL LOW (ref 13.0–17.0)
Immature Granulocytes: 1 %
Lymphocytes Relative: 30 %
Lymphs Abs: 1.4 10*3/uL (ref 0.7–4.0)
MCH: 32.1 pg (ref 26.0–34.0)
MCHC: 33.9 g/dL (ref 30.0–36.0)
MCV: 94.6 fL (ref 80.0–100.0)
Monocytes Absolute: 0.7 10*3/uL (ref 0.1–1.0)
Monocytes Relative: 15 %
Neutro Abs: 2.4 10*3/uL (ref 1.7–7.7)
Neutrophils Relative %: 51 %
Platelets: 136 10*3/uL — ABNORMAL LOW (ref 150–400)
RBC: 3.55 MIL/uL — ABNORMAL LOW (ref 4.22–5.81)
RDW: 14.6 % (ref 11.5–15.5)
WBC: 4.7 10*3/uL (ref 4.0–10.5)
nRBC: 0 % (ref 0.0–0.2)

## 2022-05-30 LAB — CBG MONITORING, ED: Glucose-Capillary: 377 mg/dL — ABNORMAL HIGH (ref 70–99)

## 2022-05-30 MED ORDER — SODIUM CHLORIDE 0.9 % IV BOLUS
1000.0000 mL | Freq: Once | INTRAVENOUS | Status: AC
  Administered 2022-05-30: 1000 mL via INTRAVENOUS

## 2022-05-30 MED ORDER — INSULIN ASPART 100 UNIT/ML IJ SOLN
0.0000 [IU] | INTRAMUSCULAR | Status: DC
  Administered 2022-05-31: 7 [IU] via SUBCUTANEOUS
  Filled 2022-05-30: qty 0.09

## 2022-05-30 NOTE — ED Provider Notes (Signed)
[facility] DEPT Provider Note   CSN: [id number] Arrival date & time: 05/30/22  2044     History {Add pertinent medical, surgical, social history, OB history to HPI:1} Chief Complaint  Patient presents with   Hyperglycemia    Adam Jordan is a 76 y.o. male.  The history is provided by the patient and medical records.  Hyperglycemia Adam Jordan is a 76 y.o. male who presents to the Emergency Department complaining of hyperglycemia. Recently dx with diabetes.  Blood sugar 170 to 290 after breakfast.   Discharged recently Blood sugar at home 470, then 555.   Lantus 12 units qhs.  - took yesterday and this evening.   Feels weak, "chemo brain"  feels unsteady when walking.  Felt better prior to discharge.  This afternoon started to feel slugish.       Home Medications Prior to Admission medications   Medication Sig Start Date End Date Taking? Authorizing Provider  acetaminophen (TYLENOL) 500 MG tablet Take 500 mg by mouth daily as needed for moderate pain or headache.    [provider]  Alum Hydroxide-Mag Trisilicate (GAVISCON) 34-19.3 MG CHEW Chew 1 tablet by mouth daily as needed (heartburn).    [provider]  apixaban (ELIQUIS) 5 MG TABS tablet Take 1 tablet (5 mg total) by mouth 2 (two) times daily. Okay to restart this medication on 02/24/2022 02/23/22   Collene Gobble, MD  atorvastatin (LIPITOR) 20 MG tablet Take 20 mg by mouth at bedtime. 10/18/19   [provider]  blood glucose meter kit and supplies Dispense based on patient and insurance preference. Use up to four times daily as directed. (FOR ICD-10 E10.9, E11.9). 05/29/22   Cherene Altes, MD  clonazePAM (KLONOPIN) 1 MG tablet Take 1 tablet (1 mg total) by mouth 2 (two) times daily as needed for anxiety. 07/17/15   Sinda Du, MD  fluticasone (FLONASE) 50 MCG/ACT nasal spray Place 1 spray into both nostrils 2 (two) times daily as needed for  allergies.    [provider]  insulin glargine (LANTUS) 100 UNIT/ML Solostar Pen Inject 12 Units into the skin at bedtime. 05/29/22   Cherene Altes, MD  Insulin Pen Needle 32G X 4 MM MISC 12 Units by Does not apply route at bedtime. 05/29/22   Cherene Altes, MD  lidocaine-prilocaine (EMLA) cream Apply to the Port-A-Cath site 30-60 minutes before chemotherapy Patient not taking: Reported on 05/27/2022 03/04/22   Curt Bears, MD  meloxicam (MOBIC) 15 MG tablet Take 15 mg by mouth daily as needed for pain.    [provider]  Multiple Vitamin (MULTIVITAMIN WITH MINERALS) TABS tablet Take 1 tablet by mouth daily. 07/17/15   Sinda Du, MD  Naphazoline HCl (CLEAR EYES OP) Place 1 drop into both eyes daily.    [provider]  PROAIR HFA 108 (90 BASE) MCG/ACT inhaler Inhale 2 puffs into the lungs every 6 (six) hours as needed for wheezing or shortness of breath.  04/11/15   [provider]  prochlorperazine (COMPAZINE) 10 MG tablet Take 1 tablet (10 mg total) by mouth every 6 (six) hours as needed for nausea or vomiting. 03/04/22   Curt Bears, MD  sildenafil (REVATIO) 20 MG tablet Take 60 mg by mouth daily as needed (ED). 01/18/14   [provider]  Tiotropium Bromide-Olodaterol (STIOLTO RESPIMAT) 2.5-2.5 MCG/ACT AERS Inhale 2 puffs into the lungs daily. Patient not taking: Reported on 05/27/2022 04/06/22   Collene Gobble,  MD  traZODone (DESYREL) 50 MG tablet Take 50 mg by mouth at bedtime. 03/24/22   [provider]  triamcinolone cream (KENALOG) 0.1 % Apply 1 Application topically daily. 02/07/22   [provider]      Allergies    Losartan, Atorvastatin calcium, and Pollen extract-tree extract [pollen extract]    Review of Systems   Review of Systems  All other systems reviewed and are negative.   Physical Exam Updated Vital Signs BP (!) 140/72 (BP Location: Right Arm)   Pulse 75   Temp 98.2 F (36.8 C) (Oral)    Resp 18   Ht _0  (1.727 m)   Wt 70.3 kg   SpO2 99%   BMI 23.57 kg/m  Physical Exam Vitals and nursing note reviewed.  Constitutional:      Appearance: He is well-developed.  HENT:     Head: Normocephalic and atraumatic.  Cardiovascular:     Rate and Rhythm: Normal rate and regular rhythm.     Heart sounds: No murmur heard. Pulmonary:     Effort: Pulmonary effort is normal. No respiratory distress.     Breath sounds: Normal breath sounds.  Abdominal:     Palpations: Abdomen is soft.     Tenderness: There is no abdominal tenderness. There is no guarding or rebound.  Musculoskeletal:        General: No swelling or tenderness.  Skin:    General: Skin is warm and dry.  Neurological:     Mental Status: He is alert and oriented to person, place, and time.     Comments: 5/5 strength in all four extremities.   Psychiatric:        Behavior: Behavior normal.     ED Results / Procedures / Treatments   Labs (all labs ordered are listed, but only abnormal results are displayed) Labs Reviewed  CBG MONITORING, ED - Abnormal; Notable for the following components:      Result Value   Glucose-Capillary 377 (*)    All other components within normal limits  CBC WITH DIFFERENTIAL/PLATELET  COMPREHENSIVE METABOLIC PANEL  BLOOD GAS, VENOUS  URINALYSIS, ROUTINE W REFLEX MICROSCOPIC    EKG None  Radiology No results found.  Procedures Procedures  {Document cardiac monitor, telemetry assessment procedure when appropriate:1}  Medications Ordered in ED Medications - No data to display  ED Course/ Medical Decision Making/ A&P                           Medical Decision Making  ***  {Document critical care time when appropriate:1} {Document review of labs and clinical decision tools ie heart score, Chads2Vasc2 etc:1}  {Document your independent review of radiology images, and any outside records:1} {Document your discussion with family members, caretakers, and with  consultants:1} {Document social determinants of health affecting pt's care:1} {Document your decision making why or why not admission, treatments were needed:1} Final Clinical Impression(s) / ED Diagnoses Final diagnoses:  None    Rx / DC Orders ED Discharge Orders     None

## 2022-05-30 NOTE — ED Triage Notes (Signed)
Complaining of his blood sugar being elevated since he went home 2 days ago. Has been up as high as 497.

## 2022-05-30 NOTE — ED Provider Triage Note (Signed)
Emergency Medicine Provider Triage Evaluation Note  ALEXXANDER KURT , a 76 y.o. male  was evaluated in triage.  Pt complains of hyperglycemia.  He was just admitted to the hospital and was discharged on Lantus.  He has been using his medication as prescribed, but CBG became as high as 557 today.  He is not experiencing polyuria or polydipsia like he did with his last admission.  No fevers since time of discharge.  He is actively undergoing chemotherapy.  Review of Systems  Positive: As above Negative: As above  Physical Exam  BP (!) 140/72 (BP Location: Right Arm)   Pulse 75   Temp 98.2 F (36.8 C) (Oral)   Resp 18   Ht 5\' 8"  (1.727 m)   Wt 70.3 kg   SpO2 99%   BMI 23.57 kg/m  Gen:   Awake, no distress   Resp:  Normal effort  MSK:   Moves extremities without difficulty  Other:  Heart RRR  Medical Decision Making  Medically screening exam initiated at 10:52 PM.  Appropriate orders placed.  SHABAZZ MCKEY was informed that the remainder of the evaluation will be completed by another provider, this initial triage assessment does not replace that evaluation, and the importance of remaining in the ED until their evaluation is complete.  Hyperglycemia   Antonietta Breach, PA-C 05/30/22 2253

## 2022-05-31 DIAGNOSIS — E1165 Type 2 diabetes mellitus with hyperglycemia: Secondary | ICD-10-CM | POA: Diagnosis not present

## 2022-05-31 LAB — URINALYSIS, ROUTINE W REFLEX MICROSCOPIC
Bilirubin Urine: NEGATIVE
Glucose, UA: >=500 mg/dL — AB
Hgb urine dipstick: NEGATIVE
Ketones, ur: 40 mg/dL — AB
Leukocytes,Ua: NEGATIVE
Nitrite: NEGATIVE
Protein, ur: NEGATIVE mg/dL
Specific Gravity, Urine: 1.02 (ref 1.005–1.030)
pH: 5.5 (ref 5.0–8.0)

## 2022-05-31 LAB — URINALYSIS, MICROSCOPIC (REFLEX): Bacteria, UA: NONE SEEN

## 2022-05-31 LAB — GLUCOSE, CAPILLARY
Glucose-Capillary: 313 mg/dL — ABNORMAL HIGH (ref 70–99)
Glucose-Capillary: 364 mg/dL — ABNORMAL HIGH (ref 70–99)
Glucose-Capillary: 171 mg/dL — ABNORMAL HIGH (ref 70–99)
Glucose-Capillary: 350 mg/dL — ABNORMAL HIGH (ref 70–99)

## 2022-05-31 LAB — CBG MONITORING, ED
Glucose-Capillary: 257 mg/dL — ABNORMAL HIGH (ref 70–99)
Glucose-Capillary: 301 mg/dL — ABNORMAL HIGH (ref 70–99)

## 2022-05-31 NOTE — Discharge Instructions (Addendum)
Increase your insulin (lantus) to 14 units each evening. Please call your doctor for follow-up.

## 2022-06-03 ENCOUNTER — Inpatient Hospital Stay: Payer: Medicare HMO | Attending: Internal Medicine

## 2022-06-03 ENCOUNTER — Inpatient Hospital Stay: Payer: Medicare HMO

## 2022-06-03 ENCOUNTER — Inpatient Hospital Stay (HOSPITAL_BASED_OUTPATIENT_CLINIC_OR_DEPARTMENT_OTHER): Payer: Medicare HMO | Admitting: Internal Medicine

## 2022-06-03 ENCOUNTER — Other Ambulatory Visit: Payer: Self-pay | Admitting: Internal Medicine

## 2022-06-03 ENCOUNTER — Encounter: Payer: Self-pay | Admitting: Internal Medicine

## 2022-06-03 VITALS — BP 151/77 | HR 86 | Temp 97.9°F | Resp 16 | Ht 68.0 in | Wt 151.5 lb

## 2022-06-03 VITALS — BP 156/77

## 2022-06-03 DIAGNOSIS — Z79899 Other long term (current) drug therapy: Secondary | ICD-10-CM | POA: Diagnosis not present

## 2022-06-03 DIAGNOSIS — E108 Type 1 diabetes mellitus with unspecified complications: Secondary | ICD-10-CM | POA: Insufficient documentation

## 2022-06-03 DIAGNOSIS — Z5112 Encounter for antineoplastic immunotherapy: Secondary | ICD-10-CM | POA: Diagnosis not present

## 2022-06-03 DIAGNOSIS — Z95828 Presence of other vascular implants and grafts: Secondary | ICD-10-CM

## 2022-06-03 DIAGNOSIS — C7951 Secondary malignant neoplasm of bone: Secondary | ICD-10-CM | POA: Diagnosis present

## 2022-06-03 DIAGNOSIS — C3431 Malignant neoplasm of lower lobe, right bronchus or lung: Secondary | ICD-10-CM

## 2022-06-03 DIAGNOSIS — Z87891 Personal history of nicotine dependence: Secondary | ICD-10-CM | POA: Diagnosis not present

## 2022-06-03 LAB — CBC WITH DIFFERENTIAL (CANCER CENTER ONLY)
Abs Immature Granulocytes: 0.05 10*3/uL (ref 0.00–0.07)
Basophils Absolute: 0.1 10*3/uL (ref 0.0–0.1)
Basophils Relative: 1 %
Eosinophils Absolute: 0.1 10*3/uL (ref 0.0–0.5)
Eosinophils Relative: 1 %
HCT: 33 % — ABNORMAL LOW (ref 39.0–52.0)
Hemoglobin: 11.5 g/dL — ABNORMAL LOW (ref 13.0–17.0)
Immature Granulocytes: 1 %
Lymphocytes Relative: 16 %
Lymphs Abs: 1.1 10*3/uL (ref 0.7–4.0)
MCH: 32.1 pg (ref 26.0–34.0)
MCHC: 34.8 g/dL (ref 30.0–36.0)
MCV: 92.2 fL (ref 80.0–100.0)
Monocytes Absolute: 0.9 10*3/uL (ref 0.1–1.0)
Monocytes Relative: 14 %
Neutro Abs: 4.6 10*3/uL (ref 1.7–7.7)
Neutrophils Relative %: 67 %
Platelet Count: 259 10*3/uL (ref 150–400)
RBC: 3.58 MIL/uL — ABNORMAL LOW (ref 4.22–5.81)
RDW: 14.8 % (ref 11.5–15.5)
WBC Count: 6.7 10*3/uL (ref 4.0–10.5)
nRBC: 0 % (ref 0.0–0.2)

## 2022-06-03 LAB — CMP (CANCER CENTER ONLY)
ALT: 27 U/L (ref 0–44)
AST: 18 U/L (ref 15–41)
Albumin: 3.9 g/dL (ref 3.5–5.0)
Alkaline Phosphatase: 51 U/L (ref 38–126)
Anion gap: 11 (ref 5–15)
BUN: 17 mg/dL (ref 8–23)
CO2: 27 mmol/L (ref 22–32)
Calcium: 9.3 mg/dL (ref 8.9–10.3)
Chloride: 95 mmol/L — ABNORMAL LOW (ref 98–111)
Creatinine: 0.84 mg/dL (ref 0.61–1.24)
GFR, Estimated: >60 mL/min (ref >60)
Glucose, Bld: 367 mg/dL — ABNORMAL HIGH (ref 70–99)
Potassium: 3.9 mmol/L (ref 3.5–5.1)
Sodium: 133 mmol/L — ABNORMAL LOW (ref 135–145)
Total Bilirubin: 0.7 mg/dL (ref 0.3–1.2)
Total Protein: 6.5 g/dL (ref 6.5–8.1)

## 2022-06-03 LAB — TSH: TSH: 2.546 u[IU]/mL (ref 0.350–4.500)

## 2022-06-03 MED ORDER — SODIUM CHLORIDE 0.9% FLUSH
10.0000 mL | INTRAVENOUS | Status: DC | PRN
  Administered 2022-06-03: 10 mL

## 2022-06-03 MED ORDER — SODIUM CHLORIDE 0.9 % IV SOLN
1500.0000 mg | Freq: Once | INTRAVENOUS | Status: AC
  Administered 2022-06-03: 1500 mg via INTRAVENOUS
  Filled 2022-06-03: qty 30

## 2022-06-03 MED ORDER — SODIUM CHLORIDE 0.9% FLUSH
10.0000 mL | Freq: Once | INTRAVENOUS | Status: AC
  Administered 2022-06-03: 10 mL

## 2022-06-03 MED ORDER — SODIUM CHLORIDE 0.9 % IV SOLN: Freq: Once | INTRAVENOUS | Status: AC

## 2022-06-03 MED ORDER — HEPARIN SOD (PORK) LOCK FLUSH 100 UNIT/ML IV SOLN
500.0000 [IU] | Freq: Once | INTRAVENOUS | Status: AC | PRN
  Administered 2022-06-03: 500 [IU]

## 2022-06-03 NOTE — Progress Notes (Signed)
[facility] [phone]   [phone]  OFFICE PROGRESS NOTE  Adam Morning, DO [place] [id number]  DIAGNOSIS:  1) Extensive stage (T1c, N2, M1C) small cell lung cancer presented with right lower lobe lung nodule in addition to right hilar and mediastinal lymphadenopathy and bone metastasis involving thoracic spines as well as the right iliac crest diagnosed in July 2023. 2) immunotherapy mediated type 1 diabetes mellitus diagnosed in October 2023.  PRIOR THERAPY: None  CURRENT THERAPY: Systemic chemotherapy with carboplatin for AUC of 5 on day 1, etoposide 100 Mg/M2 on days 1, 2 and 3 with Imfinzi 1500 Mg IV on day 1 and Cosela 240 Mg/M2 on the days of the chemotherapy as well as Imfinzi 1500 Mg IV on day 1 every 3 weeks the first 4 cycles followed by maintenance treatment every 4 weeks starting from cycle #5.  First dose was giving 03/10/2022.  The patient status post 4 cycles.  Starting from cycle #5 the patient will be on maintenance treatment with Imfinzi 1500 Mg IV every 4 weeks.  INTERVAL HISTORY: Adam Jordan 76 y.o. male returns to the clinic today for follow-up visit.  The patient is feeling fine today with no concerning complaints except for mild fatigue.  Unfortunately he developed type 1 diabetes mellitus secondary to immunotherapy with elevation of his blood glucose to over 500 with diabetic ketoacidosis.  He was admitted to the hospital last week for management of his condition and he is supposed to see Dr. Kelton Pillar on June 09, 2022.  He denied having any chest pain, shortness of breath, cough or hemoptysis.  He denied having any fever or chills.  He has no nausea, vomiting, diarrhea or constipation.  Has no headache or visual changes.  He had repeat CT scan of the chest, abdomen and pelvis performed recently and he is here for evaluation and discussion of his scan results.  MEDICAL HISTORY: Past Medical  History:  Diagnosis Date   Anxiety    Arthritis    osteoarthrititis- knees and most joints.   COPD (chronic obstructive pulmonary disease) (HCC)    moderate -no regular use of inhalers- rare use of oxygen as sexual activity   Dyspnea    outside in hot weather and also with pollen   Hyperlipidemia    Hypertension    Neuromuscular disorder ([place])    feet   Paroxysmal atrial flutter ([place]) 01/15/2021   Thoracoabdominal aneurysm (HCC)    s/p FEVAR 4 Vessel TABME 02/19/20 Dr. Katy Apo    ALLERGIES:  is allergic to losartan, atorvastatin calcium, and pollen extract-tree extract [pollen extract].  MEDICATIONS:  Current Outpatient Medications  Medication Sig Dispense Refill   acetaminophen (TYLENOL) 500 MG tablet Take 500 mg by mouth daily as needed for moderate pain or headache.     Alum Hydroxide-Mag Trisilicate (GAVISCON) 41-32.4 MG CHEW Chew 1 tablet by mouth daily as needed (heartburn).     apixaban (ELIQUIS) 5 MG TABS tablet Take 1 tablet (5 mg total) by mouth 2 (two) times daily. Okay to restart this medication on 02/24/2022 180 tablet 3   atorvastatin (LIPITOR) 20 MG tablet Take 20 mg by mouth at bedtime.     blood glucose meter kit and supplies Dispense based on patient and insurance preference. Use up to four times daily as directed. (FOR ICD-10 E10.9, E11.9). 1 each 0   clonazePAM (KLONOPIN) 1 MG tablet Take 1 tablet (1 mg total) by  mouth 2 (two) times daily as needed for anxiety. 60 tablet 5   fluticasone (FLONASE) 50 MCG/ACT nasal spray Place 1 spray into both nostrils 2 (two) times daily as needed for allergies.     insulin glargine (LANTUS) 100 UNIT/ML Solostar Pen Inject 12 Units into the skin at bedtime. 15 mL 11   Insulin Pen Needle 32G X 4 MM MISC 12 Units by Does not apply route at bedtime. 60 each 2   lidocaine-prilocaine (EMLA) cream Apply to the Port-A-Cath site 30-60 minutes before chemotherapy (Patient not taking: Reported on 05/27/2022) 30 g 0   meloxicam (MOBIC) 15  MG tablet Take 15 mg by mouth daily as needed for pain.     Multiple Vitamin (MULTIVITAMIN WITH MINERALS) TABS tablet Take 1 tablet by mouth daily. 30 tablet 5   Naphazoline HCl (CLEAR EYES OP) Place 1 drop into both eyes daily.     PROAIR HFA 108 (90 BASE) MCG/ACT inhaler Inhale 2 puffs into the lungs every 6 (six) hours as needed for wheezing or shortness of breath.   2   prochlorperazine (COMPAZINE) 10 MG tablet Take 1 tablet (10 mg total) by mouth every 6 (six) hours as needed for nausea or vomiting. 30 tablet 0   sildenafil (REVATIO) 20 MG tablet Take 60 mg by mouth daily as needed (ED).     Tiotropium Bromide-Olodaterol (STIOLTO RESPIMAT) 2.5-2.5 MCG/ACT AERS Inhale 2 puffs into the lungs daily. (Patient not taking: Reported on 05/27/2022) 4 g 0   traZODone (DESYREL) 50 MG tablet Take 50 mg by mouth at bedtime.     triamcinolone cream (KENALOG) 0.1 % Apply 1 Application topically daily.     No current facility-administered medications for this visit.    SURGICAL HISTORY:  Past Surgical History:  Procedure Laterality Date   ABDOMINAL AORTIC ANEURYSM REPAIR  02/06/2020   BRONCHIAL BIOPSY  02/23/2022   Procedure: BRONCHIAL BIOPSIES;  Surgeon: Collene Gobble, MD;  Location: [facility] ENDOSCOPY;  Service: Pulmonary;;   BRONCHIAL BRUSHINGS  02/23/2022   Procedure: BRONCHIAL BRUSHINGS;  Surgeon: Collene Gobble, MD;  Location: [facility] ENDOSCOPY;  Service: Pulmonary;;   BRONCHIAL NEEDLE ASPIRATION BIOPSY  02/23/2022   Procedure: BRONCHIAL NEEDLE ASPIRATION BIOPSIES;  Surgeon: Collene Gobble, MD;  Location: [facility] ENDOSCOPY;  Service: Pulmonary;;   BROW LIFT Bilateral 12/11/2021   Procedure: UPPER LID BLEPHAROPLASTY;  Surgeon: Irene Limbo, MD;  Location: [place];  Service: Plastics;  Laterality: Bilateral;   CYST REMOVAL LEG Left 07/03/2021   Procedure: EXCISION CYST LEFT BUTTOCK;  Surgeon: Jovita Kussmaul, MD;  Location: [place];  Service: General;  Laterality: Left;   EYE SURGERY  Bilateral    cataract surgery   FINGER ARTHROPLASTY Left    left thumb-Dr. Amedeo Plenty   INGUINAL HERNIA REPAIR Left 07/03/2021   Procedure: LEFT INGUINAL HERNIA REPAIR WITH MESH;  Surgeon: Jovita Kussmaul, MD;  Location: [facility];  Service: General;  Laterality: Left;   INSERTION OF MESH N/A 07/03/2021   Procedure: INSERTION OF MESH X2;  Surgeon: Jovita Kussmaul, MD;  Location: [facility] [place];  Service: General;  Laterality: N/A;   IR IMAGING GUIDED PORT INSERTION  03/12/2022   IR RADIOLOGIST EVAL & MGMT  10/25/2019   KNEE ARTHROSCOPY Left    KNEE SURGERY Left    Bakers cyst x2   SHOULDER ARTHROSCOPY Right    thumb surgery     TONSILLECTOMY     TOTAL KNEE ARTHROPLASTY Left 03/19/2016   Procedure: LEFT TOTAL KNEE  ARTHROPLASTY;  Surgeon: Sydnee Cabal, MD;  Location: [facility];  Service: Orthopedics;  Laterality: Left;   TOTAL SHOULDER REPLACEMENT Right    UMBILICAL HERNIA REPAIR N/A 07/03/2021   Procedure: UMBILICAL HERNIA REPAIR WITH MESH;  Surgeon: Jovita Kussmaul, MD;  Location: [facility];  Service: General;  Laterality: N/A;   VASCULAR SURGERY     VASECTOMY     VIDEO BRONCHOSCOPY WITH ENDOBRONCHIAL ULTRASOUND Right 02/23/2022   Procedure: VIDEO BRONCHOSCOPY WITH ENDOBRONCHIAL ULTRASOUND;  Surgeon: Collene Gobble, MD;  Location: [facility] ENDOSCOPY;  Service: Pulmonary;  Laterality: Right;   VIDEO BRONCHOSCOPY WITH RADIAL ENDOBRONCHIAL ULTRASOUND  02/23/2022   Procedure: VIDEO BRONCHOSCOPY WITH RADIAL ENDOBRONCHIAL ULTRASOUND;  Surgeon: Collene Gobble, MD;  Location: [facility] ENDOSCOPY;  Service: Pulmonary;;    REVIEW OF SYSTEMS:  Constitutional: positive for fatigue Eyes: negative Ears, nose, mouth, throat, and face: negative Respiratory: negative Cardiovascular: negative Gastrointestinal: negative Genitourinary:negative Integument/breast: negative Hematologic/lymphatic: negative Musculoskeletal:positive for arthralgias Neurological: negative Behavioral/Psych: negative Endocrine: negative Allergic/Immunologic:  negative   PHYSICAL EXAMINATION: General appearance: alert, cooperative, fatigued, and no distress Head: Normocephalic, without obvious abnormality, atraumatic Neck: no adenopathy, no JVD, supple, symmetrical, trachea midline, and thyroid not enlarged, symmetric, no tenderness/mass/nodules Lymph nodes: Cervical, supraclavicular, and axillary nodes normal. Resp: clear to auscultation bilaterally Back: symmetric, no curvature. ROM normal. No CVA tenderness. Cardio: regular rate and rhythm, S1, S2 normal, no murmur, click, rub or gallop GI: soft, non-tender; bowel sounds normal; no masses,  no organomegaly Extremities: extremities normal, atraumatic, no cyanosis or edema Neurologic: Alert and oriented X 3, normal strength and tone. Normal symmetric reflexes. Normal coordination and gait  ECOG PERFORMANCE STATUS: 1 - Symptomatic but completely ambulatory  Blood pressure (!) 151/77, pulse 86, temperature 97.9 F (36.6 C), temperature source Oral, resp. rate 16, height _0  (1.727 m), weight 151 lb 8 oz (68.7 kg), SpO2 100 %.  LABORATORY DATA: Lab Results  Component Value Date   WBC 6.7 06/03/2022   HGB 11.5 (L) 06/03/2022   HCT 33.0 (L) 06/03/2022   MCV 92.2 06/03/2022   PLT 259 06/03/2022      Chemistry      Component Value Date/Time   NA 133 (L) 05/30/2022 2253   NA 138 02/03/2021 0944   K 4.5 05/30/2022 2253   CL 100 05/30/2022 2253   CO2 24 05/30/2022 2253   BUN 21 05/30/2022 2253   BUN 13 02/03/2021 0944   CREATININE 0.88 05/30/2022 2253   CREATININE 1.01 05/26/2022 1326      Component Value Date/Time   CALCIUM 9.4 05/30/2022 2253   ALKPHOS 51 05/30/2022 2253   AST 21 05/30/2022 2253   AST 15 05/26/2022 1326   ALT 29 05/30/2022 2253   ALT 23 05/26/2022 1326   BILITOT 0.9 05/30/2022 2253   BILITOT 0.5 05/26/2022 1326       RADIOGRAPHIC STUDIES: CT CHEST ABDOMEN PELVIS W CONTRAST  Result Date: 05/27/2022 CLINICAL DATA:  Small-cell lung cancer, weakness *  Tracking Code: BO * EXAM: CT CHEST, ABDOMEN, AND PELVIS WITH CONTRAST TECHNIQUE: Multidetector CT imaging of the chest, abdomen and pelvis was performed following the standard protocol during bolus administration of intravenous contrast. RADIATION DOSE REDUCTION: This exam was performed according to the departmental dose-optimization program which includes automated exposure control, adjustment of the mA and/or kV according to patient size and/or use of iterative reconstruction technique. CONTRAST:  172m OMNIPAQUE IOHEXOL 300 MG/ML SOLN additional oral enteric contrast COMPARISON:  04/19/2022 FINDINGS: CT CHEST FINDINGS Cardiovascular: Right chest  port catheter. Aortic atherosclerosis. Normal heart size. Three-vessel coronary artery calcifications. No pericardial effusion. Mediastinum/Nodes: Unchanged enlarged pretracheal, subcarinal, and right hilar lymph nodes, largest subcarinal node measuring 2.0 x 1.8 cm (series 2, image 24), largest right hilar node measuring 2.1 x 2.1 cm (series 2, image 37). Thyroid gland, trachea, and esophagus demonstrate no significant findings. Lungs/Pleura: Moderate centrilobular and paraseptal emphysema. Stable or slightly diminished 0.5 cm nodule of the peripheral right lower lobe (series 4, image 94). Slightly diminished nodule located inferiorly in the right lower lobe measuring 1.6 x 0.5 cm, previously 1.8 x 0.7 cm (series 4, image 23). Unchanged scarring and volume loss of the medial segment right middle lobe and lingula (series 4, image 117). No pleural effusion or pneumothorax. Musculoskeletal: No chest wall abnormality. No acute osseous findings. CT ABDOMEN PELVIS FINDINGS Hepatobiliary: No solid liver abnormality is seen. No gallstones, gallbladder wall thickening, or biliary dilatation. Pancreas: Unremarkable. No pancreatic ductal dilatation or surrounding inflammatory changes. Spleen: Normal in size without significant abnormality. Adrenals/Urinary Tract: Adrenal glands are  unremarkable. Kidneys are normal, without renal calculi, solid lesion, or hydronephrosis. Bladder is unremarkable. Stomach/Bowel: Stomach is within normal limits. Appendix appears normal. No evidence of bowel wall thickening, distention, or inflammatory changes. Descending and sigmoid diverticulosis. Moderate burden of stool throughout the colon and rectum. Vascular/Lymphatic: Abdominal aortic aneurysm status post aortobiiliac stent endograft repair with snorkel stenting of the mesenteric branch vessels. No enlarged abdominal or pelvic lymph nodes. Reproductive: No mass or other abnormality. Other: No abdominal wall hernia or abnormality. No ascites. Musculoskeletal: No acute osseous findings. Unchanged faint sclerosis of the right aspect of the L4 vertebral body (series 2, image 85) and of the right iliac wing (series 2, image 98). IMPRESSION: 1. No acute findings of the chest, abdomen, or pelvis. 2. Stable or slightly diminished pulmonary nodules of the right lower lobe. 3. Unchanged enlarged pretracheal, subcarinal, and right hilar lymph nodes. 4. Unchanged faintly sclerotic treated osseous metastases of the L4 vertebral bodies and right ilium. 5. No evidence of new metastatic disease in the chest, abdomen, or pelvis. 6. Emphysema. 7. Coronary artery disease. 8. Abdominal aortic aneurysm status post aortobiiliac stent endograft repair with snorkel stenting of the mesenteric branch vessels. Aortic Atherosclerosis (ICD10-I70.0) and Emphysema (ICD10-J43.9). Electronically Signed   By: Delanna Ahmadi M.D.   On: 05/27/2022 15:48   DG Chest Portable 1 View  Result Date: 05/27/2022 CLINICAL DATA:  DKA EXAM: PORTABLE CHEST 1 VIEW COMPARISON:  Chest radiograph 02/23/2022 FINDINGS: Needle accessed right chest port in place with tip at the cavoatrial junction. Aortic vascular stent in place. No pleural effusion. No pneumothorax. Unchanged cardiac and mediastinal contours. Left basilar atelectasis. Previously seen  nodules in the right lung base are not definitively visualized on this exam. No displaced rib fractures. IMPRESSION: 1. Right chest port in place with tip at the cavoatrial junction. No pneumothorax. 2. Left basilar atelectasis. Known lung nodules at the right lung base are not definitively visualized on this exam. Recommend CT chest for more definitive characterizatio, if clinically indicated. Electronically Signed   By: Marin Roberts M.D.   On: 05/27/2022 11:27    ASSESSMENT AND PLAN: This is a very pleasant 76 years old white male with extensive stage (T1c, N2, M1C) small cell lung cancer presented with right lower lobe lung nodule in addition to right hilar and mediastinal lymphadenopathy and bone metastasis involving thoracic spines as well as the right iliac crest diagnosed in July 2023. The patient started systemic chemotherapy with  carboplatin for AUC of 5 on day 1, etoposide 100 Mg/M2 on days 1, 2 and 3 as well as Cosela 240 Mg/M2 on the days of the chemotherapy and Imfinzi 1500 Mg IV every 3 weeks with the induction treatment.  He is status post 4 cycles.  Starting from cycle #5 the patient will be on maintenance treatment with Imfinzi 1500 Mg IV every 4 weeks. He had repeat CT scan of the chest, abdomen and pelvis performed recently.  I personally and independently reviewed the scan and discussed the result with the patient today. His scan showed stable disease with no concerning findings for metastasis. I recommended for the patient to continue his current treatment with maintenance Imfinzi every 4 weeks and he will proceed with cycle #5 today. For the immunotherapy mediated type 1 diabetes mellitus, he will continue his current treatment with insulin and he is supposed to see Dr. Kelton Pillar on June 09, 2022 for further management of his condition. The patient will come back for follow-up visit in 4 weeks for evaluation before starting cycle #6. He was advised to call immediately if he has  any other concerning symptoms in the interval.  The patient voices understanding of current disease status and treatment options and is in agreement with the current care plan.  All questions were answered. The patient knows to call the clinic with any problems, questions or concerns. We can certainly see the patient much sooner if necessary.  The total time spent in the appointment was 30 minutes.  Disclaimer: This note was dictated with voice recognition software. Similar sounding words can inadvertently be transcribed and may not be corrected upon review.

## 2022-06-07 NOTE — Progress Notes (Unsigned)
Name: Adam Jordan  MRN/ DOB: [id number], 08-25-1945   Age/ Sex: 76 y.o., male    PCP: [name], DO   Reason for Endocrinology Evaluation:  Diabetes Mellitus     Date of Initial Endocrinology Visit: 06/09/2022     PATIENT IDENTIFIER: [name] is a 76 y.o. male with a past medical history of DM, COPD, HTN , Dyslipidemia , small lung ca. (Dx 01/2022). The patient presented for initial endocrinology clinic visit on 06/09/2022 for consultative assistance with his diabetes management.    HPI: Adam Jordan was    Diagnosed with DM 05/2022 Patient was diagnosed with immune mediated diabetes mellitus through oncology while being treated for small cell lung cancer while on chemotherapy Prior Medications tried/Intolerance: as listed  Currently checking blood sugars  Hypoglycemia episodes : no                Hemoglobin A1c 10.1%  in 05/2022. Patient required assistance for hypoglycemia: no Patient has required hospitalization within the last 1 year from hyper or hypoglycemia: 05/2022 hyperglycemia   In terms of diet, the patient eats 2 meals day  Denies nausea, vomiting or diarrhea   Has tinging of the feet   HOME DIABETES REGIMEN: Lantus 12 units daily       Statin: yes ACE-I/ARB: No - anaphylactic to Losartan Prior Diabetic Education: no   METER DOWNLOAD SUMMARY: just started using it last night   DIABETIC COMPLICATIONS: Microvascular complications:  He has chronic neuropathy prior to diagnosis of diabetes Denies: CKD  Last eye exam:  Macrovascular complications:   Denies: CAD, PVD, CVA   PAST HISTORY: Past Medical History:  Past Medical History:  Diagnosis Date   Anxiety    Arthritis    osteoarthrititis- knees and most joints.   COPD (chronic obstructive pulmonary disease) (HCC)    moderate -no regular use of inhalers- rare use of oxygen as sexual activity   Dyspnea    outside in hot weather and also with pollen   Hyperlipidemia    Hypertension     Neuromuscular disorder (HCC)    feet   Paroxysmal atrial flutter ([place]) 01/15/2021   Thoracoabdominal aneurysm ([place])    s/p FEVAR 4 Vessel TABME 02/19/20 Dr. Katy Apo   Past Surgical History:  Past Surgical History:  Procedure Laterality Date   ABDOMINAL AORTIC ANEURYSM REPAIR  02/06/2020   BRONCHIAL BIOPSY  02/23/2022   Procedure: BRONCHIAL BIOPSIES;  Surgeon: Collene Gobble, MD;  Location[facility] ENDOSCOPY;  Service: Pulmonary;;   BRONCHIAL BRUSHINGS  02/23/2022   Procedure: BRONCHIAL BRUSHINGS;  Surgeon: Collene Gobble, MD;  Location: [facility] ENDOSCOPY;  Service: Pulmonary;;   BRONCHIAL NEEDLE ASPIRATION BIOPSY  02/23/2022   Procedure: BRONCHIAL NEEDLE ASPIRATION BIOPSIES;  Surgeon: Collene Gobble, MD;  Location: [place] ENDOSCOPY;  Service: Pulmonary;;   BROW LIFT Bilateral 12/11/2021   Procedure: UPPER LID BLEPHAROPLASTY;  Surgeon: Irene Limbo, MD;  Location: [place];  Service: Plastics;  Laterality: Bilateral;   CYST REMOVAL LEG Left 07/03/2021   Procedure: EXCISION CYST LEFT BUTTOCK;  Surgeon: Jovita Kussmaul, MD;  Location: [place];  Service: General;  Laterality: Left;   EYE SURGERY Bilateral    cataract surgery   FINGER ARTHROPLASTY Left    left thumb-Dr. Amedeo Plenty   INGUINAL HERNIA REPAIR Left 07/03/2021   Procedure: LEFT INGUINAL HERNIA REPAIR WITH MESH;  Surgeon: Jovita Kussmaul, MD;  Location: [place];  Service: General;  Laterality: Left;   INSERTION OF MESH  N/A 07/03/2021   Procedure: INSERTION OF MESH X2;  Surgeon: Jovita Kussmaul, MD;  Location: [place];  Service: General;  Laterality: N/A;   IR IMAGING GUIDED PORT INSERTION  03/12/2022   IR RADIOLOGIST EVAL & MGMT  10/25/2019   KNEE ARTHROSCOPY Left    KNEE SURGERY Left    Bakers cyst x2   SHOULDER ARTHROSCOPY Right    thumb surgery     TONSILLECTOMY     TOTAL KNEE ARTHROPLASTY Left 03/19/2016   Procedure: LEFT TOTAL KNEE ARTHROPLASTY;  Surgeon: Sydnee Cabal, MD;  Location: [facility];  Service: Orthopedics;   Laterality: Left;   TOTAL SHOULDER REPLACEMENT Right    UMBILICAL HERNIA REPAIR N/A 07/03/2021   Procedure: UMBILICAL HERNIA REPAIR WITH MESH;  Surgeon: Jovita Kussmaul, MD;  Location: [place];  Service: General;  Laterality: N/A;   VASCULAR SURGERY     VASECTOMY     VIDEO BRONCHOSCOPY WITH ENDOBRONCHIAL ULTRASOUND Right 02/23/2022   Procedure: VIDEO BRONCHOSCOPY WITH ENDOBRONCHIAL ULTRASOUND;  Surgeon: Collene Gobble, MD;  Location: [facility] ENDOSCOPY;  Service: Pulmonary;  Laterality: Right;   VIDEO BRONCHOSCOPY WITH RADIAL ENDOBRONCHIAL ULTRASOUND  02/23/2022   Procedure: VIDEO BRONCHOSCOPY WITH RADIAL ENDOBRONCHIAL ULTRASOUND;  Surgeon: Collene Gobble, MD;  Location: [place] ENDOSCOPY;  Service: Pulmonary;;    Social History:  reports that he quit smoking about 3 years ago. His smoking use included cigarettes. He has never used smokeless tobacco. He reports that he does not currently use alcohol. He reports that he does not use drugs. Family History:  Family History  Problem Relation Age of Onset   Ovarian cancer Mother    Breast cancer Mother    Macular degeneration Mother    Heart attack Father      HOME MEDICATIONS: Allergies as of 06/09/2022       Reactions   Losartan Anaphylaxis   Atorvastatin Calcium    Causes agitation at 40 mg, tolerates 20 mg   Pollen Extract-tree Extract [pollen Extract]         Medication List        Accurate as of June 09, 2022 12:07 PM. If you have any questions, ask your nurse or doctor.          acetaminophen 500 MG tablet Commonly known as: TYLENOL Take 500 mg by mouth daily as needed for moderate pain or headache.   apixaban 5 MG Tabs tablet Commonly known as: ELIQUIS Take 1 tablet (5 mg total) by mouth 2 (two) times daily. Okay to restart this medication on 02/24/2022   atorvastatin 20 MG tablet Commonly known as: LIPITOR Take 20 mg by mouth at bedtime.   blood glucose meter kit and supplies Dispense based on patient and insurance  preference. Use up to four times daily as directed. (FOR ICD-10 E10.9, E11.9).   CLEAR EYES OP Place 1 drop into both eyes daily.   clonazePAM 1 MG tablet Commonly known as: KLONOPIN Take 1 tablet (1 mg total) by mouth 2 (two) times daily as needed for anxiety.   fluticasone 50 MCG/ACT nasal spray Commonly known as: FLONASE Place 1 spray into both nostrils 2 (two) times daily as needed for allergies.   Gaviscon 80-14.2 MG Chew Generic drug: Alum Hydroxide-Mag Trisilicate Chew 1 tablet by mouth daily as needed (heartburn).   insulin glargine 100 UNIT/ML Solostar Pen Commonly known as: LANTUS Inject 12 Units into the skin at bedtime.   Insulin Pen Needle 32G X 4 MM Misc 12 Units by Does not  apply route at bedtime.   lidocaine-prilocaine cream Commonly known as: EMLA Apply to the Port-A-Cath site 30-60 minutes before chemotherapy   meloxicam 15 MG tablet Commonly known as: MOBIC Take 15 mg by mouth daily as needed for pain.   multivitamin with minerals Tabs tablet Take 1 tablet by mouth daily.   OneTouch Delica Plus [id number] Misc Apply topically 4 (four) times daily.   OneTouch Ultra test strip Generic drug: glucose blood 4 (four) times daily. for testing   ProAir HFA 108 (90 Base) MCG/ACT inhaler Generic drug: albuterol Inhale 2 puffs into the lungs every 6 (six) hours as needed for wheezing or shortness of breath.   prochlorperazine 10 MG tablet Commonly known as: COMPAZINE Take 1 tablet (10 mg total) by mouth every 6 (six) hours as needed for nausea or vomiting.   sildenafil 20 MG tablet Commonly known as: REVATIO Take 60 mg by mouth daily as needed (ED).   Stiolto Respimat 2.5-2.5 MCG/ACT Aers Generic drug: Tiotropium Bromide-Olodaterol Inhale 2 puffs into the lungs daily.   traZODone 50 MG tablet Commonly known as: DESYREL Take 50 mg by mouth at bedtime.   triamcinolone cream 0.1 % Commonly known as: KENALOG Apply 1 Application topically daily.          ALLERGIES: Allergies  Allergen Reactions   Losartan Anaphylaxis   Atorvastatin Calcium     Causes agitation at 40 mg, tolerates 20 mg   Pollen Extract-Tree Extract [Pollen Extract]      REVIEW OF SYSTEMS: A comprehensive ROS was conducted with the patient and is negative except as per HPI     OBJECTIVE:   VITAL SIGNS: BP 122/62   Pulse 78   Ht _0  (1.727 m)   Wt 155 lb 12.8 oz (70.7 kg)   SpO2 94%   BMI 23.69 kg/m    PHYSICAL EXAM:  General: Pt appears well and is in NAD  Neck: General: Supple without adenopathy or carotid bruits. Thyroid: Thyroid size normal.  No goiter or nodules appreciated.   Lungs: Clear with good BS bilat with no rales, rhonchi, or wheezes  Heart: RRR   Abdomen:  soft, nontender  Extremities:  Lower extremities - No pretibial edema. No lesions.  Neuro: MS is good with appropriate affect, pt is alert and Ox3    DATA REVIEWED:  Lab Results  Component Value Date   HGBA1C 10.1 (H) 05/27/2022   Lab Results  Component Value Date   CREATININE 0.84 06/03/2022   In office BG 364 mg/dL    ASSESSMENT / PLAN / RECOMMENDATIONS:   1) Type 1 Diabetes Mellitus, Newly Diagnosed - Most recent A1c of 10.1 %. Goal A1c < 7.0 %.     -Patient has been newly diagnosed with type 1 diabetes due to immunologic chemotherapeutic agents -He is currently on basal insulin only, his in office BG was 364 mg/DL Discussed pharmacokinetics of basal/bolus insulin and the importance of taking prandial insulin with meals.  We also discussed avoiding sugar-sweetened beverages and snacks, when possible.  -I have prescribed Dexcom G7, he was also provided with a sample sensor to try it on -I have recommended starting him on prandial dose of insulin to be used before each meal -He will also be provided with a correction scale as below -He has also been referred to our CDE for education regarding low-carb diet as well as Dexcom training if needed by  then  MEDICATIONS: Increase Lantus 14 units daily Start Humalog 4 units 3 times daily  before every meal Start correction scale : Humalog (BG 90-130/50)  EDUCATION / INSTRUCTIONS: BG monitoring instructions: Patient is instructed to check his blood sugars 3 times a day, before each meal. Call [facility] if: BG persistently < 70  I reviewed the Rule of 15 for the treatment of hypoglycemia in detail with the patient. Literature supplied.   2) Diabetic complications:  Eye: Does not have known diabetic retinopathy.  Neuro/ Feet: Does  have known peripheral neuropathy, prior to his diagnosis of diabetes Renal: Patient does not have known baseline CKD. He is not on an ACEI/ARB at present.    F/U in 4 months   Signed electronically by: Mack Guise, MD  [facility] Endocrinology  [facility] [address] [place], [place] [address] Phone: [phone] FAX: [phone]   CC: [name], [address] [place] [address] Phone: [phone]  Fax: [phone]    Return to Endocrinology clinic as below: Future Appointments  Date Time Provider [place]  06/09/2022 12:10 PM Camia Dipinto, Melanie Crazier, MD [facility] None  06/30/2022  9:30 AM [facility] [place] FLUSH [facility] None  06/30/2022 10:00 AM Heilingoetter, Cassandra L, PA-C [facility] None  06/30/2022 11:15 AM [facility] INFUSION [facility] None

## 2022-06-09 ENCOUNTER — Ambulatory Visit (INDEPENDENT_AMBULATORY_CARE_PROVIDER_SITE_OTHER): Payer: Medicare HMO | Admitting: Internal Medicine

## 2022-06-09 ENCOUNTER — Encounter: Payer: Self-pay | Admitting: Internal Medicine

## 2022-06-09 ENCOUNTER — Other Ambulatory Visit: Payer: Self-pay | Admitting: Internal Medicine

## 2022-06-09 VITALS — BP 122/62 | HR 78 | Ht 68.0 in | Wt 155.8 lb

## 2022-06-09 DIAGNOSIS — E109 Type 1 diabetes mellitus without complications: Secondary | ICD-10-CM

## 2022-06-09 DIAGNOSIS — E1042 Type 1 diabetes mellitus with diabetic polyneuropathy: Secondary | ICD-10-CM

## 2022-06-09 HISTORY — DX: Type 1 diabetes mellitus without complications: E10.9

## 2022-06-09 HISTORY — DX: Type 1 diabetes mellitus with diabetic polyneuropathy: E10.42

## 2022-06-09 LAB — POCT GLUCOSE (DEVICE FOR HOME USE): POC Glucose: 364 mg/dl — AB (ref 70–99)

## 2022-06-09 MED ORDER — DEXCOM G7 SENSOR MISC: 1.0000 | 3 refills | Status: DC

## 2022-06-09 MED ORDER — ONETOUCH ULTRA VI STRP: 1.0000 | ORAL_STRIP | Freq: Three times a day (TID) | 3 refills | Status: DC

## 2022-06-09 MED ORDER — INSULIN LISPRO (1 UNIT DIAL) 100 UNIT/ML (KWIKPEN): PEN_INJECTOR | SUBCUTANEOUS | 11 refills | Status: DC

## 2022-06-09 MED ORDER — INSULIN GLARGINE 100 UNIT/ML SOLOSTAR PEN: 14.0000 [IU] | PEN_INJECTOR | Freq: Every day | SUBCUTANEOUS | 11 refills | Status: DC

## 2022-06-09 MED ORDER — NOVOLOG FLEXPEN 100 UNIT/ML ~~LOC~~ SOPN: PEN_INJECTOR | SUBCUTANEOUS | 11 refills | Status: DC

## 2022-06-09 MED ORDER — INSULIN PEN NEEDLE 32G X 4 MM MISC: 1.0000 | Freq: Four times a day (QID) | 3 refills | Status: DC

## 2022-06-09 NOTE — Patient Instructions (Addendum)
Increase Lantus 14 units daily  Start Humalog 4 units with each meal  Humalog correctional insulin: ADD extra units on insulin to your meal-time Humalog dose if your blood sugars are higher than 180. Use the scale below to help guide you:   Blood sugar before meal Number of units to inject  Less than 180 0 unit  181 -  230 1 units  231 -  280 2 units  281 -  330 3 units  331 -  380 4 units  381 -  430 5 units  431 -  480 6 units     HOW TO TREAT LOW BLOOD SUGARS (Blood sugar LESS THAN 70 MG/DL) Please follow the RULE OF 15 for the treatment of hypoglycemia treatment (when your (blood sugars are less than 70 mg/dL)   STEP 1: Take 15 grams of carbohydrates when your blood sugar is low, which includes:  3-4 GLUCOSE TABS  OR 3-4 OZ OF JUICE OR REGULAR SODA OR ONE TUBE OF GLUCOSE GEL    STEP 2: RECHECK blood sugar in 15 MINUTES STEP 3: If your blood sugar is still low at the 15 minute recheck --> then, go back to STEP 1 and treat AGAIN with another 15 grams of carbohydrates.

## 2022-06-09 NOTE — Addendum Note (Signed)
Addended by: Dorita Sciara on: 06/09/2022 03:40 PM   Modules accepted: Orders

## 2022-06-10 ENCOUNTER — Other Ambulatory Visit: Payer: Self-pay

## 2022-06-10 ENCOUNTER — Telehealth: Payer: Self-pay | Admitting: *Deleted

## 2022-06-10 NOTE — Telephone Encounter (Signed)
Patient called stating that last time he was here he reported he had fell on his previously replaced knee.  Reports still having a lot of pain and knee is swollen.  He was told that Dr Julien Nordmann had mentioned possibly getting a xray of the knee.  He wants to see about getting that ordered and scheduled for 11/13 if at all possible.    Routed to Dr Julien Nordmann to please advise and order .

## 2022-06-11 ENCOUNTER — Telehealth: Payer: Self-pay | Admitting: *Deleted

## 2022-06-11 NOTE — Telephone Encounter (Signed)
LM with note below  He should reach out to his orthopedic provider who performed his knee replacement as they would know better how to manage it if abnormal.

## 2022-06-14 DIAGNOSIS — E109 Type 1 diabetes mellitus without complications: Secondary | ICD-10-CM | POA: Diagnosis not present

## 2022-06-15 DIAGNOSIS — M1712 Unilateral primary osteoarthritis, left knee: Secondary | ICD-10-CM | POA: Diagnosis not present

## 2022-06-15 DIAGNOSIS — M1711 Unilateral primary osteoarthritis, right knee: Secondary | ICD-10-CM | POA: Diagnosis not present

## 2022-06-15 DIAGNOSIS — M170 Bilateral primary osteoarthritis of knee: Secondary | ICD-10-CM | POA: Diagnosis not present

## 2022-06-21 ENCOUNTER — Other Ambulatory Visit: Payer: Self-pay

## 2022-06-21 ENCOUNTER — Telehealth: Payer: Self-pay | Admitting: Physician Assistant

## 2022-06-21 MED ORDER — INSULIN GLARGINE 100 UNIT/ML SOLOSTAR PEN: 14.0000 [IU] | PEN_INJECTOR | Freq: Every day | SUBCUTANEOUS | 11 refills | Status: DC

## 2022-06-21 NOTE — Telephone Encounter (Signed)
Called patient regarding upcoming November appointment, left a voicemail. 

## 2022-06-28 NOTE — Progress Notes (Unsigned)
[place] [facility] OFFICE PROGRESS NOTE  Adam Morning, DO [address] [place] [address]  DIAGNOSIS:   1) Extensive stage (T1c, N2, M1C) small cell lung cancer presented with right lower lobe lung nodule in addition to right hilar and mediastinal lymphadenopathy and bone metastasis involving thoracic spines as well as the right iliac crest diagnosed in July 2023. 2) immunotherapy mediated type 1 diabetes mellitus diagnosed in October 2023.  PRIOR THERAPY: None  CURRENT THERAPY: Systemic chemotherapy with carboplatin for AUC of 5 on day 1, etoposide 100 Mg/M2 on days 1, 2 and 3 with Imfinzi 1500 Mg IV on day 1 and Cosela 240 Mg/M2 on the days of the chemotherapy as well as Imfinzi 1500 Mg IV on day 1 every 3 weeks the first 4 cycles followed by maintenance treatment every 4 weeks starting from cycle #5.  First dose was giving 03/10/2022.  The patient status post 5 cycles.  Starting from cycle #5 the patient will be on maintenance treatment with Imfinzi 1500 Mg IV every 4 weeks.   INTERVAL HISTORY: Adam Jordan 76 y.o. male returns to the clinic for a follow up visit. The patient is feeling well today without any concerning complaints. The patient continues to tolerate treatment with maintenance immunotherapy with Imfinzi well without any adverse effects except he unfortunately developed type 1 diabetes for which he follows with Dr. Kelton Jordan from endocrinology. He states he took 12 units of insulin before leaving today because his blood sugar was in the 300's this Jordan. He is having a hard time getting his blood sugars under control. He also would be interested in diabetic education. Denies any fever, chills, night sweats. He lost a few pounds since last being seen. Denies any chest pain, shortness of breath, cough, or hemoptysis. Denies any nausea, vomiting, diarrhea, or constipation. Denies any headache or visual changes. Denies any rashes or skin changes. He does  report some mild sinus issues with the weather changes. The patient is here today for evaluation prior to starting cycle # 6   MEDICAL HISTORY: Past Medical History:  Diagnosis Date   Anxiety    Arthritis    osteoarthrititis- knees and most joints.   COPD (chronic obstructive pulmonary disease) (HCC)    moderate -no regular use of inhalers- rare use of oxygen as sexual activity   Dyspnea    outside in hot weather and also with pollen   Hyperlipidemia    Hypertension    Neuromuscular disorder ([place] [facility])    feet   Paroxysmal atrial flutter ([place]) 01/15/2021   Thoracoabdominal aneurysm (HCC)    s/p FEVAR 4 Vessel TABME 02/19/20 Dr. Katy Apo    ALLERGIES:  is allergic to losartan, atorvastatin calcium, and pollen extract-tree extract [pollen extract].  MEDICATIONS:  Current Outpatient Medications  Medication Sig Dispense Refill   Alum Hydroxide-Mag Trisilicate (GAVISCON) 78-67.6 MG CHEW Chew 1 tablet by mouth daily as needed (heartburn).     apixaban (ELIQUIS) 5 MG TABS tablet Take 1 tablet (5 mg total) by mouth 2 (two) times daily. Okay to restart this medication on 02/24/2022 180 tablet 3   atorvastatin (LIPITOR) 20 MG tablet Take 20 mg by mouth at bedtime.     blood glucose meter kit and supplies Dispense based on patient and insurance preference. Use up to four times daily as directed. (FOR ICD-10 E10.9, E11.9). 1 each 0   clonazePAM (KLONOPIN) 1 MG tablet Take 1 tablet (1 mg total) by mouth 2 (two) times daily  as needed for anxiety. 60 tablet 5   COMIRNATY SUSP injection      Continuous Blood Gluc Sensor (DEXCOM G7 SENSOR) MISC 1 Device by Does not apply route as directed. 9 each 3   fluticasone (FLONASE) 50 MCG/ACT nasal spray Place 1 spray into both nostrils 2 (two) times daily as needed for allergies.     insulin aspart (NOVOLOG FLEXPEN) 100 UNIT/ML FlexPen Max daily 30 units 15 mL 11   insulin glargine (LANTUS) 100 UNIT/ML Solostar Pen Inject 14 Units into the skin daily. 15 mL 11    Insulin Pen Needle 32G X 4 MM MISC 1 Device by Does not apply route in the Jordan, at noon, in the evening, and at bedtime. 400 each 3   Lancets (ONETOUCH DELICA PLUS [id number]) MISC Apply topically 4 (four) times daily.     lidocaine-prilocaine (EMLA) cream Apply to the Port-A-Cath site 30-60 minutes before chemotherapy 30 g 0   meloxicam (MOBIC) 15 MG tablet Take 15 mg by mouth daily as needed for pain.     Multiple Vitamin (MULTIVITAMIN WITH MINERALS) TABS tablet Take 1 tablet by mouth daily. 30 tablet 5   Naphazoline HCl (CLEAR EYES OP) Place 1 drop into both eyes daily.     ONETOUCH ULTRA test strip 1 each by Other route 3 (three) times daily. for testing 300 each 3   PROAIR HFA 108 (90 BASE) MCG/ACT inhaler Inhale 2 puffs into the lungs every 6 (six) hours as needed for wheezing or shortness of breath.   2   sildenafil (REVATIO) 20 MG tablet Take 60 mg by mouth daily as needed (ED).     Tiotropium Bromide-Olodaterol (STIOLTO RESPIMAT) 2.5-2.5 MCG/ACT AERS Inhale 2 puffs into the lungs daily. 4 g 0   traZODone (DESYREL) 50 MG tablet Take 50 mg by mouth at bedtime.     triamcinolone cream (KENALOG) 0.1 % Apply 1 Application topically daily.     No current facility-administered medications for this visit.   Facility-Administered Medications Ordered in Other Visits  Medication Dose Route Frequency Provider Last Rate Last Admin   insulin aspart (novoLOG) injection 5 Units  5 Units Subcutaneous Once Adam Jordan L, PA-C        SURGICAL HISTORY:  Past Surgical History:  Procedure Laterality Date   ABDOMINAL AORTIC ANEURYSM REPAIR  02/06/2020   BRONCHIAL BIOPSY  02/23/2022   Procedure: BRONCHIAL BIOPSIES;  Surgeon: Collene Gobble, MD;  Location: [facility];  Service: Pulmonary;;   BRONCHIAL BRUSHINGS  02/23/2022   Procedure: BRONCHIAL BRUSHINGS;  Surgeon: Collene Gobble, MD;  Location: [facility]COPY;  Service: Pulmonary;;   BRONCHIAL NEEDLE ASPIRATION BIOPSY  02/23/2022    Procedure: BRONCHIAL NEEDLE ASPIRATION BIOPSIES;  Surgeon: Collene Gobble, MD;  Location: [facility];  Service: Pulmonary;;   BROW LIFT Bilateral 12/11/2021   Procedure: UPPER LID BLEPHAROPLASTY;  Surgeon: Irene Limbo, MD;  Location: [facility];  Service: Plastics;  Laterality: Bilateral;   CYST REMOVAL LEG Left 07/03/2021   Procedure: EXCISION CYST LEFT BUTTOCK;  Surgeon: Jovita Kussmaul, MD;  Location: [facility];  Service: General;  Laterality: Left;   EYE SURGERY Bilateral    cataract surgery   FINGER ARTHROPLASTY Left    left thumb-Dr. Amedeo Plenty   INGUINAL HERNIA REPAIR Left 07/03/2021   Procedure: LEFT INGUINAL HERNIA REPAIR WITH MESH;  Surgeon: Jovita Kussmaul, MD;  Location: [facility];  Service: General;  Laterality: Left;   INSERTION OF MESH N/A 07/03/2021   Procedure: INSERTION  OF MESH X2;  Surgeon: Jovita Kussmaul, MD;  Location: [facility];  Service: General;  Laterality: N/A;   IR IMAGING GUIDED PORT INSERTION  03/12/2022   IR RADIOLOGIST EVAL & MGMT  10/25/2019   KNEE ARTHROSCOPY Left    KNEE SURGERY Left    Bakers cyst x2   SHOULDER ARTHROSCOPY Right    thumb surgery     TONSILLECTOMY     TOTAL KNEE ARTHROPLASTY Left 03/19/2016   Procedure: LEFT TOTAL KNEE ARTHROPLASTY;  Surgeon: Sydnee Cabal, MD;  Location: [facility];  Service: Orthopedics;  Laterality: Left;   TOTAL SHOULDER REPLACEMENT Right    UMBILICAL HERNIA REPAIR N/A 07/03/2021   Procedure: UMBILICAL HERNIA REPAIR WITH MESH;  Surgeon: Jovita Kussmaul, MD;  Location: [facility];  Service: General;  Laterality: N/A;   VASCULAR SURGERY     VASECTOMY     VIDEO BRONCHOSCOPY WITH ENDOBRONCHIAL ULTRASOUND Right 02/23/2022   Procedure: VIDEO BRONCHOSCOPY WITH ENDOBRONCHIAL ULTRASOUND;  Surgeon: Collene Gobble, MD;  Location[facility];  Service: Pulmonary;  Laterality: Right;   VIDEO BRONCHOSCOPY WITH RADIAL ENDOBRONCHIAL ULTRASOUND  02/23/2022   Procedure: VIDEO BRONCHOSCOPY WITH RADIAL ENDOBRONCHIAL ULTRASOUND;  Surgeon:  Collene Gobble, MD;  Location: [facility];  Service: Pulmonary;;    REVIEW OF SYSTEMS:   Review of Systems  Constitutional: Negative for appetite change, chills, fatigue, fever and unexpected weight change.  HENT: Positive for mild sinus congestion. Negative for mouth sores, nosebleeds, sore throat and trouble swallowing.   Eyes: Negative for eye problems and icterus.  Respiratory: Negative for cough, hemoptysis, shortness of breath and wheezing.   Cardiovascular: Negative for chest pain and leg swelling.  Gastrointestinal: Negative for abdominal pain, constipation, diarrhea, nausea and vomiting.  Genitourinary: Negative for bladder incontinence, difficulty urinating, dysuria, frequency and hematuria.   Musculoskeletal: Negative for back pain, gait problem, neck pain and neck stiffness.  Skin: Negative for itching and rash.  Neurological: Negative for dizziness, extremity weakness, gait problem, headaches, light-headedness and seizures.  Hematological: Negative for adenopathy. Does not bruise/bleed easily.  Psychiatric/Behavioral: Negative for confusion, depression and sleep disturbance. The patient is not nervous/anxious.     PHYSICAL EXAMINATION:  Blood pressure 125/75, pulse 76, temperature 98.2 F (36.8 C), temperature source Oral, resp. rate 17, weight 151 lb 12.8 oz (68.9 kg), SpO2 97 %.  ECOG PERFORMANCE STATUS: 1  Physical Exam  Constitutional: Oriented to person, place, and time and well-developed, well-nourished, and in no distress.  HENT:  Head: Normocephalic and atraumatic.  Mouth/Throat: Oropharynx is clear and moist. No oropharyngeal exudate.  Eyes: Conjunctivae are normal. Right eye exhibits no discharge. Left eye exhibits no discharge. No scleral icterus.  Neck: Normal range of motion. Neck supple.  Cardiovascular: Normal rate, regular rhythm, normal heart sounds and intact distal pulses.   Pulmonary/Chest: Effort normal. Quiet breath sounds in all lung fields. No  respiratory distress. No wheezes. No rales.  Abdominal: Soft. Bowel sounds are normal. Exhibits no distension and no mass. There is no tenderness.  Musculoskeletal: Normal range of motion. Exhibits no edema.  Lymphadenopathy:    No cervical adenopathy.  Neurological: Alert and oriented to person, place, and time. Exhibits normal muscle tone. Gait normal. Coordination normal. Ambulates with a cane.  Skin: Skin is warm and dry. No rash noted. Not diaphoretic. No erythema. No pallor.  Psychiatric: Mood, memory and judgment normal.  Vitals reviewed.  LABORATORY DATA: Lab Results  Component Value Date   WBC 7.5 06/30/2022   HGB 12.7 (Jordan) 06/30/2022  HCT 37.8 (Jordan) 06/30/2022   MCV 95.2 06/30/2022   PLT 155 06/30/2022      Chemistry      Component Value Date/Time   NA 134 (Jordan) 06/30/2022 0941   NA 138 02/03/2021 0944   K 4.0 06/30/2022 0941   CL 100 06/30/2022 0941   CO2 26 06/30/2022 0941   BUN 20 06/30/2022 0941   BUN 13 02/03/2021 0944   CREATININE 0.85 06/30/2022 0941      Component Value Date/Time   CALCIUM 9.9 06/30/2022 0941   ALKPHOS 55 06/30/2022 0941   AST 15 06/30/2022 0941   ALT 16 06/30/2022 0941   BILITOT 0.7 06/30/2022 0941       RADIOGRAPHIC STUDIES:  No results found.   ASSESSMENT/PLAN:  This is a very pleasant 76 years old Caucasian male with extensive stage (T1c, N2, M1C) small cell lung cancer presented with right lower lobe lung nodule in addition to right hilar and mediastinal lymphadenopathy and bone metastasis involving thoracic spines as well as the right iliac crest diagnosed in July 2023.   The patient started systemic chemotherapy with carboplatin for AUC of 5 on day 1, etoposide 100 Mg/M2 on days 1, 2 and 3 as well as Cosela 240 Mg/M2 on the days of the chemotherapy and Imfinzi 1500 Mg IV every 3 weeks with the induction treatment.  He is status post 5 cycles.  Starting from cycle #5 the patient will be on maintenance treatment with Imfinzi 1500  Mg IV every 4 weeks.   Labs were reviewed. He took 12 units of insulin around 8 AM. His blood sugar is still 380. We will give 5 more units of insulin. The patient is interested in diabetic nutritional education. I asked him to reach out to his endocrinology practice to see if that is a service that is offered. His blood sugars have not been well controlled per patient report. I advised him to keep logs of his readings and reach out to his endocrinologist if he needs additional recommendations. Recommend that he proceed with cycle #6 today as scheduled.   I will arrange for a restaging CT scan prior to his next visit.   We will see him back for a follow up visit in 4 weeks for evaluation and to review his repeat scan before starting cycle #7  The patient was advised to call immediately if he has any concerning symptoms in the interval. The patient voices understanding of current disease status and treatment options and is in agreement with the current care plan. All questions were answered. The patient knows to call the clinic with any problems, questions or concerns. We can certainly see the patient much sooner if necessary         Orders Placed This Encounter  Procedures   CT Chest W Contrast    Standing Status:   Future    Standing Expiration Date:   06/30/2023    Order Specific Question:   If indicated for the ordered procedure, I authorize the administration of contrast media per Radiology protocol    Answer:   Yes    Order Specific Question:   Does the patient have a contrast media/X-ray dye allergy?    Answer:   No    Order Specific Question:   Preferred imaging location?    Answer:   [facility]   CT Abdomen Pelvis W Contrast    Standing Status:   Future    Standing Expiration Date:   06/30/2023  Order Specific Question:   If indicated for the ordered procedure, I authorize the administration of contrast media per Radiology protocol    Answer:   Yes    Order  Specific Question:   Does the patient have a contrast media/X-ray dye allergy?    Answer:   No    Order Specific Question:   Preferred imaging location?    Answer:   [facility]    Order Specific Question:   Is Oral Contrast requested for this exam?    Answer:   Yes, Per Radiology protocol   CBC with Differential ([facility] Only)    Standing Status:   Standing    Number of Occurrences:   10    Standing Expiration Date:   07/01/2023   CMP ([facility] only)    Standing Status:   Standing    Number of Occurrences:   10    Standing Expiration Date:   07/01/2023     The total time spent in the appointment was 20-29 minutes.   Ceola Para Jordan Bentley Haralson, PA-C 06/30/22

## 2022-06-29 ENCOUNTER — Other Ambulatory Visit: Payer: Self-pay

## 2022-06-29 DIAGNOSIS — C3431 Malignant neoplasm of lower lobe, right bronchus or lung: Secondary | ICD-10-CM

## 2022-06-30 ENCOUNTER — Telehealth: Payer: Self-pay

## 2022-06-30 ENCOUNTER — Inpatient Hospital Stay: Payer: Medicare HMO

## 2022-06-30 ENCOUNTER — Inpatient Hospital Stay (HOSPITAL_BASED_OUTPATIENT_CLINIC_OR_DEPARTMENT_OTHER): Payer: Medicare HMO | Admitting: Physician Assistant

## 2022-06-30 VITALS — BP 125/75 | HR 76 | Temp 98.2°F | Resp 17 | Wt 151.8 lb

## 2022-06-30 DIAGNOSIS — R739 Hyperglycemia, unspecified: Secondary | ICD-10-CM | POA: Diagnosis not present

## 2022-06-30 DIAGNOSIS — Z95828 Presence of other vascular implants and grafts: Secondary | ICD-10-CM

## 2022-06-30 DIAGNOSIS — E108 Type 1 diabetes mellitus with unspecified complications: Secondary | ICD-10-CM | POA: Diagnosis not present

## 2022-06-30 DIAGNOSIS — Z5112 Encounter for antineoplastic immunotherapy: Secondary | ICD-10-CM

## 2022-06-30 DIAGNOSIS — Z87891 Personal history of nicotine dependence: Secondary | ICD-10-CM | POA: Diagnosis not present

## 2022-06-30 DIAGNOSIS — C3431 Malignant neoplasm of lower lobe, right bronchus or lung: Secondary | ICD-10-CM

## 2022-06-30 DIAGNOSIS — Z79899 Other long term (current) drug therapy: Secondary | ICD-10-CM | POA: Diagnosis not present

## 2022-06-30 DIAGNOSIS — C7951 Secondary malignant neoplasm of bone: Secondary | ICD-10-CM | POA: Diagnosis not present

## 2022-06-30 LAB — CMP (CANCER CENTER ONLY)
ALT: 16 U/L (ref 0–44)
AST: 15 U/L (ref 15–41)
Albumin: 4.2 g/dL (ref 3.5–5.0)
Alkaline Phosphatase: 55 U/L (ref 38–126)
Anion gap: 8 (ref 5–15)
BUN: 20 mg/dL (ref 8–23)
CO2: 26 mmol/L (ref 22–32)
Calcium: 9.9 mg/dL (ref 8.9–10.3)
Chloride: 100 mmol/L (ref 98–111)
Creatinine: 0.85 mg/dL (ref 0.61–1.24)
GFR, Estimated: >60 mL/min (ref >60)
Glucose, Bld: 380 mg/dL — ABNORMAL HIGH (ref 70–99)
Potassium: 4 mmol/L (ref 3.5–5.1)
Sodium: 134 mmol/L — ABNORMAL LOW (ref 135–145)
Total Bilirubin: 0.7 mg/dL (ref 0.3–1.2)
Total Protein: 6.9 g/dL (ref 6.5–8.1)

## 2022-06-30 LAB — CBC WITH DIFFERENTIAL (CANCER CENTER ONLY)
Abs Immature Granulocytes: 0.01 10*3/uL (ref 0.00–0.07)
Basophils Absolute: 0.1 10*3/uL (ref 0.0–0.1)
Basophils Relative: 1 %
Eosinophils Absolute: 0.1 10*3/uL (ref 0.0–0.5)
Eosinophils Relative: 2 %
HCT: 37.8 % — ABNORMAL LOW (ref 39.0–52.0)
Hemoglobin: 12.7 g/dL — ABNORMAL LOW (ref 13.0–17.0)
Immature Granulocytes: 0 %
Lymphocytes Relative: 15 %
Lymphs Abs: 1.1 10*3/uL (ref 0.7–4.0)
MCH: 32 pg (ref 26.0–34.0)
MCHC: 33.6 g/dL (ref 30.0–36.0)
MCV: 95.2 fL (ref 80.0–100.0)
Monocytes Absolute: 0.6 10*3/uL (ref 0.1–1.0)
Monocytes Relative: 8 %
Neutro Abs: 5.6 10*3/uL (ref 1.7–7.7)
Neutrophils Relative %: 74 %
Platelet Count: 155 10*3/uL (ref 150–400)
RBC: 3.97 MIL/uL — ABNORMAL LOW (ref 4.22–5.81)
RDW: 13.6 % (ref 11.5–15.5)
WBC Count: 7.5 10*3/uL (ref 4.0–10.5)
nRBC: 0 % (ref 0.0–0.2)

## 2022-06-30 LAB — TSH: TSH: 0.989 u[IU]/mL (ref 0.350–4.500)

## 2022-06-30 MED ORDER — HEPARIN SOD (PORK) LOCK FLUSH 100 UNIT/ML IV SOLN
500.0000 [IU] | Freq: Once | INTRAVENOUS | Status: AC | PRN
  Administered 2022-06-30: 500 [IU]

## 2022-06-30 MED ORDER — SODIUM CHLORIDE 0.9% FLUSH
10.0000 mL | Freq: Once | INTRAVENOUS | Status: AC
  Administered 2022-06-30: 10 mL

## 2022-06-30 MED ORDER — SODIUM CHLORIDE 0.9% FLUSH
10.0000 mL | INTRAVENOUS | Status: DC | PRN
  Administered 2022-06-30: 10 mL

## 2022-06-30 MED ORDER — SODIUM CHLORIDE 0.9 % IV SOLN
1500.0000 mg | Freq: Once | INTRAVENOUS | Status: AC
  Administered 2022-06-30: 1500 mg via INTRAVENOUS
  Filled 2022-06-30: qty 30

## 2022-06-30 MED ORDER — INSULIN ASPART 100 UNIT/ML IJ SOLN
5.0000 [IU] | Freq: Once | INTRAMUSCULAR | Status: AC
  Administered 2022-06-30: 5 [IU] via SUBCUTANEOUS
  Filled 2022-06-30: qty 1

## 2022-06-30 MED ORDER — SODIUM CHLORIDE 0.9 % IV SOLN: Freq: Once | INTRAVENOUS | Status: AC

## 2022-06-30 NOTE — Telephone Encounter (Signed)
LMTCB and sent mychart message

## 2022-06-30 NOTE — Patient Instructions (Signed)
[facility]COL[facility]  Discharge Instructions: Thank you for choosing [facility] to provide your oncology and hematology care.   If you have a lab appointment with the [facility], please go directly to the [facility] and check in at the registration area.   Wear comfortable clothing and clothing appropriate for easy access to any Portacath or PICC line.   We strive to give you quality time with your provider. You may need to reschedule your appointment if you arrive late (15 or more minutes).  Arriving late affects you and other patients whose appointments are after yours.  Also, if you miss three or more appointments without notifying the office, you may be dismissed from the clinic at the provider's discretion.      For prescription refill requests, have your pharmacy contact our office and allow 72 hours for refills to be completed.    Today you received the following chemotherapy and/or immunotherapy agents imfinzi      To help prevent nausea and vomiting after your treatment, we encourage you to take your nausea medication as directed.  BELOW ARE SYMPTOMS THAT SHOULD BE REPORTED IMMEDIATELY: *FEVER GREATER THAN 100.4 F (38 C) OR HIGHER *CHILLS OR SWEATING *NAUSEA AND VOMITING THAT IS NOT CONTROLLED WITH YOUR NAUSEA MEDICATION *UNUSUAL SHORTNESS OF BREATH *UNUSUAL BRUISING OR BLEEDING *URINARY PROBLEMS (pain or burning when urinating, or frequent urination) *BOWEL PROBLEMS (unusual diarrhea, constipation, pain near the anus) TENDERNESS IN MOUTH AND THROAT WITH OR WITHOUT PRESENCE OF ULCERS (sore throat, sores in mouth, or a toothache) UNUSUAL RASH, SWELLING OR PAIN  UNUSUAL VAGINAL DISCHARGE OR ITCHING   Items with * indicate a potential emergency and should be followed up as soon as possible or go to the Emergency Department if any problems should occur.  Please show the CHEMOTHERAPY ALERT CARD or IMMUNOTHERAPY ALERT CARD at check-in to the  Emergency Department and triage nurse.  Should you have questions after your visit or need to cancel or reschedule your appointment, please contact [facility]  Dept: [phone]  and follow the prompts.  Office hours are 8:00 a.m. to 4:30 p.m. Monday - Friday. Please note that voicemails left after 4:00 p.m. may not be returned until the following business day.  We are closed weekends and major holidays. You have access to a nurse at all times for urgent questions. Please call the main number to the clinic Dept: [phone] and follow the prompts.   For any non-urgent questions, you may also contact your provider using MyChart. We now offer e-Visits for anyone 17 and older to request care online for non-urgent symptoms. For details visit [email or web address].   Also download the MyChart app! Go to the app store, search "MyChart", open the app, select [facility], and log in with your MyChart username and password.  Masks are optional in the cancer centers. If you would like for your care team to wear a mask while they are taking care of you, please let them know. You may have one support person who is at least 75 years old accompany you for your appointments.

## 2022-06-30 NOTE — Telephone Encounter (Signed)
Patient states that he was getting infusion around 1030am this morning and his BS was 380. Patient states that his BS have been running in the 320-380 range . Patient taking Lantus 12 units and Novolog 2 units with meals.  Patient was not able to give exact times but states whether he is fasting or not these are the ranges.

## 2022-07-01 DIAGNOSIS — E099 Drug or chemical induced diabetes mellitus without complications: Secondary | ICD-10-CM | POA: Diagnosis not present

## 2022-07-01 DIAGNOSIS — C3431 Malignant neoplasm of lower lobe, right bronchus or lung: Secondary | ICD-10-CM | POA: Diagnosis not present

## 2022-07-01 DIAGNOSIS — R531 Weakness: Secondary | ICD-10-CM | POA: Diagnosis not present

## 2022-07-01 DIAGNOSIS — M12811 Other specific arthropathies, not elsewhere classified, right shoulder: Secondary | ICD-10-CM | POA: Diagnosis not present

## 2022-07-01 DIAGNOSIS — M1990 Unspecified osteoarthritis, unspecified site: Secondary | ICD-10-CM | POA: Diagnosis not present

## 2022-07-01 NOTE — Telephone Encounter (Signed)
Patient replied to message through [facility] and verbalized understanding

## 2022-07-02 ENCOUNTER — Other Ambulatory Visit: Payer: Self-pay

## 2022-07-05 ENCOUNTER — Other Ambulatory Visit: Payer: Self-pay

## 2022-07-13 ENCOUNTER — Other Ambulatory Visit: Payer: Self-pay | Admitting: Internal Medicine

## 2022-07-13 DIAGNOSIS — C3431 Malignant neoplasm of lower lobe, right bronchus or lung: Secondary | ICD-10-CM

## 2022-07-14 DIAGNOSIS — E109 Type 1 diabetes mellitus without complications: Secondary | ICD-10-CM | POA: Diagnosis not present

## 2022-07-18 ENCOUNTER — Other Ambulatory Visit: Payer: Self-pay | Admitting: Internal Medicine

## 2022-07-20 ENCOUNTER — Other Ambulatory Visit: Payer: Self-pay | Admitting: Cardiology

## 2022-07-20 NOTE — Telephone Encounter (Signed)
Eliquis 5mg  refill request received. Patient is 76 years old, weight-68.9kg, Crea-0.85 on 06/30/2022, Diagnosis-Aflutter, and last seen by Dr. Drue Dun on 03/13/2021 & was due in 4 months from that visit. Dose is appropriate based on dosing criteria.  Pt needs an appt with Cardiologist. Message sent to schedulers.   Schedulers reached out to pt and had to leave a message on 07/20/2022

## 2022-07-21 ENCOUNTER — Telehealth: Payer: Self-pay | Admitting: Cardiology

## 2022-07-21 NOTE — Telephone Encounter (Signed)
*  STAT* If patient is at the pharmacy, call can be transferred to refill team.   1. Which medications need to be refilled? (please list name of each medication and dose if known) apixaban (ELIQUIS) 5 MG TABS tablet   2. Which pharmacy/location (including street and city if local pharmacy) is medication to be sent to? [facility] [place], [place] - [address]   3. Do they need a 30 day or 90 day supply? [place]

## 2022-07-21 NOTE — Telephone Encounter (Signed)
07/21/2022 called pt in reference to getting an appt. There was no answer therefore, left another message.

## 2022-07-21 NOTE — Telephone Encounter (Signed)
Pt is overdue to see Cardiologist. Left a message to call back to get scheduled. Please refer to Eliquis refill from yesterday.

## 2022-07-22 ENCOUNTER — Ambulatory Visit (HOSPITAL_COMMUNITY)
Admission: RE | Admit: 2022-07-22 | Discharge: 2022-07-22 | Disposition: A | Discharge status: Home / Self Care | Payer: Medicare HMO | Source: Ambulatory Visit | Attending: Physician Assistant | Admitting: Physician Assistant

## 2022-07-22 DIAGNOSIS — J439 Emphysema, unspecified: Secondary | ICD-10-CM | POA: Diagnosis not present

## 2022-07-22 DIAGNOSIS — R918 Other nonspecific abnormal finding of lung field: Secondary | ICD-10-CM | POA: Diagnosis not present

## 2022-07-22 DIAGNOSIS — C3431 Malignant neoplasm of lower lobe, right bronchus or lung: Secondary | ICD-10-CM | POA: Insufficient documentation

## 2022-07-22 MED ORDER — IOHEXOL 300 MG/ML  SOLN
100.0000 mL | Freq: Once | INTRAMUSCULAR | Status: AC | PRN
  Administered 2022-07-22: 100 mL via INTRAVENOUS

## 2022-07-22 MED ORDER — SODIUM CHLORIDE (PF) 0.9 % IJ SOLN
INTRAMUSCULAR | Status: AC
  Filled 2022-07-22: qty 50

## 2022-07-22 NOTE — Telephone Encounter (Signed)
Pt needs appt with cardiologist for refill.

## 2022-07-23 ENCOUNTER — Other Ambulatory Visit: Payer: Self-pay | Admitting: Cardiology

## 2022-07-23 ENCOUNTER — Telehealth: Payer: Self-pay | Admitting: Cardiology

## 2022-07-23 DIAGNOSIS — I4892 Unspecified atrial flutter: Secondary | ICD-10-CM

## 2022-07-23 MED ORDER — APIXABAN 5 MG PO TABS: ORAL_TABLET | ORAL | 0 refills | Status: DC

## 2022-07-23 NOTE — Telephone Encounter (Signed)
Eliquis 5mg  refill request received. Patient is 76 years old, weight-68.9kg, Crea-0.85 on 06/30/2022, Diagnosis-Aflutter, and last seen by Dr. Drue Dun on 03/13/2021, pt has a pending appt on 09/07/2022. Dose is appropriate based on dosing criteria. Will send in refill to requested pharmacy.

## 2022-07-23 NOTE — Telephone Encounter (Signed)
*  STAT* If patient is at the pharmacy, call can be transferred to refill team.   1. Which medications need to be refilled? (please list name of each medication and dose if known)   apixaban (ELIQUIS) 5 MG TABS tablet    2. Which pharmacy/location (including street and city if local pharmacy) is medication to be sent to? [facility], [place] - [address]   3. Do they need a 30 day or 90 day supply?  90 day   Pt has scheduled appt for 09/07/2022

## 2022-07-26 NOTE — Progress Notes (Deleted)
[place] OFFICE PROGRESS NOTE  [name], DO [address] [place] [address]  DIAGNOSIS: 1) Extensive stage (T1c, N2, M1C) small cell lung cancer presented with right lower lobe lung nodule in addition to right hilar and mediastinal lymphadenopathy and bone metastasis involving thoracic spines as well as the right iliac crest diagnosed in July 2023. 2) immunotherapy mediated type 1 diabetes mellitus diagnosed in October 2023.  PRIOR THERAPY: Systemic chemotherapy with carboplatin for AUC of 5 on day 1, etoposide 100 Mg/M2 on days 1, 2 and 3 with Imfinzi 1500 Mg IV on day 1 and Cosela 240 Mg/M2 on the days of the chemotherapy as well as Imfinzi 1500 Mg IV on day 1 every 3 weeks the first 4 cycles followed by maintenance treatment every 4 weeks starting from cycle #5.  First dose was giving 03/10/2022.  The patient status post 6 cycles.  Starting from cycle #5 the patient will be on maintenance treatment with Imfinzi 1500 Mg IV every 4 weeks. Discontinued due to disease progression ***  CURRENT THERAPY: Second lind chemotherapy with zepzelca ***mg IV every 3 weeks. First dose expected on ***  INTERVAL HISTORY: Adam Jordan 76 y.o. male returns  to the clinic for a follow up visit. The patient is feeling well today without any concerning complaints. The patient continues to tolerate treatment with maintenance immunotherapy with Imfinzi well without any adverse effects except he unfortunately developed type 1 diabetes for which he follows with Dr. Kelton Pillar from endocrinology.  They recently made adjustments to his regimen due to several episodes of hyperglycemia and his blood sugar has been *** since that time. Denies any fever, chills, night sweats. He lost a few pounds since last being seen. Denies any chest pain, shortness of breath, cough, or hemoptysis. Denies any nausea, vomiting, diarrhea, or constipation. Denies any headache or visual changes. Denies any  rashes or skin changes. The patient recently had a restaging CT scan. The patient is here today for evaluation and to review his scan results prior to starting cycle # 7  MEDICAL HISTORY: Past Medical History:  Diagnosis Date   Anxiety    Arthritis    osteoarthrititis- knees and most joints.   COPD (chronic obstructive pulmonary disease) (HCC)    moderate -no regular use of inhalers- rare use of oxygen as sexual activity   Dyspnea    outside in hot weather and also with pollen   Hyperlipidemia    Hypertension    Neuromuscular disorder ([place])    feet   Paroxysmal atrial flutter ([place]) 01/15/2021   Thoracoabdominal aneurysm (HCC)    s/p FEVAR 4 Vessel TABME 02/19/20 Dr. Katy Apo    ALLERGIES:  is allergic to losartan, atorvastatin calcium, and pollen extract-tree extract [pollen extract].  MEDICATIONS:  Current Outpatient Medications  Medication Sig Dispense Refill   Alum Hydroxide-Mag Trisilicate (GAVISCON) 61-44.3 MG CHEW Chew 1 tablet by mouth daily as needed (heartburn).     apixaban (ELIQUIS) 5 MG TABS tablet Please keep upcoming appt. 180 tablet 0   atorvastatin (LIPITOR) 20 MG tablet Take 20 mg by mouth at bedtime.     blood glucose meter kit and supplies Dispense based on patient and insurance preference. Use up to four times daily as directed. (FOR ICD-10 E10.9, E11.9). 1 each 0   clonazePAM (KLONOPIN) 1 MG tablet Take 1 tablet (1 mg total) by mouth 2 (two) times daily as needed for anxiety. 60 tablet 5   COMIRNATY SUSP injection  Continuous Blood Gluc Sensor (DEXCOM G7 SENSOR) MISC 1 Device by Does not apply route as directed. 9 each 3   fluticasone (FLONASE) 50 MCG/ACT nasal spray Place 1 spray into both nostrils 2 (two) times daily as needed for allergies.     insulin aspart (NOVOLOG FLEXPEN) 100 UNIT/ML FlexPen Max daily 30 units 15 mL 11   insulin glargine (LANTUS) 100 UNIT/ML Solostar Pen Inject 14 Units into the skin daily. 15 mL 11   Insulin Pen Needle 32G X 4 MM  MISC 1 Device by Does not apply route in the morning, at noon, in the evening, and at bedtime. 400 each 3   Lancets (ONETOUCH DELICA PLUS [id number]) MISC Apply topically 4 (four) times daily.     lidocaine-prilocaine (EMLA) cream Apply to the Port-A-Cath site 30-60 minutes before chemotherapy 30 g 0   meloxicam (MOBIC) 15 MG tablet Take 15 mg by mouth daily as needed for pain.     Multiple Vitamin (MULTIVITAMIN WITH MINERALS) TABS tablet Take 1 tablet by mouth daily. 30 tablet 5   Naphazoline HCl (CLEAR EYES OP) Place 1 drop into both eyes daily.     ONETOUCH ULTRA test strip 1 each by Other route 3 (three) times daily. for testing 300 each 3   PROAIR HFA 108 (90 BASE) MCG/ACT inhaler Inhale 2 puffs into the lungs every 6 (six) hours as needed for wheezing or shortness of breath.   2   sildenafil (REVATIO) 20 MG tablet Take 60 mg by mouth daily as needed (ED).     Tiotropium Bromide-Olodaterol (STIOLTO RESPIMAT) 2.5-2.5 MCG/ACT AERS Inhale 2 puffs into the lungs daily. 4 g 0   traZODone (DESYREL) 50 MG tablet Take 50 mg by mouth at bedtime.     triamcinolone cream (KENALOG) 0.1 % Apply 1 Application topically daily.     No current facility-administered medications for this visit.    SURGICAL HISTORY:  Past Surgical History:  Procedure Laterality Date   ABDOMINAL AORTIC ANEURYSM REPAIR  02/06/2020   BRONCHIAL BIOPSY  02/23/2022   Procedure: BRONCHIAL BIOPSIES;  Surgeon: Collene Gobble, MD;  Location: [facility] ENDOSCOPY;  Service: Pulmonary;;   BRONCHIAL BRUSHINGS  02/23/2022   Procedure: BRONCHIAL BRUSHINGS;  Surgeon: Collene Gobble, MD;  Location: [facility] ENDOSCOPY;  Service: Pulmonary;;   BRONCHIAL NEEDLE ASPIRATION BIOPSY  02/23/2022   Procedure: BRONCHIAL NEEDLE ASPIRATION BIOPSIES;  Surgeon: Collene Gobble, MD;  Location: [place] ENDOSCOPY;  Service: Pulmonary;;   BROW LIFT Bilateral 12/11/2021   Procedure: UPPER LID BLEPHAROPLASTY;  Surgeon: Irene Limbo, MD;  Location: [place];   Service: Plastics;  Laterality: Bilateral;   CYST REMOVAL LEG Left 07/03/2021   Procedure: EXCISION CYST LEFT BUTTOCK;  Surgeon: Jovita Kussmaul, MD;  Location: [place];  Service: General;  Laterality: Left;   EYE SURGERY Bilateral    cataract surgery   FINGER ARTHROPLASTY Left    left thumb-Dr. Amedeo Plenty   INGUINAL HERNIA REPAIR Left 07/03/2021   Procedure: LEFT INGUINAL HERNIA REPAIR WITH MESH;  Surgeon: Jovita Kussmaul, MD;  Location: [place];  Service: General;  Laterality: Left;   INSERTION OF MESH N/A 07/03/2021   Procedure: INSERTION OF MESH X2;  Surgeon: Jovita Kussmaul, MD;  Location: [place];  Service: General;  Laterality: N/A;   IR IMAGING GUIDED PORT INSERTION  03/12/2022   IR RADIOLOGIST EVAL & MGMT  10/25/2019   KNEE ARTHROSCOPY Left    KNEE SURGERY Left    Bakers cyst x2  SHOULDER ARTHROSCOPY Right    thumb surgery     TONSILLECTOMY     TOTAL KNEE ARTHROPLASTY Left 03/19/2016   Procedure: LEFT TOTAL KNEE ARTHROPLASTY;  Surgeon: Sydnee Cabal, MD;  Location: [facility];  Service: Orthopedics;  Laterality: Left;   TOTAL SHOULDER REPLACEMENT Right    UMBILICAL HERNIA REPAIR N/A 07/03/2021   Procedure: UMBILICAL HERNIA REPAIR WITH MESH;  Surgeon: Jovita Kussmaul, MD;  Location: [place];  Service: General;  Laterality: N/A;   VASCULAR SURGERY     VASECTOMY     VIDEO BRONCHOSCOPY WITH ENDOBRONCHIAL ULTRASOUND Right 02/23/2022   Procedure: VIDEO BRONCHOSCOPY WITH ENDOBRONCHIAL ULTRASOUND;  Surgeon: Collene Gobble, MD;  Location: [facility] ENDOSCOPY;  Service: Pulmonary;  Laterality: Right;   VIDEO BRONCHOSCOPY WITH RADIAL ENDOBRONCHIAL ULTRASOUND  02/23/2022   Procedure: VIDEO BRONCHOSCOPY WITH RADIAL ENDOBRONCHIAL ULTRASOUND;  Surgeon: Collene Gobble, MD;  Location: [facility] ENDOSCOPY;  Service: Pulmonary;;    REVIEW OF SYSTEMS:   Review of Systems  Constitutional: Negative for appetite change, chills, fatigue, fever and unexpected weight change.  HENT:   Negative for mouth sores, nosebleeds, sore throat  and trouble swallowing.   Eyes: Negative for eye problems and icterus.  Respiratory: Negative for cough, hemoptysis, shortness of breath and wheezing.   Cardiovascular: Negative for chest pain and leg swelling.  Gastrointestinal: Negative for abdominal pain, constipation, diarrhea, nausea and vomiting.  Genitourinary: Negative for bladder incontinence, difficulty urinating, dysuria, frequency and hematuria.   Musculoskeletal: Negative for back pain, gait problem, neck pain and neck stiffness.  Skin: Negative for itching and rash.  Neurological: Negative for dizziness, extremity weakness, gait problem, headaches, light-headedness and seizures.  Hematological: Negative for adenopathy. Does not bruise/bleed easily.  Psychiatric/Behavioral: Negative for confusion, depression and sleep disturbance. The patient is not nervous/anxious.     PHYSICAL EXAMINATION:  There were no vitals taken for this visit.  ECOG PERFORMANCE STATUS: {CHL ONC ECOG [id number]  Physical Exam  Constitutional: Oriented to person, place, and time and well-developed, well-nourished, and in no distress. No distress.  HENT:  Head: Normocephalic and atraumatic.  Mouth/Throat: Oropharynx is clear and moist. No oropharyngeal exudate.  Eyes: Conjunctivae are normal. Right eye exhibits no discharge. Left eye exhibits no discharge. No scleral icterus.  Neck: Normal range of motion. Neck supple.  Cardiovascular: Normal rate, regular rhythm, normal heart sounds and intact distal pulses.   Pulmonary/Chest: Effort normal and breath sounds normal. No respiratory distress. No wheezes. No rales.  Abdominal: Soft. Bowel sounds are normal. Exhibits no distension and no mass. There is no tenderness.  Musculoskeletal: Normal range of motion. Exhibits no edema.  Lymphadenopathy:    No cervical adenopathy.  Neurological: Alert and oriented to person, place, and time. Exhibits normal muscle tone. Gait normal. Coordination normal.   Skin: Skin is warm and dry. No rash noted. Not diaphoretic. No erythema. No pallor.  Psychiatric: Mood, memory and judgment normal.  Vitals reviewed.  LABORATORY DATA: Lab Results  Component Value Date   WBC 7.5 06/30/2022   HGB 12.7 (L) 06/30/2022   HCT 37.8 (L) 06/30/2022   MCV 95.2 06/30/2022   PLT 155 06/30/2022      Chemistry      Component Value Date/Time   NA 134 (L) 06/30/2022 0941   NA 138 02/03/2021 0944   K 4.0 06/30/2022 0941   CL 100 06/30/2022 0941   CO2 26 06/30/2022 0941   BUN 20 06/30/2022 0941   BUN 13 02/03/2021 0944   CREATININE 0.85 06/30/2022 0941  Component Value Date/Time   CALCIUM 9.9 06/30/2022 0941   ALKPHOS 55 06/30/2022 0941   AST 15 06/30/2022 0941   ALT 16 06/30/2022 0941   BILITOT 0.7 06/30/2022 0941       RADIOGRAPHIC STUDIES:  CT Chest W Contrast  Result Date: 07/25/2022 CLINICAL DATA:  Metastatic right lower lobe lung cancer restaging * Tracking Code: BO * EXAM: CT CHEST, ABDOMEN, AND PELVIS WITH CONTRAST TECHNIQUE: Multidetector CT imaging of the chest, abdomen and pelvis was performed following the standard protocol during bolus administration of intravenous contrast. RADIATION DOSE REDUCTION: This exam was performed according to the departmental dose-optimization program which includes automated exposure control, adjustment of the mA and/or kV according to patient size and/or use of iterative reconstruction technique. CONTRAST:  167m OMNIPAQUE IOHEXOL 300 MG/ML  SOLN COMPARISON:  05/27/2022 FINDINGS: CT CHEST FINDINGS Cardiovascular: Right chest port catheter. Aortic atherosclerosis. Normal heart size. Three-vessel coronary artery calcifications. No pericardial effusion. Mediastinum/Nodes: Interval enlargement of pretracheal, subcarinal, and right hilar lymph nodes, pretracheal nodes measuring 2.3 x 2.1 cm, previously 1.7 x 1.2 cm, subcarinal nodes measuring up to 2.9 x 2.4 cm, previously 2.0 x 1.8 cm (series 2, image 34). Thyroid  gland, trachea, and esophagus demonstrate no significant findings. Lungs/Pleura: Moderate to severe centrilobular and paraseptal emphysema. Slight interval enlargement of nodules of the peripheral right lower lobe, larger measuring 1.8 x 0.8 cm, previously 1.6 x 0.5 cm (series 5, image 107), and 0.8 x 0.7 cm, previously 0.5 cm (series 5, image 97). No pleural effusion or pneumothorax. Musculoskeletal: No chest wall abnormality. No acute osseous findings. Status post right shoulder arthroplasty. CT ABDOMEN PELVIS FINDINGS Hepatobiliary: No solid liver abnormality is seen. No gallstones, gallbladder wall thickening, or biliary dilatation. Pancreas: Unremarkable. No pancreatic ductal dilatation or surrounding inflammatory changes. Spleen: Normal in size without significant abnormality. Adrenals/Urinary Tract: Adrenal glands are unremarkable. Kidneys are normal, without renal calculi, solid lesion, or hydronephrosis. Bladder is unremarkable. Stomach/Bowel: Stomach is within normal limits. Appendix is not clearly visualized. No evidence of bowel wall thickening, distention, or inflammatory changes. Large burden of stool, predominantly in the right colon. Descending and sigmoid diverticulosis. Vascular/Lymphatic: Aortic atherosclerosis. Unchanged appearance of aortobiiliac stent endograft repair with snorkel stenting of the abdominal branch vessels. No enlarged abdominal or pelvic lymph nodes. Reproductive: No mass or other abnormality. Other: No abdominal wall hernia or abnormality. No ascites. Musculoskeletal: No acute osseous findings. Unchanged very subtle sclerotic osseous lesions of the right aspect L4 (series 6, image 121) and of the right iliac wing (series 2, image 99). IMPRESSION: 1. Interval enlargement of pretracheal, subcarinal, and right hilar lymph nodes, consistent with worsened nodal metastatic disease. 2. Slight interval enlargement of nodules of the peripheral right lower lobe, consistent with worsened  primary lung malignancy and/or metastases. 3. Unchanged very subtle sclerotic osseous lesions of the right aspect of L4 and of the right iliac wing, consistent with treated osseous metastatic disease. No obvious new osseous metastases. 4. Emphysema. 5. Coronary artery disease. 6. Unchanged appearance of aortobiiliac stent endograft repair with snorkel stenting of the abdominal branch vessels. Aortic Atherosclerosis (ICD10-I70.0) and Emphysema (ICD10-J43.9). Electronically Signed   By: ADelanna AhmadiM.D.   On: 07/25/2022 23:06   CT Abdomen Pelvis W Contrast  Result Date: 07/25/2022 CLINICAL DATA:  Metastatic right lower lobe lung cancer restaging * Tracking Code: BO * EXAM: CT CHEST, ABDOMEN, AND PELVIS WITH CONTRAST TECHNIQUE: Multidetector CT imaging of the chest, abdomen and pelvis was performed following the standard protocol during bolus  administration of intravenous contrast. RADIATION DOSE REDUCTION: This exam was performed according to the departmental dose-optimization program which includes automated exposure control, adjustment of the mA and/or kV according to patient size and/or use of iterative reconstruction technique. CONTRAST:  169m OMNIPAQUE IOHEXOL 300 MG/ML  SOLN COMPARISON:  05/27/2022 FINDINGS: CT CHEST FINDINGS Cardiovascular: Right chest port catheter. Aortic atherosclerosis. Normal heart size. Three-vessel coronary artery calcifications. No pericardial effusion. Mediastinum/Nodes: Interval enlargement of pretracheal, subcarinal, and right hilar lymph nodes, pretracheal nodes measuring 2.3 x 2.1 cm, previously 1.7 x 1.2 cm, subcarinal nodes measuring up to 2.9 x 2.4 cm, previously 2.0 x 1.8 cm (series 2, image 34). Thyroid gland, trachea, and esophagus demonstrate no significant findings. Lungs/Pleura: Moderate to severe centrilobular and paraseptal emphysema. Slight interval enlargement of nodules of the peripheral right lower lobe, larger measuring 1.8 x 0.8 cm, previously 1.6 x 0.5 cm  (series 5, image 107), and 0.8 x 0.7 cm, previously 0.5 cm (series 5, image 97). No pleural effusion or pneumothorax. Musculoskeletal: No chest wall abnormality. No acute osseous findings. Status post right shoulder arthroplasty. CT ABDOMEN PELVIS FINDINGS Hepatobiliary: No solid liver abnormality is seen. No gallstones, gallbladder wall thickening, or biliary dilatation. Pancreas: Unremarkable. No pancreatic ductal dilatation or surrounding inflammatory changes. Spleen: Normal in size without significant abnormality. Adrenals/Urinary Tract: Adrenal glands are unremarkable. Kidneys are normal, without renal calculi, solid lesion, or hydronephrosis. Bladder is unremarkable. Stomach/Bowel: Stomach is within normal limits. Appendix is not clearly visualized. No evidence of bowel wall thickening, distention, or inflammatory changes. Large burden of stool, predominantly in the right colon. Descending and sigmoid diverticulosis. Vascular/Lymphatic: Aortic atherosclerosis. Unchanged appearance of aortobiiliac stent endograft repair with snorkel stenting of the abdominal branch vessels. No enlarged abdominal or pelvic lymph nodes. Reproductive: No mass or other abnormality. Other: No abdominal wall hernia or abnormality. No ascites. Musculoskeletal: No acute osseous findings. Unchanged very subtle sclerotic osseous lesions of the right aspect L4 (series 6, image 121) and of the right iliac wing (series 2, image 99). IMPRESSION: 1. Interval enlargement of pretracheal, subcarinal, and right hilar lymph nodes, consistent with worsened nodal metastatic disease. 2. Slight interval enlargement of nodules of the peripheral right lower lobe, consistent with worsened primary lung malignancy and/or metastases. 3. Unchanged very subtle sclerotic osseous lesions of the right aspect of L4 and of the right iliac wing, consistent with treated osseous metastatic disease. No obvious new osseous metastases. 4. Emphysema. 5. Coronary artery  disease. 6. Unchanged appearance of aortobiiliac stent endograft repair with snorkel stenting of the abdominal branch vessels. Aortic Atherosclerosis (ICD10-I70.0) and Emphysema (ICD10-J43.9). Electronically Signed   By: ADelanna AhmadiM.D.   On: 07/25/2022 23:06     ASSESSMENT/PLAN:  This is a very pleasant 76years old Caucasian male with extensive stage (T1c, N2, M1C) small cell lung cancer presented with right lower lobe lung nodule in addition to right hilar and mediastinal lymphadenopathy and bone metastasis involving thoracic spines as well as the right iliac crest diagnosed in July 2023.    The patient started systemic chemotherapy with carboplatin for AUC of 5 on day 1, etoposide 100 Mg/M2 on days 1, 2 and 3 as well as Cosela 240 Mg/M2 on the days of the chemotherapy and Imfinzi 1500 Mg IV every 3 weeks with the induction treatment.  He is status post 6 cycles.  Starting from cycle #5 the patient will be on maintenance treatment with Imfinzi 1500 Mg IV every 4 weeks.   The patient recently had a restaging  CT scan performed. Dr. Julien Nordmann personally and independently reviewed the scan and discussed the results with the patient. The scan showed evidence of disease progression with***.   Dr. Julien Nordmann recommends discontinuing treatment. Dr. Julien Nordmann gave the patient the option of hospice/palliative care vs switching to second line chemotherapy with Zepzelca every 3 weeks. The patient is interested in this option and he is expected to undergo his first cycle of treatment next week.   The adverse side effects of treatment were discussed including but not limited to myelosuppression, fatigue, nausea, vomiting, renal and liver dysfunction.   We will see him back for a follow up visit in 4 weeks for evaluation and repeat blood work before starting cycle #2.   The patient was advised to call immediately if he has any concerning symptoms in the interval. The patient voices understanding of current disease  status and treatment options and is in agreement with the current care plan. All questions were answered. The patient knows to call the clinic with any problems, questions or concerns. We can certainly see the patient much sooner if necessary       No orders of the defined types were placed in this encounter.    I spent {CHL ONC TIME VISIT - ALCMI:[id number]} counseling the patient face to face. The total time spent in the appointment was {CHL ONC TIME VISIT - RRMUU:[id number]}.  Markell Schrier L Amra Shukla, PA-C 07/26/22

## 2022-07-27 ENCOUNTER — Other Ambulatory Visit: Payer: Self-pay | Admitting: Cardiology

## 2022-07-27 DIAGNOSIS — I4892 Unspecified atrial flutter: Secondary | ICD-10-CM

## 2022-07-27 NOTE — Telephone Encounter (Signed)
Refill sent on 07/23/22.

## 2022-07-28 ENCOUNTER — Inpatient Hospital Stay (HOSPITAL_BASED_OUTPATIENT_CLINIC_OR_DEPARTMENT_OTHER): Payer: Medicare HMO | Admitting: Internal Medicine

## 2022-07-28 ENCOUNTER — Inpatient Hospital Stay: Payer: Medicare HMO

## 2022-07-28 ENCOUNTER — Other Ambulatory Visit: Payer: Self-pay

## 2022-07-28 ENCOUNTER — Inpatient Hospital Stay: Payer: Medicare HMO | Admitting: Physician Assistant

## 2022-07-28 ENCOUNTER — Inpatient Hospital Stay: Payer: Medicare HMO | Attending: Internal Medicine

## 2022-07-28 VITALS — BP 146/65 | HR 76

## 2022-07-28 DIAGNOSIS — C7951 Secondary malignant neoplasm of bone: Secondary | ICD-10-CM | POA: Insufficient documentation

## 2022-07-28 DIAGNOSIS — Z95828 Presence of other vascular implants and grafts: Secondary | ICD-10-CM

## 2022-07-28 DIAGNOSIS — C3431 Malignant neoplasm of lower lobe, right bronchus or lung: Secondary | ICD-10-CM | POA: Insufficient documentation

## 2022-07-28 DIAGNOSIS — Z5112 Encounter for antineoplastic immunotherapy: Secondary | ICD-10-CM | POA: Diagnosis not present

## 2022-07-28 DIAGNOSIS — Z79899 Other long term (current) drug therapy: Secondary | ICD-10-CM | POA: Insufficient documentation

## 2022-07-28 DIAGNOSIS — I4892 Unspecified atrial flutter: Secondary | ICD-10-CM

## 2022-07-28 LAB — TSH: TSH: 0.923 u[IU]/mL (ref 0.350–4.500)

## 2022-07-28 LAB — CMP (CANCER CENTER ONLY)
ALT: 19 U/L (ref 0–44)
AST: 22 U/L (ref 15–41)
Albumin: 4.4 g/dL (ref 3.5–5.0)
Alkaline Phosphatase: 50 U/L (ref 38–126)
Anion gap: 6 (ref 5–15)
BUN: 16 mg/dL (ref 8–23)
CO2: 27 mmol/L (ref 22–32)
Calcium: 9.3 mg/dL (ref 8.9–10.3)
Chloride: 101 mmol/L (ref 98–111)
Creatinine: 0.94 mg/dL (ref 0.61–1.24)
GFR, Estimated: >60 mL/min (ref >60)
Glucose, Bld: 320 mg/dL — ABNORMAL HIGH (ref 70–99)
Potassium: 4.2 mmol/L (ref 3.5–5.1)
Sodium: 134 mmol/L — ABNORMAL LOW (ref 135–145)
Total Bilirubin: 0.5 mg/dL (ref 0.3–1.2)
Total Protein: 6.7 g/dL (ref 6.5–8.1)

## 2022-07-28 LAB — CBC WITH DIFFERENTIAL (CANCER CENTER ONLY)
Abs Immature Granulocytes: 0.01 10*3/uL (ref 0.00–0.07)
Basophils Absolute: 0.1 10*3/uL (ref 0.0–0.1)
Basophils Relative: 1 %
Eosinophils Absolute: 0 10*3/uL (ref 0.0–0.5)
Eosinophils Relative: 1 %
HCT: 38.3 % — ABNORMAL LOW (ref 39.0–52.0)
Hemoglobin: 13.1 g/dL (ref 13.0–17.0)
Immature Granulocytes: 0 %
Lymphocytes Relative: 19 %
Lymphs Abs: 1.4 10*3/uL (ref 0.7–4.0)
MCH: 31.9 pg (ref 26.0–34.0)
MCHC: 34.2 g/dL (ref 30.0–36.0)
MCV: 93.2 fL (ref 80.0–100.0)
Monocytes Absolute: 0.4 10*3/uL (ref 0.1–1.0)
Monocytes Relative: 6 %
Neutro Abs: 5.4 10*3/uL (ref 1.7–7.7)
Neutrophils Relative %: 73 %
Platelet Count: 152 10*3/uL (ref 150–400)
RBC: 4.11 MIL/uL — ABNORMAL LOW (ref 4.22–5.81)
RDW: 12.8 % (ref 11.5–15.5)
WBC Count: 7.3 10*3/uL (ref 4.0–10.5)
nRBC: 0 % (ref 0.0–0.2)

## 2022-07-28 MED ORDER — SODIUM CHLORIDE 0.9% FLUSH
10.0000 mL | Freq: Once | INTRAVENOUS | Status: AC
  Administered 2022-07-28: 10 mL

## 2022-07-28 MED ORDER — SODIUM CHLORIDE 0.9 % IV SOLN
1500.0000 mg | Freq: Once | INTRAVENOUS | Status: AC
  Administered 2022-07-28: 1500 mg via INTRAVENOUS
  Filled 2022-07-28: qty 30

## 2022-07-28 MED ORDER — HEPARIN SOD (PORK) LOCK FLUSH 100 UNIT/ML IV SOLN
500.0000 [IU] | Freq: Once | INTRAVENOUS | Status: AC | PRN
  Administered 2022-07-28: 500 [IU]

## 2022-07-28 MED ORDER — SODIUM CHLORIDE 0.9% FLUSH
10.0000 mL | INTRAVENOUS | Status: DC | PRN
  Administered 2022-07-28: 10 mL

## 2022-07-28 MED ORDER — APIXABAN 5 MG PO TABS: 5.0000 mg | ORAL_TABLET | Freq: Two times a day (BID) | ORAL | 0 refills | Status: DC

## 2022-07-28 MED ORDER — SODIUM CHLORIDE 0.9 % IV SOLN: Freq: Once | INTRAVENOUS | Status: AC

## 2022-07-28 NOTE — Telephone Encounter (Signed)
Prescription refill request for Eliquis received. Indication:Aflutter Last office visit: 03/13/21 Agustin Cree)  Scr: 0.85 (06/30/22)  Age: 76 Weight: 68.9kg  Pt overdue to see cardiologist. Pt has scheduled appt with Dr Agustin Cree on 09/07/22. Appropriate dose and refill sent to requested pharmacy.

## 2022-07-28 NOTE — Progress Notes (Signed)
[place] [phone]   [phone]  OFFICE PROGRESS NOTE  [name], DO [place] [address]  DIAGNOSIS:  1) Extensive stage (T1c, N2, M1C) small cell lung cancer presented with right lower lobe lung nodule in addition to right hilar and mediastinal lymphadenopathy and bone metastasis involving thoracic spines as well as the right iliac crest diagnosed in July 2023. 2) immunotherapy mediated type 1 diabetes mellitus diagnosed in October 2023.  PRIOR THERAPY: None  CURRENT THERAPY: Systemic chemotherapy with carboplatin for AUC of 5 on day 1, etoposide 100 Mg/M2 on days 1, 2 and 3 with Imfinzi 1500 Mg IV on day 1 and Cosela 240 Mg/M2 on the days of the chemotherapy as well as Imfinzi 1500 Mg IV on day 1 every 3 weeks the first 4 cycles followed by maintenance treatment every 4 weeks starting from cycle #5.  First dose was giving 03/10/2022.  The patient status post 6 cycles.  Starting from cycle #5 the patient will be on maintenance treatment with Imfinzi 1500 Mg IV every 4 weeks.  INTERVAL HISTORY: RODEL GLASPY 76 y.o. male returns to the clinic today for follow-up visit.  The patient is feeling fine today with no concerning complaints except for fatigue.  He has no current chest pain, shortness of breath, cough or hemoptysis.  He has no nausea, vomiting, diarrhea or constipation.  He has no headache or visual changes.  He continues to work with his endocrinologist for management of his type 1 diabetes mellitus was immunotherapy mediated.  He had repeat CT scan of the chest, abdomen and pelvis performed recently and he is here for evaluation and discussion of his scan results.   MEDICAL HISTORY: Past Medical History:  Diagnosis Date   Anxiety    Arthritis    osteoarthrititis- knees and most joints.   COPD (chronic obstructive pulmonary disease) (HCC)    moderate -no regular use of inhalers- rare use of oxygen as  sexual activity   Dyspnea    outside in hot weather and also with pollen   Hyperlipidemia    Hypertension    Neuromuscular disorder ([place])    feet   Paroxysmal atrial flutter ([place]) 01/15/2021   Thoracoabdominal aneurysm (HCC)    s/p FEVAR 4 Vessel TABME 02/19/20 Dr. Katy Apo    ALLERGIES:  is allergic to losartan, atorvastatin calcium, and pollen extract-tree extract [pollen extract].  MEDICATIONS:  Current Outpatient Medications  Medication Sig Dispense Refill   Alum Hydroxide-Mag Trisilicate (GAVISCON) 78-24.2 MG CHEW Chew 1 tablet by mouth daily as needed (heartburn).     apixaban (ELIQUIS) 5 MG TABS tablet Take 1 tablet (5 mg total) by mouth 2 (two) times daily. 180 tablet 0   atorvastatin (LIPITOR) 20 MG tablet Take 20 mg by mouth at bedtime.     blood glucose meter kit and supplies Dispense based on patient and insurance preference. Use up to four times daily as directed. (FOR ICD-10 E10.9, E11.9). 1 each 0   clonazePAM (KLONOPIN) 1 MG tablet Take 1 tablet (1 mg total) by mouth 2 (two) times daily as needed for anxiety. 60 tablet 5   COMIRNATY SUSP injection      Continuous Blood Gluc Sensor (DEXCOM G7 SENSOR) MISC 1 Device by Does not apply route as directed. 9 each 3   fluticasone (FLONASE) 50 MCG/ACT nasal spray Place 1 spray into both nostrils 2 (two) times daily as needed for allergies.  insulin aspart (NOVOLOG FLEXPEN) 100 UNIT/ML FlexPen Max daily 30 units 15 mL 11   insulin glargine (LANTUS) 100 UNIT/ML Solostar Pen Inject 14 Units into the skin daily. 15 mL 11   Insulin Pen Needle 32G X 4 MM MISC 1 Device by Does not apply route in the morning, at noon, in the evening, and at bedtime. 400 each 3   Lancets (ONETOUCH DELICA PLUS [id number]) MISC Apply topically 4 (four) times daily.     lidocaine-prilocaine (EMLA) cream Apply to the Port-A-Cath site 30-60 minutes before chemotherapy 30 g 0   meloxicam (MOBIC) 15 MG tablet Take 15 mg by mouth daily as needed for pain.      Multiple Vitamin (MULTIVITAMIN WITH MINERALS) TABS tablet Take 1 tablet by mouth daily. 30 tablet 5   Naphazoline HCl (CLEAR EYES OP) Place 1 drop into both eyes daily.     ONETOUCH ULTRA test strip 1 each by Other route 3 (three) times daily. for testing 300 each 3   PROAIR HFA 108 (90 BASE) MCG/ACT inhaler Inhale 2 puffs into the lungs every 6 (six) hours as needed for wheezing or shortness of breath.   2   sildenafil (REVATIO) 20 MG tablet Take 60 mg by mouth daily as needed (ED).     Tiotropium Bromide-Olodaterol (STIOLTO RESPIMAT) 2.5-2.5 MCG/ACT AERS Inhale 2 puffs into the lungs daily. 4 g 0   traZODone (DESYREL) 50 MG tablet Take 50 mg by mouth at bedtime.     triamcinolone cream (KENALOG) 0.1 % Apply 1 Application topically daily.     No current facility-administered medications for this visit.    SURGICAL HISTORY:  Past Surgical History:  Procedure Laterality Date   ABDOMINAL AORTIC ANEURYSM REPAIR  02/06/2020   BRONCHIAL BIOPSY  02/23/2022   Procedure: BRONCHIAL BIOPSIES;  Surgeon: Collene Gobble, MD;  Location: [facility]COPY;  Service: Pulmonary;;   BRONCHIAL BRUSHINGS  02/23/2022   Procedure: BRONCHIAL BRUSHINGS;  Surgeon: Collene Gobble, MD;  Location: [facility] ENDOSCOPY;  Service: Pulmonary;;   BRONCHIAL NEEDLE ASPIRATION BIOPSY  02/23/2022   Procedure: BRONCHIAL NEEDLE ASPIRATION BIOPSIES;  Surgeon: Collene Gobble, MD;  Location: [place] ENDOSCOPY;  Service: Pulmonary;;   BROW LIFT Bilateral 12/11/2021   Procedure: UPPER LID BLEPHAROPLASTY;  Surgeon: Irene Limbo, MD;  Location: [place];  Service: Plastics;  Laterality: Bilateral;   CYST REMOVAL LEG Left 07/03/2021   Procedure: EXCISION CYST LEFT BUTTOCK;  Surgeon: Jovita Kussmaul, MD;  Location: [place];  Service: General;  Laterality: Left;   EYE SURGERY Bilateral    cataract surgery   FINGER ARTHROPLASTY Left    left thumb-Dr. Amedeo Plenty   INGUINAL HERNIA REPAIR Left 07/03/2021   Procedure: LEFT INGUINAL HERNIA  REPAIR WITH MESH;  Surgeon: Jovita Kussmaul, MD;  Location: [place];  Service: General;  Laterality: Left;   INSERTION OF MESH N/A 07/03/2021   Procedure: INSERTION OF MESH X2;  Surgeon: Jovita Kussmaul, MD;  Location: [place];  Service: General;  Laterality: N/A;   IR IMAGING GUIDED PORT INSERTION  03/12/2022   IR RADIOLOGIST EVAL & MGMT  10/25/2019   KNEE ARTHROSCOPY Left    KNEE SURGERY Left    Bakers cyst x2   SHOULDER ARTHROSCOPY Right    thumb surgery     TONSILLECTOMY     TOTAL KNEE ARTHROPLASTY Left 03/19/2016   Procedure: LEFT TOTAL KNEE ARTHROPLASTY;  Surgeon: Sydnee Cabal, MD;  Location: [facility];  Service: Orthopedics;  Laterality: Left;  TOTAL SHOULDER REPLACEMENT Right    UMBILICAL HERNIA REPAIR N/A 07/03/2021   Procedure: UMBILICAL HERNIA REPAIR WITH MESH;  Surgeon: Jovita Kussmaul, MD;  Location: [place];  Service: General;  Laterality: N/A;   VASCULAR SURGERY     VASECTOMY     VIDEO BRONCHOSCOPY WITH ENDOBRONCHIAL ULTRASOUND Right 02/23/2022   Procedure: VIDEO BRONCHOSCOPY WITH ENDOBRONCHIAL ULTRASOUND;  Surgeon: Collene Gobble, MD;  Location[facility];  Service: Pulmonary;  Laterality: Right;   VIDEO BRONCHOSCOPY WITH RADIAL ENDOBRONCHIAL ULTRASOUND  02/23/2022   Procedure: VIDEO BRONCHOSCOPY WITH RADIAL ENDOBRONCHIAL ULTRASOUND;  Surgeon: Collene Gobble, MD;  Location: [facility] ENDOSCOPY;  Service: Pulmonary;;    REVIEW OF SYSTEMS:  Constitutional: positive for fatigue Eyes: negative Ears, nose, mouth, throat, and face: negative Respiratory: negative Cardiovascular: negative Gastrointestinal: negative Genitourinary:negative Integument/breast: negative Hematologic/lymphatic: negative Musculoskeletal:negative Neurological: negative Behavioral/Psych: negative Endocrine: negative Allergic/Immunologic: negative   PHYSICAL EXAMINATION: General appearance: alert, cooperative, fatigued, and no distress Head: Normocephalic, without obvious abnormality, atraumatic Neck: no  adenopathy, no JVD, supple, symmetrical, trachea midline, and thyroid not enlarged, symmetric, no tenderness/mass/nodules Lymph nodes: Cervical, supraclavicular, and axillary nodes normal. Resp: clear to auscultation bilaterally Back: symmetric, no curvature. ROM normal. No CVA tenderness. Cardio: regular rate and rhythm, S1, S2 normal, no murmur, click, rub or gallop GI: soft, non-tender; bowel sounds normal; no masses,  no organomegaly Extremities: extremities normal, atraumatic, no cyanosis or edema Neurologic: Alert and oriented X 3, normal strength and tone. Normal symmetric reflexes. Normal coordination and gait  ECOG PERFORMANCE STATUS: 1 - Symptomatic but completely ambulatory  Blood pressure (!) 143/82, pulse 73, temperature 98.2 F (36.8 C), temperature source Oral, resp. rate 16, weight 157 lb 1.6 oz (71.3 kg), SpO2 96 %.  LABORATORY DATA: Lab Results  Component Value Date   WBC 7.3 07/28/2022   HGB 13.1 07/28/2022   HCT 38.3 (L) 07/28/2022   MCV 93.2 07/28/2022   PLT 152 07/28/2022      Chemistry      Component Value Date/Time   NA 134 (L) 06/30/2022 0941   NA 138 02/03/2021 0944   K 4.0 06/30/2022 0941   CL 100 06/30/2022 0941   CO2 26 06/30/2022 0941   BUN 20 06/30/2022 0941   BUN 13 02/03/2021 0944   CREATININE 0.85 06/30/2022 0941      Component Value Date/Time   CALCIUM 9.9 06/30/2022 0941   ALKPHOS 55 06/30/2022 0941   AST 15 06/30/2022 0941   ALT 16 06/30/2022 0941   BILITOT 0.7 06/30/2022 0941       RADIOGRAPHIC STUDIES: CT Chest W Contrast  Result Date: 07/25/2022 CLINICAL DATA:  Metastatic right lower lobe lung cancer restaging * Tracking Code: BO * EXAM: CT CHEST, ABDOMEN, AND PELVIS WITH CONTRAST TECHNIQUE: Multidetector CT imaging of the chest, abdomen and pelvis was performed following the standard protocol during bolus administration of intravenous contrast. RADIATION DOSE REDUCTION: This exam was performed according to the departmental  dose-optimization program which includes automated exposure control, adjustment of the mA and/or kV according to patient size and/or use of iterative reconstruction technique. CONTRAST:  126m OMNIPAQUE IOHEXOL 300 MG/ML  SOLN COMPARISON:  05/27/2022 FINDINGS: CT CHEST FINDINGS Cardiovascular: Right chest port catheter. Aortic atherosclerosis. Normal heart size. Three-vessel coronary artery calcifications. No pericardial effusion. Mediastinum/Nodes: Interval enlargement of pretracheal, subcarinal, and right hilar lymph nodes, pretracheal nodes measuring 2.3 x 2.1 cm, previously 1.7 x 1.2 cm, subcarinal nodes measuring up to 2.9 x 2.4 cm, previously 2.0 x 1.8 cm (series  2, image 34). Thyroid gland, trachea, and esophagus demonstrate no significant findings. Lungs/Pleura: Moderate to severe centrilobular and paraseptal emphysema. Slight interval enlargement of nodules of the peripheral right lower lobe, larger measuring 1.8 x 0.8 cm, previously 1.6 x 0.5 cm (series 5, image 107), and 0.8 x 0.7 cm, previously 0.5 cm (series 5, image 97). No pleural effusion or pneumothorax. Musculoskeletal: No chest wall abnormality. No acute osseous findings. Status post right shoulder arthroplasty. CT ABDOMEN PELVIS FINDINGS Hepatobiliary: No solid liver abnormality is seen. No gallstones, gallbladder wall thickening, or biliary dilatation. Pancreas: Unremarkable. No pancreatic ductal dilatation or surrounding inflammatory changes. Spleen: Normal in size without significant abnormality. Adrenals/Urinary Tract: Adrenal glands are unremarkable. Kidneys are normal, without renal calculi, solid lesion, or hydronephrosis. Bladder is unremarkable. Stomach/Bowel: Stomach is within normal limits. Appendix is not clearly visualized. No evidence of bowel wall thickening, distention, or inflammatory changes. Large burden of stool, predominantly in the right colon. Descending and sigmoid diverticulosis. Vascular/Lymphatic: Aortic  atherosclerosis. Unchanged appearance of aortobiiliac stent endograft repair with snorkel stenting of the abdominal branch vessels. No enlarged abdominal or pelvic lymph nodes. Reproductive: No mass or other abnormality. Other: No abdominal wall hernia or abnormality. No ascites. Musculoskeletal: No acute osseous findings. Unchanged very subtle sclerotic osseous lesions of the right aspect L4 (series 6, image 121) and of the right iliac wing (series 2, image 99). IMPRESSION: 1. Interval enlargement of pretracheal, subcarinal, and right hilar lymph nodes, consistent with worsened nodal metastatic disease. 2. Slight interval enlargement of nodules of the peripheral right lower lobe, consistent with worsened primary lung malignancy and/or metastases. 3. Unchanged very subtle sclerotic osseous lesions of the right aspect of L4 and of the right iliac wing, consistent with treated osseous metastatic disease. No obvious new osseous metastases. 4. Emphysema. 5. Coronary artery disease. 6. Unchanged appearance of aortobiiliac stent endograft repair with snorkel stenting of the abdominal branch vessels. Aortic Atherosclerosis (ICD10-I70.0) and Emphysema (ICD10-J43.9). Electronically Signed   By: Delanna Ahmadi M.D.   On: 07/25/2022 23:06   CT Abdomen Pelvis W Contrast  Result Date: 07/25/2022 CLINICAL DATA:  Metastatic right lower lobe lung cancer restaging * Tracking Code: BO * EXAM: CT CHEST, ABDOMEN, AND PELVIS WITH CONTRAST TECHNIQUE: Multidetector CT imaging of the chest, abdomen and pelvis was performed following the standard protocol during bolus administration of intravenous contrast. RADIATION DOSE REDUCTION: This exam was performed according to the departmental dose-optimization program which includes automated exposure control, adjustment of the mA and/or kV according to patient size and/or use of iterative reconstruction technique. CONTRAST:  159m OMNIPAQUE IOHEXOL 300 MG/ML  SOLN COMPARISON:  05/27/2022  FINDINGS: CT CHEST FINDINGS Cardiovascular: Right chest port catheter. Aortic atherosclerosis. Normal heart size. Three-vessel coronary artery calcifications. No pericardial effusion. Mediastinum/Nodes: Interval enlargement of pretracheal, subcarinal, and right hilar lymph nodes, pretracheal nodes measuring 2.3 x 2.1 cm, previously 1.7 x 1.2 cm, subcarinal nodes measuring up to 2.9 x 2.4 cm, previously 2.0 x 1.8 cm (series 2, image 34). Thyroid gland, trachea, and esophagus demonstrate no significant findings. Lungs/Pleura: Moderate to severe centrilobular and paraseptal emphysema. Slight interval enlargement of nodules of the peripheral right lower lobe, larger measuring 1.8 x 0.8 cm, previously 1.6 x 0.5 cm (series 5, image 107), and 0.8 x 0.7 cm, previously 0.5 cm (series 5, image 97). No pleural effusion or pneumothorax. Musculoskeletal: No chest wall abnormality. No acute osseous findings. Status post right shoulder arthroplasty. CT ABDOMEN PELVIS FINDINGS Hepatobiliary: No solid liver abnormality is seen. No gallstones, gallbladder  wall thickening, or biliary dilatation. Pancreas: Unremarkable. No pancreatic ductal dilatation or surrounding inflammatory changes. Spleen: Normal in size without significant abnormality. Adrenals/Urinary Tract: Adrenal glands are unremarkable. Kidneys are normal, without renal calculi, solid lesion, or hydronephrosis. Bladder is unremarkable. Stomach/Bowel: Stomach is within normal limits. Appendix is not clearly visualized. No evidence of bowel wall thickening, distention, or inflammatory changes. Large burden of stool, predominantly in the right colon. Descending and sigmoid diverticulosis. Vascular/Lymphatic: Aortic atherosclerosis. Unchanged appearance of aortobiiliac stent endograft repair with snorkel stenting of the abdominal branch vessels. No enlarged abdominal or pelvic lymph nodes. Reproductive: No mass or other abnormality. Other: No abdominal wall hernia or  abnormality. No ascites. Musculoskeletal: No acute osseous findings. Unchanged very subtle sclerotic osseous lesions of the right aspect L4 (series 6, image 121) and of the right iliac wing (series 2, image 99). IMPRESSION: 1. Interval enlargement of pretracheal, subcarinal, and right hilar lymph nodes, consistent with worsened nodal metastatic disease. 2. Slight interval enlargement of nodules of the peripheral right lower lobe, consistent with worsened primary lung malignancy and/or metastases. 3. Unchanged very subtle sclerotic osseous lesions of the right aspect of L4 and of the right iliac wing, consistent with treated osseous metastatic disease. No obvious new osseous metastases. 4. Emphysema. 5. Coronary artery disease. 6. Unchanged appearance of aortobiiliac stent endograft repair with snorkel stenting of the abdominal branch vessels. Aortic Atherosclerosis (ICD10-I70.0) and Emphysema (ICD10-J43.9). Electronically Signed   By: Delanna Ahmadi M.D.   On: 07/25/2022 23:06    ASSESSMENT AND PLAN: This is a very pleasant 76 years old white male with extensive stage (T1c, N2, M1C) small cell lung cancer presented with right lower lobe lung nodule in addition to right hilar and mediastinal lymphadenopathy and bone metastasis involving thoracic spines as well as the right iliac crest diagnosed in July 2023. The patient started systemic chemotherapy with carboplatin for AUC of 5 on day 1, etoposide 100 Mg/M2 on days 1, 2 and 3 as well as Cosela 240 Mg/M2 on the days of the chemotherapy and Imfinzi 1500 Mg IV every 3 weeks with the induction treatment.  He is status post 6 cycles.  Starting from cycle #5 the patient will be on maintenance treatment with Imfinzi 1500 Mg IV every 4 weeks. The patient has been tolerating this treatment well with no concerning adverse effects. He had repeat CT scan of the chest, abdomen and pelvis performed recently.  I personally and independently reviewed the scan images and  discussed results with the patient today. His scan showed interval enlargement of pretracheal, subcarinal and right hilar lymph node consistent with worsening nodal metastatic disease.  There was also slight interval enlargement of nodules of the peripheral right lower lobe consistent with worsening primary lung malignancy or metastasis.  There is no other evidence of progressive disease in the bone, or liver. I discussed with the patient the option of discontinuing this treatment with consideration of second line systemic chemotherapy versus with palliative radiotherapy to the progressive local disease in the chest and continuing the current treatment with immunotherapy for now.  I would consider changing his treatment to a second line systemic chemotherapy if he develop any further disease progression. The patient is in agreement with this plan of continuing his treatment and the palliative radiation. I will see him back for follow-up visit in 4 weeks for evaluation before starting cycle #8. For the immunotherapy mediated type 1 diabetes mellitus, he will continue his evaluation and treatment under Dr. Kelton Pillar. The patient was  advised to call immediately if he has any other concerning symptoms in the interval. The patient voices understanding of current disease status and treatment options and is in agreement with the current care plan.  All questions were answered. The patient knows to call the clinic with any problems, questions or concerns. We can certainly see the patient much sooner if necessary.  The total time spent in the appointment was 35 minutes.  Disclaimer: This note was dictated with voice recognition software. Similar sounding words can inadvertently be transcribed and may not be corrected upon review.

## 2022-07-28 NOTE — Patient Instructions (Addendum)
[facility]  Discharge Instructions: Thank you for choosing [facility] to provide your oncology and hematology care.   If you have a lab appointment with the [facility], please go directly to the [facility] and check in at the registration area.   Wear comfortable clothing and clothing appropriate for easy access to any Portacath or PICC line.   We strive to give you quality time with your provider. You may need to reschedule your appointment if you arrive late (15 or more minutes).  Arriving late affects you and other patients whose appointments are after yours.  Also, if you miss three or more appointments without notifying the office, you may be dismissed from the clinic at the provider's discretion.      For prescription refill requests, have your pharmacy contact our office and allow 72 hours for refills to be completed.    Today you received the following chemotherapy and/or immunotherapy agents: Imfinzi       To help prevent nausea and vomiting after your treatment, we encourage you to take your nausea medication as directed.  BELOW ARE SYMPTOMS THAT SHOULD BE REPORTED IMMEDIATELY: *FEVER GREATER THAN 100.4 F (38 C) OR HIGHER *CHILLS OR SWEATING *NAUSEA AND VOMITING THAT IS NOT CONTROLLED WITH YOUR NAUSEA MEDICATION *UNUSUAL SHORTNESS OF BREATH *UNUSUAL BRUISING OR BLEEDING *URINARY PROBLEMS (pain or burning when urinating, or frequent urination) *BOWEL PROBLEMS (unusual diarrhea, constipation, pain near the anus) TENDERNESS IN MOUTH AND THROAT WITH OR WITHOUT PRESENCE OF ULCERS (sore throat, sores in mouth, or a toothache) UNUSUAL RASH, SWELLING OR PAIN  UNUSUAL VAGINAL DISCHARGE OR ITCHING   Items with * indicate a potential emergency and should be followed up as soon as possible or go to the Emergency Department if any problems should occur.  Please show the CHEMOTHERAPY ALERT CARD or IMMUNOTHERAPY ALERT CARD at check-in to  the Emergency Department and triage nurse.  Should you have questions after your visit or need to cancel or reschedule your appointment, please contact [facility]  Dept: [phone]  and follow the prompts.  Office hours are 8:00 a.m. to 4:30 p.m. Monday - Friday. Please note that voicemails left after 4:00 p.m. may not be returned until the following business day.  We are closed weekends and major holidays. You have access to a nurse at all times for urgent questions. Please call the main number to the clinic Dept: [phone] and follow the prompts.   For any non-urgent questions, you may also contact your provider using MyChart. We now offer e-Visits for anyone 50 and older to request care online for non-urgent symptoms. For details visit [email or web address].   Also download the MyChart app! Go to the app store, search "MyChart", open the app, select [facility], and log in with your MyChart username and password.

## 2022-07-29 NOTE — Progress Notes (Signed)
Thoracic Location of Tumor / Histology:  Small cell carcinoma of lower lobe of right lung  DIAGNOSIS:  1) Extensive stage (T1c, N2, M1C) small cell lung cancer presented with right lower lobe lung nodule in addition to right hilar and mediastinal lymphadenopathy and bone metastasis involving thoracic spines as well as the right iliac crest diagnosed in July 2023.   Patient presented months ago with symptoms of: was more short of breath  Biopsies revealed:  FINAL MICROSCOPIC DIAGNOSIS:  A. LUNG, RLL, BRUSHING: - Malignant - Small cell carcinoma  B. LUNG, RLL, FINE NEEDLE ASPIRATION: - Malignant - Small cell carcinoma  COMMENT: Immunohistochemical stains performed on the similar-appearing tumor within the station 7 lymph node ([id number]) support the above diagnoses.  There is very scant tumor within the fine-needle aspiration (B). SPECIMEN ADEQUACY: A. Satisfactory for Evaluation B.  Satisfactory for Evaluation GROSS: Received is/are (A)(1) 2 Slides (1 Quick Stain). (2) 2 Slides (1 Quick Stain). (B)(1) 2 Slides (1 Quick Stain). (2) 2 Slides (1 Quick Stain). Also received are 15cc's of red color fluid in saline solution from needle rinse. Biopsy/tissue pieces included in cell block. (JB:TC;tc) Smears: (A) 4 (B) 4 Concentration Method (ThinPrep): (A) 0 (B) 1 Cell Block: (A) 0 (B) 1 Conventional Additional Studies: EBUS (C  D) [id number]  Final Diagnosis performed by Tobin Chad, MD.   Electronically signed 02/25/2022   Tobacco/Marijuana/Snuff/ETOH use: none  Past/Anticipated interventions by cardiothoracic surgery, if any:  Video Bronchoscopy with Robotic Assisted Bronchoscopic Navigation and  Endobronchial Ultrasound Procedure Note   Date of Operation: 02/23/2022    Pre-op Diagnosis: Right lower lobe pulmonary nodules, mediastinal adenopathy   Post-op Diagnosis: Same   Surgeon: Baltazar Apo  Past/Anticipated interventions by medical oncology, if  any: ASSESSMENT AND PLAN: This is a very pleasant 76 years old white male with extensive stage (T1c, N2, M1C) small cell lung cancer presented with right lower lobe lung nodule in addition to right hilar and mediastinal lymphadenopathy and bone metastasis involving thoracic spines as well as the right iliac crest diagnosed in July 2023. The patient started systemic chemotherapy with carboplatin for AUC of 5 on day 1, etoposide 100 Mg/M2 on days 1, 2 and 3 as well as Cosela 240 Mg/M2 on the days of the chemotherapy and Imfinzi 1500 Mg IV every 3 weeks with the induction treatment.  He is status post 4 cycles.  Starting from cycle #5 the patient will be on maintenance treatment with Imfinzi 1500 Mg IV every 4 weeks. He had repeat CT scan of the chest, abdomen and pelvis performed recently.  I personally and independently reviewed the scan and discussed the result with the patient today. His scan showed stable disease with no concerning findings for metastasis. I recommended for the patient to continue his current treatment with maintenance Imfinzi every 4 weeks and he will proceed with cycle #5 today. For the immunotherapy mediated type 1 diabetes mellitus, he will continue his current treatment with insulin and he is supposed to see Dr. Kelton Pillar on June 09, 2022 for further management of his condition. The patient will come back for follow-up visit in 4 weeks for evaluation before starting cycle #6. He was advised to call immediately if he has any other concerning symptoms in the interval.   The patient voices understanding of current disease status and treatment options and is in agreement with the current care plan.   All questions were answered. The patient knows to call the clinic with any problems, questions or concerns.  We can certainly see the patient much sooner if necessary.   The total time spent in the appointment was 30 minutes.  Disclaimer: This note was dictated with voice  recognition software. Similar sounding words can inadvertently be transcribed and may not be corrected upon review.                 Electronically signed by Curt Bears, MD at 06/03/2022 10:18 AM  Signs/Symptoms Weight changes, if any: during chemo lost 25 lbs, regaining that wt now Respiratory complaints, if any: shortness of breath with exertion,  Hemoptysis, if any: none Pain issues, if any:  no pain in lungs, pain in knees generally  SAFETY ISSUES: Prior radiation? none Pacemaker/ICD? no Possible current pregnancy? no Is the patient on methotrexate? none  Current Complaints / other details:  wants to know side effects of radiation.   Vitals:   08/04/22 1341  BP: (!) 142/63  Pulse: 79  Resp: 20  Temp: 97.8 F (36.6 C)  SpO2: 97%    Wt Readings from Last 3 Encounters:  08/04/22 156 lb 6.4 oz (70.9 kg)  07/28/22 157 lb 1.6 oz (71.3 kg)  06/30/22 151 lb 12.8 oz (68.9 kg)

## 2022-07-31 ENCOUNTER — Other Ambulatory Visit: Payer: Self-pay

## 2022-08-04 ENCOUNTER — Encounter: Payer: Self-pay | Admitting: Radiation Oncology

## 2022-08-04 ENCOUNTER — Ambulatory Visit
Admission: RE | Admit: 2022-08-04 | Discharge: 2022-08-04 | Disposition: A | Discharge status: Home / Self Care | Payer: Medicare HMO | Source: Ambulatory Visit | Attending: Radiation Oncology | Admitting: Radiation Oncology

## 2022-08-04 VITALS — BP 142/63 | HR 79 | Temp 97.8°F | Resp 20 | Ht 68.0 in | Wt 156.4 lb

## 2022-08-04 DIAGNOSIS — Z87891 Personal history of nicotine dependence: Secondary | ICD-10-CM | POA: Insufficient documentation

## 2022-08-04 DIAGNOSIS — I10 Essential (primary) hypertension: Secondary | ICD-10-CM | POA: Diagnosis not present

## 2022-08-04 DIAGNOSIS — E1165 Type 2 diabetes mellitus with hyperglycemia: Secondary | ICD-10-CM | POA: Diagnosis not present

## 2022-08-04 DIAGNOSIS — Z79899 Other long term (current) drug therapy: Secondary | ICD-10-CM | POA: Insufficient documentation

## 2022-08-04 DIAGNOSIS — E785 Hyperlipidemia, unspecified: Secondary | ICD-10-CM | POA: Insufficient documentation

## 2022-08-04 DIAGNOSIS — Z794 Long term (current) use of insulin: Secondary | ICD-10-CM | POA: Diagnosis not present

## 2022-08-04 DIAGNOSIS — J432 Centrilobular emphysema: Secondary | ICD-10-CM | POA: Diagnosis not present

## 2022-08-04 DIAGNOSIS — I700 Atherosclerosis of aorta: Secondary | ICD-10-CM | POA: Diagnosis not present

## 2022-08-04 DIAGNOSIS — I2510 Atherosclerotic heart disease of native coronary artery without angina pectoris: Secondary | ICD-10-CM | POA: Insufficient documentation

## 2022-08-04 DIAGNOSIS — Z8041 Family history of malignant neoplasm of ovary: Secondary | ICD-10-CM | POA: Insufficient documentation

## 2022-08-04 DIAGNOSIS — I480 Paroxysmal atrial fibrillation: Secondary | ICD-10-CM | POA: Insufficient documentation

## 2022-08-04 DIAGNOSIS — C3431 Malignant neoplasm of lower lobe, right bronchus or lung: Secondary | ICD-10-CM

## 2022-08-04 DIAGNOSIS — C7951 Secondary malignant neoplasm of bone: Secondary | ICD-10-CM | POA: Diagnosis not present

## 2022-08-04 DIAGNOSIS — J449 Chronic obstructive pulmonary disease, unspecified: Secondary | ICD-10-CM | POA: Diagnosis not present

## 2022-08-04 DIAGNOSIS — Z7901 Long term (current) use of anticoagulants: Secondary | ICD-10-CM | POA: Diagnosis not present

## 2022-08-04 NOTE — Progress Notes (Signed)
Radiation Oncology         [phone] ________________________________  Initial Outpatient Consultation  Name: Adam Jordan MRN: [id number]  Date: 08/04/2022  DOB: [birth year]  TG:GYIRSWN, [name], DO  Curt Bears, MD   REFERRING PHYSICIAN: Curt Bears, MD  DIAGNOSIS:    ICD-10-CM   1. Small cell carcinoma of lower lobe of right lung (HCC)  C34.31        Cancer Staging  Small cell carcinoma of lower lobe of right lung (HCC) Staging form: Lung, AJCC 8th Edition - Clinical: Stage IVB (cT1c, cN2, cM1c) - Signed by Curt Bears, MD on 03/04/2022  Extensive stage (T1c, N2, M1c) small cell lung cancer presented with right lower lobe lung nodule in addition to right hilar and mediastinal lymphadenopathy and bone metastasis involving thoracic spines as well as the right iliac crest diagnosed in July 2023.   Now with progressive disease in the right lower lobe and lymph nodes (December 2023)  CHIEF COMPLAINT: Here to discuss management of lung cancer  HISTORY OF PRESENT ILLNESS::Adam Jordan is a 77 y.o. male who presented for a CT of the abdomen and pelvis and CTA of the chest on 11/29/2019 for evaluation of an AAA which incidentally revealed several pulmonary nodules.  Follow-up CTA CAP performed on 09/03/2021 showed a 9 mm superior right lower lobe nodule, (previously measuring 7 mm on imaging from 02/19/21 and 01/04/2020), a new 8 mm right lower lobe nodule, a stable 6 mm left upper lobe nodule, and a new patchy nodular opacity in the right lower lobe. No mediastinal or hilar lymph nodes were appreciated.   Subsequently, he was referred to Dr. Lamonte Sakai in February 2023 for further evaluation and management. During this visit, the patient endorsed intermittent aspiration symptoms. He also reported using albuterol approximately twice monthly for his COPD. Dr. Lamonte Sakai reviewed his CT and advised proceeding with follow-up imaging in 6 months.   Follow-up chest CT without contrast on  02/08/22 demonstrated spiculated nodules in the right lower lobe with right hilar and subcarinal adenopathy, concerning for stage IIIA bronchogenic carcinoma.   Accordingly, the patient opted to proceed with bronchoscopy with biopsies of the RLL on 02/23/22 under the care of Dr. Lamonte Sakai. FNA of the RLL revealed findings consistent with small cell carcinoma. FNA of lymph node 10R and a station 7 lymph node also showed malignant cells consistent with small cell carcinoma.   PET scan on 03/03/22 showed:  2 FDG avid lesions within the right lower lobe compatible with primary bronchogenic carcinoma, FDG avid ipsilateral hilar and mediastinal nodal metastasis, subcarinal nodal metastasis, FDG avid bone lesions within the right iliac bone and L4 vertebral body compatible with osseous metastasis, and mild thickening with mild increased FDG uptake localizing to the left adrenal gland, equivocal for adrenal metastasis.   The patient was promptly referred to Dr. Julien Nordmann on 03/04/22 to discuss treatment options. During this visit, the patient endorsed intermittent chest tightness (typically lasts for few seconds and resolve spontaneously), shortness of breath with exertion, occasional cough with clear mucus (no hemoptysis), and a weight loss of around 40 pounds in the last 6 years after alcohol cessation. He otherwise denied any nausea, vomiting, diarrhea, or constipation.   Following discussion of the risks and benefits with Dr. Julien Nordmann, the patient consented to proceeding with chemotherapy consisting of Durvalumab, Carboplatin, Etoposide on 03/10/22. He completed his final 6th cycle on 05/14/22. Starting from cycle 5, the patient was started on maintenance treatment consisting of imfinzi.   MRI  of the brain on 03/11/22 showed no evidence of intracranial metastatic disease.   CT CAP on 04/19/22 demonstrated a mild interval response to therapy, demonstrated by: a mild decrease in size of the RLL pulmonary nodules, no  change to a slight decrease in size of mediastinal and right infrahilar adenopathy, and new faint sclerosis in the right L4 vertebral body and right iliac bone at the sites of previous hypermetabolism, compatible with a treatment effect. CT otherwise showed no new or progressive metastatic disease.   CT CAP on 05/27/22 showed stable nodal, pulmonary and osseous disease, and no new evidence of metastatic disease in the chest, abdomen, or pelvis.   CT CAP on 07/22/22 unfortunately showed interval enlargement of pretracheal, subcarinal, and right hilar lymph nodes, consistent with worsened nodal metastatic disease. Imaging also showed slight interval enlargement of peripheral RLL nodules, consistent with worsened primary lung malignancy and/or metastases. CT otherwise showed no change in the subtle sclerotic osseous lesions of the right aspect of L4 and of the right iliac wing, consistent with treated osseous metastatic disease, and no new sites of osseous metastases.   After discussing treatment options with Dr. Julien Nordmann, the patient has opted to continue imfinzi concurrent with palliative radiation. If he develops further disease progression, Dr. Julien Nordmann may consider changing him to a second line systemic chemotherapy regimen. The patient is agreeable to this.   I have personally reviewed his imaging.  Of note: the patient was hospitalized from 05/27/22 through 05/29/22 for management of severely uncontrolled type 2 diabetes. Prior to presentation, the patient was seen in the oncology clinic where labs were concerning for DKA. On presentation, he endorsed severe worsening fatigue since his last treatment 2 weeks prior, along with difficulty swallowing solid foods and subsequent limited oral intake. Hospital course included insulin infusions and he was discharged with insulin to take until chemotherapy completion. He later returned to the ED for management of hyperglycemia with resolved with IV fluid  hydration and sliding scale insulin.    He has some back and pelvic pain though not severe and both are longstanding for years, non-progressive.  PREVIOUS RADIATION THERAPY: No  PAST MEDICAL HISTORY:  has a past medical history of Anxiety, Arthritis, COPD (chronic obstructive pulmonary disease) ([place]), Dyspnea, Hyperlipidemia, Hypertension, Neuromuscular disorder ([place]), Paroxysmal atrial flutter ([place]) (01/15/2021), and Thoracoabdominal aneurysm ([place]).    PAST SURGICAL HISTORY: Past Surgical History:  Procedure Laterality Date   ABDOMINAL AORTIC ANEURYSM REPAIR  02/06/2020   BRONCHIAL BIOPSY  02/23/2022   Procedure: BRONCHIAL BIOPSIES;  Surgeon: Collene Gobble, MD;  Location: [facility] ENDOSCOPY;  Service: Pulmonary;;   BRONCHIAL BRUSHINGS  02/23/2022   Procedure: BRONCHIAL BRUSHINGS;  Surgeon: Collene Gobble, MD;  Location: [facility] ENDOSCOPY;  Service: Pulmonary;;   BRONCHIAL NEEDLE ASPIRATION BIOPSY  02/23/2022   Procedure: BRONCHIAL NEEDLE ASPIRATION BIOPSIES;  Surgeon: Collene Gobble, MD;  Location: [facility] ENDOSCOPY;  Service: Pulmonary;;   BROW LIFT Bilateral 12/11/2021   Procedure: UPPER LID BLEPHAROPLASTY;  Surgeon: Irene Limbo, MD;  Location: [place];  Service: Plastics;  Laterality: Bilateral;   CYST REMOVAL LEG Left 07/03/2021   Procedure: EXCISION CYST LEFT BUTTOCK;  Surgeon: Jovita Kussmaul, MD;  Location: [facility];  Service: General;  Laterality: Left;   EYE SURGERY Bilateral    cataract surgery   FINGER ARTHROPLASTY Left    left thumb-Dr. Amedeo Plenty   INGUINAL HERNIA REPAIR Left 07/03/2021   Procedure: LEFT INGUINAL HERNIA REPAIR WITH MESH;  Surgeon: Jovita Kussmaul, MD;  Location: [facility];  Service: General;  Laterality: Left;   INSERTION OF MESH N/A 07/03/2021   Procedure: INSERTION OF MESH X2;  Surgeon: Jovita Kussmaul, MD;  Location: [facility];  Service: General;  Laterality: N/A;   IR IMAGING GUIDED PORT INSERTION  03/12/2022   IR RADIOLOGIST EVAL & MGMT  10/25/2019   KNEE  ARTHROSCOPY Left    KNEE SURGERY Left    Bakers cyst x2   SHOULDER ARTHROSCOPY Right    thumb surgery     TONSILLECTOMY     TOTAL KNEE ARTHROPLASTY Left 03/19/2016   Procedure: LEFT TOTAL KNEE ARTHROPLASTY;  Surgeon: Sydnee Cabal, MD;  Location: [facility];  Service: Orthopedics;  Laterality: Left;   TOTAL SHOULDER REPLACEMENT Right    UMBILICAL HERNIA REPAIR N/A 07/03/2021   Procedure: UMBILICAL HERNIA REPAIR WITH MESH;  Surgeon: Jovita Kussmaul, MD;  Location: [facility];  Service: General;  Laterality: N/A;   VASCULAR SURGERY     VASECTOMY     VIDEO BRONCHOSCOPY WITH ENDOBRONCHIAL ULTRASOUND Right 02/23/2022   Procedure: VIDEO BRONCHOSCOPY WITH ENDOBRONCHIAL ULTRASOUND;  Surgeon: Collene Gobble, MD;  Location: [facility] ENDOSCOPY;  Service: Pulmonary;  Laterality: Right;   VIDEO BRONCHOSCOPY WITH RADIAL ENDOBRONCHIAL ULTRASOUND  02/23/2022   Procedure: VIDEO BRONCHOSCOPY WITH RADIAL ENDOBRONCHIAL ULTRASOUND;  Surgeon: Collene Gobble, MD;  Location: [facility] ENDOSCOPY;  Service: Pulmonary;;    FAMILY HISTORY: family history includes Breast cancer in his mother; Heart attack in his father; Macular degeneration in his mother; Ovarian cancer in his mother.  SOCIAL HISTORY:  reports that he quit smoking about 4 years ago. His smoking use included cigarettes. He has never used smokeless tobacco. He reports that he does not currently use alcohol. He reports that he does not use drugs.  ALLERGIES: Losartan, Atorvastatin calcium, and Pollen extract-tree extract [pollen extract]  MEDICATIONS:  Current Outpatient Medications  Medication Sig Dispense Refill   Alum Hydroxide-Mag Trisilicate (GAVISCON) 93-81.8 MG CHEW Chew 1 tablet by mouth daily as needed (heartburn).     apixaban (ELIQUIS) 5 MG TABS tablet Take 1 tablet (5 mg total) by mouth 2 (two) times daily. 180 tablet 0   atorvastatin (LIPITOR) 20 MG tablet Take 20 mg by mouth at bedtime.     blood glucose meter kit and supplies Dispense based on patient and  insurance preference. Use up to four times daily as directed. (FOR ICD-10 E10.9, E11.9). 1 each 0   clonazePAM (KLONOPIN) 1 MG tablet Take 1 tablet (1 mg total) by mouth 2 (two) times daily as needed for anxiety. 60 tablet 5   COMIRNATY SUSP injection      Continuous Blood Gluc Sensor (DEXCOM G7 SENSOR) MISC 1 Device by Does not apply route as directed. 9 each 3   fluticasone (FLONASE) 50 MCG/ACT nasal spray Place 1 spray into both nostrils 2 (two) times daily as needed for allergies.     insulin aspart (NOVOLOG FLEXPEN) 100 UNIT/ML FlexPen Max daily 30 units 15 mL 11   insulin glargine (LANTUS) 100 UNIT/ML Solostar Pen Inject 14 Units into the skin daily. 15 mL 11   Insulin Pen Needle 32G X 4 MM MISC 1 Device by Does not apply route in the morning, at noon, in the evening, and at bedtime. 400 each 3   Lancets (ONETOUCH DELICA PLUS [id number]) MISC Apply topically 4 (four) times daily.     lidocaine-prilocaine (EMLA) cream Apply to the Port-A-Cath site 30-60 minutes before chemotherapy 30 g 0   meloxicam (MOBIC) 15  MG tablet Take 15 mg by mouth daily as needed for pain.     Multiple Vitamin (MULTIVITAMIN WITH MINERALS) TABS tablet Take 1 tablet by mouth daily. 30 tablet 5   Naphazoline HCl (CLEAR EYES OP) Place 1 drop into both eyes daily.     ONETOUCH ULTRA test strip 1 each by Other route 3 (three) times daily. for testing 300 each 3   PROAIR HFA 108 (90 BASE) MCG/ACT inhaler Inhale 2 puffs into the lungs every 6 (six) hours as needed for wheezing or shortness of breath.   2   sildenafil (REVATIO) 20 MG tablet Take 60 mg by mouth daily as needed (ED).     Tiotropium Bromide-Olodaterol (STIOLTO RESPIMAT) 2.5-2.5 MCG/ACT AERS Inhale 2 puffs into the lungs daily. 4 g 0   traZODone (DESYREL) 50 MG tablet Take 50 mg by mouth at bedtime.     triamcinolone cream (KENALOG) 0.1 % Apply 1 Application topically daily.     No current facility-administered medications for this encounter.    REVIEW OF  SYSTEMS:  Notable for that above.   PHYSICAL EXAM:  vitals were not taken for this visit.   General: Alert and oriented, in no acute distress  HEENT: Head is normocephalic. Extraocular movements are intact.   Heart: Regular in rate and rhythm with no murmurs, rubs, or gallops. Chest: Clear to auscultation bilaterally, with no rhonchi, wheezes, or rales. Abdomen: Soft, nontender, nondistended, with no rigidity or guarding. Extremities: No cyanosis or edema. Lymphatics: see Neck Exam Skin: No concerning lesions. Musculoskeletal: symmetric strength and muscle tone throughout. Neurologic: Cranial nerves II through XII are grossly intact. No obvious focalities. Speech is fluent. Coordination is intact. Psychiatric: Judgment and insight are intact. Affect is appropriate.   ECOG = 1  0 - Asymptomatic (Fully active, able to carry on all predisease activities without restriction)  1 - Symptomatic but completely ambulatory (Restricted in physically strenuous activity but ambulatory and able to carry out work of a light or sedentary nature. For example, light housework, office work)  2 - Symptomatic, <50% in bed during the day (Ambulatory and capable of all self care but unable to carry out any work activities. Up and about more than 50% of waking hours)  3 - Symptomatic, >50% in bed, but not bedbound (Capable of only limited self-care, confined to bed or chair 50% or more of waking hours)  4 - Bedbound (Completely disabled. Cannot carry on any self-care. Totally confined to bed or chair)  5 - Death   Eustace Pen MM, Creech RH, Tormey DC, et al. [phone]). "Toxicity and response criteria of the [facility]". [place] Oncol. 5 (6): 649-55   LABORATORY DATA:  Lab Results  Component Value Date   WBC 7.3 07/28/2022   HGB 13.1 07/28/2022   HCT 38.3 (L) 07/28/2022   MCV 93.2 07/28/2022   PLT 152 07/28/2022   CMP     Component Value Date/Time   NA 134 (L) 07/28/2022 1412    NA 138 02/03/2021 0944   K 4.2 07/28/2022 1412   CL 101 07/28/2022 1412   CO2 27 07/28/2022 1412   GLUCOSE 320 (H) 07/28/2022 1412   BUN 16 07/28/2022 1412   BUN 13 02/03/2021 0944   CREATININE 0.94 07/28/2022 1412   CALCIUM 9.3 07/28/2022 1412   PROT 6.7 07/28/2022 1412   ALBUMIN 4.4 07/28/2022 1412   AST 22 07/28/2022 1412   ALT 19 07/28/2022 1412   ALKPHOS 50 07/28/2022 1412  BILITOT 0.5 07/28/2022 1412   GFRNONAA >60 07/28/2022 1412   GFRAA >60 03/20/2016 0444         RADIOGRAPHY: CT Chest W Contrast  Result Date: 07/25/2022 CLINICAL DATA:  Metastatic right lower lobe lung cancer restaging * Tracking Code: BO * EXAM: CT CHEST, ABDOMEN, AND PELVIS WITH CONTRAST TECHNIQUE: Multidetector CT imaging of the chest, abdomen and pelvis was performed following the standard protocol during bolus administration of intravenous contrast. RADIATION DOSE REDUCTION: This exam was performed according to the departmental dose-optimization program which includes automated exposure control, adjustment of the mA and/or kV according to patient size and/or use of iterative reconstruction technique. CONTRAST:  157m OMNIPAQUE IOHEXOL 300 MG/ML  SOLN COMPARISON:  05/27/2022 FINDINGS: CT CHEST FINDINGS Cardiovascular: Right chest port catheter. Aortic atherosclerosis. Normal heart size. Three-vessel coronary artery calcifications. No pericardial effusion. Mediastinum/Nodes: Interval enlargement of pretracheal, subcarinal, and right hilar lymph nodes, pretracheal nodes measuring 2.3 x 2.1 cm, previously 1.7 x 1.2 cm, subcarinal nodes measuring up to 2.9 x 2.4 cm, previously 2.0 x 1.8 cm (series 2, image 34). Thyroid gland, trachea, and esophagus demonstrate no significant findings. Lungs/Pleura: Moderate to severe centrilobular and paraseptal emphysema. Slight interval enlargement of nodules of the peripheral right lower lobe, larger measuring 1.8 x 0.8 cm, previously 1.6 x 0.5 cm (series 5, image 107), and 0.8  x 0.7 cm, previously 0.5 cm (series 5, image 97). No pleural effusion or pneumothorax. Musculoskeletal: No chest wall abnormality. No acute osseous findings. Status post right shoulder arthroplasty. CT ABDOMEN PELVIS FINDINGS Hepatobiliary: No solid liver abnormality is seen. No gallstones, gallbladder wall thickening, or biliary dilatation. Pancreas: Unremarkable. No pancreatic ductal dilatation or surrounding inflammatory changes. Spleen: Normal in size without significant abnormality. Adrenals/Urinary Tract: Adrenal glands are unremarkable. Kidneys are normal, without renal calculi, solid lesion, or hydronephrosis. Bladder is unremarkable. Stomach/Bowel: Stomach is within normal limits. Appendix is not clearly visualized. No evidence of bowel wall thickening, distention, or inflammatory changes. Large burden of stool, predominantly in the right colon. Descending and sigmoid diverticulosis. Vascular/Lymphatic: Aortic atherosclerosis. Unchanged appearance of aortobiiliac stent endograft repair with snorkel stenting of the abdominal branch vessels. No enlarged abdominal or pelvic lymph nodes. Reproductive: No mass or other abnormality. Other: No abdominal wall hernia or abnormality. No ascites. Musculoskeletal: No acute osseous findings. Unchanged very subtle sclerotic osseous lesions of the right aspect L4 (series 6, image 121) and of the right iliac wing (series 2, image 99). IMPRESSION: 1. Interval enlargement of pretracheal, subcarinal, and right hilar lymph nodes, consistent with worsened nodal metastatic disease. 2. Slight interval enlargement of nodules of the peripheral right lower lobe, consistent with worsened primary lung malignancy and/or metastases. 3. Unchanged very subtle sclerotic osseous lesions of the right aspect of L4 and of the right iliac wing, consistent with treated osseous metastatic disease. No obvious new osseous metastases. 4. Emphysema. 5. Coronary artery disease. 6. Unchanged appearance  of aortobiiliac stent endograft repair with snorkel stenting of the abdominal branch vessels. Aortic Atherosclerosis (ICD10-I70.0) and Emphysema (ICD10-J43.9). Electronically Signed   By: ADelanna AhmadiM.D.   On: 07/25/2022 23:06   CT Abdomen Pelvis W Contrast  Result Date: 07/25/2022 CLINICAL DATA:  Metastatic right lower lobe lung cancer restaging * Tracking Code: BO * EXAM: CT CHEST, ABDOMEN, AND PELVIS WITH CONTRAST TECHNIQUE: Multidetector CT imaging of the chest, abdomen and pelvis was performed following the standard protocol during bolus administration of intravenous contrast. RADIATION DOSE REDUCTION: This exam was performed according to the departmental dose-optimization program  which includes automated exposure control, adjustment of the mA and/or kV according to patient size and/or use of iterative reconstruction technique. CONTRAST:  123m OMNIPAQUE IOHEXOL 300 MG/ML  SOLN COMPARISON:  05/27/2022 FINDINGS: CT CHEST FINDINGS Cardiovascular: Right chest port catheter. Aortic atherosclerosis. Normal heart size. Three-vessel coronary artery calcifications. No pericardial effusion. Mediastinum/Nodes: Interval enlargement of pretracheal, subcarinal, and right hilar lymph nodes, pretracheal nodes measuring 2.3 x 2.1 cm, previously 1.7 x 1.2 cm, subcarinal nodes measuring up to 2.9 x 2.4 cm, previously 2.0 x 1.8 cm (series 2, image 34). Thyroid gland, trachea, and esophagus demonstrate no significant findings. Lungs/Pleura: Moderate to severe centrilobular and paraseptal emphysema. Slight interval enlargement of nodules of the peripheral right lower lobe, larger measuring 1.8 x 0.8 cm, previously 1.6 x 0.5 cm (series 5, image 107), and 0.8 x 0.7 cm, previously 0.5 cm (series 5, image 97). No pleural effusion or pneumothorax. Musculoskeletal: No chest wall abnormality. No acute osseous findings. Status post right shoulder arthroplasty. CT ABDOMEN PELVIS FINDINGS Hepatobiliary: No solid liver abnormality is  seen. No gallstones, gallbladder wall thickening, or biliary dilatation. Pancreas: Unremarkable. No pancreatic ductal dilatation or surrounding inflammatory changes. Spleen: Normal in size without significant abnormality. Adrenals/Urinary Tract: Adrenal glands are unremarkable. Kidneys are normal, without renal calculi, solid lesion, or hydronephrosis. Bladder is unremarkable. Stomach/Bowel: Stomach is within normal limits. Appendix is not clearly visualized. No evidence of bowel wall thickening, distention, or inflammatory changes. Large burden of stool, predominantly in the right colon. Descending and sigmoid diverticulosis. Vascular/Lymphatic: Aortic atherosclerosis. Unchanged appearance of aortobiiliac stent endograft repair with snorkel stenting of the abdominal branch vessels. No enlarged abdominal or pelvic lymph nodes. Reproductive: No mass or other abnormality. Other: No abdominal wall hernia or abnormality. No ascites. Musculoskeletal: No acute osseous findings. Unchanged very subtle sclerotic osseous lesions of the right aspect L4 (series 6, image 121) and of the right iliac wing (series 2, image 99). IMPRESSION: 1. Interval enlargement of pretracheal, subcarinal, and right hilar lymph nodes, consistent with worsened nodal metastatic disease. 2. Slight interval enlargement of nodules of the peripheral right lower lobe, consistent with worsened primary lung malignancy and/or metastases. 3. Unchanged very subtle sclerotic osseous lesions of the right aspect of L4 and of the right iliac wing, consistent with treated osseous metastatic disease. No obvious new osseous metastases. 4. Emphysema. 5. Coronary artery disease. 6. Unchanged appearance of aortobiiliac stent endograft repair with snorkel stenting of the abdominal branch vessels. Aortic Atherosclerosis (ICD10-I70.0) and Emphysema (ICD10-J43.9). Electronically Signed   By: ADelanna AhmadiM.D.   On: 07/25/2022 23:06      IMPRESSION/PLAN: Metastatic lung  cancer, small cell, with nodal disease in chest that has been refractory to systemic therapy.   Today, I talked to the patient about the findings and work-up thus far.  We discussed the patient's diagnosis of small cell lung cancer and general treatment for this, highlighting the role of radiotherapy to the thoracic nodal disease in the management.  We discussed the available radiation techniques, and focused on the details of logistics and delivery.     We discussed the risks, benefits, and side effects of a palliative course (2 weeks) of radiotherapy. Side effects may include but not necessarily be limited to: skin irritation, esophagitis, fatigue, rare injury to spinal cord, heart, and /or lungs.  No guarantees of treatment were given. A consent form was signed and placed in the patient's medical record. The patient was encouraged to ask questions that I answered to the best of my  ability.    He is eager to begin - we will schedule CT simulation soon.  On date of service, in total, I spent 50 minutes on this encounter. Patient was seen in person. Note was signed after encounter date -  minutes pertain to date of service only.   __________________________________________   Eppie Gibson, MD  This document serves as a record of services personally performed by Eppie Gibson, MD. It was created on her behalf by Roney Mans, a trained medical scribe. The creation of this record is based on the scribe's personal observations and the provider's statements to them. This document has been checked and approved by the attending provider.

## 2022-08-06 ENCOUNTER — Encounter: Payer: Self-pay | Admitting: Radiation Oncology

## 2022-08-06 DIAGNOSIS — Z87891 Personal history of nicotine dependence: Secondary | ICD-10-CM | POA: Diagnosis not present

## 2022-08-06 DIAGNOSIS — C3431 Malignant neoplasm of lower lobe, right bronchus or lung: Secondary | ICD-10-CM | POA: Diagnosis not present

## 2022-08-09 ENCOUNTER — Ambulatory Visit
Admission: RE | Admit: 2022-08-09 | Discharge: 2022-08-09 | Disposition: A | Discharge status: Home / Self Care | Payer: Medicare HMO | Source: Ambulatory Visit | Attending: Radiation Oncology | Admitting: Radiation Oncology

## 2022-08-09 ENCOUNTER — Other Ambulatory Visit: Payer: Self-pay

## 2022-08-09 DIAGNOSIS — C3431 Malignant neoplasm of lower lobe, right bronchus or lung: Secondary | ICD-10-CM | POA: Insufficient documentation

## 2022-08-09 DIAGNOSIS — Z510 Encounter for antineoplastic radiation therapy: Secondary | ICD-10-CM | POA: Diagnosis not present

## 2022-08-09 DIAGNOSIS — C779 Secondary and unspecified malignant neoplasm of lymph node, unspecified: Secondary | ICD-10-CM | POA: Insufficient documentation

## 2022-08-09 DIAGNOSIS — C7951 Secondary malignant neoplasm of bone: Secondary | ICD-10-CM | POA: Insufficient documentation

## 2022-08-09 DIAGNOSIS — Z87891 Personal history of nicotine dependence: Secondary | ICD-10-CM | POA: Insufficient documentation

## 2022-08-11 ENCOUNTER — Telehealth: Payer: Self-pay | Admitting: Nutrition

## 2022-08-11 ENCOUNTER — Encounter: Payer: Medicare HMO | Attending: Internal Medicine | Admitting: Nutrition

## 2022-08-11 DIAGNOSIS — Z87891 Personal history of nicotine dependence: Secondary | ICD-10-CM | POA: Diagnosis not present

## 2022-08-11 DIAGNOSIS — C779 Secondary and unspecified malignant neoplasm of lymph node, unspecified: Secondary | ICD-10-CM | POA: Diagnosis not present

## 2022-08-11 DIAGNOSIS — E1042 Type 1 diabetes mellitus with diabetic polyneuropathy: Secondary | ICD-10-CM | POA: Diagnosis not present

## 2022-08-11 DIAGNOSIS — Z510 Encounter for antineoplastic radiation therapy: Secondary | ICD-10-CM | POA: Diagnosis not present

## 2022-08-11 DIAGNOSIS — C7951 Secondary malignant neoplasm of bone: Secondary | ICD-10-CM | POA: Diagnosis not present

## 2022-08-11 DIAGNOSIS — C3431 Malignant neoplasm of lower lobe, right bronchus or lung: Secondary | ICD-10-CM | POA: Diagnosis not present

## 2022-08-11 NOTE — Telephone Encounter (Signed)
Patient is here today to start his Dexcom G7.  His phone does not support the app, so readings are going to a reader.   He was taking 14u of Lantus and FBSs were still in the 400s.  He saw his family doctor-DR. [name] 3 weeks, and she increased the dose to 20u q AM.  His FBSs are now in the 300s.  He is wanting to move to dose of Lantus to HS-9PM to see if this will help him get his blood sugars down in the morning.  HE is almost out.   also running out of Lantus and would like you to call in more, because he is almost out.  He has reduced the dose back to 12u last 2 weeks due to running low on Lantus  His meter would not download, so I copied readings and put them on your desk.

## 2022-08-11 NOTE — Patient Instructions (Addendum)
Stop the Ensure and switch to Glucerna Change sensor every 10 days Read over manual and call the help line if sensor falls off, or if questions on use.

## 2022-08-11 NOTE — Progress Notes (Signed)
Patient was trained on the [place].  His phone would not support the app.  He had downloaded the clarity app to his phone, thinking this was what he needed.  We gave him a reader and this was set with date/time and sensor was linked to this and started in his left arm.   He reports that his blood sugars were always in the 400s, and when he saw his family doctor, she upped his Lantus dose from 14u to 20 u q AM.  Says FBSs were down to the 300s, after increasing this dose.   He has sense been running out of insulin, and they will not refill his script for 2 more weeks, so has reduced his dose to 12u.  Meter could not be downloaded so readings were copied and put on Dr. Quin Hoop desk.  Patient suggested he move the Lantus to bedtime, the message was sent to DR. Shamleffer to see if this would help him.  He was told that someone would notify him in the AM if Dr. Kelton Pillar approves of this. We reviewed his diet, and he is eating 2 meals and snacking 2X/day.  He takes Novolog 10-12u ac both meals due to high bloods sugar readings before each meal at 10AM and 6PM.  Meals are low in carb and high in protein and fat.  Carbs are limited to no more than 15 acB to 30 acS.  He snacks on nuts during the afternoon and at bedtime.  Explained that the nuts will raise his blood sugar, and may require some novolog insulin.  He will check his Dexcom reader and see if this is the case, and call me next week with readings. He occasionally has an Ensure for a meal replacement, and was told to switch to Glucerna in stead.  He agreed to do this.  He had no final questions.

## 2022-08-12 ENCOUNTER — Other Ambulatory Visit: Payer: Self-pay | Admitting: Internal Medicine

## 2022-08-12 MED ORDER — INSULIN GLARGINE 100 UNIT/ML SOLOSTAR PEN: 24.0000 [IU] | PEN_INJECTOR | Freq: Every day | SUBCUTANEOUS | 3 refills | Status: DC

## 2022-08-12 NOTE — Telephone Encounter (Signed)
I received a message stating that the patient has high sugars, I was able to look at the glucose log left on my desk.   Please asked the patient to increase Lantus to 24 units daily from now on, and [facility]    Please make sure the patient takes NovoLog before each meal and he needs to use the correction scale that I provided him with before each meal   Please also verify the baseline dose of NovoLog that he has been taking with each meal ?    Thanks

## 2022-08-12 NOTE — Telephone Encounter (Signed)
Pt contacted and advised to increase Lantus to 24 units daily from now on, and [facility]. Please make sure the patient takes NovoLog before each meal and he needs to use the correction scale that I provided him with before each meal. Please also verify the baseline dose of NovoLog that he has been taking with each meal ? Pt confirmed he is taking 4 units before meals. Pt was reminded to reference correction scale provided.

## 2022-08-13 DIAGNOSIS — E109 Type 1 diabetes mellitus without complications: Secondary | ICD-10-CM | POA: Diagnosis not present

## 2022-08-16 ENCOUNTER — Other Ambulatory Visit: Payer: Self-pay

## 2022-08-16 ENCOUNTER — Ambulatory Visit
Admission: RE | Admit: 2022-08-16 | Discharge: 2022-08-16 | Disposition: A | Discharge status: Home / Self Care | Payer: Medicare HMO | Source: Ambulatory Visit | Attending: Radiation Oncology | Admitting: Radiation Oncology

## 2022-08-16 DIAGNOSIS — C3431 Malignant neoplasm of lower lobe, right bronchus or lung: Secondary | ICD-10-CM

## 2022-08-16 DIAGNOSIS — Z87891 Personal history of nicotine dependence: Secondary | ICD-10-CM | POA: Diagnosis not present

## 2022-08-16 DIAGNOSIS — Z510 Encounter for antineoplastic radiation therapy: Secondary | ICD-10-CM | POA: Diagnosis not present

## 2022-08-16 DIAGNOSIS — C779 Secondary and unspecified malignant neoplasm of lymph node, unspecified: Secondary | ICD-10-CM | POA: Diagnosis not present

## 2022-08-16 DIAGNOSIS — C7951 Secondary malignant neoplasm of bone: Secondary | ICD-10-CM | POA: Diagnosis not present

## 2022-08-16 LAB — RAD ONC ARIA SESSION SUMMARY
Course Elapsed Days: 0
Plan Fractions Treated to Date: 1
Plan Prescribed Dose Per Fraction: 3 Gy
Plan Total Fractions Prescribed: 10
Plan Total Prescribed Dose: 30 Gy
Reference Point Dosage Given to Date: 3 Gy
Reference Point Session Dosage Given: 3 Gy
Session Number: 1

## 2022-08-16 MED ORDER — SONAFINE EX EMUL
1.0000 | Freq: Once | CUTANEOUS | Status: AC
  Administered 2022-08-16: 1 via TOPICAL

## 2022-08-16 NOTE — Progress Notes (Signed)
Pt here for patient teaching. Pt given Radiation and You booklet, skin care instructions, and Sonafine. Reviewed areas of pertinence such as fatigue, hair loss, nausea and vomiting, skin changes, headache, breast tenderness, breast swelling, and shortness of breath. Pt able to give teach back of to pat skin, use unscented/gentle soap, and drink plenty of water, apply Sonafine bid, avoid applying anything to skin within 4 hours of treatment, and to use an electric razor if they must shave. Pt verbalizes understanding of information given and will contact nursing with any questions or concerns.     [email or web address]

## 2022-08-17 ENCOUNTER — Other Ambulatory Visit: Payer: Self-pay

## 2022-08-17 ENCOUNTER — Ambulatory Visit
Admission: RE | Admit: 2022-08-17 | Discharge: 2022-08-17 | Disposition: A | Discharge status: Home / Self Care | Payer: Medicare HMO | Source: Ambulatory Visit | Attending: Radiation Oncology | Admitting: Radiation Oncology

## 2022-08-17 DIAGNOSIS — C3431 Malignant neoplasm of lower lobe, right bronchus or lung: Secondary | ICD-10-CM | POA: Diagnosis not present

## 2022-08-17 DIAGNOSIS — Z87891 Personal history of nicotine dependence: Secondary | ICD-10-CM | POA: Diagnosis not present

## 2022-08-17 DIAGNOSIS — Z510 Encounter for antineoplastic radiation therapy: Secondary | ICD-10-CM | POA: Diagnosis not present

## 2022-08-17 DIAGNOSIS — C7951 Secondary malignant neoplasm of bone: Secondary | ICD-10-CM | POA: Diagnosis not present

## 2022-08-17 DIAGNOSIS — C779 Secondary and unspecified malignant neoplasm of lymph node, unspecified: Secondary | ICD-10-CM | POA: Diagnosis not present

## 2022-08-17 LAB — RAD ONC ARIA SESSION SUMMARY
Course Elapsed Days: 1
Plan Fractions Treated to Date: 2
Plan Prescribed Dose Per Fraction: 3 Gy
Plan Total Fractions Prescribed: 10
Plan Total Prescribed Dose: 30 Gy
Reference Point Dosage Given to Date: 6 Gy
Reference Point Session Dosage Given: 3 Gy
Session Number: 2

## 2022-08-18 ENCOUNTER — Other Ambulatory Visit: Payer: Self-pay

## 2022-08-18 ENCOUNTER — Ambulatory Visit
Admission: RE | Admit: 2022-08-18 | Discharge: 2022-08-18 | Disposition: A | Discharge status: Home / Self Care | Payer: Medicare HMO | Source: Ambulatory Visit | Attending: Radiation Oncology | Admitting: Radiation Oncology

## 2022-08-18 DIAGNOSIS — C779 Secondary and unspecified malignant neoplasm of lymph node, unspecified: Secondary | ICD-10-CM | POA: Diagnosis not present

## 2022-08-18 DIAGNOSIS — Z510 Encounter for antineoplastic radiation therapy: Secondary | ICD-10-CM | POA: Diagnosis not present

## 2022-08-18 DIAGNOSIS — C3431 Malignant neoplasm of lower lobe, right bronchus or lung: Secondary | ICD-10-CM | POA: Diagnosis not present

## 2022-08-18 DIAGNOSIS — C7951 Secondary malignant neoplasm of bone: Secondary | ICD-10-CM | POA: Diagnosis not present

## 2022-08-18 DIAGNOSIS — Z87891 Personal history of nicotine dependence: Secondary | ICD-10-CM | POA: Diagnosis not present

## 2022-08-18 LAB — RAD ONC ARIA SESSION SUMMARY
Course Elapsed Days: 2
Plan Fractions Treated to Date: 3
Plan Prescribed Dose Per Fraction: 3 Gy
Plan Total Fractions Prescribed: 10
Plan Total Prescribed Dose: 30 Gy
Reference Point Dosage Given to Date: 9 Gy
Reference Point Session Dosage Given: 3 Gy
Session Number: 3

## 2022-08-19 ENCOUNTER — Other Ambulatory Visit: Payer: Self-pay

## 2022-08-19 ENCOUNTER — Ambulatory Visit
Admission: RE | Admit: 2022-08-19 | Discharge: 2022-08-19 | Disposition: A | Discharge status: Home / Self Care | Payer: Medicare HMO | Source: Ambulatory Visit | Attending: Radiation Oncology | Admitting: Radiation Oncology

## 2022-08-19 DIAGNOSIS — C3431 Malignant neoplasm of lower lobe, right bronchus or lung: Secondary | ICD-10-CM | POA: Diagnosis not present

## 2022-08-19 DIAGNOSIS — C779 Secondary and unspecified malignant neoplasm of lymph node, unspecified: Secondary | ICD-10-CM | POA: Diagnosis not present

## 2022-08-19 DIAGNOSIS — C7951 Secondary malignant neoplasm of bone: Secondary | ICD-10-CM | POA: Diagnosis not present

## 2022-08-19 DIAGNOSIS — Z87891 Personal history of nicotine dependence: Secondary | ICD-10-CM | POA: Diagnosis not present

## 2022-08-19 DIAGNOSIS — Z510 Encounter for antineoplastic radiation therapy: Secondary | ICD-10-CM | POA: Diagnosis not present

## 2022-08-19 LAB — RAD ONC ARIA SESSION SUMMARY
Course Elapsed Days: 3
Plan Fractions Treated to Date: 4
Plan Prescribed Dose Per Fraction: 3 Gy
Plan Total Fractions Prescribed: 10
Plan Total Prescribed Dose: 30 Gy
Reference Point Dosage Given to Date: 12 Gy
Reference Point Session Dosage Given: 3 Gy
Session Number: 4

## 2022-08-20 ENCOUNTER — Ambulatory Visit
Admission: RE | Admit: 2022-08-20 | Discharge: 2022-08-20 | Disposition: A | Discharge status: Home / Self Care | Payer: Medicare HMO | Source: Ambulatory Visit | Attending: Radiation Oncology | Admitting: Radiation Oncology

## 2022-08-20 ENCOUNTER — Other Ambulatory Visit: Payer: Self-pay

## 2022-08-20 DIAGNOSIS — C7951 Secondary malignant neoplasm of bone: Secondary | ICD-10-CM | POA: Diagnosis not present

## 2022-08-20 DIAGNOSIS — Z87891 Personal history of nicotine dependence: Secondary | ICD-10-CM | POA: Diagnosis not present

## 2022-08-20 DIAGNOSIS — C779 Secondary and unspecified malignant neoplasm of lymph node, unspecified: Secondary | ICD-10-CM | POA: Diagnosis not present

## 2022-08-20 DIAGNOSIS — C3431 Malignant neoplasm of lower lobe, right bronchus or lung: Secondary | ICD-10-CM | POA: Diagnosis not present

## 2022-08-20 DIAGNOSIS — Z510 Encounter for antineoplastic radiation therapy: Secondary | ICD-10-CM | POA: Diagnosis not present

## 2022-08-20 LAB — RAD ONC ARIA SESSION SUMMARY
Course Elapsed Days: 4
Plan Fractions Treated to Date: 5
Plan Prescribed Dose Per Fraction: 3 Gy
Plan Total Fractions Prescribed: 10
Plan Total Prescribed Dose: 30 Gy
Reference Point Dosage Given to Date: 15 Gy
Reference Point Session Dosage Given: 3 Gy
Session Number: 5

## 2022-08-23 ENCOUNTER — Ambulatory Visit
Admission: RE | Admit: 2022-08-23 | Discharge: 2022-08-23 | Disposition: A | Discharge status: Home / Self Care | Payer: Medicare HMO | Source: Ambulatory Visit | Attending: Radiation Oncology | Admitting: Radiation Oncology

## 2022-08-23 ENCOUNTER — Other Ambulatory Visit: Payer: Self-pay

## 2022-08-23 ENCOUNTER — Other Ambulatory Visit: Payer: Self-pay | Admitting: Radiation Oncology

## 2022-08-23 DIAGNOSIS — Z87891 Personal history of nicotine dependence: Secondary | ICD-10-CM | POA: Diagnosis not present

## 2022-08-23 DIAGNOSIS — C3431 Malignant neoplasm of lower lobe, right bronchus or lung: Secondary | ICD-10-CM

## 2022-08-23 DIAGNOSIS — C7951 Secondary malignant neoplasm of bone: Secondary | ICD-10-CM | POA: Diagnosis not present

## 2022-08-23 DIAGNOSIS — Z510 Encounter for antineoplastic radiation therapy: Secondary | ICD-10-CM | POA: Diagnosis not present

## 2022-08-23 DIAGNOSIS — C779 Secondary and unspecified malignant neoplasm of lymph node, unspecified: Secondary | ICD-10-CM | POA: Diagnosis not present

## 2022-08-23 LAB — RAD ONC ARIA SESSION SUMMARY
Course Elapsed Days: 7
Plan Fractions Treated to Date: 6
Plan Prescribed Dose Per Fraction: 3 Gy
Plan Total Fractions Prescribed: 10
Plan Total Prescribed Dose: 30 Gy
Reference Point Dosage Given to Date: 18 Gy
Reference Point Session Dosage Given: 3 Gy
Session Number: 6

## 2022-08-23 MED ORDER — SONAFINE EX EMUL
1.0000 | Freq: Once | CUTANEOUS | Status: AC
  Administered 2022-08-23: 1 via TOPICAL

## 2022-08-23 MED ORDER — LIDOCAINE VISCOUS HCL 2 % MT SOLN: OROMUCOSAL | 3 refills | Status: DC

## 2022-08-24 ENCOUNTER — Other Ambulatory Visit: Payer: Self-pay

## 2022-08-24 ENCOUNTER — Ambulatory Visit
Admission: RE | Admit: 2022-08-24 | Discharge: 2022-08-24 | Disposition: A | Discharge status: Home / Self Care | Payer: Medicare HMO | Source: Ambulatory Visit | Attending: Radiation Oncology | Admitting: Radiation Oncology

## 2022-08-24 DIAGNOSIS — Z510 Encounter for antineoplastic radiation therapy: Secondary | ICD-10-CM | POA: Diagnosis not present

## 2022-08-24 DIAGNOSIS — C3431 Malignant neoplasm of lower lobe, right bronchus or lung: Secondary | ICD-10-CM | POA: Diagnosis not present

## 2022-08-24 DIAGNOSIS — Z87891 Personal history of nicotine dependence: Secondary | ICD-10-CM | POA: Diagnosis not present

## 2022-08-24 DIAGNOSIS — C7951 Secondary malignant neoplasm of bone: Secondary | ICD-10-CM | POA: Diagnosis not present

## 2022-08-24 DIAGNOSIS — C779 Secondary and unspecified malignant neoplasm of lymph node, unspecified: Secondary | ICD-10-CM | POA: Diagnosis not present

## 2022-08-24 LAB — RAD ONC ARIA SESSION SUMMARY
Course Elapsed Days: 8
Plan Fractions Treated to Date: 7
Plan Prescribed Dose Per Fraction: 3 Gy
Plan Total Fractions Prescribed: 10
Plan Total Prescribed Dose: 30 Gy
Reference Point Dosage Given to Date: 21 Gy
Reference Point Session Dosage Given: 3 Gy
Session Number: 7

## 2022-08-25 ENCOUNTER — Encounter: Payer: Self-pay | Admitting: Internal Medicine

## 2022-08-25 ENCOUNTER — Ambulatory Visit
Admission: RE | Admit: 2022-08-25 | Discharge: 2022-08-25 | Disposition: A | Discharge status: Home / Self Care | Payer: Medicare HMO | Source: Ambulatory Visit | Attending: Radiation Oncology | Admitting: Radiation Oncology

## 2022-08-25 ENCOUNTER — Inpatient Hospital Stay: Payer: Medicare HMO

## 2022-08-25 ENCOUNTER — Other Ambulatory Visit: Payer: Self-pay

## 2022-08-25 ENCOUNTER — Inpatient Hospital Stay: Payer: Medicare HMO | Attending: Internal Medicine | Admitting: Internal Medicine

## 2022-08-25 VITALS — BP 122/67 | HR 73 | Temp 98.5°F | Resp 19 | Wt 154.6 lb

## 2022-08-25 DIAGNOSIS — Z23 Encounter for immunization: Secondary | ICD-10-CM | POA: Insufficient documentation

## 2022-08-25 DIAGNOSIS — C3431 Malignant neoplasm of lower lobe, right bronchus or lung: Secondary | ICD-10-CM

## 2022-08-25 DIAGNOSIS — Z95828 Presence of other vascular implants and grafts: Secondary | ICD-10-CM

## 2022-08-25 DIAGNOSIS — Z5112 Encounter for antineoplastic immunotherapy: Secondary | ICD-10-CM | POA: Insufficient documentation

## 2022-08-25 DIAGNOSIS — C3490 Malignant neoplasm of unspecified part of unspecified bronchus or lung: Secondary | ICD-10-CM | POA: Diagnosis not present

## 2022-08-25 DIAGNOSIS — C7951 Secondary malignant neoplasm of bone: Secondary | ICD-10-CM | POA: Insufficient documentation

## 2022-08-25 DIAGNOSIS — Z79899 Other long term (current) drug therapy: Secondary | ICD-10-CM | POA: Insufficient documentation

## 2022-08-25 DIAGNOSIS — C779 Secondary and unspecified malignant neoplasm of lymph node, unspecified: Secondary | ICD-10-CM | POA: Diagnosis not present

## 2022-08-25 DIAGNOSIS — Z87891 Personal history of nicotine dependence: Secondary | ICD-10-CM | POA: Diagnosis not present

## 2022-08-25 DIAGNOSIS — Z510 Encounter for antineoplastic radiation therapy: Secondary | ICD-10-CM | POA: Diagnosis not present

## 2022-08-25 LAB — CMP (CANCER CENTER ONLY)
ALT: 15 U/L (ref 0–44)
AST: 17 U/L (ref 15–41)
Albumin: 3.8 g/dL (ref 3.5–5.0)
Alkaline Phosphatase: 57 U/L (ref 38–126)
Anion gap: 4 — ABNORMAL LOW (ref 5–15)
BUN: 20 mg/dL (ref 8–23)
CO2: 29 mmol/L (ref 22–32)
Calcium: 9.9 mg/dL (ref 8.9–10.3)
Chloride: 102 mmol/L (ref 98–111)
Creatinine: 0.97 mg/dL (ref 0.61–1.24)
GFR, Estimated: >60 mL/min (ref >60)
Glucose, Bld: 274 mg/dL — ABNORMAL HIGH (ref 70–99)
Potassium: 4.1 mmol/L (ref 3.5–5.1)
Sodium: 135 mmol/L (ref 135–145)
Total Bilirubin: 0.5 mg/dL (ref 0.3–1.2)
Total Protein: 6.7 g/dL (ref 6.5–8.1)

## 2022-08-25 LAB — RAD ONC ARIA SESSION SUMMARY
Course Elapsed Days: 9
Plan Fractions Treated to Date: 8
Plan Prescribed Dose Per Fraction: 3 Gy
Plan Total Fractions Prescribed: 10
Plan Total Prescribed Dose: 30 Gy
Reference Point Dosage Given to Date: 24 Gy
Reference Point Session Dosage Given: 3 Gy
Session Number: 8

## 2022-08-25 LAB — TSH: TSH: 1.221 u[IU]/mL (ref 0.350–4.500)

## 2022-08-25 LAB — CBC WITH DIFFERENTIAL (CANCER CENTER ONLY)
Abs Immature Granulocytes: 0.02 10*3/uL (ref 0.00–0.07)
Basophils Absolute: 0.1 10*3/uL (ref 0.0–0.1)
Basophils Relative: 1 %
Eosinophils Absolute: 0.1 10*3/uL (ref 0.0–0.5)
Eosinophils Relative: 2 %
HCT: 40.5 % (ref 39.0–52.0)
Hemoglobin: 13.7 g/dL (ref 13.0–17.0)
Immature Granulocytes: 0 %
Lymphocytes Relative: 13 %
Lymphs Abs: 0.8 10*3/uL (ref 0.7–4.0)
MCH: 31.1 pg (ref 26.0–34.0)
MCHC: 33.8 g/dL (ref 30.0–36.0)
MCV: 92 fL (ref 80.0–100.0)
Monocytes Absolute: 0.5 10*3/uL (ref 0.1–1.0)
Monocytes Relative: 8 %
Neutro Abs: 4.7 10*3/uL (ref 1.7–7.7)
Neutrophils Relative %: 76 %
Platelet Count: 138 10*3/uL — ABNORMAL LOW (ref 150–400)
RBC: 4.4 MIL/uL (ref 4.22–5.81)
RDW: 12.7 % (ref 11.5–15.5)
WBC Count: 6.3 10*3/uL (ref 4.0–10.5)
nRBC: 0 % (ref 0.0–0.2)

## 2022-08-25 MED ORDER — SODIUM CHLORIDE 0.9 % IV SOLN: Freq: Once | INTRAVENOUS | Status: AC

## 2022-08-25 MED ORDER — SODIUM CHLORIDE 0.9% FLUSH
10.0000 mL | INTRAVENOUS | Status: DC | PRN
  Administered 2022-08-25: 10 mL

## 2022-08-25 MED ORDER — INFLUENZA VAC A&B SA ADJ QUAD 0.5 ML IM PRSY
0.5000 mL | PREFILLED_SYRINGE | Freq: Once | INTRAMUSCULAR | Status: AC
  Administered 2022-08-25: 0.5 mL via INTRAMUSCULAR
  Filled 2022-08-25: qty 0.5

## 2022-08-25 MED ORDER — SODIUM CHLORIDE 0.9% FLUSH
10.0000 mL | Freq: Once | INTRAVENOUS | Status: AC
  Administered 2022-08-25: 10 mL

## 2022-08-25 MED ORDER — SODIUM CHLORIDE 0.9 % IV SOLN
1500.0000 mg | Freq: Once | INTRAVENOUS | Status: AC
  Administered 2022-08-25: 1500 mg via INTRAVENOUS
  Filled 2022-08-25: qty 30

## 2022-08-25 MED ORDER — HEPARIN SOD (PORK) LOCK FLUSH 100 UNIT/ML IV SOLN
500.0000 [IU] | Freq: Once | INTRAVENOUS | Status: AC | PRN
  Administered 2022-08-25: 500 [IU]

## 2022-08-25 NOTE — Patient Instructions (Signed)
[facility]  Discharge Instructions: Thank you for choosing [facility] to provide your oncology and hematology care.   If you have a lab appointment with the [facility], please go directly to the [facility] and check in at the registration area.   Wear comfortable clothing and clothing appropriate for easy access to any Portacath or PICC line.   We strive to give you quality time with your provider. You may need to reschedule your appointment if you arrive late (15 or more minutes).  Arriving late affects you and other patients whose appointments are after yours.  Also, if you miss three or more appointments without notifying the office, you may be dismissed from the clinic at the provider's discretion.      For prescription refill requests, have your pharmacy contact our office and allow 72 hours for refills to be completed.    Today you received the following chemotherapy and/or immunotherapy agents: Imfinzi       To help prevent nausea and vomiting after your treatment, we encourage you to take your nausea medication as directed.  BELOW ARE SYMPTOMS THAT SHOULD BE REPORTED IMMEDIATELY: *FEVER GREATER THAN 100.4 F (38 C) OR HIGHER *CHILLS OR SWEATING *NAUSEA AND VOMITING THAT IS NOT CONTROLLED WITH YOUR NAUSEA MEDICATION *UNUSUAL SHORTNESS OF BREATH *UNUSUAL BRUISING OR BLEEDING *URINARY PROBLEMS (pain or burning when urinating, or frequent urination) *BOWEL PROBLEMS (unusual diarrhea, constipation, pain near the anus) TENDERNESS IN MOUTH AND THROAT WITH OR WITHOUT PRESENCE OF ULCERS (sore throat, sores in mouth, or a toothache) UNUSUAL RASH, SWELLING OR PAIN  UNUSUAL VAGINAL DISCHARGE OR ITCHING   Items with * indicate a potential emergency and should be followed up as soon as possible or go to the Emergency Department if any problems should occur.  Please show the CHEMOTHERAPY ALERT CARD or IMMUNOTHERAPY ALERT CARD at  check-in to the Emergency Department and triage nurse.  Should you have questions after your visit or need to cancel or reschedule your appointment, please contact [facility]  Dept: [phone]  and follow the prompts.  Office hours are 8:00 a.m. to 4:30 p.m. Monday - Friday. Please note that voicemails left after 4:00 p.m. may not be returned until the following business day.  We are closed weekends and major holidays. You have access to a nurse at all times for urgent questions. Please call the main number to the clinic Dept: [phone] and follow the prompts.   For any non-urgent questions, you may also contact your provider using MyChart. We now offer e-Visits for anyone 28 and older to request care online for non-urgent symptoms. For details visit [email or web address].   Also download the MyChart app! Go to the app store, search "MyChart", open the app, select [facility], and log in with your MyChart username and password.

## 2022-08-25 NOTE — Progress Notes (Signed)
[facility] [phone]   [phone]  OFFICE PROGRESS NOTE  Adam Morning, DO [place] [id number]  DIAGNOSIS:  1) Extensive stage (T1c, N2, M1C) small cell lung cancer presented with right lower lobe lung nodule in addition to right hilar and mediastinal lymphadenopathy and bone metastasis involving thoracic spines as well as the right iliac crest diagnosed in July 2023. 2) immunotherapy mediated type 1 diabetes mellitus diagnosed in October 2023.  PRIOR THERAPY: Palliative radiotherapy to the enlarging mediastinal lymphadenopathy under the care of Dr. Isidore Moos.  CURRENT THERAPY: Systemic chemotherapy with carboplatin for AUC of 5 on day 1, etoposide 100 Mg/M2 on days 1, 2 and 3 with Imfinzi 1500 Mg IV on day 1 and Cosela 240 Mg/M2 on the days of the chemotherapy as well as Imfinzi 1500 Mg IV on day 1 every 3 weeks the first 4 cycles followed by maintenance treatment every 4 weeks starting from cycle #5.  First dose was giving 03/10/2022.  The patient status post 7 cycles.  Starting from cycle #5 the patient will be on maintenance treatment with Imfinzi 1500 Mg IV every 4 weeks.  INTERVAL HISTORY: FREAD KOTTKE 77 y.o. male returns to the clinic today for follow-up visit.  The patient is feeling fine today with no concerning complaints.  He is currently undergoing palliative radiotherapy to the enlarging mediastinal lymph nodes.  He is tolerating it fairly well except for fatigue.  He denied having any current chest pain, shortness of breath, cough or hemoptysis.  He has no nausea, vomiting, diarrhea or constipation.  He has no headache or visual changes.  He has no significant weight loss or night sweats.  The patient is here today for evaluation before starting cycle #8 of his treatment.   MEDICAL HISTORY: Past Medical History:  Diagnosis Date   Anxiety    Arthritis    osteoarthrititis- knees and most joints.   COPD  (chronic obstructive pulmonary disease) (HCC)    moderate -no regular use of inhalers- rare use of oxygen as sexual activity   Dyspnea    outside in hot weather and also with pollen   Hyperlipidemia    Hypertension    Neuromuscular disorder ([facility])    feet   Paroxysmal atrial flutter ([facility]) 01/15/2021   Thoracoabdominal aneurysm (HCC)    s/p FEVAR 4 Vessel TABME 02/19/20 Dr. Katy Apo    ALLERGIES:  is allergic to losartan, atorvastatin calcium, and pollen extract-tree extract [pollen extract].  MEDICATIONS:  Current Outpatient Medications  Medication Sig Dispense Refill   Alum Hydroxide-Mag Trisilicate (GAVISCON) 81-15.7 MG CHEW Chew 1 tablet by mouth daily as needed (heartburn).     apixaban (ELIQUIS) 5 MG TABS tablet Take 1 tablet (5 mg total) by mouth 2 (two) times daily. 180 tablet 0   atorvastatin (LIPITOR) 20 MG tablet Take 20 mg by mouth at bedtime.     blood glucose meter kit and supplies Dispense based on patient and insurance preference. Use up to four times daily as directed. (FOR ICD-10 E10.9, E11.9). 1 each 0   clonazePAM (KLONOPIN) 1 MG tablet Take 1 tablet (1 mg total) by mouth 2 (two) times daily as needed for anxiety. 60 tablet 5   COMIRNATY SUSP injection      Continuous Blood Gluc Sensor (DEXCOM G7 SENSOR) MISC 1 Device by Does not apply route as directed. 9 each 3   fluticasone (FLONASE) 50 MCG/ACT nasal spray Place 1 spray  into both nostrils 2 (two) times daily as needed for allergies.     insulin aspart (NOVOLOG FLEXPEN) 100 UNIT/ML FlexPen Max daily 30 units 15 mL 11   insulin glargine (LANTUS) 100 UNIT/ML Solostar Pen Inject 24 Units into the skin daily. 30 mL 3   Insulin Pen Needle 32G X 4 MM MISC 1 Device by Does not apply route in the [place], at noon, in the evening, and at bedtime. 400 each 3   Lancets (ONETOUCH DELICA PLUS [id number]) MISC Apply topically 4 (four) times daily.     lidocaine (XYLOCAINE) 2 % solution Patient: Mix 1part 2% viscous lidocaine,  1part H20. Swallow 65mL of diluted mixture, 7min before meals and at bedtime, up to QID 100 mL 3   lidocaine-prilocaine (EMLA) cream Apply to the Port-A-Cath site 30-60 minutes before chemotherapy (Patient not taking: Reported on 08/04/2022) 30 g 0   meloxicam (MOBIC) 15 MG tablet Take 15 mg by mouth daily as needed for pain.     Multiple Vitamin (MULTIVITAMIN WITH MINERALS) TABS tablet Take 1 tablet by mouth daily. 30 tablet 5   Naphazoline HCl (CLEAR EYES OP) Place 1 drop into both eyes daily.     ONETOUCH ULTRA test strip 1 each by Other route 3 (three) times daily. for testing 300 each 3   PROAIR HFA 108 (90 BASE) MCG/ACT inhaler Inhale 2 puffs into the lungs every 6 (six) hours as needed for wheezing or shortness of breath.   2   sildenafil (REVATIO) 20 MG tablet Take 60 mg by mouth daily as needed (ED).     Tiotropium Bromide-Olodaterol (STIOLTO RESPIMAT) 2.5-2.5 MCG/ACT AERS Inhale 2 puffs into the lungs daily. 4 g 0   traZODone (DESYREL) 50 MG tablet Take 50 mg by mouth at bedtime.     triamcinolone cream (KENALOG) 0.1 % Apply 1 Application topically daily.     No current facility-administered medications for this visit.    SURGICAL HISTORY:  Past Surgical History:  Procedure Laterality Date   ABDOMINAL AORTIC ANEURYSM REPAIR  02/06/2020   BRONCHIAL BIOPSY  02/23/2022   Procedure: BRONCHIAL BIOPSIES;  Surgeon: Collene Gobble, MD;  Location: [facility] ENDOSCOPY;  Service: Pulmonary;;   BRONCHIAL BRUSHINGS  02/23/2022   Procedure: BRONCHIAL BRUSHINGS;  Surgeon: Collene Gobble, MD;  Location: [facility] ENDOSCOPY;  Service: Pulmonary;;   BRONCHIAL NEEDLE ASPIRATION BIOPSY  02/23/2022   Procedure: BRONCHIAL NEEDLE ASPIRATION BIOPSIES;  Surgeon: Collene Gobble, MD;  Location: [facility] ENDOSCOPY;  Service: Pulmonary;;   BROW LIFT Bilateral 12/11/2021   Procedure: UPPER LID BLEPHAROPLASTY;  Surgeon: Irene Limbo, MD;  Location: [facility];  Service: Plastics;  Laterality: Bilateral;   CYST  REMOVAL LEG Left 07/03/2021   Procedure: EXCISION CYST LEFT BUTTOCK;  Surgeon: Jovita Kussmaul, MD;  Location: [facility];  Service: General;  Laterality: Left;   EYE SURGERY Bilateral    cataract surgery   FINGER ARTHROPLASTY Left    left thumb-Dr. Amedeo Plenty   INGUINAL HERNIA REPAIR Left 07/03/2021   Procedure: LEFT INGUINAL HERNIA REPAIR WITH MESH;  Surgeon: Jovita Kussmaul, MD;  Location: [facility];  Service: General;  Laterality: Left;   INSERTION OF MESH N/A 07/03/2021   Procedure: INSERTION OF MESH X2;  Surgeon: Jovita Kussmaul, MD;  Location: [facility];  Service: General;  Laterality: N/A;   IR IMAGING GUIDED PORT INSERTION  03/12/2022   IR RADIOLOGIST EVAL & MGMT  10/25/2019   KNEE ARTHROSCOPY Left    KNEE SURGERY Left  Bakers cyst x2   SHOULDER ARTHROSCOPY Right    thumb surgery     TONSILLECTOMY     TOTAL KNEE ARTHROPLASTY Left 03/19/2016   Procedure: LEFT TOTAL KNEE ARTHROPLASTY;  Surgeon: Sydnee Cabal, MD;  Location: [facility];  Service: Orthopedics;  Laterality: Left;   TOTAL SHOULDER REPLACEMENT Right    UMBILICAL HERNIA REPAIR N/A 07/03/2021   Procedure: UMBILICAL HERNIA REPAIR WITH MESH;  Surgeon: Jovita Kussmaul, MD;  Location: [facility];  Service: General;  Laterality: N/A;   VASCULAR SURGERY     VASECTOMY     VIDEO BRONCHOSCOPY WITH ENDOBRONCHIAL ULTRASOUND Right 02/23/2022   Procedure: VIDEO BRONCHOSCOPY WITH ENDOBRONCHIAL ULTRASOUND;  Surgeon: Collene Gobble, MD;  Location[facility] ENDOSCOPY;  Service: Pulmonary;  Laterality: Right;   VIDEO BRONCHOSCOPY WITH RADIAL ENDOBRONCHIAL ULTRASOUND  02/23/2022   Procedure: VIDEO BRONCHOSCOPY WITH RADIAL ENDOBRONCHIAL ULTRASOUND;  Surgeon: Collene Gobble, MD;  Location: [facility] ENDOSCOPY;  Service: Pulmonary;;    REVIEW OF SYSTEMS:  A comprehensive review of systems was negative except for: Constitutional: positive for fatigue   PHYSICAL EXAMINATION: General appearance: alert, cooperative, fatigued, and no distress Head: Normocephalic, without obvious  abnormality, atraumatic Neck: no adenopathy, no JVD, supple, symmetrical, trachea midline, and thyroid not enlarged, symmetric, no tenderness/mass/nodules Lymph nodes: Cervical, supraclavicular, and axillary nodes normal. Resp: clear to auscultation bilaterally Back: symmetric, no curvature. ROM normal. No CVA tenderness. Cardio: regular rate and rhythm, S1, S2 normal, no murmur, click, rub or gallop GI: soft, non-tender; bowel sounds normal; no masses,  no organomegaly Extremities: extremities normal, atraumatic, no cyanosis or edema  ECOG PERFORMANCE STATUS: 1 - Symptomatic but completely ambulatory  Blood pressure 122/67, pulse 73, temperature 98.5 F (36.9 C), temperature source Oral, resp. rate 19, weight 154 lb 9 oz (70.1 kg), SpO2 98 %.  LABORATORY DATA: Lab Results  Component Value Date   WBC 7.3 07/28/2022   HGB 13.1 07/28/2022   HCT 38.3 (L) 07/28/2022   MCV 93.2 07/28/2022   PLT 152 07/28/2022      Chemistry      Component Value Date/Time   NA 134 (L) 07/28/2022 1412   NA 138 02/03/2021 0944   K 4.2 07/28/2022 1412   CL 101 07/28/2022 1412   CO2 27 07/28/2022 1412   BUN 16 07/28/2022 1412   BUN 13 02/03/2021 0944   CREATININE 0.94 07/28/2022 1412      Component Value Date/Time   CALCIUM 9.3 07/28/2022 1412   ALKPHOS 50 07/28/2022 1412   AST 22 07/28/2022 1412   ALT 19 07/28/2022 1412   BILITOT 0.5 07/28/2022 1412       RADIOGRAPHIC STUDIES: No results found.  ASSESSMENT AND PLAN: This is a very pleasant 77 years old white male with extensive stage (T1c, N2, M1C) small cell lung cancer presented with right lower lobe lung nodule in addition to right hilar and mediastinal lymphadenopathy and bone metastasis involving thoracic spines as well as the right iliac crest diagnosed in July 2023. The patient started systemic chemotherapy with carboplatin for AUC of 5 on day 1, etoposide 100 Mg/M2 on days 1, 2 and 3 as well as Cosela 240 Mg/M2 on the days of the  chemotherapy and Imfinzi 1500 Mg IV every 3 weeks with the induction treatment.  He is status post 7 cycles.  Starting from cycle #5 the patient will be on maintenance treatment with Imfinzi 1500 Mg IV every 4 weeks. He is currently undergoing palliative radiotherapy to the enlarging mediastinal lymphadenopathy under  the care of Dr. Isidore Moos. The patient has been tolerating his treatment with immunotherapy fairly well.  He has some fatigue from the radiation. I recommended for him to proceed with cycle #8 of his treatment with Imfinzi today. I will see him back for follow-up visit in 4 weeks for evaluation with repeat CT scan of the chest, abdomen and pelvis for restaging of his disease. For the immunotherapy mediated type 1 diabetes mellitus, he will continue his evaluation and treatment under Dr. Kelton Pillar. The patient was advised to call immediately if he has any other concerning symptoms in the interval. The patient voices understanding of current disease status and treatment options and is in agreement with the current care plan.  All questions were answered. The patient knows to call the clinic with any problems, questions or concerns. We can certainly see the patient much sooner if necessary.  The total time spent in the appointment was 20 minutes.  Disclaimer: This note was dictated with voice recognition software. Similar sounding words can inadvertently be transcribed and may not be corrected upon review.

## 2022-08-25 NOTE — Addendum Note (Signed)
Addended by: Ardeen Garland on: 08/25/2022 09:26 AM   Modules accepted: Orders

## 2022-08-26 ENCOUNTER — Other Ambulatory Visit: Payer: Self-pay | Admitting: Internal Medicine

## 2022-08-26 ENCOUNTER — Other Ambulatory Visit: Payer: Self-pay

## 2022-08-26 ENCOUNTER — Ambulatory Visit
Admission: RE | Admit: 2022-08-26 | Discharge: 2022-08-26 | Disposition: A | Discharge status: Home / Self Care | Payer: Medicare HMO | Source: Ambulatory Visit | Attending: Radiation Oncology | Admitting: Radiation Oncology

## 2022-08-26 DIAGNOSIS — C779 Secondary and unspecified malignant neoplasm of lymph node, unspecified: Secondary | ICD-10-CM | POA: Diagnosis not present

## 2022-08-26 DIAGNOSIS — Z510 Encounter for antineoplastic radiation therapy: Secondary | ICD-10-CM | POA: Diagnosis not present

## 2022-08-26 DIAGNOSIS — C3431 Malignant neoplasm of lower lobe, right bronchus or lung: Secondary | ICD-10-CM | POA: Diagnosis not present

## 2022-08-26 DIAGNOSIS — Z87891 Personal history of nicotine dependence: Secondary | ICD-10-CM | POA: Diagnosis not present

## 2022-08-26 DIAGNOSIS — C7951 Secondary malignant neoplasm of bone: Secondary | ICD-10-CM | POA: Diagnosis not present

## 2022-08-26 LAB — RAD ONC ARIA SESSION SUMMARY
Course Elapsed Days: 10
Plan Fractions Treated to Date: 9
Plan Prescribed Dose Per Fraction: 3 Gy
Plan Total Fractions Prescribed: 10
Plan Total Prescribed Dose: 30 Gy
Reference Point Dosage Given to Date: 27 Gy
Reference Point Session Dosage Given: 3 Gy
Session Number: 9

## 2022-08-26 MED ORDER — OXYCODONE-ACETAMINOPHEN 5-325 MG PO TABS: 1.0000 | ORAL_TABLET | Freq: Three times a day (TID) | ORAL | 0 refills | Status: DC | PRN

## 2022-08-27 ENCOUNTER — Ambulatory Visit
Admission: RE | Admit: 2022-08-27 | Discharge: 2022-08-27 | Disposition: A | Discharge status: Home / Self Care | Payer: Medicare HMO | Source: Ambulatory Visit | Attending: Radiation Oncology | Admitting: Radiation Oncology

## 2022-08-27 ENCOUNTER — Other Ambulatory Visit: Payer: Self-pay

## 2022-08-27 DIAGNOSIS — C779 Secondary and unspecified malignant neoplasm of lymph node, unspecified: Secondary | ICD-10-CM | POA: Diagnosis not present

## 2022-08-27 DIAGNOSIS — C7951 Secondary malignant neoplasm of bone: Secondary | ICD-10-CM | POA: Diagnosis not present

## 2022-08-27 DIAGNOSIS — C3431 Malignant neoplasm of lower lobe, right bronchus or lung: Secondary | ICD-10-CM | POA: Diagnosis not present

## 2022-08-27 DIAGNOSIS — Z87891 Personal history of nicotine dependence: Secondary | ICD-10-CM | POA: Diagnosis not present

## 2022-08-27 DIAGNOSIS — Z510 Encounter for antineoplastic radiation therapy: Secondary | ICD-10-CM | POA: Diagnosis not present

## 2022-08-27 LAB — RAD ONC ARIA SESSION SUMMARY
Course Elapsed Days: 11
Plan Fractions Treated to Date: 10
Plan Prescribed Dose Per Fraction: 3 Gy
Plan Total Fractions Prescribed: 10
Plan Total Prescribed Dose: 30 Gy
Reference Point Dosage Given to Date: 30 Gy
Reference Point Session Dosage Given: 3 Gy
Session Number: 10

## 2022-08-27 NOTE — Radiation Completion Notes (Signed)
Patient Name: Adam Jordan, Adam Jordan MRN: [id number] Date of Birth: Dec 01, 1945 Referring Physician: Curt Bears, M.D. Date of Service: 2022-08-27 Radiation Oncologist: Eppie Gibson, M.D. [facility] END OF TREATMENT NOTE     Diagnosis: C34.31 Malignant neoplasm of lower lobe, right bronchus or lung Staging on 2022-03-04: Small cell carcinoma of lower lobe of right lung (HCC) T=cT1c, N=cN2, M=cM1c Intent: Palliative     ==========DELIVERED PLANS==========  First Treatment Date: 2022-08-16 - Last Treatment Date: 2022-08-27   Plan Name: Chest_nodes Site: Mediastinum Technique: 3D Mode: Photon Dose Per Fraction: 3 Gy Prescribed Dose (Delivered / Prescribed): 30 Gy / 30 Gy Prescribed Fxs (Delivered / Prescribed): 10 / 10     ==========ON TREATMENT VISIT DATES========== 2022-08-16, 2022-08-23     ==========UPCOMING VISITS==========       ==========APPENDIX - ON TREATMENT VISIT NOTES==========   See weekly On Treatment Notes is Epic for details.

## 2022-09-06 ENCOUNTER — Other Ambulatory Visit: Payer: Self-pay | Admitting: Internal Medicine

## 2022-09-06 ENCOUNTER — Telehealth: Payer: Self-pay | Admitting: Medical Oncology

## 2022-09-06 DIAGNOSIS — M12811 Other specific arthropathies, not elsewhere classified, right shoulder: Secondary | ICD-10-CM

## 2022-09-06 DIAGNOSIS — E099 Drug or chemical induced diabetes mellitus without complications: Secondary | ICD-10-CM | POA: Insufficient documentation

## 2022-09-06 DIAGNOSIS — C3431 Malignant neoplasm of lower lobe, right bronchus or lung: Secondary | ICD-10-CM

## 2022-09-06 HISTORY — DX: Other specific arthropathies, not elsewhere classified, right shoulder: M12.811

## 2022-09-06 HISTORY — DX: Malignant neoplasm of lower lobe, right bronchus or lung: C34.31

## 2022-09-06 HISTORY — DX: Drug or chemical induced diabetes mellitus without complications: E09.9

## 2022-09-06 MED ORDER — OXYCODONE-ACETAMINOPHEN 5-325 MG PO TABS: 1.0000 | ORAL_TABLET | Freq: Three times a day (TID) | ORAL | 0 refills | Status: DC | PRN

## 2022-09-06 NOTE — Telephone Encounter (Signed)
Requests refill for oxycodone . [facility].

## 2022-09-07 ENCOUNTER — Encounter: Payer: Self-pay | Admitting: Cardiology

## 2022-09-07 ENCOUNTER — Ambulatory Visit: Payer: Medicare HMO | Attending: Cardiology | Admitting: Cardiology

## 2022-09-07 VITALS — BP 120/68 | HR 60 | Ht 68.0 in | Wt 157.1 lb

## 2022-09-07 DIAGNOSIS — I716 Thoracoabdominal aortic aneurysm, without rupture, unspecified: Secondary | ICD-10-CM

## 2022-09-07 DIAGNOSIS — I10 Essential (primary) hypertension: Secondary | ICD-10-CM | POA: Diagnosis not present

## 2022-09-07 DIAGNOSIS — E1042 Type 1 diabetes mellitus with diabetic polyneuropathy: Secondary | ICD-10-CM | POA: Diagnosis not present

## 2022-09-07 DIAGNOSIS — I4892 Unspecified atrial flutter: Secondary | ICD-10-CM

## 2022-09-07 MED ORDER — NITROGLYCERIN 0.4 MG SL SUBL: 0.4000 mg | SUBLINGUAL_TABLET | SUBLINGUAL | 6 refills | Status: DC | PRN

## 2022-09-07 NOTE — Patient Instructions (Addendum)
Medication Instructions:   Nitroglycerin  Use nitroglycerin 1 tablet placed under the tongue at the first sign of chest pain or an angina attack. 1 tablet may be used every 5 minutes as needed, for up to 15 minutes. Do not take more than 3 tablets in 15 minutes. If pain persist call 911 or go to the nearest ED.     Lab Work: None Ordered If you have labs (blood work) drawn today and your tests are completely normal, you will receive your results only by: Between (if you have MyChart) OR A paper copy in the mail If you have any lab test that is abnormal or we need to change your treatment, we will call you to review the results.   Testing/Procedures: None Ordered   Follow-Up: At [facility], you and your health needs are our priority.  As part of our continuing mission to provide you with exceptional heart care, we have created designated Provider Care Teams.  These Care Teams include your primary Cardiologist (physician) and Advanced Practice Providers (APPs -  Physician Assistants and Nurse Practitioners) who all work together to provide you with the care you need, when you need it.  We recommend signing up for the patient portal called "MyChart".  Sign up information is provided on this After Visit Summary.  MyChart is used to connect with patients for Virtual Visits (Telemedicine).  Patients are able to view lab/test results, encounter notes, upcoming appointments, etc.  Non-urgent messages can be sent to your provider as well.   To learn more about what you can do with MyChart, go to [email or web address].    Your next appointment:   6 month(s)  The format for your next appointment:   In Person  Provider:   Jenne Campus, MD    Other Instructions NA   This visit was accompanied by Ellard Artis.

## 2022-09-07 NOTE — Progress Notes (Signed)
Cardiology Office Note:    Date:  09/07/2022   ID:  [name], [name] [birth year], MRN [id number]  PCP:  [name], DO  Cardiologist:  Jenne Campus, MD    Referring MD: [name], DO   Chief Complaint  Patient presents with   Follow-up  Cardiac wise doing fine  History of Present Illness:    Adam Jordan is a 77 y.o. male with past medical history significant for paroxysmal atrial flutter, there was incidental discovery when he was evaluated before surgery, COPD, he quit smoking year ago so, status post aortic aneurysm repair, he did have endoleak type II, also lung cancer treated with chemotherapy and radiation. Comes today to my office for follow-up.  He denies of any cardiac complaints no palpitation dizziness swelling of lower extremities but he does complain of having some chest pain does pain happen typically at rest tightness lasting for up to 20 seconds does not cough chest pain tightness squeezing when he walks and exercise on the regular basis  Past Medical History:  Diagnosis Date   Anxiety    Arthritis    osteoarthrititis- knees and most joints.   COPD (chronic obstructive pulmonary disease) (HCC)    moderate -no regular use of inhalers- rare use of oxygen as sexual activity   Dyspnea    outside in hot weather and also with pollen   Elevated blood-pressure reading, without diagnosis of hypertension 07/31/2019   Encounter for antineoplastic chemotherapy 03/04/2022   Encounter for antineoplastic immunotherapy 03/04/2022   History of heart artery stent 11/04/2021   Hypercholesterolemia 11/05/2013   Hyperlipidemia    Hypertension    Malaise and fatigue 04/19/2013   Neuromuscular disorder (HCC)    feet   Non-recurrent unilateral inguinal hernia without obstruction or gangrene 07/21/2021   Paroxysmal atrial flutter ([place]) 01/15/2021   Port-A-Cath in place 03/31/2022   Routine history and physical examination of adult 11/05/2013   Shoulder joint pain 08/23/2013    Status post lumbar microdiscectomy 07/31/2019   Thoracoabdominal aneurysm ([place])    s/p FEVAR 4 Vessel TABME 02/19/20 Dr. Katy Apo    Past Surgical History:  Procedure Laterality Date   ABDOMINAL AORTIC ANEURYSM REPAIR  02/06/2020   BRONCHIAL BIOPSY  02/23/2022   Procedure: BRONCHIAL BIOPSIES;  Surgeon: Collene Gobble, MD;  Location: [facility] ENDOSCOPY;  Service: Pulmonary;;   BRONCHIAL BRUSHINGS  02/23/2022   Procedure: BRONCHIAL BRUSHINGS;  Surgeon: Collene Gobble, MD;  Location: [facility] ENDOSCOPY;  Service: Pulmonary;;   BRONCHIAL NEEDLE ASPIRATION BIOPSY  02/23/2022   Procedure: BRONCHIAL NEEDLE ASPIRATION BIOPSIES;  Surgeon: Collene Gobble, MD;  Location: [place] ENDOSCOPY;  Service: Pulmonary;;   BROW LIFT Bilateral 12/11/2021   Procedure: UPPER LID BLEPHAROPLASTY;  Surgeon: Irene Limbo, MD;  Location: [place];  Service: Plastics;  Laterality: Bilateral;   CYST REMOVAL LEG Left 07/03/2021   Procedure: EXCISION CYST LEFT BUTTOCK;  Surgeon: Jovita Kussmaul, MD;  Location: [place];  Service: General;  Laterality: Left;   EYE SURGERY Bilateral    cataract surgery   FINGER ARTHROPLASTY Left    left thumb-Dr. Amedeo Plenty   INGUINAL HERNIA REPAIR Left 07/03/2021   Procedure: LEFT INGUINAL HERNIA REPAIR WITH MESH;  Surgeon: Jovita Kussmaul, MD;  Location: [place];  Service: General;  Laterality: Left;   INSERTION OF MESH N/A 07/03/2021   Procedure: INSERTION OF MESH X2;  Surgeon: Jovita Kussmaul, MD;  Location: [place];  Service: General;  Laterality: N/A;   IR IMAGING  GUIDED PORT INSERTION  03/12/2022   IR RADIOLOGIST EVAL & MGMT  10/25/2019   KNEE ARTHROSCOPY Left    KNEE SURGERY Left    Bakers cyst x2   SHOULDER ARTHROSCOPY Right    thumb surgery     TONSILLECTOMY     TOTAL KNEE ARTHROPLASTY Left 03/19/2016   Procedure: LEFT TOTAL KNEE ARTHROPLASTY;  Surgeon: Sydnee Cabal, MD;  Location: [facility];  Service: Orthopedics;  Laterality: Left;   TOTAL SHOULDER REPLACEMENT Right    UMBILICAL  HERNIA REPAIR N/A 07/03/2021   Procedure: UMBILICAL HERNIA REPAIR WITH MESH;  Surgeon: Jovita Kussmaul, MD;  Location: [place];  Service: General;  Laterality: N/A;   VASCULAR SURGERY     VASECTOMY     VIDEO BRONCHOSCOPY WITH ENDOBRONCHIAL ULTRASOUND Right 02/23/2022   Procedure: VIDEO BRONCHOSCOPY WITH ENDOBRONCHIAL ULTRASOUND;  Surgeon: Collene Gobble, MD;  Location: [facility] ENDOSCOPY;  Service: Pulmonary;  Laterality: Right;   VIDEO BRONCHOSCOPY WITH RADIAL ENDOBRONCHIAL ULTRASOUND  02/23/2022   Procedure: VIDEO BRONCHOSCOPY WITH RADIAL ENDOBRONCHIAL ULTRASOUND;  Surgeon: Collene Gobble, MD;  Location: [facility] ENDOSCOPY;  Service: Pulmonary;;    Current Medications: Current Meds  Medication Sig   Alum Hydroxide-Mag Trisilicate (GAVISCON) 75-10.2 MG CHEW Chew 1 tablet by mouth daily as needed (heartburn).   apixaban (ELIQUIS) 5 MG TABS tablet Take 1 tablet (5 mg total) by mouth 2 (two) times daily.   atorvastatin (LIPITOR) 20 MG tablet Take 20 mg by mouth at bedtime.   blood glucose meter kit and supplies Dispense based on patient and insurance preference. Use up to four times daily as directed. (FOR ICD-10 E10.9, E11.9). (Patient taking differently: 1 each by Other route See admin instructions. Dispense based on patient and insurance preference. Use up to four times daily as directed. (FOR ICD-10 E10.9, E11.9).)   clonazePAM (KLONOPIN) 1 MG tablet Take 1 tablet (1 mg total) by mouth 2 (two) times daily as needed for anxiety.   Continuous Blood Gluc Sensor (DEXCOM G7 SENSOR) MISC 1 Device by Does not apply route as directed.   fluticasone (FLONASE) 50 MCG/ACT nasal spray Place 1 spray into both nostrils 2 (two) times daily as needed for allergies.   insulin aspart (NOVOLOG FLEXPEN) 100 UNIT/ML FlexPen Max daily 30 units (Patient taking differently: Inject 1 Units into the skin 3 (three) times daily with meals. Max daily 30 units)   insulin glargine (LANTUS) 100 UNIT/ML Solostar Pen Inject 24 Units into the  skin daily.   Insulin Pen Needle 32G X 4 MM MISC 1 Device by Does not apply route in the morning, at noon, in the evening, and at bedtime.   Lancets (ONETOUCH DELICA PLUS [id number]) MISC Apply 1 each topically 4 (four) times daily.   lidocaine (XYLOCAINE) 2 % solution Patient: Mix 1part 2% viscous lidocaine, 1part H20. Swallow 39mL of diluted mixture, 49min before meals and at bedtime, up to QID (Patient taking differently: Use as directed 15 mLs in the mouth or throat See admin instructions. Patient: Mix 1part 2% viscous lidocaine, 1part H20. Swallow 69mL of diluted mixture, 44min before meals and at bedtime, up to QID)   lidocaine-prilocaine (EMLA) cream Apply to the Port-A-Cath site 30-60 minutes before chemotherapy (Patient taking differently: Apply 1 Application topically See admin instructions. Apply to the Port-A-Cath site 30-60 minutes before chemotherapy)   meloxicam (MOBIC) 15 MG tablet Take 15 mg by mouth daily as needed for pain.   Multiple Vitamin (MULTIVITAMIN WITH MINERALS) TABS tablet Take 1 tablet by mouth daily.  Naphazoline HCl (CLEAR EYES OP) Place 1 drop into both eyes daily.   ONETOUCH ULTRA test strip 1 each by Other route 3 (three) times daily. for testing   oxyCODONE-acetaminophen (PERCOCET/ROXICET) 5-325 MG tablet Take 1 tablet by mouth every 8 (eight) hours as needed for severe pain.   PROAIR HFA 108 (90 BASE) MCG/ACT inhaler Inhale 2 puffs into the lungs every 6 (six) hours as needed for wheezing or shortness of breath.    sildenafil (REVATIO) 20 MG tablet Take 60 mg by mouth daily as needed (ED).   Tiotropium Bromide-Olodaterol (STIOLTO RESPIMAT) 2.5-2.5 MCG/ACT AERS Inhale 2 puffs into the lungs daily.   traZODone (DESYREL) 50 MG tablet Take 50 mg by mouth at bedtime.   triamcinolone cream (KENALOG) 0.1 % Apply 1 Application topically daily.     Allergies:   Losartan, Atorvastatin calcium, and Pollen extract-tree extract [pollen extract]   Social History    Socioeconomic History   Marital status: Married    Spouse name: Not on file   Number of children: Not on file   Years of education: Not on file   Highest education level: Not on file  Occupational History   Not on file  Tobacco Use   Smoking status: Former    Years: 50.00    Types: Cigarettes    Quit date: 2020    Years since quitting: 4.1   Smokeless tobacco: Never   Tobacco comments:    Quit 2 months ago 02/17/22 [facility]   Vaping Use   Vaping Use: Never used  Substance and Sexual Activity   Alcohol use: Not Currently   Drug use: No   Sexual activity: Yes  Other Topics Concern   Not on file  Social History Narrative   Not on file   Social Determinants of Health   Financial Resource Strain: Not on file  Food Insecurity: No Food Insecurity (05/27/2022)   Hunger Vital Sign    Worried About Running Out of Food in the Last Year: Never true    Ran Out of Food in the Last Year: Never true  Transportation Needs: Unmet Transportation Needs (05/27/2022)   PRAPARE - Hydrologist (Medical): Yes    Lack of Transportation (Non-Medical): Yes  Physical Activity: Not on file  Stress: Not on file  Social Connections: Not on file     Family History: The patient's family history includes Breast cancer in his mother; Heart attack in his father; Macular degeneration in his mother; Ovarian cancer in his mother. ROS:   Please see the history of present illness.    All 14 point review of systems negative except as described per history of present illness  EKGs/Labs/Other Studies Reviewed:      Recent Labs: 05/29/2022: Magnesium 1.8 08/25/2022: ALT 15; BUN 20; Creatinine 0.97; Hemoglobin 13.7; Platelet Count 138; Potassium 4.1; Sodium 135; TSH 1.221  Recent Lipid Panel No results found for: "CHOL", "TRIG", "HDL", "CHOLHDL", "VLDL", "LDLCALC", "LDLDIRECT"  Physical Exam:    VS:  BP 120/68 (BP Location: Left Arm, Patient Position: Sitting)   Pulse 60    Ht 5\' 8"  (1.727 m)   Wt 157 lb 1.3 oz (71.3 kg)   SpO2 96%   BMI 23.88 kg/m     Wt Readings from Last 3 Encounters:  09/07/22 157 lb 1.3 oz (71.3 kg)  08/25/22 154 lb 9 oz (70.1 kg)  08/04/22 156 lb 6.4 oz (70.9 kg)     GEN:  Well nourished, well developed  in no acute distress HEENT: Normal NECK: No JVD; No carotid bruits LYMPHATICS: No lymphadenopathy CARDIAC: RRR, no murmurs, no rubs, no gallops RESPIRATORY:  Clear to auscultation without rales, wheezing or rhonchi  ABDOMEN: Soft, non-tender, non-distended MUSCULOSKELETAL:  No edema; No deformity  SKIN: Warm and dry LOWER EXTREMITIES: no swelling NEUROLOGIC:  Alert and oriented x 3 PSYCHIATRIC:  Normal affect   ASSESSMENT:    1. Paroxysmal atrial flutter ([place])   2. Essential hypertension   3. Thoracoabdominal aortic aneurysm (TAAA) without rupture, unspecified part ([place])   4. Type 1 diabetes mellitus with diabetic polyneuropathy (HCC)    PLAN:    In order of problems listed above:  Paroxysmal atrial flutter is maintained sinus rhythm he is anticoagulated which I will continue. Essential hypertension blood pressure well-controlled continue present management. Status post abdominal arctic aneurysm repair that being follow-up by vascular surgeon. Dyslipidemia I did review his K PN however I have data from 2022.  Will call for more updated information about his dyslipidemia. Smoking likely he quit.    Medication Adjustments/Labs and Tests Ordered: Current medicines are reviewed at length with the patient today.  Concerns regarding medicines are outlined above.  No orders of the defined types were placed in this encounter.  Medication changes: No orders of the defined types were placed in this encounter.   Signed, Park Liter, MD, [facility] 09/07/2022 2:03 PM    [place]

## 2022-09-10 ENCOUNTER — Other Ambulatory Visit: Payer: Self-pay

## 2022-09-13 DIAGNOSIS — E109 Type 1 diabetes mellitus without complications: Secondary | ICD-10-CM | POA: Diagnosis not present

## 2022-09-16 ENCOUNTER — Encounter: Payer: Self-pay | Admitting: Internal Medicine

## 2022-09-16 ENCOUNTER — Ambulatory Visit (INDEPENDENT_AMBULATORY_CARE_PROVIDER_SITE_OTHER): Payer: Medicare HMO | Admitting: Internal Medicine

## 2022-09-16 VITALS — BP 124/80 | HR 81 | Ht 68.0 in | Wt 156.0 lb

## 2022-09-16 DIAGNOSIS — E1042 Type 1 diabetes mellitus with diabetic polyneuropathy: Secondary | ICD-10-CM | POA: Diagnosis not present

## 2022-09-16 DIAGNOSIS — E1065 Type 1 diabetes mellitus with hyperglycemia: Secondary | ICD-10-CM

## 2022-09-16 HISTORY — DX: Type 1 diabetes mellitus with hyperglycemia: E10.65

## 2022-09-16 LAB — POCT GLYCOSYLATED HEMOGLOBIN (HGB A1C): Hemoglobin A1C: 9.8 % — AB (ref 4.0–5.6)

## 2022-09-16 NOTE — Patient Instructions (Addendum)
Continue  Lantus 24 units daily  Increase Novolog 8 units with each meal  Novolog correctional insulin: ADD extra units on insulin to your meal-time Humalog dose if your blood sugars are higher than 165. Use the scale below to help guide you:   Blood sugar before meal Number of units to inject  Less than 165 0 unit  166- 200 1 units  201- 235 2 units  236- 270 3 units  271- 305 4 units  306- 340 5 units  341 - 375 6 units     HOW TO TREAT LOW BLOOD SUGARS (Blood sugar LESS THAN 70 MG/DL) Please follow the RULE OF 15 for the treatment of hypoglycemia treatment (when your (blood sugars are less than 70 mg/dL)   STEP 1: Take 15 grams of carbohydrates when your blood sugar is low, which includes:  3-4 GLUCOSE TABS  OR 3-4 OZ OF JUICE OR REGULAR SODA OR ONE TUBE OF GLUCOSE GEL    STEP 2: RECHECK blood sugar in 15 MINUTES STEP 3: If your blood sugar is still low at the 15 minute recheck --> then, go back to STEP 1 and treat AGAIN with another 15 grams of carbohydrates.

## 2022-09-16 NOTE — Progress Notes (Signed)
Name: Adam Jordan  MRN/ DOB: [id number], 05/09/1946   Age/ Sex: 77 y.o., male    PCP: [name], DO   Reason for Endocrinology Evaluation:  Diabetes Mellitus     Date of Initial Endocrinology Visit: 06/09/2022    PATIENT IDENTIFIER: Adam Jordan is a 77 y.o. male with a past medical history of DM, COPD, HTN , Dyslipidemia , small lung ca. (Dx 01/2022). The patient presented for initial endocrinology clinic visit on 06/09/2022 for consultative assistance with his diabetes management.    HPI: Adam Jordan was    Diagnosed with DM 05/2022 Patient was diagnosed with immune mediated diabetes mellitus through oncology while being treated for small cell lung cancer while on chemotherapy   Hemoglobin A1c 10.1%  in 05/2022.   SUBJECTIVE:   During the last visit (06/09/2022): A1c 10.1%   Today (09/16/22):Adam Jordan is here for a follow up on diabetes management.   He checks his blood sugars multiple  times daily. The patient has  had hypoglycemic episodes since the last clinic visit, which typically occur at night   He had a follow up with cardiology 09/07/2022 Completed radiation treatment 08/27/2022 for lung ca. Continues on Imfinzi infusions   Denies vomiting or constipation or diarrhea  Patient endorses tingling of the feet in the past  HOME DIABETES REGIMEN: Lantus 24 units daily Novolog 4 units TIDQAC - takes 5-6 units CF : Novolog (BG-130/50) - not using       Statin: yes ACE-I/ARB: No - anaphylactic to Losartan Prior Diabetic Education: no   METER DOWNLOAD SUMMARY: just started using it last night     DIABETIC COMPLICATIONS: Microvascular complications:  He has chronic neuropathy prior to diagnosis of diabetes Denies: CKD  Last eye exam:  Macrovascular complications:   Denies: CAD, PVD, CVA   PAST HISTORY: Past Medical History:  Past Medical History:  Diagnosis Date   Anxiety    Arthritis    osteoarthrititis- knees and most joints.   COPD  (chronic obstructive pulmonary disease) (HCC)    moderate -no regular use of inhalers- rare use of oxygen as sexual activity   Dyspnea    outside in hot weather and also with pollen   Elevated blood-pressure reading, without diagnosis of hypertension 07/31/2019   Encounter for antineoplastic chemotherapy 03/04/2022   Encounter for antineoplastic immunotherapy 03/04/2022   History of heart artery stent 11/04/2021   Hypercholesterolemia 11/05/2013   Hyperlipidemia    Hypertension    Malaise and fatigue 04/19/2013   Neuromuscular disorder (HCC)    feet   Non-recurrent unilateral inguinal hernia without obstruction or gangrene 07/21/2021   Paroxysmal atrial flutter ([place]) 01/15/2021   Port-A-Cath in place 03/31/2022   Routine history and physical examination of adult 11/05/2013   Shoulder joint pain 08/23/2013   Status post lumbar microdiscectomy 07/31/2019   Thoracoabdominal aneurysm [facility])    s/p FEVAR 4 Vessel TABME 02/19/20 Dr. Katy Apo   Past Surgical History:  Past Surgical History:  Procedure Laterality Date   ABDOMINAL AORTIC ANEURYSM REPAIR  02/06/2020   BRONCHIAL BIOPSY  02/23/2022   Procedure: BRONCHIAL BIOPSIES;  Surgeon: Collene Gobble, MD;  Location: [facility] ENDOSCOPY;  Service: Pulmonary;;   BRONCHIAL BRUSHINGS  02/23/2022   Procedure: BRONCHIAL BRUSHINGS;  Surgeon: Collene Gobble, MD;  Location: [facility] ENDOSCOPY;  Service: Pulmonary;;   BRONCHIAL NEEDLE ASPIRATION BIOPSY  02/23/2022   Procedure: BRONCHIAL NEEDLE ASPIRATION BIOPSIES;  Surgeon: Collene Gobble, MD;  Location: [place] ENDOSCOPY;  Service: Pulmonary;;  BROW LIFT Bilateral 12/11/2021   Procedure: UPPER LID BLEPHAROPLASTY;  Surgeon: Irene Limbo, MD;  Location: [facility];  Service: Plastics;  Laterality: Bilateral;   CYST REMOVAL LEG Left 07/03/2021   Procedure: EXCISION CYST LEFT BUTTOCK;  Surgeon: Jovita Kussmaul, MD;  Location: [place];  Service: General;  Laterality: Left;   EYE SURGERY Bilateral    cataract  surgery   FINGER ARTHROPLASTY Left    left thumb-Dr. Amedeo Plenty   INGUINAL HERNIA REPAIR Left 07/03/2021   Procedure: LEFT INGUINAL HERNIA REPAIR WITH MESH;  Surgeon: Jovita Kussmaul, MD;  Location: [place];  Service: General;  Laterality: Left;   INSERTION OF MESH N/A 07/03/2021   Procedure: INSERTION OF MESH X2;  Surgeon: Jovita Kussmaul, MD;  Location: [place];  Service: General;  Laterality: N/A;   IR IMAGING GUIDED PORT INSERTION  03/12/2022   IR RADIOLOGIST EVAL & MGMT  10/25/2019   KNEE ARTHROSCOPY Left    KNEE SURGERY Left    Bakers cyst x2   SHOULDER ARTHROSCOPY Right    thumb surgery     TONSILLECTOMY     TOTAL KNEE ARTHROPLASTY Left 03/19/2016   Procedure: LEFT TOTAL KNEE ARTHROPLASTY;  Surgeon: Sydnee Cabal, MD;  Location[facility];  Service: Orthopedics;  Laterality: Left;   TOTAL SHOULDER REPLACEMENT Right    UMBILICAL HERNIA REPAIR N/A 07/03/2021   Procedure: UMBILICAL HERNIA REPAIR WITH MESH;  Surgeon: Jovita Kussmaul, MD;  Location: [place];  Service: General;  Laterality: N/A;   VASCULAR SURGERY     VASECTOMY     VIDEO BRONCHOSCOPY WITH ENDOBRONCHIAL ULTRASOUND Right 02/23/2022   Procedure: VIDEO BRONCHOSCOPY WITH ENDOBRONCHIAL ULTRASOUND;  Surgeon: Collene Gobble, MD;  Location: [facility] ENDOSCOPY;  Service: Pulmonary;  Laterality: Right;   VIDEO BRONCHOSCOPY WITH RADIAL ENDOBRONCHIAL ULTRASOUND  02/23/2022   Procedure: VIDEO BRONCHOSCOPY WITH RADIAL ENDOBRONCHIAL ULTRASOUND;  Surgeon: Collene Gobble, MD;  Location: [place] ENDOSCOPY;  Service: Pulmonary;;    Social History:  reports that he quit smoking about 4 years ago. His smoking use included cigarettes. He has never used smokeless tobacco. He reports that he does not currently use alcohol. He reports that he does not use drugs. Family History:  Family History  Problem Relation Age of Onset   Ovarian cancer Mother    Breast cancer Mother    Macular degeneration Mother    Heart attack Father      HOME MEDICATIONS: Allergies as of  09/16/2022       Reactions   Losartan Anaphylaxis   Atorvastatin Calcium    Causes agitation at 40 mg, tolerates 20 mg   Pollen Extract-tree Extract [pollen Extract]         Medication List        Accurate as of September 16, 2022 11:37 AM. If you have any questions, ask your nurse or doctor.          apixaban 5 MG Tabs tablet Commonly known as: ELIQUIS Take 1 tablet (5 mg total) by mouth 2 (two) times daily.   atorvastatin 20 MG tablet Commonly known as: LIPITOR Take 20 mg by mouth at bedtime.   blood glucose meter kit and supplies Dispense based on patient and insurance preference. Use up to four times daily as directed. (FOR ICD-10 E10.9, E11.9). What changed:  how much to take how to take this when to take this   CLEAR EYES OP Place 1 drop into both eyes daily.   clonazePAM 1 MG tablet  Commonly known as: KLONOPIN Take 1 tablet (1 mg total) by mouth 2 (two) times daily as needed for anxiety.   Dexcom G7 Sensor Misc 1 Device by Does not apply route as directed.   fluticasone 50 MCG/ACT nasal spray Commonly known as: FLONASE Place 1 spray into both nostrils 2 (two) times daily as needed for allergies.   Gaviscon 80-14.2 MG Chew Generic drug: Alum Hydroxide-Mag Trisilicate Chew 1 tablet by mouth daily as needed (heartburn).   insulin glargine 100 UNIT/ML Solostar Pen Commonly known as: LANTUS Inject 24 Units into the skin daily.   Insulin Pen Needle 32G X 4 MM Misc 1 Device by Does not apply route in the morning, at noon, in the evening, and at bedtime.   lidocaine 2 % solution Commonly known as: XYLOCAINE Patient: Mix 1part 2% viscous lidocaine, 1part H20. Swallow 63mL of diluted mixture, 2min before meals and at bedtime, up to QID What changed:  how much to take how to take this when to take this   lidocaine-prilocaine cream Commonly known as: EMLA Apply to the Port-A-Cath site 30-60 minutes before chemotherapy What changed:  how much to  take how to take this when to take this   meloxicam 15 MG tablet Commonly known as: MOBIC Take 15 mg by mouth daily as needed for pain.   multivitamin with minerals Tabs tablet Take 1 tablet by mouth daily.   nitroGLYCERIN 0.4 MG SL tablet Commonly known as: NITROSTAT Place 1 tablet (0.4 mg total) under the tongue every 5 (five) minutes as needed for chest pain.   NovoLOG FlexPen 100 UNIT/ML FlexPen Generic drug: insulin aspart Max daily 30 units What changed:  how much to take how to take this when to take this   OneTouch Delica Plus [id number] Misc Apply 1 each topically 4 (four) times daily.   OneTouch Ultra test strip Generic drug: glucose blood 1 each by Other route 3 (three) times daily. for testing   oxyCODONE-acetaminophen 5-325 MG tablet Commonly known as: PERCOCET/ROXICET Take 1 tablet by mouth every 8 (eight) hours as needed for severe pain.   ProAir HFA 108 (90 Base) MCG/ACT inhaler Generic drug: albuterol Inhale 2 puffs into the lungs every 6 (six) hours as needed for wheezing or shortness of breath.   sildenafil 20 MG tablet Commonly known as: REVATIO Take 60 mg by mouth daily as needed (ED).   Stiolto Respimat 2.5-2.5 MCG/ACT Aers Generic drug: Tiotropium Bromide-Olodaterol Inhale 2 puffs into the lungs daily.   traZODone 50 MG tablet Commonly known as: DESYREL Take 50 mg by mouth at bedtime.   triamcinolone cream 0.1 % Commonly known as: KENALOG Apply 1 Application topically daily.         ALLERGIES: Allergies  Allergen Reactions   Losartan Anaphylaxis   Atorvastatin Calcium     Causes agitation at 40 mg, tolerates 20 mg   Pollen Extract-Tree Extract [Pollen Extract]      REVIEW OF SYSTEMS: A comprehensive ROS was conducted with the patient and is negative except as per HPI     OBJECTIVE:   VITAL SIGNS: BP 124/80 (BP Location: Left Arm, Patient Position: Sitting, Cuff Size: Large)   Pulse 81   Ht 5\' 8"  (1.727 m)   Wt 156 lb  (70.8 kg)   SpO2 94%   BMI 23.72 kg/m    PHYSICAL EXAM:  General: Pt appears well and is in NAD  Neck: General: Supple without adenopathy or carotid bruits. Thyroid: Thyroid size normal.  No goiter  or nodules appreciated.   Lungs: Clear with good BS bilat with no rales, rhonchi, or wheezes  Heart: RRR   Abdomen:  soft, nontender  Extremities:  Lower extremities - No pretibial edema. No lesions.  Neuro: MS is good with appropriate affect, pt is alert and Ox3    DATA REVIEWED:  Lab Results  Component Value Date   HGBA1C 9.8 (A) 09/16/2022   HGBA1C 10.1 (H) 05/27/2022   Lab Results  Component Value Date   CREATININE 0.97 08/25/2022     ASSESSMENT / PLAN / RECOMMENDATIONS:   1) Type 1 Diabetes Mellitus, Poorly controlled - Most recent A1c of 9.6 %. Goal A1c < 7.0 %.     -Patient continues with hyperglycemia despite starting him on insulin -In reviewing his CGM data, the patient has been noted with variable tight BG's overnight, patient states that he sometimes will take NovoLog at bedtime with a snack which may have resulted in the hypoglycemia.  It is unclear to me at this time if the hypoglycemia is coming from his basal insulin or his bedtime NovoLog intake.  I would not change his basal insulin at this time -Unfortunately he has been guesstimating NovoLog dose with meals and snacks, which results in glycemic excursions -He has not been using the correction scale, I will provide him with a new one and I have encouraged him to use it before each meal in addition to the baseline NovoLog dose   MEDICATIONS: Continue Lantus 24 units daily Increase NovoLog to 8 units 3 times daily before every meal Take NovoLog 4 units with a snack Start correction scale : NovoLog (BG -130/35) before each meal  EDUCATION / INSTRUCTIONS: BG monitoring instructions: Patient is instructed to check his blood sugars 3 times a day, before each meal. Call [place] [facility] if: BG  persistently < 70  I reviewed the Rule of 15 for the treatment of hypoglycemia in detail with the patient. Literature supplied.   2) Diabetic complications:  Eye: Does not have known diabetic retinopathy.  Neuro/ Feet: Does  have known peripheral neuropathy, prior to his diagnosis of diabetes Renal: Patient does not have known baseline CKD. He is not on an ACEI/ARB at present.    F/U in 4 weeks  Signed electronically by: Mack Guise, MD  [facility] Endocrinology  [address] [place], [place] [address] Phone: [phone] FAX: [phone]   CC: [name], [address] [place] [address] Phone: [phone]  Fax: [phone]    Return to Endocrinology clinic as below: Future Appointments  Date Time Provider [place]  09/20/2022  2:30 PM [facility] 1 [facility] [place]  09/22/2022 10:45 AM [facility] [place] [facility] None  09/22/2022 11:15 AM Curt Bears, MD [facility] None  09/22/2022 12:15 PM [facility] INFUSION [facility] None  10/01/2022 11:40 AM Eppie Gibson, MD [facility] None  10/20/2022  8:00 AM [facility] Micro FLUSH [facility] None  10/20/2022  8:30 AM Heilingoetter, Cassandra L, PA-C [facility] None  10/20/2022  9:45 AM [facility] INFUSION [facility] None

## 2022-09-19 ENCOUNTER — Other Ambulatory Visit: Payer: Self-pay | Admitting: Internal Medicine

## 2022-09-20 ENCOUNTER — Ambulatory Visit (HOSPITAL_COMMUNITY)
Admission: RE | Admit: 2022-09-20 | Discharge: 2022-09-20 | Disposition: A | Discharge status: Home / Self Care | Payer: Medicare HMO | Source: Ambulatory Visit | Attending: Internal Medicine | Admitting: Internal Medicine

## 2022-09-20 DIAGNOSIS — J9811 Atelectasis: Secondary | ICD-10-CM | POA: Diagnosis not present

## 2022-09-20 DIAGNOSIS — C3490 Malignant neoplasm of unspecified part of unspecified bronchus or lung: Secondary | ICD-10-CM | POA: Diagnosis not present

## 2022-09-20 DIAGNOSIS — J479 Bronchiectasis, uncomplicated: Secondary | ICD-10-CM | POA: Diagnosis not present

## 2022-09-20 DIAGNOSIS — K769 Liver disease, unspecified: Secondary | ICD-10-CM | POA: Diagnosis not present

## 2022-09-20 DIAGNOSIS — J439 Emphysema, unspecified: Secondary | ICD-10-CM | POA: Diagnosis not present

## 2022-09-20 MED ORDER — IOHEXOL 300 MG/ML  SOLN
100.0000 mL | Freq: Once | INTRAMUSCULAR | Status: AC | PRN
  Administered 2022-09-20: 100 mL via INTRAVENOUS

## 2022-09-20 MED ORDER — SODIUM CHLORIDE (PF) 0.9 % IJ SOLN
INTRAMUSCULAR | Status: AC
  Filled 2022-09-20: qty 50

## 2022-09-20 MED ORDER — OXYCODONE-ACETAMINOPHEN 5-325 MG PO TABS: 1.0000 | ORAL_TABLET | Freq: Three times a day (TID) | ORAL | 0 refills | Status: DC | PRN

## 2022-09-21 ENCOUNTER — Telehealth: Payer: Self-pay | Admitting: Medical Oncology

## 2022-09-21 NOTE — Telephone Encounter (Signed)
Called report See  IMPRESSION: "1. Interval improvement in the mediastinal and right hilar lymphadenopathy. 2. Multiple small right lower lobe lung nodules. These are relatively similar. Only 1 tiny nodule is clearly larger today. 3. Developing new liver lesions worrisome for liver metastases. 4. Developing left adrenal mass worrisome for metastasis. 5. Emphysema. 6. Aortic endograft in place. No obvious endoleak or graft failure but the study is not performed as a dedicated endograft protocol."  Pt appt tomorrow.

## 2022-09-22 ENCOUNTER — Inpatient Hospital Stay: Payer: Medicare HMO

## 2022-09-22 ENCOUNTER — Inpatient Hospital Stay: Payer: Medicare HMO | Attending: Internal Medicine | Admitting: Internal Medicine

## 2022-09-22 VITALS — BP 140/50 | HR 73 | Temp 97.8°F | Resp 16 | Wt 157.0 lb

## 2022-09-22 DIAGNOSIS — C3431 Malignant neoplasm of lower lobe, right bronchus or lung: Secondary | ICD-10-CM

## 2022-09-22 DIAGNOSIS — C7951 Secondary malignant neoplasm of bone: Secondary | ICD-10-CM | POA: Insufficient documentation

## 2022-09-22 DIAGNOSIS — Z95828 Presence of other vascular implants and grafts: Secondary | ICD-10-CM

## 2022-09-22 DIAGNOSIS — Z5112 Encounter for antineoplastic immunotherapy: Secondary | ICD-10-CM | POA: Diagnosis not present

## 2022-09-22 DIAGNOSIS — Z79620 Long term (current) use of immunosuppressive biologic: Secondary | ICD-10-CM | POA: Insufficient documentation

## 2022-09-22 LAB — CMP (CANCER CENTER ONLY)
ALT: 15 U/L (ref 0–44)
AST: 15 U/L (ref 15–41)
Albumin: 3.9 g/dL (ref 3.5–5.0)
Alkaline Phosphatase: 55 U/L (ref 38–126)
Anion gap: 3 — ABNORMAL LOW (ref 5–15)
BUN: 18 mg/dL (ref 8–23)
CO2: 27 mmol/L (ref 22–32)
Calcium: 9 mg/dL (ref 8.9–10.3)
Chloride: 104 mmol/L (ref 98–111)
Creatinine: 0.79 mg/dL (ref 0.61–1.24)
GFR, Estimated: >60 mL/min (ref >60)
Glucose, Bld: 246 mg/dL — ABNORMAL HIGH (ref 70–99)
Potassium: 4.3 mmol/L (ref 3.5–5.1)
Sodium: 134 mmol/L — ABNORMAL LOW (ref 135–145)
Total Bilirubin: 0.5 mg/dL (ref 0.3–1.2)
Total Protein: 6.4 g/dL — ABNORMAL LOW (ref 6.5–8.1)

## 2022-09-22 LAB — CBC WITH DIFFERENTIAL (CANCER CENTER ONLY)
Abs Immature Granulocytes: 0.01 10*3/uL (ref 0.00–0.07)
Basophils Absolute: 0.1 10*3/uL (ref 0.0–0.1)
Basophils Relative: 1 %
Eosinophils Absolute: 0.1 10*3/uL (ref 0.0–0.5)
Eosinophils Relative: 2 %
HCT: 39.3 % (ref 39.0–52.0)
Hemoglobin: 13.3 g/dL (ref 13.0–17.0)
Immature Granulocytes: 0 %
Lymphocytes Relative: 11 %
Lymphs Abs: 0.8 10*3/uL (ref 0.7–4.0)
MCH: 30.6 pg (ref 26.0–34.0)
MCHC: 33.8 g/dL (ref 30.0–36.0)
MCV: 90.6 fL (ref 80.0–100.0)
Monocytes Absolute: 0.5 10*3/uL (ref 0.1–1.0)
Monocytes Relative: 8 %
Neutro Abs: 5.6 10*3/uL (ref 1.7–7.7)
Neutrophils Relative %: 78 %
Platelet Count: 162 10*3/uL (ref 150–400)
RBC: 4.34 MIL/uL (ref 4.22–5.81)
RDW: 13.6 % (ref 11.5–15.5)
WBC Count: 7.1 10*3/uL (ref 4.0–10.5)
nRBC: 0 % (ref 0.0–0.2)

## 2022-09-22 LAB — TSH: TSH: 1.308 u[IU]/mL (ref 0.350–4.500)

## 2022-09-22 MED ORDER — HEPARIN SOD (PORK) LOCK FLUSH 100 UNIT/ML IV SOLN
500.0000 [IU] | Freq: Once | INTRAVENOUS | Status: AC
  Administered 2022-09-22: 500 [IU]

## 2022-09-22 MED ORDER — SODIUM CHLORIDE 0.9% FLUSH
10.0000 mL | Freq: Once | INTRAVENOUS | Status: AC
  Administered 2022-09-22: 10 mL

## 2022-09-22 MED ORDER — SODIUM CHLORIDE 0.9% FLUSH: 10.0000 mL | Freq: Once | INTRAVENOUS | Status: DC

## 2022-09-22 NOTE — Progress Notes (Signed)
[place] [phone]   [phone]  OFFICE PROGRESS NOTE  [name], DO [place] [address]  DIAGNOSIS:  1) Extensive stage (T1c, N2, M1C) small cell lung cancer presented with right lower lobe lung nodule in addition to right hilar and mediastinal lymphadenopathy and bone metastasis involving thoracic spines as well as the right iliac crest diagnosed in July 2023. 2) immunotherapy mediated type 1 diabetes mellitus diagnosed in October 2023.  PRIOR THERAPY:  1) Systemic chemotherapy with carboplatin for AUC of 5 on day 1, etoposide 100 Mg/M2 on days 1, 2 and 3 with Imfinzi 1500 Mg IV on day 1 and Cosela 240 Mg/M2 on the days of the chemotherapy as well as Imfinzi 1500 Mg IV on day 1 every 3 weeks the first 4 cycles followed by maintenance treatment every 4 weeks starting from cycle #5.  First dose was giving 03/10/2022.  The patient status post 8 cycles.  Last dose was given on August 25, 2022.  Starting from cycle #5 the patient will be on maintenance treatment with Imfinzi 1500 Mg IV every 4 weeks.  This treatment was discontinued secondary to disease progression. 2) Palliative radiotherapy to the enlarging mediastinal lymphadenopathy under the care of Dr. Isidore Moos.  CURRENT THERAPY: Systemic chemotherapy again with carboplatin for AUC of 5 on day 1 and etoposide 100 Mg/M2 on days 1, 2 and 3 with Neulasta support.  First dose September 28, 2022  INTERVAL HISTORY: Adam Jordan 77 y.o. male returns to the clinic today for follow-up visit.  The patient is feeling fine today with no concerning complaints except for fatigue.  The patient also has some aching pain in the mediastinal area after the radiation.  She denied having any current shortness of breath except with exertion with mild cough and no hemoptysis.  He denied having any nausea, vomiting, diarrhea or constipation.  He has no headache or visual changes.  He has no  weight loss or night sweats.  He has been tolerating his treatment with immunotherapy fairly well.  He is here today for evaluation with repeat CT scan of the chest, abdomen and pelvis for restaging of his disease.   MEDICAL HISTORY: Past Medical History:  Diagnosis Date   Anxiety    Arthritis    osteoarthrititis- knees and most joints.   COPD (chronic obstructive pulmonary disease) (HCC)    moderate -no regular use of inhalers- rare use of oxygen as sexual activity   Dyspnea    outside in hot weather and also with pollen   Elevated blood-pressure reading, without diagnosis of hypertension 07/31/2019   Encounter for antineoplastic chemotherapy 03/04/2022   Encounter for antineoplastic immunotherapy 03/04/2022   History of heart artery stent 11/04/2021   Hypercholesterolemia 11/05/2013   Hyperlipidemia    Hypertension    Malaise and fatigue 04/19/2013   Neuromuscular disorder ([place])    feet   Non-recurrent unilateral inguinal hernia without obstruction or gangrene 07/21/2021   Paroxysmal atrial flutter ([place]) 01/15/2021   Port-A-Cath in place 03/31/2022   Routine history and physical examination of adult 11/05/2013   Shoulder joint pain 08/23/2013   Status post lumbar microdiscectomy 07/31/2019   Thoracoabdominal aneurysm [facility])    s/p FEVAR 4 Vessel TABME 02/19/20 Dr. Katy Apo    ALLERGIES:  is allergic to losartan, atorvastatin calcium, and pollen extract-tree extract [pollen extract].  MEDICATIONS:  Current Outpatient Medications  Medication Sig Dispense Refill   Alum Hydroxide-Mag Trisilicate (  GAVISCON) 80-14.2 MG CHEW Chew 1 tablet by mouth daily as needed (heartburn).     apixaban (ELIQUIS) 5 MG TABS tablet Take 1 tablet (5 mg total) by mouth 2 (two) times daily. 180 tablet 0   atorvastatin (LIPITOR) 20 MG tablet Take 20 mg by mouth at bedtime.     blood glucose meter kit and supplies Dispense based on patient and insurance preference. Use up to four times daily as directed. (FOR  ICD-10 E10.9, E11.9). (Patient taking differently: 1 each by Other route See admin instructions. Dispense based on patient and insurance preference. Use up to four times daily as directed. (FOR ICD-10 E10.9, E11.9).) 1 each 0   clonazePAM (KLONOPIN) 1 MG tablet Take 1 tablet (1 mg total) by mouth 2 (two) times daily as needed for anxiety. 60 tablet 5   Continuous Blood Gluc Sensor (DEXCOM G7 SENSOR) MISC 1 Device by Does not apply route as directed. 9 each 3   fluticasone (FLONASE) 50 MCG/ACT nasal spray Place 1 spray into both nostrils 2 (two) times daily as needed for allergies.     insulin aspart (NOVOLOG FLEXPEN) 100 UNIT/ML FlexPen Max daily 30 units (Patient taking differently: Inject 1 Units into the skin 3 (three) times daily with meals. Max daily 30 units) 15 mL 11   insulin glargine (LANTUS) 100 UNIT/ML Solostar Pen Inject 24 Units into the skin daily. 30 mL 3   Insulin Pen Needle 32G X 4 MM MISC 1 Device by Does not apply route in the morning, at noon, in the evening, and at bedtime. 400 each 3   Lancets (ONETOUCH DELICA PLUS [id number]) MISC Apply 1 each topically 4 (four) times daily.     lidocaine (XYLOCAINE) 2 % solution Patient: Mix 1part 2% viscous lidocaine, 1part H20. Swallow 70mL of diluted mixture, 61min before meals and at bedtime, up to QID (Patient taking differently: Use as directed 15 mLs in the mouth or throat See admin instructions. Patient: Mix 1part 2% viscous lidocaine, 1part H20. Swallow 100mL of diluted mixture, 37min before meals and at bedtime, up to QID) 100 mL 3   lidocaine-prilocaine (EMLA) cream Apply to the Port-A-Cath site 30-60 minutes before chemotherapy (Patient taking differently: Apply 1 Application topically See admin instructions. Apply to the Port-A-Cath site 30-60 minutes before chemotherapy) 30 g 0   meloxicam (MOBIC) 15 MG tablet Take 15 mg by mouth daily as needed for pain.     Multiple Vitamin (MULTIVITAMIN WITH MINERALS) TABS tablet Take 1 tablet by  mouth daily. 30 tablet 5   Naphazoline HCl (CLEAR EYES OP) Place 1 drop into both eyes daily.     nitroGLYCERIN (NITROSTAT) 0.4 MG SL tablet Place 1 tablet (0.4 mg total) under the tongue every 5 (five) minutes as needed for chest pain. 25 tablet 6   ONETOUCH ULTRA test strip 1 each by Other route 3 (three) times daily. for testing 300 each 3   oxyCODONE-acetaminophen (PERCOCET/ROXICET) 5-325 MG tablet Take 1 tablet by mouth every 8 (eight) hours as needed for severe pain. 30 tablet 0   PROAIR HFA 108 (90 BASE) MCG/ACT inhaler Inhale 2 puffs into the lungs every 6 (six) hours as needed for wheezing or shortness of breath.   2   sildenafil (REVATIO) 20 MG tablet Take 60 mg by mouth daily as needed (ED).     Tiotropium Bromide-Olodaterol (STIOLTO RESPIMAT) 2.5-2.5 MCG/ACT AERS Inhale 2 puffs into the lungs daily. 4 g 0   traZODone (DESYREL) 50 MG tablet Take 50 mg  by mouth at bedtime.     triamcinolone cream (KENALOG) 0.1 % Apply 1 Application topically daily.     No current facility-administered medications for this visit.    SURGICAL HISTORY:  Past Surgical History:  Procedure Laterality Date   ABDOMINAL AORTIC ANEURYSM REPAIR  02/06/2020   BRONCHIAL BIOPSY  02/23/2022   Procedure: BRONCHIAL BIOPSIES;  Surgeon: Collene Gobble, MD;  Location: [facility] ENDOSCOPY;  Service: Pulmonary;;   BRONCHIAL BRUSHINGS  02/23/2022   Procedure: BRONCHIAL BRUSHINGS;  Surgeon: Collene Gobble, MD;  Location: [facility] ENDOSCOPY;  Service: Pulmonary;;   BRONCHIAL NEEDLE ASPIRATION BIOPSY  02/23/2022   Procedure: BRONCHIAL NEEDLE ASPIRATION BIOPSIES;  Surgeon: Collene Gobble, MD;  Location: [place] ENDOSCOPY;  Service: Pulmonary;;   BROW LIFT Bilateral 12/11/2021   Procedure: UPPER LID BLEPHAROPLASTY;  Surgeon: Irene Limbo, MD;  Location: [place];  Service: Plastics;  Laterality: Bilateral;   CYST REMOVAL LEG Left 07/03/2021   Procedure: EXCISION CYST LEFT BUTTOCK;  Surgeon: Jovita Kussmaul, MD;  Location:  [place];  Service: General;  Laterality: Left;   EYE SURGERY Bilateral    cataract surgery   FINGER ARTHROPLASTY Left    left thumb-Dr. Amedeo Plenty   INGUINAL HERNIA REPAIR Left 07/03/2021   Procedure: LEFT INGUINAL HERNIA REPAIR WITH MESH;  Surgeon: Jovita Kussmaul, MD;  Location: [place];  Service: General;  Laterality: Left;   INSERTION OF MESH N/A 07/03/2021   Procedure: INSERTION OF MESH X2;  Surgeon: Jovita Kussmaul, MD;  Location: [place];  Service: General;  Laterality: N/A;   IR IMAGING GUIDED PORT INSERTION  03/12/2022   IR RADIOLOGIST EVAL & MGMT  10/25/2019   KNEE ARTHROSCOPY Left    KNEE SURGERY Left    Bakers cyst x2   SHOULDER ARTHROSCOPY Right    thumb surgery     TONSILLECTOMY     TOTAL KNEE ARTHROPLASTY Left 03/19/2016   Procedure: LEFT TOTAL KNEE ARTHROPLASTY;  Surgeon: Sydnee Cabal, MD;  Location: [facility];  Service: Orthopedics;  Laterality: Left;   TOTAL SHOULDER REPLACEMENT Right    UMBILICAL HERNIA REPAIR N/A 07/03/2021   Procedure: UMBILICAL HERNIA REPAIR WITH MESH;  Surgeon: Jovita Kussmaul, MD;  Location: [place];  Service: General;  Laterality: N/A;   VASCULAR SURGERY     VASECTOMY     VIDEO BRONCHOSCOPY WITH ENDOBRONCHIAL ULTRASOUND Right 02/23/2022   Procedure: VIDEO BRONCHOSCOPY WITH ENDOBRONCHIAL ULTRASOUND;  Surgeon: Collene Gobble, MD;  Location: [facility] ENDOSCOPY;  Service: Pulmonary;  Laterality: Right;   VIDEO BRONCHOSCOPY WITH RADIAL ENDOBRONCHIAL ULTRASOUND  02/23/2022   Procedure: VIDEO BRONCHOSCOPY WITH RADIAL ENDOBRONCHIAL ULTRASOUND;  Surgeon: Collene Gobble, MD;  Location: [facility] ENDOSCOPY;  Service: Pulmonary;;    REVIEW OF SYSTEMS:  Constitutional: positive for fatigue Eyes: negative Ears, nose, mouth, throat, and face: negative Respiratory: positive for dyspnea on exertion and pleurisy/chest pain Cardiovascular: negative Gastrointestinal: negative Genitourinary:negative Integument/breast: negative Hematologic/lymphatic:  negative Musculoskeletal:negative Neurological: negative Behavioral/Psych: negative Endocrine: negative Allergic/Immunologic: negative   PHYSICAL EXAMINATION: General appearance: alert, cooperative, fatigued, and no distress Head: Normocephalic, without obvious abnormality, atraumatic Neck: no adenopathy, no JVD, supple, symmetrical, trachea midline, and thyroid not enlarged, symmetric, no tenderness/mass/nodules Lymph nodes: Cervical, supraclavicular, and axillary nodes normal. Resp: clear to auscultation bilaterally Back: symmetric, no curvature. ROM normal. No CVA tenderness. Cardio: regular rate and rhythm, S1, S2 normal, no murmur, click, rub or gallop GI: soft, non-tender; bowel sounds normal; no masses,  no organomegaly Extremities: extremities normal, atraumatic, no cyanosis or  edema Neurologic: Alert and oriented X 3, normal strength and tone. Normal symmetric reflexes. Normal coordination and gait  ECOG PERFORMANCE STATUS: 1 - Symptomatic but completely ambulatory  Blood pressure (!) 140/50, pulse 73, temperature 97.8 F (36.6 C), temperature source Oral, resp. rate 16, weight 157 lb (71.2 kg), SpO2 98 %.  LABORATORY DATA: Lab Results  Component Value Date   WBC 7.1 09/22/2022   HGB 13.3 09/22/2022   HCT 39.3 09/22/2022   MCV 90.6 09/22/2022   PLT 162 09/22/2022      Chemistry      Component Value Date/Time   NA 135 08/25/2022 0818   NA 138 02/03/2021 0944   K 4.1 08/25/2022 0818   CL 102 08/25/2022 0818   CO2 29 08/25/2022 0818   BUN 20 08/25/2022 0818   BUN 13 02/03/2021 0944   CREATININE 0.97 08/25/2022 0818      Component Value Date/Time   CALCIUM 9.9 08/25/2022 0818   ALKPHOS 57 08/25/2022 0818   AST 17 08/25/2022 0818   ALT 15 08/25/2022 0818   BILITOT 0.5 08/25/2022 0818       RADIOGRAPHIC STUDIES: CT Chest W Contrast  Result Date: 09/21/2022 CLINICAL DATA:  Staging small-cell lung cancer. Completed chemotherapy in October 2023. Finished  radiation 2 weeks ago * Tracking Code: BO * EXAM: CT CHEST, ABDOMEN, AND PELVIS WITH CONTRAST TECHNIQUE: Multidetector CT imaging of the chest, abdomen and pelvis was performed following the standard protocol during bolus administration of intravenous contrast. RADIATION DOSE REDUCTION: This exam was performed according to the departmental dose-optimization program which includes automated exposure control, adjustment of the mA and/or kV according to patient size and/or use of iterative reconstruction technique. CONTRAST:  146mL OMNIPAQUE IOHEXOL 300 MG/ML  SOLN COMPARISON:  07/22/2022 FINDINGS: CT CHEST FINDINGS Cardiovascular: Right upper chest port. Heart is nonenlarged. No pericardial effusion. Coronary artery calcifications are seen. Normal caliber thoracic aorta with scattered vascular calcifications. Slight ectasia of the aortic arch with diameter approaching up to 3.2 cm on series 2, image 25. There is a vascular stent in place along the lower descending thoracic aorta extending into the abdomen. Mediastinum/Nodes: No specific abnormal lymph node enlargement identified in the axillary regions. None along the left hilum. Previously there were right hilar nodes identified which have significantly improved. Mediastinal nodes have also improved. Specific lesions will be followed for continuity. The previous pretracheal node is measured 2.3 x 2.1 cm and today on image 31 of series 2 measures 12 by 8 mm. Subcarinal lesion which measured 2.9 x 2.4 cm, today on series 2, image 36 measures 2.4 by 0.8 cm. No new lymph node enlargement identified. Lungs/Pleura: Centrilobular and paraseptal emphysematous changes are identified. No consolidation, pneumothorax or effusion. Mild dependent atelectasis noted bilaterally. The ill-defined nodule in the right lower lobe which previously measured 18 x 8 mm, today when measured in the same fashion on series 4, image 114 measures 16 by 8 mm. The smaller area just superior to this  which previously measured 8 x 7 mm, today on image 104 of series 4 measures 7 by 7 mm. There is a small nodule just medial to this which is slightly larger on series 4, image 105 measuring 5 x 3 mm. Previously 4 x 2 mm. Few other smaller areas of nodularity in the right lower lobe are again seen such as image 126. There is lingular and middle lobe scarring atelectatic changes. Areas of bronchiectasis. Musculoskeletal: Slight curvature of the spine. Scattered degenerative changes. Streak artifact  related to the patient's right shoulder arthroplasty. CT ABDOMEN PELVIS FINDINGS Hepatobiliary: Fatty liver infiltration identified. There is a new liver lesion identified inferiorly along the margin of segment 4B and 5 on series 2, image 77 measuring 18 by 11 mm. There is also a new tiny focus seen in segment 4A on image 63 of series 2 measuring 7 mm. Few other small foci elsewhere on image 65, 68 in the right hepatic lobe. These are worrisome for developing liver metastases. Patent portal vein. Gallbladder is nondilated. Pancreas: Unremarkable. No pancreatic ductal dilatation or surrounding inflammatory changes. Spleen: Normal in size without focal abnormality. Adrenals/Urinary Tract: Right adrenal gland is preserved. The left is nodular and thickened, increased from previous. Example on image 68 of series 2 measures 16 by 13 mm. Previously this area would have measured 14 by 11 mm. No enhancing renal mass or collecting system dilatation. Ureters have normal course and caliber extending down to the bladder. Preserved contours of the urinary bladder. Stomach/Bowel: Moderate scattered colonic stool. Large bowel is nondilated. There is atypical distribution of bowel with colon and some small bowel extending between the liver in the anterior abdominal wall margin. Sigmoid colon diverticulosis. Stomach is nondilated. Normal appendix. Of note the cecum is in the upper anterior right hemiabdomen between the liver in the anterior  abdominal wall itself. Atypical distribution of bowel. Vascular/Lymphatic: Once again there is a aortic endograft in place with extension up into the distal descending thoracic aorta. Multiple stents as well extending along the SMA, celiac and bilateral renal arteries. No obvious endoleak or graft failure but the study is not performed as a dedicated endograft protocol. No specific abnormal lymph node enlargement seen in the abdomen and pelvis. Reproductive: Prostate is unremarkable. Other: Mild anasarca.  No definite ascites. Musculoskeletal: Curvature and degenerative changes along the spine. IMPRESSION: 1. Interval improvement in the mediastinal and right hilar lymphadenopathy. 2. Multiple small right lower lobe lung nodules. These are relatively similar. Only 1 tiny nodule is clearly larger today. 3. Developing new liver lesions worrisome for liver metastases. 4. Developing left adrenal mass worrisome for metastasis. 5. Emphysema. 6. Aortic endograft in place. No obvious endoleak or graft failure but the study is not performed as a dedicated endograft protocol. Aortic Atherosclerosis (ICD10-I70.0) and Emphysema (ICD10-J43.9). Identified consistent with Results will be called to the ordering service by the Radiology physician assistant team Electronically Signed   By: Jill Side M.D.   On: 09/21/2022 11:55   CT Abdomen Pelvis W Contrast  Result Date: 09/21/2022 CLINICAL DATA:  Staging small-cell lung cancer. Completed chemotherapy in October 2023. Finished radiation 2 weeks ago * Tracking Code: BO * EXAM: CT CHEST, ABDOMEN, AND PELVIS WITH CONTRAST TECHNIQUE: Multidetector CT imaging of the chest, abdomen and pelvis was performed following the standard protocol during bolus administration of intravenous contrast. RADIATION DOSE REDUCTION: This exam was performed according to the departmental dose-optimization program which includes automated exposure control, adjustment of the mA and/or kV according to  patient size and/or use of iterative reconstruction technique. CONTRAST:  131mL OMNIPAQUE IOHEXOL 300 MG/ML  SOLN COMPARISON:  07/22/2022 FINDINGS: CT CHEST FINDINGS Cardiovascular: Right upper chest port. Heart is nonenlarged. No pericardial effusion. Coronary artery calcifications are seen. Normal caliber thoracic aorta with scattered vascular calcifications. Slight ectasia of the aortic arch with diameter approaching up to 3.2 cm on series 2, image 25. There is a vascular stent in place along the lower descending thoracic aorta extending into the abdomen. Mediastinum/Nodes: No specific abnormal lymph  node enlargement identified in the axillary regions. None along the left hilum. Previously there were right hilar nodes identified which have significantly improved. Mediastinal nodes have also improved. Specific lesions will be followed for continuity. The previous pretracheal node is measured 2.3 x 2.1 cm and today on image 31 of series 2 measures 12 by 8 mm. Subcarinal lesion which measured 2.9 x 2.4 cm, today on series 2, image 36 measures 2.4 by 0.8 cm. No new lymph node enlargement identified. Lungs/Pleura: Centrilobular and paraseptal emphysematous changes are identified. No consolidation, pneumothorax or effusion. Mild dependent atelectasis noted bilaterally. The ill-defined nodule in the right lower lobe which previously measured 18 x 8 mm, today when measured in the same fashion on series 4, image 114 measures 16 by 8 mm. The smaller area just superior to this which previously measured 8 x 7 mm, today on image 104 of series 4 measures 7 by 7 mm. There is a small nodule just medial to this which is slightly larger on series 4, image 105 measuring 5 x 3 mm. Previously 4 x 2 mm. Few other smaller areas of nodularity in the right lower lobe are again seen such as image 126. There is lingular and middle lobe scarring atelectatic changes. Areas of bronchiectasis. Musculoskeletal: Slight curvature of the spine.  Scattered degenerative changes. Streak artifact related to the patient's right shoulder arthroplasty. CT ABDOMEN PELVIS FINDINGS Hepatobiliary: Fatty liver infiltration identified. There is a new liver lesion identified inferiorly along the margin of segment 4B and 5 on series 2, image 77 measuring 18 by 11 mm. There is also a new tiny focus seen in segment 4A on image 63 of series 2 measuring 7 mm. Few other small foci elsewhere on image 65, 68 in the right hepatic lobe. These are worrisome for developing liver metastases. Patent portal vein. Gallbladder is nondilated. Pancreas: Unremarkable. No pancreatic ductal dilatation or surrounding inflammatory changes. Spleen: Normal in size without focal abnormality. Adrenals/Urinary Tract: Right adrenal gland is preserved. The left is nodular and thickened, increased from previous. Example on image 68 of series 2 measures 16 by 13 mm. Previously this area would have measured 14 by 11 mm. No enhancing renal mass or collecting system dilatation. Ureters have normal course and caliber extending down to the bladder. Preserved contours of the urinary bladder. Stomach/Bowel: Moderate scattered colonic stool. Large bowel is nondilated. There is atypical distribution of bowel with colon and some small bowel extending between the liver in the anterior abdominal wall margin. Sigmoid colon diverticulosis. Stomach is nondilated. Normal appendix. Of note the cecum is in the upper anterior right hemiabdomen between the liver in the anterior abdominal wall itself. Atypical distribution of bowel. Vascular/Lymphatic: Once again there is a aortic endograft in place with extension up into the distal descending thoracic aorta. Multiple stents as well extending along the SMA, celiac and bilateral renal arteries. No obvious endoleak or graft failure but the study is not performed as a dedicated endograft protocol. No specific abnormal lymph node enlargement seen in the abdomen and pelvis.  Reproductive: Prostate is unremarkable. Other: Mild anasarca.  No definite ascites. Musculoskeletal: Curvature and degenerative changes along the spine. IMPRESSION: 1. Interval improvement in the mediastinal and right hilar lymphadenopathy. 2. Multiple small right lower lobe lung nodules. These are relatively similar. Only 1 tiny nodule is clearly larger today. 3. Developing new liver lesions worrisome for liver metastases. 4. Developing left adrenal mass worrisome for metastasis. 5. Emphysema. 6. Aortic endograft in place. No obvious endoleak  or graft failure but the study is not performed as a dedicated endograft protocol. Aortic Atherosclerosis (ICD10-I70.0) and Emphysema (ICD10-J43.9). Identified consistent with Results will be called to the ordering service by the Radiology physician assistant team Electronically Signed   By: Jill Side M.D.   On: 09/21/2022 11:55    ASSESSMENT AND PLAN: This is a very pleasant 77 years old white male with extensive stage (T1c, N2, M1C) small cell lung cancer presented with right lower lobe lung nodule in addition to right hilar and mediastinal lymphadenopathy and bone metastasis involving thoracic spines as well as the right iliac crest diagnosed in July 2023. The patient started systemic chemotherapy with carboplatin for AUC of 5 on day 1, etoposide 100 Mg/M2 on days 1, 2 and 3 as well as Cosela 240 Mg/M2 on the days of the chemotherapy and Imfinzi 1500 Mg IV every 3 weeks with the induction treatment.  He is status post 8 cycles.  Starting from cycle #5 the patient will be on maintenance treatment with Imfinzi 1500 Mg IV every 4 weeks.  Last dose was given on August 25, 2022 discontinued secondary to disease progression. He is currently undergoing palliative radiotherapy to the enlarging mediastinal lymphadenopathy under the care of Dr. Isidore Moos. The patient tolerated his previous course of systemic chemotherapy as well as the palliative radiation fairly well except  for the aching pain in the sternal area after the radiation. He had repeat CT scan of the chest, abdomen and pelvis performed recently.  I personally and independently reviewed the scan images and discussed the results with the patient today. His scan showed interval improvement in the mediastinal and right hilar adenopathy but unfortunately he has developed a new liver lesion worrisome for liver metastasis as well as developing left adrenal mass again worrisome for metastasis. I recommended for the patient to discontinue his current treatment with the maintenance immunotherapy at this point. I discussed with the patient other treatment options including resuming systemic chemotherapy again with carboplatin for AUC of 5 on day 1 and etoposide 100 Mg/M2 on days 1, 2 and 3 with Neulasta support every 3 weeks for 4 cycles. Other option would be treatment with Zepzelca (lurbinectedin) as a second line option but the patient had good response to the chemotherapy as a first-line and I will give this treatment another chance before switching to a second line zepzelca. The patient is very familiar with the adverse effect of the chemotherapy and he is willing to proceed with it. He is expected to start the first cycle of this treatment next week. For the immunotherapy mediated type 1 diabetes mellitus, he will continue his evaluation and treatment under Dr. Kelton Pillar. He was advised to call immediately if he has any other concerning symptoms in the interval. The patient voices understanding of current disease status and treatment options and is in agreement with the current care plan.  All questions were answered. The patient knows to call the clinic with any problems, questions or concerns. We can certainly see the patient much sooner if necessary.  The total time spent in the appointment was 35 minutes.  Disclaimer: This note was dictated with voice recognition software. Similar sounding words can  inadvertently be transcribed and may not be corrected upon review.

## 2022-09-22 NOTE — Progress Notes (Signed)
DISCONTINUE ON PATHWAY REGIMEN - Small Cell Lung     Cycles 1 through 4: A cycle is every 21 days:     Durvalumab      Carboplatin      Etoposide    Cycles 5 and beyond: A cycle is every 28 days:     Durvalumab   **Always confirm dose/schedule in your pharmacy ordering system**  REASON: Disease Progression PRIOR TREATMENT: [id number]: Durvalumab 1,500 mg D1 + Carboplatin AUC=5 D1 + Etoposide 100 mg/m2 D1-3 q21 Days x 4 Cycles, Followed by Durvalumab 1,500 mg q28 Days Until Progression or Unacceptable Toxicity TREATMENT RESPONSE: Partial Response (PR)  START OFF PATHWAY REGIMEN - Small Cell Lung   [id number]:Carboplatin AUC=5 IV D1 + Etoposide 100 mg/m2 IV D1-3 q21 Days:   A cycle is every 21 days:     Carboplatin      Etoposide   **Always confirm dose/schedule in your pharmacy ordering system**  Patient Characteristics: Relapsed or Progressive Disease, Second Line, Relapse ? 6 Months Therapeutic Status: Relapsed or Progressive Disease Line of Therapy: Second Line Time to Relapse: Relapse ? 6 Months Intent of Therapy: Non-Curative / Palliative Intent, Discussed with Patient

## 2022-09-23 ENCOUNTER — Other Ambulatory Visit: Payer: Self-pay

## 2022-09-23 ENCOUNTER — Encounter: Payer: Self-pay | Admitting: Internal Medicine

## 2022-09-23 ENCOUNTER — Telehealth: Payer: Self-pay | Admitting: Internal Medicine

## 2022-09-23 NOTE — Telephone Encounter (Signed)
Scheduled per 02/21 los/work-queue, patient has been called and notified.

## 2022-09-27 MED FILL — Dexamethasone Sodium Phosphate Inj 100 MG/10ML: INTRAMUSCULAR | Qty: 1 | Status: AC

## 2022-09-27 MED FILL — Fosaprepitant Dimeglumine For IV Infusion 150 MG (Base Eq): INTRAVENOUS | Qty: 5 | Status: AC

## 2022-09-28 ENCOUNTER — Inpatient Hospital Stay: Payer: Medicare HMO

## 2022-09-28 ENCOUNTER — Other Ambulatory Visit: Payer: Medicare HMO

## 2022-09-28 VITALS — BP 144/70 | HR 89 | Temp 97.9°F | Resp 18

## 2022-09-28 DIAGNOSIS — C3431 Malignant neoplasm of lower lobe, right bronchus or lung: Secondary | ICD-10-CM | POA: Diagnosis not present

## 2022-09-28 DIAGNOSIS — C7951 Secondary malignant neoplasm of bone: Secondary | ICD-10-CM | POA: Diagnosis not present

## 2022-09-28 DIAGNOSIS — Z5112 Encounter for antineoplastic immunotherapy: Secondary | ICD-10-CM | POA: Diagnosis not present

## 2022-09-28 DIAGNOSIS — Z95828 Presence of other vascular implants and grafts: Secondary | ICD-10-CM

## 2022-09-28 DIAGNOSIS — Z79620 Long term (current) use of immunosuppressive biologic: Secondary | ICD-10-CM | POA: Diagnosis not present

## 2022-09-28 LAB — CMP (CANCER CENTER ONLY)
ALT: 18 U/L (ref 0–44)
AST: 17 U/L (ref 15–41)
Albumin: 4 g/dL (ref 3.5–5.0)
Alkaline Phosphatase: 57 U/L (ref 38–126)
Anion gap: 9 (ref 5–15)
BUN: 19 mg/dL (ref 8–23)
CO2: 24 mmol/L (ref 22–32)
Calcium: 9 mg/dL (ref 8.9–10.3)
Chloride: 100 mmol/L (ref 98–111)
Creatinine: 0.83 mg/dL (ref 0.61–1.24)
GFR, Estimated: >60 mL/min (ref >60)
Glucose, Bld: 316 mg/dL — ABNORMAL HIGH (ref 70–99)
Potassium: 4.9 mmol/L (ref 3.5–5.1)
Sodium: 133 mmol/L — ABNORMAL LOW (ref 135–145)
Total Bilirubin: 0.7 mg/dL (ref 0.3–1.2)
Total Protein: 6.6 g/dL (ref 6.5–8.1)

## 2022-09-28 LAB — CBC WITH DIFFERENTIAL (CANCER CENTER ONLY)
Abs Immature Granulocytes: 0.02 10*3/uL (ref 0.00–0.07)
Basophils Absolute: 0 10*3/uL (ref 0.0–0.1)
Basophils Relative: 1 %
Eosinophils Absolute: 0.1 10*3/uL (ref 0.0–0.5)
Eosinophils Relative: 2 %
HCT: 39.6 % (ref 39.0–52.0)
Hemoglobin: 13.5 g/dL (ref 13.0–17.0)
Immature Granulocytes: 0 %
Lymphocytes Relative: 9 %
Lymphs Abs: 0.7 10*3/uL (ref 0.7–4.0)
MCH: 30.9 pg (ref 26.0–34.0)
MCHC: 34.1 g/dL (ref 30.0–36.0)
MCV: 90.6 fL (ref 80.0–100.0)
Monocytes Absolute: 0.5 10*3/uL (ref 0.1–1.0)
Monocytes Relative: 6 %
Neutro Abs: 6.5 10*3/uL (ref 1.7–7.7)
Neutrophils Relative %: 82 %
Platelet Count: 144 10*3/uL — ABNORMAL LOW (ref 150–400)
RBC: 4.37 MIL/uL (ref 4.22–5.81)
RDW: 13.7 % (ref 11.5–15.5)
WBC Count: 7.9 10*3/uL (ref 4.0–10.5)
nRBC: 0 % (ref 0.0–0.2)

## 2022-09-28 MED ORDER — SODIUM CHLORIDE 0.9 % IV SOLN: Freq: Once | INTRAVENOUS | Status: AC

## 2022-09-28 MED ORDER — SODIUM CHLORIDE 0.9% FLUSH
10.0000 mL | INTRAVENOUS | Status: DC | PRN
  Administered 2022-09-28: 10 mL

## 2022-09-28 MED ORDER — HEPARIN SOD (PORK) LOCK FLUSH 100 UNIT/ML IV SOLN
500.0000 [IU] | Freq: Once | INTRAVENOUS | Status: AC | PRN
  Administered 2022-09-28: 500 [IU]

## 2022-09-28 MED ORDER — SODIUM CHLORIDE 0.9% FLUSH
10.0000 mL | Freq: Once | INTRAVENOUS | Status: AC
  Administered 2022-09-28: 10 mL

## 2022-09-28 MED ORDER — SODIUM CHLORIDE 0.9 % IV SOLN
150.0000 mg | Freq: Once | INTRAVENOUS | Status: AC
  Administered 2022-09-28: 150 mg via INTRAVENOUS
  Filled 2022-09-28: qty 150

## 2022-09-28 MED ORDER — SODIUM CHLORIDE 0.9 % IV SOLN
100.0000 mg/m2 | Freq: Once | INTRAVENOUS | Status: AC
  Administered 2022-09-28: 200 mg via INTRAVENOUS
  Filled 2022-09-28: qty 10

## 2022-09-28 MED ORDER — PALONOSETRON HCL INJECTION 0.25 MG/5ML
0.2500 mg | Freq: Once | INTRAVENOUS | Status: AC
  Administered 2022-09-28: 0.25 mg via INTRAVENOUS
  Filled 2022-09-28: qty 5

## 2022-09-28 MED ORDER — SODIUM CHLORIDE 0.9 % IV SOLN
10.0000 mg | Freq: Once | INTRAVENOUS | Status: AC
  Administered 2022-09-28: 10 mg via INTRAVENOUS
  Filled 2022-09-28: qty 10

## 2022-09-28 MED ORDER — SODIUM CHLORIDE 0.9 % IV SOLN
446.5000 mg | Freq: Once | INTRAVENOUS | Status: AC
  Administered 2022-09-28: 450 mg via INTRAVENOUS
  Filled 2022-09-28: qty 45

## 2022-09-28 MED FILL — Dexamethasone Sodium Phosphate Inj 100 MG/10ML: INTRAMUSCULAR | Qty: 1 | Status: AC

## 2022-09-28 NOTE — Patient Instructions (Signed)
[facility]  Discharge Instructions: Thank you for choosing [facility] to provide your oncology and hematology care.   If you have a lab appointment with the [facility], please go directly to the [facility] and check in at the registration area.   Wear comfortable clothing and clothing appropriate for easy access to any Portacath or PICC line.   We strive to give you quality time with your provider. You may need to reschedule your appointment if you arrive late (15 or more minutes).  Arriving late affects you and other patients whose appointments are after yours.  Also, if you miss three or more appointments without notifying the office, you may be dismissed from the clinic at the provider's discretion.      For prescription refill requests, have your pharmacy contact our office and allow 72 hours for refills to be completed.    Today you received the following chemotherapy and/or immunotherapy agents: Etoposide, Carboplatin.      To help prevent nausea and vomiting after your treatment, we encourage you to take your nausea medication as directed.  BELOW ARE SYMPTOMS THAT SHOULD BE REPORTED IMMEDIATELY: *FEVER GREATER THAN 100.4 F (38 C) OR HIGHER *CHILLS OR SWEATING *NAUSEA AND VOMITING THAT IS NOT CONTROLLED WITH YOUR NAUSEA MEDICATION *UNUSUAL SHORTNESS OF BREATH *UNUSUAL BRUISING OR BLEEDING *URINARY PROBLEMS (pain or burning when urinating, or frequent urination) *BOWEL PROBLEMS (unusual diarrhea, constipation, pain near the anus) TENDERNESS IN MOUTH AND THROAT WITH OR WITHOUT PRESENCE OF ULCERS (sore throat, sores in mouth, or a toothache) UNUSUAL RASH, SWELLING OR PAIN  UNUSUAL VAGINAL DISCHARGE OR ITCHING   Items with * indicate a potential emergency and should be followed up as soon as possible or go to the Emergency Department if any problems should occur.  Please show the CHEMOTHERAPY ALERT CARD or IMMUNOTHERAPY ALERT  CARD at check-in to the Emergency Department and triage nurse.  Should you have questions after your visit or need to cancel or reschedule your appointment, please contact [facility]  Dept: [phone]  and follow the prompts.  Office hours are 8:00 a.m. to 4:30 p.m. Monday - Friday. Please note that voicemails left after 4:00 p.m. may not be returned until the following business day.  We are closed weekends and major holidays. You have access to a nurse at all times for urgent questions. Please call the main number to the clinic Dept: [phone] and follow the prompts.   For any non-urgent questions, you may also contact your provider using MyChart. We now offer e-Visits for anyone 71 and older to request care online for non-urgent symptoms. For details visit [email or web address].   Also download the MyChart app! Go to the app store, search "MyChart", open the app, select [facility], and log in with your MyChart username and password.

## 2022-09-29 ENCOUNTER — Inpatient Hospital Stay: Payer: Medicare HMO

## 2022-09-29 ENCOUNTER — Other Ambulatory Visit: Payer: Self-pay | Admitting: Internal Medicine

## 2022-09-29 VITALS — BP 147/63 | HR 85 | Temp 98.2°F | Resp 18

## 2022-09-29 DIAGNOSIS — C3431 Malignant neoplasm of lower lobe, right bronchus or lung: Secondary | ICD-10-CM | POA: Diagnosis not present

## 2022-09-29 DIAGNOSIS — C7951 Secondary malignant neoplasm of bone: Secondary | ICD-10-CM | POA: Diagnosis not present

## 2022-09-29 DIAGNOSIS — Z5112 Encounter for antineoplastic immunotherapy: Secondary | ICD-10-CM | POA: Diagnosis not present

## 2022-09-29 DIAGNOSIS — Z79620 Long term (current) use of immunosuppressive biologic: Secondary | ICD-10-CM | POA: Diagnosis not present

## 2022-09-29 MED ORDER — SODIUM CHLORIDE 0.9 % IV SOLN
100.0000 mg/m2 | Freq: Once | INTRAVENOUS | Status: AC
  Administered 2022-09-29: 200 mg via INTRAVENOUS
  Filled 2022-09-29: qty 10

## 2022-09-29 MED ORDER — OXYCODONE-ACETAMINOPHEN 5-325 MG PO TABS: 1.0000 | ORAL_TABLET | Freq: Three times a day (TID) | ORAL | 0 refills | Status: DC | PRN

## 2022-09-29 MED ORDER — SODIUM CHLORIDE 0.9 % IV SOLN: Freq: Once | INTRAVENOUS | Status: AC

## 2022-09-29 MED ORDER — SODIUM CHLORIDE 0.9 % IV SOLN
10.0000 mg | Freq: Once | INTRAVENOUS | Status: AC
  Administered 2022-09-29: 10 mg via INTRAVENOUS
  Filled 2022-09-29: qty 10

## 2022-09-29 MED FILL — Dexamethasone Sodium Phosphate Inj 100 MG/10ML: INTRAMUSCULAR | Qty: 1 | Status: AC

## 2022-09-29 NOTE — Progress Notes (Signed)
Adam Jordan is here today for follow up post radiation to the mediastinum, completed on 08/27/2022  Does the patient complain of any of the following: Pain: Denies (reports steroids that he receives during his infusion treatments have helped) his general pain)  Shortness of breath w/wo exertion: Reports mild congestion (thinks it may be more related to seasonal allergies) Cough: Denies Hemoptysis: Denies Pain with swallowing: Denies; reports the difficulty he had for about 2 weeks after completing radiation has resolved Swallowing/choking concerns: Denies Appetite: Reports a healthy appetite  Wt Readings from Last 3 Encounters:  10/01/22 158 lb 8 oz (71.9 kg)  09/22/22 157 lb (71.2 kg)  09/16/22 156 lb (70.8 kg)   Energy Level: Continues to deal with fatigue, but he has had a busy week with multiple infusion appointments Post radiation skin Changes: Report small patches of hypergimentation to chest/treatment field  Additional comments if applicable: Overall reports he's doing well and continues to tolerate systemic treatment well

## 2022-09-30 ENCOUNTER — Inpatient Hospital Stay: Payer: Medicare HMO

## 2022-09-30 VITALS — BP 125/66 | HR 69 | Temp 98.4°F | Resp 20

## 2022-09-30 DIAGNOSIS — C7951 Secondary malignant neoplasm of bone: Secondary | ICD-10-CM | POA: Diagnosis not present

## 2022-09-30 DIAGNOSIS — C3431 Malignant neoplasm of lower lobe, right bronchus or lung: Secondary | ICD-10-CM | POA: Diagnosis not present

## 2022-09-30 DIAGNOSIS — Z5112 Encounter for antineoplastic immunotherapy: Secondary | ICD-10-CM | POA: Diagnosis not present

## 2022-09-30 DIAGNOSIS — Z79620 Long term (current) use of immunosuppressive biologic: Secondary | ICD-10-CM | POA: Diagnosis not present

## 2022-09-30 MED ORDER — SODIUM CHLORIDE 0.9 % IV SOLN: Freq: Once | INTRAVENOUS | Status: AC

## 2022-09-30 MED ORDER — SODIUM CHLORIDE 0.9 % IV SOLN
10.0000 mg | Freq: Once | INTRAVENOUS | Status: AC
  Administered 2022-09-30: 10 mg via INTRAVENOUS
  Filled 2022-09-30: qty 10

## 2022-09-30 MED ORDER — SODIUM CHLORIDE 0.9 % IV SOLN
100.0000 mg/m2 | Freq: Once | INTRAVENOUS | Status: AC
  Administered 2022-09-30: 200 mg via INTRAVENOUS
  Filled 2022-09-30: qty 10

## 2022-09-30 MED ORDER — SODIUM CHLORIDE 0.9% FLUSH
10.0000 mL | INTRAVENOUS | Status: DC | PRN
  Administered 2022-09-30: 10 mL

## 2022-09-30 MED ORDER — HEPARIN SOD (PORK) LOCK FLUSH 100 UNIT/ML IV SOLN
500.0000 [IU] | Freq: Once | INTRAVENOUS | Status: AC | PRN
  Administered 2022-09-30: 500 [IU]

## 2022-09-30 NOTE — Patient Instructions (Signed)
[facility]  Discharge Instructions: Thank you for choosing [facility] to provide your oncology and hematology care.   If you have a lab appointment with the [facility], please go directly to the [facility] and check in at the registration area.   Wear comfortable clothing and clothing appropriate for easy access to any Portacath or PICC line.   We strive to give you quality time with your provider. You may need to reschedule your appointment if you arrive late (15 or more minutes).  Arriving late affects you and other patients whose appointments are after yours.  Also, if you miss three or more appointments without notifying the office, you may be dismissed from the clinic at the provider's discretion.      For prescription refill requests, have your pharmacy contact our office and allow 72 hours for refills to be completed.    Today you received the following chemotherapy and/or immunotherapy agent: Etoposide   To help prevent nausea and vomiting after your treatment, we encourage you to take your nausea medication as directed.  BELOW ARE SYMPTOMS THAT SHOULD BE REPORTED IMMEDIATELY: *FEVER GREATER THAN 100.4 F (38 C) OR HIGHER *CHILLS OR SWEATING *NAUSEA AND VOMITING THAT IS NOT CONTROLLED WITH YOUR NAUSEA MEDICATION *UNUSUAL SHORTNESS OF BREATH *UNUSUAL BRUISING OR BLEEDING *URINARY PROBLEMS (pain or burning when urinating, or frequent urination) *BOWEL PROBLEMS (unusual diarrhea, constipation, pain near the anus) TENDERNESS IN MOUTH AND THROAT WITH OR WITHOUT PRESENCE OF ULCERS (sore throat, sores in mouth, or a toothache) UNUSUAL RASH, SWELLING OR PAIN  UNUSUAL VAGINAL DISCHARGE OR ITCHING   Items with * indicate a potential emergency and should be followed up as soon as possible or go to the Emergency Department if any problems should occur.  Please show the CHEMOTHERAPY ALERT CARD or IMMUNOTHERAPY ALERT CARD at check-in  to the Emergency Department and triage nurse.  Should you have questions after your visit or need to cancel or reschedule your appointment, please contact [facility]  Dept: [phone]  and follow the prompts.  Office hours are 8:00 a.m. to 4:30 p.m. Monday - Friday. Please note that voicemails left after 4:00 p.m. may not be returned until the following business day.  We are closed weekends and major holidays. You have access to a nurse at all times for urgent questions. Please call the main number to the clinic Dept: [phone] and follow the prompts.   For any non-urgent questions, you may also contact your provider using MyChart. We now offer e-Visits for anyone 29 and older to request care online for non-urgent symptoms. For details visit [email or web address].   Also download the MyChart app! Go to the app store, search "MyChart", open the app, select [facility], and log in with your MyChart username and password.  Etoposide Capsules What is this medication? ETOPOSIDE (e toe POE side) treats lung cancer. It works by slowing down the growth of cancer cells. This medicine may be used for other purposes; ask your health care provider or pharmacist if you have questions. COMMON BRAND NAME(S): VePesid What should I tell my care team before I take this medication? They need to know if you have any of these conditions: Infection Kidney disease Liver disease Low blood counts, such as low white cell, platelet, red cell counts An unusual or allergic reaction to etoposide, other medications, foods, dyes, or preservatives Pregnant or trying to get pregnant Breastfeeding How should  I use this medication? Take this medication by mouth with a glass of water. Take it as directed on the prescription label. Do not cut, crush, or chew this medication. Swallow the capsules whole. Do not take it more often than directed. Keep taking it unless your care team tells  you to stop. Handling this medication may be harmful. Wear gloves while touching this medication or bottle. Talk to your care team about how to handle this medication. Special instructions may apply. Talk to your care team about the use of this medication in children. Special care may be needed. Overdosage: If you think you have taken too much of this medicine contact a poison control center or emergency room at once. NOTE: This medicine is only for you. Do not share this medicine with others. What if I miss a dose? If you miss a dose, take it as soon as you can. If it is almost time for your next dose, take only that dose. Do not take double or extra doses. What may interact with this medication? Cyclosporine Warfarin This list may not describe all possible interactions. Give your health care provider a list of all the medicines, herbs, non-prescription drugs, or dietary supplements you use. Also tell them if you smoke, drink alcohol, or use illegal drugs. Some items may interact with your medicine. What should I watch for while using this medication? Your condition will be monitored carefully while you are receiving this medication. This medication may make you feel generally unwell. This is not uncommon as chemotherapy can affect healthy cells as well as cancer cells. Report any side effects. Continue your course of treatment even though you feel ill unless your care team tells you to stop. This medication may increase your risk of getting an infection. Call your care team for advice if you get a fever, chills, sore throat, or other symptoms of a cold or flu. Do not treat yourself. Try to avoid being around people who are sick. This medication may increase your risk to bruise or bleed. Call your care team if you notice any unusual bleeding. Talk to your care team about your risk of cancer. You may be more at risk for certain types of cancers if you take this medication. Talk to your care team if  you may be pregnant. Serious birth defects can occur if you take this medication during pregnancy and for 6 months after the last dose. You will need a negative pregnancy test before starting this medication. Contraception is recommended while taking this medication and for 6 months after the last dose. Your care team can help you find the option that works for you. If your partner can get pregnant, use a condom during sex while taking this medication and for 4 months after the last dose. Do not breastfeed while taking this medication. This medication may cause infertility. Talk to your care team if you are concerned about your fertility. What side effects may I notice from receiving this medication? Side effects that you should report to your care team as soon as possible: Allergic reactions--skin rash, itching, hives, swelling of the face, lips, tongue, or throat Infection--fever, chills, cough, sore throat, wounds that don't heal, pain or trouble when passing urine, general feeling of discomfort or being unwell Low red blood cell level--unusual weakness or fatigue, dizziness, headache, trouble breathing Unusual bruising or bleeding Side effects that usually do not require medical attention (report to your care team if they continue or are bothersome): Diarrhea Fatigue  Hair loss Loss of appetite Nausea Pain, redness, or swelling with sores inside the mouth or throat Vomiting This list may not describe all possible side effects. Call your doctor for medical advice about side effects. You may report side effects to FDA at 1-800-FDA-1088. Where should I keep my medication? Keep out of the reach of children and pets. Store in a refrigerator between 2 and 8 degrees C (36 and 46 degrees F). Do not freeze. Get rid any unused medication after the expiration date. To get rid of medications that are no longer needed or have expired: Take the medication to a medication take-back program. Check with  your pharmacy or law enforcement to find a location. If you cannot return the medication, ask your pharmacist or care team how to get rid of this medication safely. NOTE: This sheet is a summary. It may not cover all possible information. If you have questions about this medicine, talk to your doctor, pharmacist, or health care provider.  2023 Elsevier/Gold Standard (2021-12-09 00:00:00)

## 2022-10-01 ENCOUNTER — Ambulatory Visit
Admission: RE | Admit: 2022-10-01 | Discharge: 2022-10-01 | Disposition: A | Discharge status: Home / Self Care | Payer: Medicare HMO | Source: Ambulatory Visit | Attending: Radiation Oncology | Admitting: Radiation Oncology

## 2022-10-01 VITALS — BP 147/71 | HR 68 | Temp 96.2°F | Resp 18 | Ht 68.0 in | Wt 158.5 lb

## 2022-10-01 DIAGNOSIS — R590 Localized enlarged lymph nodes: Secondary | ICD-10-CM | POA: Diagnosis not present

## 2022-10-01 DIAGNOSIS — K5730 Diverticulosis of large intestine without perforation or abscess without bleeding: Secondary | ICD-10-CM | POA: Diagnosis not present

## 2022-10-01 DIAGNOSIS — Z794 Long term (current) use of insulin: Secondary | ICD-10-CM | POA: Diagnosis not present

## 2022-10-01 DIAGNOSIS — R601 Generalized edema: Secondary | ICD-10-CM | POA: Insufficient documentation

## 2022-10-01 DIAGNOSIS — Z923 Personal history of irradiation: Secondary | ICD-10-CM | POA: Diagnosis not present

## 2022-10-01 DIAGNOSIS — Z7901 Long term (current) use of anticoagulants: Secondary | ICD-10-CM | POA: Insufficient documentation

## 2022-10-01 DIAGNOSIS — C3431 Malignant neoplasm of lower lobe, right bronchus or lung: Secondary | ICD-10-CM | POA: Diagnosis not present

## 2022-10-01 DIAGNOSIS — J432 Centrilobular emphysema: Secondary | ICD-10-CM | POA: Diagnosis not present

## 2022-10-02 ENCOUNTER — Other Ambulatory Visit: Payer: Self-pay

## 2022-10-02 ENCOUNTER — Encounter: Payer: Self-pay | Admitting: Radiation Oncology

## 2022-10-02 ENCOUNTER — Inpatient Hospital Stay: Payer: Medicare HMO | Attending: Internal Medicine

## 2022-10-02 VITALS — BP 155/72 | HR 77 | Temp 97.5°F | Resp 17

## 2022-10-02 DIAGNOSIS — Z79620 Long term (current) use of immunosuppressive biologic: Secondary | ICD-10-CM | POA: Insufficient documentation

## 2022-10-02 DIAGNOSIS — C3431 Malignant neoplasm of lower lobe, right bronchus or lung: Secondary | ICD-10-CM | POA: Diagnosis not present

## 2022-10-02 DIAGNOSIS — Z5189 Encounter for other specified aftercare: Secondary | ICD-10-CM | POA: Diagnosis not present

## 2022-10-02 DIAGNOSIS — Z5111 Encounter for antineoplastic chemotherapy: Secondary | ICD-10-CM | POA: Diagnosis not present

## 2022-10-02 DIAGNOSIS — C7951 Secondary malignant neoplasm of bone: Secondary | ICD-10-CM | POA: Insufficient documentation

## 2022-10-02 MED ORDER — PEGFILGRASTIM-JMDB 6 MG/0.6ML ~~LOC~~ SOSY
6.0000 mg | PREFILLED_SYRINGE | Freq: Once | SUBCUTANEOUS | Status: AC
  Administered 2022-10-02: 6 mg via SUBCUTANEOUS

## 2022-10-02 NOTE — Progress Notes (Signed)
Radiation Oncology         [phone] ________________________________  Name: Adam Jordan MRN: [id number]  Date: 10/01/2022  DOB: Sep 22, 1945  Follow-Up Visit Note  Outpatient  CC: Adam Morning, DO  Adam Morning, DO  Diagnosis and Prior Radiotherapy:    ICD-10-CM   1. Small cell carcinoma of lower lobe of right lung (HCC)  C34.31     2. Malignant neoplasm of lower lobe of right lung ([place])  C34.31       CHIEF COMPLAINT: Here for follow-up and surveillance of lung cancer  Narrative:  The patient returns today for routine follow-up.   Adam Jordan is here today for follow up post radiation to the mediastinum, completed on 08/27/2022 - I personally reviewed his restaging scans. Lung disease targeted with RT is better but there is progression elsewhere and he has changed drug regimens.  He remains upbeat and resilient.  Does the patient complain of any of the following: Pain: Denies (reports steroids that he receives during his infusion treatments have helped) his general pain)  Shortness of breath w/wo exertion: Reports mild congestion (thinks it may be more related to seasonal allergies) Cough: Denies Hemoptysis: Denies Pain with swallowing: Denies; reports the difficulty he had for about 2 weeks after completing radiation has resolved Swallowing/choking concerns: Denies Appetite: Reports a healthy appetite  Wt Readings from Last 3 Encounters:  10/01/22 158 lb 8 oz (71.9 kg)  09/22/22 157 lb (71.2 kg)  09/16/22 156 lb (70.8 kg)   Energy Level: Continues to deal with fatigue, but he has had a busy week with multiple infusion appointments Post radiation skin Changes: Report small patches of hypergimentation to chest/treatment field  Additional comments if applicable: Overall reports he's doing well and continues to tolerate systemic treatment well                            ALLERGIES:  is allergic to losartan, atorvastatin calcium, and pollen extract-tree extract  [pollen extract].  Meds: Current Outpatient Medications  Medication Sig Dispense Refill   Alum Hydroxide-Mag Trisilicate (GAVISCON) [id number] MG CHEW Chew 1 tablet by mouth daily as needed (heartburn).     apixaban (ELIQUIS) 5 MG TABS tablet Take 1 tablet (5 mg total) by mouth 2 (two) times daily. 180 tablet 0   atorvastatin (LIPITOR) 20 MG tablet Take 20 mg by mouth at bedtime.     blood glucose meter kit and supplies Dispense based on patient and insurance preference. Use up to four times daily as directed. (FOR ICD-10 E10.9, E11.9). (Patient taking differently: 1 each by Other route See admin instructions. Dispense based on patient and insurance preference. Use up to four times daily as directed. (FOR ICD-10 E10.9, E11.9).) 1 each 0   clonazePAM (KLONOPIN) 1 MG tablet Take 1 tablet (1 mg total) by mouth 2 (two) times daily as needed for anxiety. 60 tablet 5   Continuous Blood Gluc Sensor (DEXCOM G7 SENSOR) MISC 1 Device by Does not apply route as directed. 9 each 3   fluticasone (FLONASE) 50 MCG/ACT nasal spray Place 1 spray into both nostrils 2 (two) times daily as needed for allergies.     insulin aspart (NOVOLOG FLEXPEN) 100 UNIT/ML FlexPen Max daily 30 units (Patient taking differently: Inject 1 Units into the skin 3 (three) times daily with meals. Max daily 30 units) 15 mL 11   insulin glargine (LANTUS) 100 UNIT/ML Solostar Pen Inject 24 Units into the skin  daily. 30 mL 3   Insulin Pen Needle 32G X 4 MM MISC 1 Device by Does not apply route in the Jordan, at noon, in the evening, and at bedtime. 400 each 3   Lancets (ONETOUCH DELICA PLUS [id number]) MISC Apply 1 each topically 4 (four) times daily.     lidocaine (XYLOCAINE) 2 % solution Patient: Mix 1part 2% viscous lidocaine, 1part H20. Swallow 78m of diluted mixture, 321m before meals and at bedtime, up to QID (Patient taking differently: Use as directed 15 mLs in the mouth or throat See admin instructions. Patient: Mix 1part 2% viscous  lidocaine, 1part H20. Swallow 1012mf diluted mixture, 40m35mefore meals and at bedtime, up to QID) 100 mL 3   lidocaine-prilocaine (EMLA) cream Apply to the Port-A-Cath site 30-60 minutes before chemotherapy (Patient taking differently: Apply 1 Application topically See admin instructions. Apply to the Port-A-Cath site 30-60 minutes before chemotherapy) 30 g 0   meloxicam (MOBIC) 15 MG tablet Take 15 mg by mouth daily as needed for pain.     Multiple Vitamin (MULTIVITAMIN WITH MINERALS) TABS tablet Take 1 tablet by mouth daily. 30 tablet 5   Naphazoline HCl (CLEAR EYES OP) Place 1 drop into both eyes daily.     nitroGLYCERIN (NITROSTAT) 0.4 MG SL tablet Place 1 tablet (0.4 mg total) under the tongue every 5 (five) minutes as needed for chest pain. 25 tablet 6   ONETOUCH ULTRA test strip 1 each by Other route 3 (three) times daily. for testing 300 each 3   oxyCODONE-acetaminophen (PERCOCET/ROXICET) 5-325 MG tablet Take 1 tablet by mouth every 8 (eight) hours as needed for severe pain. 30 tablet 0   PROAIR HFA 108 (90 BASE) MCG/ACT inhaler Inhale 2 puffs into the lungs every 6 (six) hours as needed for wheezing or shortness of breath.   2   sildenafil (REVATIO) 20 MG tablet Take 60 mg by mouth daily as needed (ED).     Tiotropium Bromide-Olodaterol (STIOLTO RESPIMAT) 2.5-2.5 MCG/ACT AERS Inhale 2 puffs into the lungs daily. 4 g 0   traZODone (DESYREL) 50 MG tablet Take 50 mg by mouth at bedtime.     triamcinolone cream (KENALOG) 0.1 % Apply 1 Application topically daily.     No current facility-administered medications for this encounter.    Physical Findings: The patient is in no acute distress. Patient is alert and oriented.  height is '5\' 8"'$  (1.727 m) and weight is 158 lb 8 oz (71.9 kg). His temporal temperature is 96.2 F (35.7 C) (abnormal). His blood pressure is 147/71 (abnormal) and his pulse is 68. His respiration is 18 and oxygen saturation is 99%. .    General: Alert and oriented, in no  acute distress HEENT: Head is normocephalic. Extraocular movements are intact.  Heart: Regular in rate and rhythm with no murmurs, rubs, or gallops. Chest: Clear to auscultation bilaterally, with no rhonchi, wheezes, or rales. Abdomen: Soft, nontender, nondistended, with no rigidity or guarding. Musculoskeletal: symmetric strength and muscle tone throughout. Neurologic: Cranial nerves II through XII are grossly intact. No obvious focalities. Speech is fluent. Coordination is intact. Psychiatric: Judgment and insight are intact. Affect is appropriate. Skin: no RT skin changes on torso   Lab Findings: Lab Results  Component Value Date   WBC 7.9 09/28/2022   HGB 13.5 09/28/2022   HCT 39.6 09/28/2022   MCV 90.6 09/28/2022   PLT 144 (L) 09/28/2022    Radiographic Findings: CT Chest W Contrast  Result Date: 09/21/2022 CLINICAL DATA:  Staging small-cell lung cancer. Completed chemotherapy in October 2023. Finished radiation 2 weeks ago * Tracking Code: BO * EXAM: CT CHEST, ABDOMEN, AND PELVIS WITH CONTRAST TECHNIQUE: Multidetector CT imaging of the chest, abdomen and pelvis was performed following the standard protocol during bolus administration of intravenous contrast. RADIATION DOSE REDUCTION: This exam was performed according to the departmental dose-optimization program which includes automated exposure control, adjustment of the mA and/or kV according to patient size and/or use of iterative reconstruction technique. CONTRAST:  155m OMNIPAQUE IOHEXOL 300 MG/ML  SOLN COMPARISON:  07/22/2022 FINDINGS: CT CHEST FINDINGS Cardiovascular: Right upper chest port. Heart is nonenlarged. No pericardial effusion. Coronary artery calcifications are seen. Normal caliber thoracic aorta with scattered vascular calcifications. Slight ectasia of the aortic arch with diameter approaching up to 3.2 cm on series 2, image 25. There is a vascular stent in place along the lower descending thoracic aorta extending  into the abdomen. Mediastinum/Nodes: No specific abnormal lymph node enlargement identified in the axillary regions. None along the left hilum. Previously there were right hilar nodes identified which have significantly improved. Mediastinal nodes have also improved. Specific lesions will be followed for continuity. The previous pretracheal node is measured 2.3 x 2.1 cm and today on image 31 of series 2 measures 12 by 8 mm. Subcarinal lesion which measured 2.9 x 2.4 cm, today on series 2, image 36 measures 2.4 by 0.8 cm. No new lymph node enlargement identified. Lungs/Pleura: Centrilobular and paraseptal emphysematous changes are identified. No consolidation, pneumothorax or effusion. Mild dependent atelectasis noted bilaterally. The ill-defined nodule in the right lower lobe which previously measured 18 x 8 mm, today when measured in the same fashion on series 4, image 114 measures 16 by 8 mm. The smaller area just superior to this which previously measured 8 x 7 mm, today on image 104 of series 4 measures 7 by 7 mm. There is a small nodule just medial to this which is slightly larger on series 4, image 105 measuring 5 x 3 mm. Previously 4 x 2 mm. Few other smaller areas of nodularity in the right lower lobe are again seen such as image 126. There is lingular and middle lobe scarring atelectatic changes. Areas of bronchiectasis. Musculoskeletal: Slight curvature of the spine. Scattered degenerative changes. Streak artifact related to the patient's right shoulder arthroplasty. CT ABDOMEN PELVIS FINDINGS Hepatobiliary: Fatty liver infiltration identified. There is a new liver lesion identified inferiorly along the margin of segment 4B and 5 on series 2, image 77 measuring 18 by 11 mm. There is also a new tiny focus seen in segment 4A on image 63 of series 2 measuring 7 mm. Few other small foci elsewhere on image 65, 68 in the right hepatic lobe. These are worrisome for developing liver metastases. Patent portal vein.  Gallbladder is nondilated. Pancreas: Unremarkable. No pancreatic ductal dilatation or surrounding inflammatory changes. Spleen: Normal in size without focal abnormality. Adrenals/Urinary Tract: Right adrenal gland is preserved. The left is nodular and thickened, increased from previous. Example on image 68 of series 2 measures 16 by 13 mm. Previously this area would have measured 14 by 11 mm. No enhancing renal mass or collecting system dilatation. Ureters have normal course and caliber extending down to the bladder. Preserved contours of the urinary bladder. Stomach/Bowel: Moderate scattered colonic stool. Large bowel is nondilated. There is atypical distribution of bowel with colon and some small bowel extending between the liver in the anterior abdominal wall margin. Sigmoid colon diverticulosis. Stomach is nondilated. Normal  appendix. Of note the cecum is in the upper anterior right hemiabdomen between the liver in the anterior abdominal wall itself. Atypical distribution of bowel. Vascular/Lymphatic: Once again there is a aortic endograft in place with extension up into the distal descending thoracic aorta. Multiple stents as well extending along the SMA, celiac and bilateral renal arteries. No obvious endoleak or graft failure but the study is not performed as a dedicated endograft protocol. No specific abnormal lymph node enlargement seen in the abdomen and pelvis. Reproductive: Prostate is unremarkable. Other: Mild anasarca.  No definite ascites. Musculoskeletal: Curvature and degenerative changes along the spine. IMPRESSION: 1. Interval improvement in the mediastinal and right hilar lymphadenopathy. 2. Multiple small right lower lobe lung nodules. These are relatively similar. Only 1 tiny nodule is clearly larger today. 3. Developing new liver lesions worrisome for liver metastases. 4. Developing left adrenal mass worrisome for metastasis. 5. Emphysema. 6. Aortic endograft in place. No obvious endoleak or  graft failure but the study is not performed as a dedicated endograft protocol. Aortic Atherosclerosis (ICD10-I70.0) and Emphysema (ICD10-J43.9). Identified consistent with Results will be called to the ordering service by the Radiology physician assistant team Electronically Signed   By: Jill Side M.D.   On: 09/21/2022 11:55   CT Abdomen Pelvis W Contrast  Result Date: 09/21/2022 CLINICAL DATA:  Staging small-cell lung cancer. Completed chemotherapy in October 2023. Finished radiation 2 weeks ago * Tracking Code: BO * EXAM: CT CHEST, ABDOMEN, AND PELVIS WITH CONTRAST TECHNIQUE: Multidetector CT imaging of the chest, abdomen and pelvis was performed following the standard protocol during bolus administration of intravenous contrast. RADIATION DOSE REDUCTION: This exam was performed according to the departmental dose-optimization program which includes automated exposure control, adjustment of the mA and/or kV according to patient size and/or use of iterative reconstruction technique. CONTRAST:  127m OMNIPAQUE IOHEXOL 300 MG/ML  SOLN COMPARISON:  07/22/2022 FINDINGS: CT CHEST FINDINGS Cardiovascular: Right upper chest port. Heart is nonenlarged. No pericardial effusion. Coronary artery calcifications are seen. Normal caliber thoracic aorta with scattered vascular calcifications. Slight ectasia of the aortic arch with diameter approaching up to 3.2 cm on series 2, image 25. There is a vascular stent in place along the lower descending thoracic aorta extending into the abdomen. Mediastinum/Nodes: No specific abnormal lymph node enlargement identified in the axillary regions. None along the left hilum. Previously there were right hilar nodes identified which have significantly improved. Mediastinal nodes have also improved. Specific lesions will be followed for continuity. The previous pretracheal node is measured 2.3 x 2.1 cm and today on image 31 of series 2 measures 12 by 8 mm. Subcarinal lesion which  measured 2.9 x 2.4 cm, today on series 2, image 36 measures 2.4 by 0.8 cm. No new lymph node enlargement identified. Lungs/Pleura: Centrilobular and paraseptal emphysematous changes are identified. No consolidation, pneumothorax or effusion. Mild dependent atelectasis noted bilaterally. The ill-defined nodule in the right lower lobe which previously measured 18 x 8 mm, today when measured in the same fashion on series 4, image 114 measures 16 by 8 mm. The smaller area just superior to this which previously measured 8 x 7 mm, today on image 104 of series 4 measures 7 by 7 mm. There is a small nodule just medial to this which is slightly larger on series 4, image 105 measuring 5 x 3 mm. Previously 4 x 2 mm. Few other smaller areas of nodularity in the right lower lobe are again seen such as image 126. There is  lingular and middle lobe scarring atelectatic changes. Areas of bronchiectasis. Musculoskeletal: Slight curvature of the spine. Scattered degenerative changes. Streak artifact related to the patient's right shoulder arthroplasty. CT ABDOMEN PELVIS FINDINGS Hepatobiliary: Fatty liver infiltration identified. There is a new liver lesion identified inferiorly along the margin of segment 4B and 5 on series 2, image 77 measuring 18 by 11 mm. There is also a new tiny focus seen in segment 4A on image 63 of series 2 measuring 7 mm. Few other small foci elsewhere on image 65, 68 in the right hepatic lobe. These are worrisome for developing liver metastases. Patent portal vein. Gallbladder is nondilated. Pancreas: Unremarkable. No pancreatic ductal dilatation or surrounding inflammatory changes. Spleen: Normal in size without focal abnormality. Adrenals/Urinary Tract: Right adrenal gland is preserved. The left is nodular and thickened, increased from previous. Example on image 68 of series 2 measures 16 by 13 mm. Previously this area would have measured 14 by 11 mm. No enhancing renal mass or collecting system  dilatation. Ureters have normal course and caliber extending down to the bladder. Preserved contours of the urinary bladder. Stomach/Bowel: Moderate scattered colonic stool. Large bowel is nondilated. There is atypical distribution of bowel with colon and some small bowel extending between the liver in the anterior abdominal wall margin. Sigmoid colon diverticulosis. Stomach is nondilated. Normal appendix. Of note the cecum is in the upper anterior right hemiabdomen between the liver in the anterior abdominal wall itself. Atypical distribution of bowel. Vascular/Lymphatic: Once again there is a aortic endograft in place with extension up into the distal descending thoracic aorta. Multiple stents as well extending along the SMA, celiac and bilateral renal arteries. No obvious endoleak or graft failure but the study is not performed as a dedicated endograft protocol. No specific abnormal lymph node enlargement seen in the abdomen and pelvis. Reproductive: Prostate is unremarkable. Other: Mild anasarca.  No definite ascites. Musculoskeletal: Curvature and degenerative changes along the spine. IMPRESSION: 1. Interval improvement in the mediastinal and right hilar lymphadenopathy. 2. Multiple small right lower lobe lung nodules. These are relatively similar. Only 1 tiny nodule is clearly larger today. 3. Developing new liver lesions worrisome for liver metastases. 4. Developing left adrenal mass worrisome for metastasis. 5. Emphysema. 6. Aortic endograft in place. No obvious endoleak or graft failure but the study is not performed as a dedicated endograft protocol. Aortic Atherosclerosis (ICD10-I70.0) and Emphysema (ICD10-J43.9). Identified consistent with Results will be called to the ordering service by the Radiology physician assistant team Electronically Signed   By: Jill Side M.D.   On: 09/21/2022 11:55    Impression/Plan:  Good response to radiation therapy - healing well from that.  Continue close attention  and treatment in med/onc.  I will see him back PRN.  I wished him the very best.  On date of service, in total, I spent 25 minutes on this encounter. Patient was seen in person.  _____________________________________   Eppie Gibson, MD

## 2022-10-05 ENCOUNTER — Inpatient Hospital Stay: Payer: Medicare HMO

## 2022-10-05 DIAGNOSIS — C7951 Secondary malignant neoplasm of bone: Secondary | ICD-10-CM | POA: Diagnosis not present

## 2022-10-05 DIAGNOSIS — C3431 Malignant neoplasm of lower lobe, right bronchus or lung: Secondary | ICD-10-CM | POA: Diagnosis not present

## 2022-10-05 DIAGNOSIS — Z79620 Long term (current) use of immunosuppressive biologic: Secondary | ICD-10-CM | POA: Diagnosis not present

## 2022-10-05 DIAGNOSIS — Z95828 Presence of other vascular implants and grafts: Secondary | ICD-10-CM

## 2022-10-05 DIAGNOSIS — Z5111 Encounter for antineoplastic chemotherapy: Secondary | ICD-10-CM | POA: Diagnosis not present

## 2022-10-05 DIAGNOSIS — Z5189 Encounter for other specified aftercare: Secondary | ICD-10-CM | POA: Diagnosis not present

## 2022-10-05 LAB — CMP (CANCER CENTER ONLY)
ALT: 16 U/L (ref 0–44)
AST: 11 U/L — ABNORMAL LOW (ref 15–41)
Albumin: 4 g/dL (ref 3.5–5.0)
Alkaline Phosphatase: 79 U/L (ref 38–126)
Anion gap: 6 (ref 5–15)
BUN: 19 mg/dL (ref 8–23)
CO2: 27 mmol/L (ref 22–32)
Calcium: 8.9 mg/dL (ref 8.9–10.3)
Chloride: 99 mmol/L (ref 98–111)
Creatinine: 0.78 mg/dL (ref 0.61–1.24)
GFR, Estimated: >60 mL/min (ref >60)
Glucose, Bld: 353 mg/dL — ABNORMAL HIGH (ref 70–99)
Potassium: 4.6 mmol/L (ref 3.5–5.1)
Sodium: 132 mmol/L — ABNORMAL LOW (ref 135–145)
Total Bilirubin: 1 mg/dL (ref 0.3–1.2)
Total Protein: 6.5 g/dL (ref 6.5–8.1)

## 2022-10-05 LAB — CBC WITH DIFFERENTIAL (CANCER CENTER ONLY)
Abs Immature Granulocytes: 0 10*3/uL (ref 0.00–0.07)
Basophils Absolute: 0.1 10*3/uL (ref 0.0–0.1)
Basophils Relative: 1 %
Eosinophils Absolute: 0.1 10*3/uL (ref 0.0–0.5)
Eosinophils Relative: 2 %
HCT: 36.6 % — ABNORMAL LOW (ref 39.0–52.0)
Hemoglobin: 12.9 g/dL — ABNORMAL LOW (ref 13.0–17.0)
Lymphocytes Relative: 14 %
Lymphs Abs: 1 10*3/uL (ref 0.7–4.0)
MCH: 31.5 pg (ref 26.0–34.0)
MCHC: 35.2 g/dL (ref 30.0–36.0)
MCV: 89.3 fL (ref 80.0–100.0)
Monocytes Absolute: 0.1 10*3/uL (ref 0.1–1.0)
Monocytes Relative: 2 %
Neutro Abs: 6 10*3/uL (ref 1.7–7.7)
Neutrophils Relative %: 81 %
Platelet Count: 54 10*3/uL — ABNORMAL LOW (ref 150–400)
RBC: 4.1 MIL/uL — ABNORMAL LOW (ref 4.22–5.81)
RDW: 13.5 % (ref 11.5–15.5)
WBC Count: 7.4 10*3/uL (ref 4.0–10.5)
nRBC: 0 % (ref 0.0–0.2)

## 2022-10-05 LAB — TSH: TSH: 1.062 u[IU]/mL (ref 0.350–4.500)

## 2022-10-05 MED ORDER — HEPARIN SOD (PORK) LOCK FLUSH 100 UNIT/ML IV SOLN
500.0000 [IU] | Freq: Once | INTRAVENOUS | Status: AC
  Administered 2022-10-05: 500 [IU]

## 2022-10-05 MED ORDER — SODIUM CHLORIDE 0.9% FLUSH
10.0000 mL | Freq: Once | INTRAVENOUS | Status: AC
  Administered 2022-10-05: 10 mL

## 2022-10-11 ENCOUNTER — Other Ambulatory Visit: Payer: Self-pay | Admitting: Internal Medicine

## 2022-10-11 MED ORDER — OXYCODONE-ACETAMINOPHEN 5-325 MG PO TABS: 1.0000 | ORAL_TABLET | Freq: Three times a day (TID) | ORAL | 0 refills | Status: DC | PRN

## 2022-10-12 ENCOUNTER — Inpatient Hospital Stay: Payer: Medicare HMO

## 2022-10-12 DIAGNOSIS — Z5111 Encounter for antineoplastic chemotherapy: Secondary | ICD-10-CM | POA: Diagnosis not present

## 2022-10-12 DIAGNOSIS — Z5189 Encounter for other specified aftercare: Secondary | ICD-10-CM | POA: Diagnosis not present

## 2022-10-12 DIAGNOSIS — Z95828 Presence of other vascular implants and grafts: Secondary | ICD-10-CM

## 2022-10-12 DIAGNOSIS — C3431 Malignant neoplasm of lower lobe, right bronchus or lung: Secondary | ICD-10-CM | POA: Diagnosis not present

## 2022-10-12 DIAGNOSIS — C7951 Secondary malignant neoplasm of bone: Secondary | ICD-10-CM | POA: Diagnosis not present

## 2022-10-12 DIAGNOSIS — Z79620 Long term (current) use of immunosuppressive biologic: Secondary | ICD-10-CM | POA: Diagnosis not present

## 2022-10-12 LAB — CMP (CANCER CENTER ONLY)
ALT: 13 U/L (ref 0–44)
AST: 12 U/L — ABNORMAL LOW (ref 15–41)
Albumin: 3.9 g/dL (ref 3.5–5.0)
Alkaline Phosphatase: 111 U/L (ref 38–126)
Anion gap: 6 (ref 5–15)
BUN: 14 mg/dL (ref 8–23)
CO2: 27 mmol/L (ref 22–32)
Calcium: 9.3 mg/dL (ref 8.9–10.3)
Chloride: 102 mmol/L (ref 98–111)
Creatinine: 1.07 mg/dL (ref 0.61–1.24)
GFR, Estimated: >60 mL/min (ref >60)
Glucose, Bld: 299 mg/dL — ABNORMAL HIGH (ref 70–99)
Potassium: 4.4 mmol/L (ref 3.5–5.1)
Sodium: 135 mmol/L (ref 135–145)
Total Bilirubin: 0.4 mg/dL (ref 0.3–1.2)
Total Protein: 6.4 g/dL — ABNORMAL LOW (ref 6.5–8.1)

## 2022-10-12 LAB — CBC WITH DIFFERENTIAL (CANCER CENTER ONLY)
Abs Immature Granulocytes: 0.61 10*3/uL — ABNORMAL HIGH (ref 0.00–0.07)
Basophils Absolute: 0.1 10*3/uL (ref 0.0–0.1)
Basophils Relative: 1 %
Eosinophils Absolute: 0.1 10*3/uL (ref 0.0–0.5)
Eosinophils Relative: 0 %
HCT: 35.8 % — ABNORMAL LOW (ref 39.0–52.0)
Hemoglobin: 12 g/dL — ABNORMAL LOW (ref 13.0–17.0)
Immature Granulocytes: 4 %
Lymphocytes Relative: 7 %
Lymphs Abs: 1.2 10*3/uL (ref 0.7–4.0)
MCH: 30.7 pg (ref 26.0–34.0)
MCHC: 33.5 g/dL (ref 30.0–36.0)
MCV: 91.6 fL (ref 80.0–100.0)
Monocytes Absolute: 1.4 10*3/uL — ABNORMAL HIGH (ref 0.1–1.0)
Monocytes Relative: 8 %
Neutro Abs: 13.7 10*3/uL — ABNORMAL HIGH (ref 1.7–7.7)
Neutrophils Relative %: 80 %
Platelet Count: 129 10*3/uL — ABNORMAL LOW (ref 150–400)
RBC: 3.91 MIL/uL — ABNORMAL LOW (ref 4.22–5.81)
RDW: 14.6 % (ref 11.5–15.5)
WBC Count: 17.1 10*3/uL — ABNORMAL HIGH (ref 4.0–10.5)
nRBC: 0 % (ref 0.0–0.2)

## 2022-10-12 MED ORDER — SODIUM CHLORIDE 0.9% FLUSH
10.0000 mL | Freq: Once | INTRAVENOUS | Status: AC
  Administered 2022-10-12: 10 mL

## 2022-10-12 MED ORDER — HEPARIN SOD (PORK) LOCK FLUSH 100 UNIT/ML IV SOLN
500.0000 [IU] | Freq: Once | INTRAVENOUS | Status: AC
  Administered 2022-10-12: 500 [IU]

## 2022-10-13 DIAGNOSIS — E109 Type 1 diabetes mellitus without complications: Secondary | ICD-10-CM | POA: Diagnosis not present

## 2022-10-14 ENCOUNTER — Ambulatory Visit (INDEPENDENT_AMBULATORY_CARE_PROVIDER_SITE_OTHER): Payer: Medicare HMO | Admitting: Internal Medicine

## 2022-10-14 ENCOUNTER — Encounter: Payer: Self-pay | Admitting: Internal Medicine

## 2022-10-14 VITALS — BP 120/74 | HR 74 | Ht 68.0 in | Wt 155.0 lb

## 2022-10-14 DIAGNOSIS — E1065 Type 1 diabetes mellitus with hyperglycemia: Secondary | ICD-10-CM | POA: Diagnosis not present

## 2022-10-14 DIAGNOSIS — E1042 Type 1 diabetes mellitus with diabetic polyneuropathy: Secondary | ICD-10-CM

## 2022-10-14 MED ORDER — METFORMIN HCL ER 500 MG PO TB24: 500.0000 mg | ORAL_TABLET | Freq: Two times a day (BID) | ORAL | 3 refills | Status: DC

## 2022-10-14 NOTE — Progress Notes (Signed)
Name: Adam Jordan  MRN/ DOB: [id number], 04-Sep-1945   Age/ Sex: 77 y.o., male    PCP: [name], DO   Reason for Endocrinology Evaluation:  Diabetes Mellitus     Date of Initial Endocrinology Visit: 06/09/2022    PATIENT IDENTIFIER: Adam Jordan is a 77 y.o. male with a past medical history of DM, COPD, HTN , Dyslipidemia , small lung ca. (Dx 01/2022). The patient presented for initial endocrinology clinic visit on 06/09/2022 for consultative assistance with his diabetes management.    HPI: Adam Jordan was    Diagnosed with DM 05/2022 Patient was diagnosed with immune mediated diabetes mellitus through oncology while being treated for small cell lung cancer while on chemotherapy   Hemoglobin A1c 10.1%  in 05/2022.   SUBJECTIVE:   During the last visit (09/16/2022): A1c 9.6%     Today (10/14/22):Adam Jordan is here for a follow up on diabetes management. He checks his blood sugars multiple  times daily. The patient has  had hypoglycemic episodes since the last clinic visit, which typically occur after a bolus   He had a follow up with cardiology 09/07/2022 Completed radiation treatment 08/27/2022 for lung ca. Continues on Imfinzi infusions   Restarted chemo and has been feeling tired and achy  Denies vomiting or constipation or diarrhea    HOME DIABETES REGIMEN: Lantus 24 units daily Novolog 8 units TIDQAC  Novolog 4 units with a snack  CF : Novolog (BG-130/50)      Statin: yes ACE-I/ARB: No - anaphylactic to Losartan Prior Diabetic Education: no  CONTINUOUS GLUCOSE MONITORING RECORD INTERPRETATION    Dates of Recording: 3/1- 10/14/2022  Sensor description: Dexcom  Results statistics:   CGM use % of time 93  Average and SD 208/82  Time in range    36    %  % Time Above 180 26  % Time above 250 34  % Time Below target 3   Glycemic patterns summary: BG's are at the upper level of normal during the night and fluctuate drastically during the  day  Hyperglycemic episodes postprandial  Hypoglycemic episodes occurred following a bolus  Overnight periods: Stable    DIABETIC COMPLICATIONS: Microvascular complications:  He has chronic neuropathy prior to diagnosis of diabetes Denies: CKD  Last eye exam:  Macrovascular complications:   Denies: CAD, PVD, CVA   PAST HISTORY: Past Medical History:  Past Medical History:  Diagnosis Date   Anxiety    Arthritis    osteoarthrititis- knees and most joints.   COPD (chronic obstructive pulmonary disease) (HCC)    moderate -no regular use of inhalers- rare use of oxygen as sexual activity   Dyspnea    outside in hot weather and also with pollen   Elevated blood-pressure reading, without diagnosis of hypertension 07/31/2019   Encounter for antineoplastic chemotherapy 03/04/2022   Encounter for antineoplastic immunotherapy 03/04/2022   History of heart artery stent 11/04/2021   Hypercholesterolemia 11/05/2013   Hyperlipidemia    Hypertension    Malaise and fatigue 04/19/2013   Neuromuscular disorder ([place])    feet   Non-recurrent unilateral inguinal hernia without obstruction or gangrene 07/21/2021   Paroxysmal atrial flutter ([place]) 01/15/2021   Port-A-Cath in place 03/31/2022   Routine history and physical examination of adult 11/05/2013   Shoulder joint pain 08/23/2013   Status post lumbar microdiscectomy 07/31/2019   Thoracoabdominal aneurysm [facility])    s/p FEVAR 4 Vessel TABME 02/19/20 Dr. Katy Apo   Past Surgical History:  Past Surgical History:  Procedure Laterality Date   ABDOMINAL AORTIC ANEURYSM REPAIR  02/06/2020   BRONCHIAL BIOPSY  02/23/2022   Procedure: BRONCHIAL BIOPSIES;  Surgeon: Collene Gobble, MD;  Location: [facility] ENDOSCOPY;  Service: Pulmonary;;   BRONCHIAL BRUSHINGS  02/23/2022   Procedure: BRONCHIAL BRUSHINGS;  Surgeon: Collene Gobble, MD;  Location: [facility] ENDOSCOPY;  Service: Pulmonary;;   BRONCHIAL NEEDLE ASPIRATION BIOPSY  02/23/2022   Procedure: BRONCHIAL NEEDLE  ASPIRATION BIOPSIES;  Surgeon: Collene Gobble, MD;  Location: [facility] ENDOSCOPY;  Service: Pulmonary;;   BROW LIFT Bilateral 12/11/2021   Procedure: UPPER LID BLEPHAROPLASTY;  Surgeon: Irene Limbo, MD;  Location: [place];  Service: Plastics;  Laterality: Bilateral;   CYST REMOVAL LEG Left 07/03/2021   Procedure: EXCISION CYST LEFT BUTTOCK;  Surgeon: Jovita Kussmaul, MD;  Location: [place];  Service: General;  Laterality: Left;   EYE SURGERY Bilateral    cataract surgery   FINGER ARTHROPLASTY Left    left thumb-Dr. Amedeo Plenty   INGUINAL HERNIA REPAIR Left 07/03/2021   Procedure: LEFT INGUINAL HERNIA REPAIR WITH MESH;  Surgeon: Jovita Kussmaul, MD;  Location: [place];  Service: General;  Laterality: Left;   INSERTION OF MESH N/A 07/03/2021   Procedure: INSERTION OF MESH X2;  Surgeon: Jovita Kussmaul, MD;  Location: [place];  Service: General;  Laterality: N/A;   IR IMAGING GUIDED PORT INSERTION  03/12/2022   IR RADIOLOGIST EVAL & MGMT  10/25/2019   KNEE ARTHROSCOPY Left    KNEE SURGERY Left    Bakers cyst x2   SHOULDER ARTHROSCOPY Right    thumb surgery     TONSILLECTOMY     TOTAL KNEE ARTHROPLASTY Left 03/19/2016   Procedure: LEFT TOTAL KNEE ARTHROPLASTY;  Surgeon: Sydnee Cabal, MD;  Location: [facility];  Service: Orthopedics;  Laterality: Left;   TOTAL SHOULDER REPLACEMENT Right    UMBILICAL HERNIA REPAIR N/A 07/03/2021   Procedure: UMBILICAL HERNIA REPAIR WITH MESH;  Surgeon: Jovita Kussmaul, MD;  Location: [facility];  Service: General;  Laterality: N/A;   VASCULAR SURGERY     VASECTOMY     VIDEO BRONCHOSCOPY WITH ENDOBRONCHIAL ULTRASOUND Right 02/23/2022   Procedure: VIDEO BRONCHOSCOPY WITH ENDOBRONCHIAL ULTRASOUND;  Surgeon: Collene Gobble, MD;  Location[facility] ENDOSCOPY;  Service: Pulmonary;  Laterality: Right;   VIDEO BRONCHOSCOPY WITH RADIAL ENDOBRONCHIAL ULTRASOUND  02/23/2022   Procedure: VIDEO BRONCHOSCOPY WITH RADIAL ENDOBRONCHIAL ULTRASOUND;  Surgeon: Collene Gobble, MD;  Location:  [place] ENDOSCOPY;  Service: Pulmonary;;    Social History:  reports that he quit smoking about 4 years ago. His smoking use included cigarettes. He has never used smokeless tobacco. He reports that he does not currently use alcohol. He reports that he does not use drugs. Family History:  Family History  Problem Relation Age of Onset   Ovarian cancer Mother    Breast cancer Mother    Macular degeneration Mother    Heart attack Father      HOME MEDICATIONS: Allergies as of 10/14/2022       Reactions   Losartan Anaphylaxis   Atorvastatin Calcium    Causes agitation at 40 mg, tolerates 20 mg   Pollen Extract-tree Extract [pollen Extract]         Medication List        Accurate as of October 14, 2022 12:13 PM. If you have any questions, ask your nurse or doctor.          apixaban 5 MG Tabs tablet  Commonly known as: ELIQUIS Take 1 tablet (5 mg total) by mouth 2 (two) times daily.   atorvastatin 20 MG tablet Commonly known as: LIPITOR Take 20 mg by mouth at bedtime.   blood glucose meter kit and supplies Dispense based on patient and insurance preference. Use up to four times daily as directed. (FOR ICD-10 E10.9, E11.9). What changed:  how much to take how to take this when to take this   CLEAR EYES OP Place 1 drop into both eyes daily.   clonazePAM 1 MG tablet Commonly known as: KLONOPIN Take 1 tablet (1 mg total) by mouth 2 (two) times daily as needed for anxiety.   Dexcom G7 Sensor Misc 1 Device by Does not apply route as directed.   fluticasone 50 MCG/ACT nasal spray Commonly known as: FLONASE Place 1 spray into both nostrils 2 (two) times daily as needed for allergies.   Gaviscon 80-14.2 MG Chew Generic drug: Alum Hydroxide-Mag Trisilicate Chew 1 tablet by mouth daily as needed (heartburn).   insulin glargine 100 UNIT/ML Solostar Pen Commonly known as: LANTUS Inject 24 Units into the skin daily.   Insulin Pen Needle 32G X 4 MM Misc 1 Device by Does not  apply route in the morning, at noon, in the evening, and at bedtime.   lidocaine 2 % solution Commonly known as: XYLOCAINE Patient: Mix 1part 2% viscous lidocaine, 1part H20. Swallow 68mL of diluted mixture, 46min before meals and at bedtime, up to QID What changed:  how much to take how to take this when to take this   lidocaine-prilocaine cream Commonly known as: EMLA Apply to the Port-A-Cath site 30-60 minutes before chemotherapy What changed:  how much to take how to take this when to take this   meloxicam 15 MG tablet Commonly known as: MOBIC Take 15 mg by mouth daily as needed for pain.   multivitamin with minerals Tabs tablet Take 1 tablet by mouth daily.   nitroGLYCERIN 0.4 MG SL tablet Commonly known as: NITROSTAT Place 1 tablet (0.4 mg total) under the tongue every 5 (five) minutes as needed for chest pain.   NovoLOG FlexPen 100 UNIT/ML FlexPen Generic drug: insulin aspart Max daily 30 units What changed:  how much to take how to take this when to take this   OneTouch Delica Plus 0000000 Misc Apply 1 each topically 4 (four) times daily.   OneTouch Ultra test strip Generic drug: glucose blood 1 each by Other route 3 (three) times daily. for testing   oxyCODONE-acetaminophen 5-325 MG tablet Commonly known as: PERCOCET/ROXICET Take 1 tablet by mouth every 8 (eight) hours as needed for severe pain.   ProAir HFA 108 (90 Base) MCG/ACT inhaler Generic drug: albuterol Inhale 2 puffs into the lungs every 6 (six) hours as needed for wheezing or shortness of breath.   sildenafil 20 MG tablet Commonly known as: REVATIO Take 60 mg by mouth daily as needed (ED).   Stiolto Respimat 2.5-2.5 MCG/ACT Aers Generic drug: Tiotropium Bromide-Olodaterol Inhale 2 puffs into the lungs daily.   traZODone 50 MG tablet Commonly known as: DESYREL Take 50 mg by mouth at bedtime.   triamcinolone cream 0.1 % Commonly known as: KENALOG Apply 1 Application topically  daily.         ALLERGIES: Allergies  Allergen Reactions   Losartan Anaphylaxis   Atorvastatin Calcium     Causes agitation at 40 mg, tolerates 20 mg   Pollen Extract-Tree Extract [Pollen Extract]      REVIEW OF SYSTEMS:  A comprehensive ROS was conducted with the patient and is negative except as per HPI     OBJECTIVE:   VITAL SIGNS: BP 120/74 (BP Location: Left Arm, Patient Position: Sitting, Cuff Size: Small)   Pulse 74   Ht 5\' 8"  (1.727 m)   Wt 155 lb (70.3 kg)   SpO2 98%   BMI 23.57 kg/m    PHYSICAL EXAM:  General: Adam Jordan appears well and is in NAD  Extremities:  Lower extremities - No pretibial edema.   Neuro: MS is good with appropriate affect, Adam Jordan is alert and Ox3   DM Foot Exam 10/14/2022   The skin of the feet is without sores or ulcerations, left great toe deformity with onychomycosis  The pedal pulses are 1+ on right and 1+ on left. The sensation is decreased to a screening 5.07, 10 gram monofilament bilaterally  DATA REVIEWED:  Lab Results  Component Value Date   HGBA1C 9.8 (A) 09/16/2022   HGBA1C 10.1 (H) 05/27/2022   Lab Results  Component Value Date   CREATININE 1.07 10/12/2022     ASSESSMENT / PLAN / RECOMMENDATIONS:   1) Type 1 Diabetes Mellitus, Poorly controlled - Most recent A1c of 9.6 %. Goal A1c < 7.0 %.     - Bg's have been improving with average BG's have trended down by ~100 points -I have recommended starting metformin -In reviewing Dexcom data, the patient has inconsistent intake of NovoLog at times he will take it appropriately before the meal and at times is after the meal which has resulted in hypoglycemia -I again emphasized the importance of taking his insulin before the meal -I will also adjust his sensitivity factor from 30 to 35  MEDICATIONS: Start metformin 500 mg twice daily Continue Lantus 24 units daily Continue NovoLog to 8 units 3 times daily before every meal Start correction scale : NovoLog (BG -130/35) before  each meal  EDUCATION / INSTRUCTIONS: BG monitoring instructions: Patient is instructed to check his blood sugars 3 times a day, before each meal. Call [facility] if: BG persistently < 70  I reviewed the Rule of 15 for the treatment of hypoglycemia in detail with the patient. Literature supplied.   2) Diabetic complications:  Eye: Does not have known diabetic retinopathy.  Neuro/ Feet: Does  have known peripheral neuropathy, prior to his diagnosis of diabetes Renal: Patient does not have known baseline CKD. He is not on an ACEI/ARB at present.    F/U in 4 months  Signed electronically by: Mack Guise, MD  [facility]  [facility] [address] [place], [place] [address] Phone: [phone] FAX: [phone]   CC: [name], [place] [address] Phone: [phone]  Fax: [phone]    Return to Endocrinology clinic as below: Future Appointments  Date Time Provider [place]  10/19/2022  1:15 PM [place] None  10/19/2022  1:45 PM Curt Bears, MD [facility] None  10/19/2022  2:30 PM [facility] INFUSION [facility] None  10/20/2022  1:30 PM [facility] INFUSION [facility] None  10/21/2022  1:30 PM [facility] INFUSION [facility] None  10/23/2022 12:30 PM [facility] [place] FLUSH [facility] None  10/26/2022  1:30 PM [facility] [place] FLUSH [facility] None  11/02/2022  1:00 PM [place] FLUSH [facility] None  11/09/2022  8:30 AM [facility] [place] FLUSH [facility] None  11/09/2022  9:00 AM Heilingoetter, Cassandra L, PA-C [facility] None  11/09/2022 10:15 AM [facility] INFUSION [facility] None  11/10/2022  1:00 PM [facility] INFUSION [facility] None  11/11/2022  1:30 PM [facility] INFUSION [facility] None  11/13/2022 12:30 PM [facility] [place] FLUSH [facility] None  11/16/2022  1:30 PM [facility] [place] FLUSH [facility] None  11/30/2022  9:30 AM [facility] [place] FLUSH  [facility] None  11/30/2022 10:00 AM Curt Bears, MD [facility] None  11/30/2022 11:00 AM [facility] INFUSION [facility] None  12/01/2022  1:00 PM [facility] INFUSION [facility] None  12/02/2022  1:00 PM [facility] INFUSION [facility] None  12/04/2022 12:30 PM [facility] FLUSH [facility] None

## 2022-10-14 NOTE — Patient Instructions (Addendum)
Start Metformin 500 mg 1 tablet with breakfast and 1 tablet before Supper Continue  Lantus 24 units daily  Continue Novolog 8 units with each meal  Novolog correctional insulin: ADD extra units on insulin to your meal-time Humalog dose if your blood sugars are higher than 170. Use the scale below to help guide you:   Blood sugar before meal Number of units to inject  Less than 170 0 unit  171 - 210 1 units  211 - 250 2 units  251 - 290 3 units  291 - 330 4 units  331 - 370 5 units  371 - 410 6 units     HOW TO TREAT LOW BLOOD SUGARS (Blood sugar LESS THAN 70 MG/DL) Please follow the RULE OF 15 for the treatment of hypoglycemia treatment (when your (blood sugars are less than 70 mg/dL)   STEP 1: Take 15 grams of carbohydrates when your blood sugar is low, which includes:  3-4 GLUCOSE TABS  OR 3-4 OZ OF JUICE OR REGULAR SODA OR ONE TUBE OF GLUCOSE GEL    STEP 2: RECHECK blood sugar in 15 MINUTES STEP 3: If your blood sugar is still low at the 15 minute recheck --> then, go back to STEP 1 and treat AGAIN with another 15 grams of carbohydrates.

## 2022-10-18 MED FILL — Fosaprepitant Dimeglumine For IV Infusion 150 MG (Base Eq): INTRAVENOUS | Qty: 5 | Status: AC

## 2022-10-18 MED FILL — Dexamethasone Sodium Phosphate Inj 100 MG/10ML: INTRAMUSCULAR | Qty: 1 | Status: AC

## 2022-10-19 ENCOUNTER — Inpatient Hospital Stay: Payer: Medicare HMO

## 2022-10-19 ENCOUNTER — Inpatient Hospital Stay (HOSPITAL_BASED_OUTPATIENT_CLINIC_OR_DEPARTMENT_OTHER): Payer: Medicare HMO | Admitting: Internal Medicine

## 2022-10-19 ENCOUNTER — Other Ambulatory Visit: Payer: Medicare HMO

## 2022-10-19 ENCOUNTER — Encounter: Payer: Self-pay | Admitting: Internal Medicine

## 2022-10-19 VITALS — BP 144/83 | HR 94 | Temp 98.3°F | Resp 20 | Wt 154.1 lb

## 2022-10-19 VITALS — BP 145/68 | HR 86 | Resp 18

## 2022-10-19 DIAGNOSIS — C3431 Malignant neoplasm of lower lobe, right bronchus or lung: Secondary | ICD-10-CM

## 2022-10-19 DIAGNOSIS — Z79620 Long term (current) use of immunosuppressive biologic: Secondary | ICD-10-CM | POA: Diagnosis not present

## 2022-10-19 DIAGNOSIS — Z5189 Encounter for other specified aftercare: Secondary | ICD-10-CM | POA: Diagnosis not present

## 2022-10-19 DIAGNOSIS — Z5111 Encounter for antineoplastic chemotherapy: Secondary | ICD-10-CM | POA: Diagnosis not present

## 2022-10-19 DIAGNOSIS — Z95828 Presence of other vascular implants and grafts: Secondary | ICD-10-CM

## 2022-10-19 DIAGNOSIS — C7951 Secondary malignant neoplasm of bone: Secondary | ICD-10-CM | POA: Diagnosis not present

## 2022-10-19 DIAGNOSIS — C3490 Malignant neoplasm of unspecified part of unspecified bronchus or lung: Secondary | ICD-10-CM | POA: Diagnosis not present

## 2022-10-19 LAB — CMP (CANCER CENTER ONLY)
ALT: 11 U/L (ref 0–44)
AST: 13 U/L — ABNORMAL LOW (ref 15–41)
Albumin: 3.9 g/dL (ref 3.5–5.0)
Alkaline Phosphatase: 93 U/L (ref 38–126)
Anion gap: 7 (ref 5–15)
BUN: 15 mg/dL (ref 8–23)
CO2: 27 mmol/L (ref 22–32)
Calcium: 9.5 mg/dL (ref 8.9–10.3)
Chloride: 102 mmol/L (ref 98–111)
Creatinine: 0.8 mg/dL (ref 0.61–1.24)
GFR, Estimated: >60 mL/min (ref >60)
Glucose, Bld: 229 mg/dL — ABNORMAL HIGH (ref 70–99)
Potassium: 4 mmol/L (ref 3.5–5.1)
Sodium: 136 mmol/L (ref 135–145)
Total Bilirubin: 0.6 mg/dL (ref 0.3–1.2)
Total Protein: 6.6 g/dL (ref 6.5–8.1)

## 2022-10-19 LAB — CBC WITH DIFFERENTIAL (CANCER CENTER ONLY)
Abs Immature Granulocytes: 0.08 10*3/uL — ABNORMAL HIGH (ref 0.00–0.07)
Basophils Absolute: 0.1 10*3/uL (ref 0.0–0.1)
Basophils Relative: 1 %
Eosinophils Absolute: 0 10*3/uL (ref 0.0–0.5)
Eosinophils Relative: 0 %
HCT: 34.3 % — ABNORMAL LOW (ref 39.0–52.0)
Hemoglobin: 12.2 g/dL — ABNORMAL LOW (ref 13.0–17.0)
Immature Granulocytes: 1 %
Lymphocytes Relative: 10 %
Lymphs Abs: 1 10*3/uL (ref 0.7–4.0)
MCH: 31.9 pg (ref 26.0–34.0)
MCHC: 35.6 g/dL (ref 30.0–36.0)
MCV: 89.8 fL (ref 80.0–100.0)
Monocytes Absolute: 0.9 10*3/uL (ref 0.1–1.0)
Monocytes Relative: 9 %
Neutro Abs: 8.2 10*3/uL — ABNORMAL HIGH (ref 1.7–7.7)
Neutrophils Relative %: 79 %
Platelet Count: 261 10*3/uL (ref 150–400)
RBC: 3.82 MIL/uL — ABNORMAL LOW (ref 4.22–5.81)
RDW: 15.3 % (ref 11.5–15.5)
WBC Count: 10.3 10*3/uL (ref 4.0–10.5)
nRBC: 0 % (ref 0.0–0.2)

## 2022-10-19 LAB — TSH: TSH: 1.101 u[IU]/mL (ref 0.350–4.500)

## 2022-10-19 MED ORDER — SODIUM CHLORIDE 0.9 % IV SOLN
446.5000 mg | Freq: Once | INTRAVENOUS | Status: AC
  Administered 2022-10-19: 450 mg via INTRAVENOUS
  Filled 2022-10-19: qty 45

## 2022-10-19 MED ORDER — SODIUM CHLORIDE 0.9% FLUSH
10.0000 mL | INTRAVENOUS | Status: DC | PRN
  Administered 2022-10-19: 10 mL

## 2022-10-19 MED ORDER — SODIUM CHLORIDE 0.9 % IV SOLN
100.0000 mg/m2 | Freq: Once | INTRAVENOUS | Status: AC
  Administered 2022-10-19: 200 mg via INTRAVENOUS
  Filled 2022-10-19: qty 10

## 2022-10-19 MED ORDER — SODIUM CHLORIDE 0.9 % IV SOLN: Freq: Once | INTRAVENOUS | Status: AC

## 2022-10-19 MED ORDER — SODIUM CHLORIDE 0.9% FLUSH
10.0000 mL | Freq: Once | INTRAVENOUS | Status: AC
  Administered 2022-10-19: 10 mL

## 2022-10-19 MED ORDER — PALONOSETRON HCL INJECTION 0.25 MG/5ML
0.2500 mg | Freq: Once | INTRAVENOUS | Status: AC
  Administered 2022-10-19: 0.25 mg via INTRAVENOUS
  Filled 2022-10-19: qty 5

## 2022-10-19 MED ORDER — HEPARIN SOD (PORK) LOCK FLUSH 100 UNIT/ML IV SOLN
500.0000 [IU] | Freq: Once | INTRAVENOUS | Status: AC | PRN
  Administered 2022-10-19: 500 [IU]

## 2022-10-19 MED ORDER — SODIUM CHLORIDE 0.9 % IV SOLN
150.0000 mg | Freq: Once | INTRAVENOUS | Status: AC
  Administered 2022-10-19: 150 mg via INTRAVENOUS
  Filled 2022-10-19: qty 150

## 2022-10-19 MED ORDER — SODIUM CHLORIDE 0.9 % IV SOLN
10.0000 mg | Freq: Once | INTRAVENOUS | Status: AC
  Administered 2022-10-19: 10 mg via INTRAVENOUS
  Filled 2022-10-19: qty 10

## 2022-10-19 MED FILL — Dexamethasone Sodium Phosphate Inj 100 MG/10ML: INTRAMUSCULAR | Qty: 1 | Status: AC

## 2022-10-19 NOTE — Patient Instructions (Signed)
Adam Jordan   Discharge Instructions: Thank you for choosing [facility] to provide your oncology and hematology care.   If you have a lab appointment with the [facility], please go directly to the [facility] and check in at the registration area.   Wear comfortable clothing and clothing appropriate for easy access to any Portacath or PICC line.   We strive to give you quality time with your provider. You may need to reschedule your appointment if you arrive late (15 or more minutes).  Arriving late affects you and other patients whose appointments are after yours.  Also, if you miss three or more appointments without notifying the office, you may be dismissed from the clinic at the provider's discretion.      For prescription refill requests, have your pharmacy contact our office and allow 72 hours for refills to be completed.    Today you received the following chemotherapy and/or immunotherapy agents: Etoposide and Carboplatin       To help prevent nausea and vomiting after your treatment, we encourage you to take your nausea medication as directed.  BELOW ARE SYMPTOMS THAT SHOULD BE REPORTED IMMEDIATELY: *FEVER GREATER THAN 100.4 F (38 C) OR HIGHER *CHILLS OR SWEATING *NAUSEA AND VOMITING THAT IS NOT CONTROLLED WITH YOUR NAUSEA MEDICATION *UNUSUAL SHORTNESS OF BREATH *UNUSUAL BRUISING OR BLEEDING *URINARY PROBLEMS (pain or burning when urinating, or frequent urination) *BOWEL PROBLEMS (unusual diarrhea, constipation, pain near the anus) TENDERNESS IN MOUTH AND THROAT WITH OR WITHOUT PRESENCE OF ULCERS (sore throat, sores in mouth, or a toothache) UNUSUAL RASH, SWELLING OR PAIN  UNUSUAL VAGINAL DISCHARGE OR ITCHING   Items with * indicate a potential emergency and should be followed up as soon as possible or go to the Emergency Department if any problems should occur.  Please show the CHEMOTHERAPY ALERT CARD or IMMUNOTHERAPY  ALERT CARD at check-in to the Emergency Department and triage nurse.  Should you have questions after your visit or need to cancel or reschedule your appointment, please contact [facility]  Dept: [phone]  and follow the prompts.  Office hours are 8:00 a.m. to 4:30 p.m. Monday - Friday. Please note that voicemails left after 4:00 p.m. may not be returned until the following business day.  We are closed weekends and major holidays. You have access to a nurse at all times for urgent questions. Please call the main number to the clinic Dept: [phone] and follow the prompts.   For any non-urgent questions, you may also contact your provider using MyChart. We now offer e-Visits for anyone 2 and older to request care online for non-urgent symptoms. For details visit [email or web address].   Also download the MyChart app! Go to the app store, search "MyChart", open the app, select , and log in with your MyChart username and password.

## 2022-10-19 NOTE — Progress Notes (Signed)
[place] [phone]   [phone]  OFFICE PROGRESS NOTE  Adam Morning, DO [place] [address]  DIAGNOSIS:  1) Extensive stage (T1c, N2, M1C) small cell lung cancer presented with right lower lobe lung nodule in addition to right hilar and mediastinal lymphadenopathy and bone metastasis involving thoracic spines as well as the right iliac crest diagnosed in July 2023. 2) immunotherapy mediated type 1 diabetes mellitus diagnosed in October 2023.  PRIOR THERAPY:  1) Systemic chemotherapy with carboplatin for AUC of 5 on day 1, etoposide 100 Mg/M2 on days 1, 2 and 3 with Imfinzi 1500 Mg IV on day 1 and Cosela 240 Mg/M2 on the days of the chemotherapy as well as Imfinzi 1500 Mg IV on day 1 every 3 weeks the first 4 cycles followed by maintenance treatment every 4 weeks starting from cycle #5.  First dose was giving 03/10/2022.  The patient status post 8 cycles.  Last dose was given on August 25, 2022.  Starting from cycle #5 the patient will be on maintenance treatment with Imfinzi 1500 Mg IV every 4 weeks.  This treatment was discontinued secondary to disease progression. 2) Palliative radiotherapy to the enlarging mediastinal lymphadenopathy under the care of Dr. Isidore Moos.  CURRENT THERAPY: Systemic chemotherapy again with carboplatin for AUC of 5 on day 1 and etoposide 100 Mg/M2 on days 1, 2 and 3 with Neulasta support.  First dose September 28, 2022.  Status post 1 cycle.  INTERVAL HISTORY: Adam Jordan 77 y.o. male returns to the clinic today for follow-up visit accompanied by his wife.  The patient is feeling fine today with no concerning complaints except for fatigue.  He tolerated the first cycle of his treatment fairly well except for aching pain and fatigue for several days after the Neulasta injection.  He denied having any current chest pain, shortness of breath, cough or hemoptysis.  He has no nausea, vomiting,  diarrhea or constipation.  He has no headache or visual changes.  He denied having any recent weight loss or night sweats.  He is here today for evaluation before starting cycle #2 of his treatment.   MEDICAL HISTORY: Past Medical History:  Diagnosis Date   Anxiety    Arthritis    osteoarthrititis- knees and most joints.   COPD (chronic obstructive pulmonary disease) (HCC)    moderate -no regular use of inhalers- rare use of oxygen as sexual activity   Dyspnea    outside in hot weather and also with pollen   Elevated blood-pressure reading, without diagnosis of hypertension 07/31/2019   Encounter for antineoplastic chemotherapy 03/04/2022   Encounter for antineoplastic immunotherapy 03/04/2022   History of heart artery stent 11/04/2021   Hypercholesterolemia 11/05/2013   Hyperlipidemia    Hypertension    Malaise and fatigue 04/19/2013   Neuromuscular disorder ([place])    feet   Non-recurrent unilateral inguinal hernia without obstruction or gangrene 07/21/2021   Paroxysmal atrial flutter ([place]) 01/15/2021   Port-A-Cath in place 03/31/2022   Routine history and physical examination of adult 11/05/2013   Shoulder joint pain 08/23/2013   Status post lumbar microdiscectomy 07/31/2019   Thoracoabdominal aneurysm [facility])    s/p FEVAR 4 Vessel TABME 02/19/20 Dr. Katy Apo    ALLERGIES:  is allergic to losartan, atorvastatin calcium, and pollen extract-tree extract [pollen extract].  MEDICATIONS:  Current Outpatient Medications  Medication Sig Dispense Refill   Alum Hydroxide-Mag Trisilicate (GAVISCON) [id number] MG CHEW  Chew 1 tablet by mouth daily as needed (heartburn).     apixaban (ELIQUIS) 5 MG TABS tablet Take 1 tablet (5 mg total) by mouth 2 (two) times daily. 180 tablet 0   atorvastatin (LIPITOR) 20 MG tablet Take 20 mg by mouth at bedtime.     blood glucose meter kit and supplies Dispense based on patient and insurance preference. Use up to four times daily as directed. (FOR ICD-10 E10.9, E11.9).  (Patient taking differently: 1 each by Other route See admin instructions. Dispense based on patient and insurance preference. Use up to four times daily as directed. (FOR ICD-10 E10.9, E11.9).) 1 each 0   clonazePAM (KLONOPIN) 1 MG tablet Take 1 tablet (1 mg total) by mouth 2 (two) times daily as needed for anxiety. 60 tablet 5   Continuous Blood Gluc Sensor (DEXCOM G7 SENSOR) MISC 1 Device by Does not apply route as directed. 9 each 3   fluticasone (FLONASE) 50 MCG/ACT nasal spray Place 1 spray into both nostrils 2 (two) times daily as needed for allergies.     insulin aspart (NOVOLOG FLEXPEN) 100 UNIT/ML FlexPen Max daily 30 units (Patient taking differently: Inject 1 Units into the skin 3 (three) times daily with meals. Max daily 30 units) 15 mL 11   insulin glargine (LANTUS) 100 UNIT/ML Solostar Pen Inject 24 Units into the skin daily. 30 mL 3   Insulin Pen Needle 32G X 4 MM MISC 1 Device by Does not apply route in the Jordan, at noon, in the evening, and at bedtime. 400 each 3   Lancets (ONETOUCH DELICA PLUS [id number]) MISC Apply 1 each topically 4 (four) times daily.     lidocaine (XYLOCAINE) 2 % solution Patient: Mix 1part 2% viscous lidocaine, 1part H20. Swallow 29mL of diluted mixture, 27min before meals and at bedtime, up to QID (Patient taking differently: Use as directed 15 mLs in the mouth or throat See admin instructions. Patient: Mix 1part 2% viscous lidocaine, 1part H20. Swallow 35mL of diluted mixture, 77min before meals and at bedtime, up to QID) 100 mL 3   lidocaine-prilocaine (EMLA) cream Apply to the Port-A-Cath site 30-60 minutes before chemotherapy (Patient taking differently: Apply 1 Application topically See admin instructions. Apply to the Port-A-Cath site 30-60 minutes before chemotherapy) 30 g 0   meloxicam (MOBIC) 15 MG tablet Take 15 mg by mouth daily as needed for pain.     Multiple Vitamin (MULTIVITAMIN WITH MINERALS) TABS tablet Take 1 tablet by mouth daily. 30 tablet 5    Naphazoline HCl (CLEAR EYES OP) Place 1 drop into both eyes daily.     nitroGLYCERIN (NITROSTAT) 0.4 MG SL tablet Place 1 tablet (0.4 mg total) under the tongue every 5 (five) minutes as needed for chest pain. 25 tablet 6   ONETOUCH ULTRA test strip 1 each by Other route 3 (three) times daily. for testing 300 each 3   oxyCODONE-acetaminophen (PERCOCET/ROXICET) 5-325 MG tablet Take 1 tablet by mouth every 8 (eight) hours as needed for severe pain. 30 tablet 0   PROAIR HFA 108 (90 BASE) MCG/ACT inhaler Inhale 2 puffs into the lungs every 6 (six) hours as needed for wheezing or shortness of breath.   2   sildenafil (REVATIO) 20 MG tablet Take 60 mg by mouth daily as needed (ED).     Tiotropium Bromide-Olodaterol (STIOLTO RESPIMAT) 2.5-2.5 MCG/ACT AERS Inhale 2 puffs into the lungs daily. 4 g 0   traZODone (DESYREL) 50 MG tablet Take 50 mg by mouth at bedtime.  triamcinolone cream (KENALOG) 0.1 % Apply 1 Application topically daily.     No current facility-administered medications for this visit.    SURGICAL HISTORY:  Past Surgical History:  Procedure Laterality Date   ABDOMINAL AORTIC ANEURYSM REPAIR  02/06/2020   BRONCHIAL BIOPSY  02/23/2022   Procedure: BRONCHIAL BIOPSIES;  Surgeon: Collene Gobble, MD;  Location: [facility] ENDOSCOPY;  Service: Pulmonary;;   BRONCHIAL BRUSHINGS  02/23/2022   Procedure: BRONCHIAL BRUSHINGS;  Surgeon: Collene Gobble, MD;  Location[facility] ENDOSCOPY;  Service: Pulmonary;;   BRONCHIAL NEEDLE ASPIRATION BIOPSY  02/23/2022   Procedure: BRONCHIAL NEEDLE ASPIRATION BIOPSIES;  Surgeon: Collene Gobble, MD;  Location: [place] ENDOSCOPY;  Service: Pulmonary;;   BROW LIFT Bilateral 12/11/2021   Procedure: UPPER LID BLEPHAROPLASTY;  Surgeon: Irene Limbo, MD;  Location: [place];  Service: Plastics;  Laterality: Bilateral;   CYST REMOVAL LEG Left 07/03/2021   Procedure: EXCISION CYST LEFT BUTTOCK;  Surgeon: Jovita Kussmaul, MD;  Location: [place];  Service: General;   Laterality: Left;   EYE SURGERY Bilateral    cataract surgery   FINGER ARTHROPLASTY Left    left thumb-Dr. Amedeo Plenty   INGUINAL HERNIA REPAIR Left 07/03/2021   Procedure: LEFT INGUINAL HERNIA REPAIR WITH MESH;  Surgeon: Jovita Kussmaul, MD;  Location: [place];  Service: General;  Laterality: Left;   INSERTION OF MESH N/A 07/03/2021   Procedure: INSERTION OF MESH X2;  Surgeon: Jovita Kussmaul, MD;  Location: [place];  Service: General;  Laterality: N/A;   IR IMAGING GUIDED PORT INSERTION  03/12/2022   IR RADIOLOGIST EVAL & MGMT  10/25/2019   KNEE ARTHROSCOPY Left    KNEE SURGERY Left    Bakers cyst x2   SHOULDER ARTHROSCOPY Right    thumb surgery     TONSILLECTOMY     TOTAL KNEE ARTHROPLASTY Left 03/19/2016   Procedure: LEFT TOTAL KNEE ARTHROPLASTY;  Surgeon: Sydnee Cabal, MD;  Location: [facility];  Service: Orthopedics;  Laterality: Left;   TOTAL SHOULDER REPLACEMENT Right    UMBILICAL HERNIA REPAIR N/A 07/03/2021   Procedure: UMBILICAL HERNIA REPAIR WITH MESH;  Surgeon: Jovita Kussmaul, MD;  Location: [place];  Service: General;  Laterality: N/A;   VASCULAR SURGERY     VASECTOMY     VIDEO BRONCHOSCOPY WITH ENDOBRONCHIAL ULTRASOUND Right 02/23/2022   Procedure: VIDEO BRONCHOSCOPY WITH ENDOBRONCHIAL ULTRASOUND;  Surgeon: Collene Gobble, MD;  Location: [facility] ENDOSCOPY;  Service: Pulmonary;  Laterality: Right;   VIDEO BRONCHOSCOPY WITH RADIAL ENDOBRONCHIAL ULTRASOUND  02/23/2022   Procedure: VIDEO BRONCHOSCOPY WITH RADIAL ENDOBRONCHIAL ULTRASOUND;  Surgeon: Collene Gobble, MD;  Location: [facility] ENDOSCOPY;  Service: Pulmonary;;    REVIEW OF SYSTEMS:  A comprehensive review of systems was negative except for: Constitutional: positive for fatigue   PHYSICAL EXAMINATION: General appearance: alert, cooperative, fatigued, and no distress Head: Normocephalic, without obvious abnormality, atraumatic Neck: no adenopathy, no JVD, supple, symmetrical, trachea midline, and thyroid not enlarged, symmetric, no  tenderness/mass/nodules Lymph nodes: Cervical, supraclavicular, and axillary nodes normal. Resp: clear to auscultation bilaterally Back: symmetric, no curvature. ROM normal. No CVA tenderness. Cardio: regular rate and rhythm, S1, S2 normal, no murmur, click, rub or gallop GI: soft, non-tender; bowel sounds normal; no masses,  no organomegaly Extremities: extremities normal, atraumatic, no cyanosis or edema  ECOG PERFORMANCE STATUS: 1 - Symptomatic but completely ambulatory  Blood pressure (!) 140/50, pulse 73, temperature 97.8 F (36.6 C), temperature source Oral, resp. rate 16, weight 157 lb (71.2 kg), SpO2  98 %.  LABORATORY DATA: Lab Results  Component Value Date   WBC 7.1 09/22/2022   HGB 13.3 09/22/2022   HCT 39.3 09/22/2022   MCV 90.6 09/22/2022   PLT 162 09/22/2022      Chemistry      Component Value Date/Time   NA 135 08/25/2022 0818   NA 138 02/03/2021 0944   K 4.1 08/25/2022 0818   CL 102 08/25/2022 0818   CO2 29 08/25/2022 0818   BUN 20 08/25/2022 0818   BUN 13 02/03/2021 0944   CREATININE 0.97 08/25/2022 0818      Component Value Date/Time   CALCIUM 9.9 08/25/2022 0818   ALKPHOS 57 08/25/2022 0818   AST 17 08/25/2022 0818   ALT 15 08/25/2022 0818   BILITOT 0.5 08/25/2022 0818       RADIOGRAPHIC STUDIES: CT Chest W Contrast  Result Date: 09/21/2022 CLINICAL DATA:  Staging small-cell lung cancer. Completed chemotherapy in October 2023. Finished radiation 2 weeks ago * Tracking Code: BO * EXAM: CT CHEST, ABDOMEN, AND PELVIS WITH CONTRAST TECHNIQUE: Multidetector CT imaging of the chest, abdomen and pelvis was performed following the standard protocol during bolus administration of intravenous contrast. RADIATION DOSE REDUCTION: This exam was performed according to the departmental dose-optimization program which includes automated exposure control, adjustment of the mA and/or kV according to patient size and/or use of iterative reconstruction technique.  CONTRAST:  163mL OMNIPAQUE IOHEXOL 300 MG/ML  SOLN COMPARISON:  07/22/2022 FINDINGS: CT CHEST FINDINGS Cardiovascular: Right upper chest port. Heart is nonenlarged. No pericardial effusion. Coronary artery calcifications are seen. Normal caliber thoracic aorta with scattered vascular calcifications. Slight ectasia of the aortic arch with diameter approaching up to 3.2 cm on series 2, image 25. There is a vascular stent in place along the lower descending thoracic aorta extending into the abdomen. Mediastinum/Nodes: No specific abnormal lymph node enlargement identified in the axillary regions. None along the left hilum. Previously there were right hilar nodes identified which have significantly improved. Mediastinal nodes have also improved. Specific lesions will be followed for continuity. The previous pretracheal node is measured 2.3 x 2.1 cm and today on image 31 of series 2 measures 12 by 8 mm. Subcarinal lesion which measured 2.9 x 2.4 cm, today on series 2, image 36 measures 2.4 by 0.8 cm. No new lymph node enlargement identified. Lungs/Pleura: Centrilobular and paraseptal emphysematous changes are identified. No consolidation, pneumothorax or effusion. Mild dependent atelectasis noted bilaterally. The ill-defined nodule in the right lower lobe which previously measured 18 x 8 mm, today when measured in the same fashion on series 4, image 114 measures 16 by 8 mm. The smaller area just superior to this which previously measured 8 x 7 mm, today on image 104 of series 4 measures 7 by 7 mm. There is a small nodule just medial to this which is slightly larger on series 4, image 105 measuring 5 x 3 mm. Previously 4 x 2 mm. Few other smaller areas of nodularity in the right lower lobe are again seen such as image 126. There is lingular and middle lobe scarring atelectatic changes. Areas of bronchiectasis. Musculoskeletal: Slight curvature of the spine. Scattered degenerative changes. Streak artifact related to the  patient's right shoulder arthroplasty. CT ABDOMEN PELVIS FINDINGS Hepatobiliary: Fatty liver infiltration identified. There is a new liver lesion identified inferiorly along the margin of segment 4B and 5 on series 2, image 77 measuring 18 by 11 mm. There is also a new tiny focus seen in segment  4A on image 63 of series 2 measuring 7 mm. Few other small foci elsewhere on image 65, 68 in the right hepatic lobe. These are worrisome for developing liver metastases. Patent portal vein. Gallbladder is nondilated. Pancreas: Unremarkable. No pancreatic ductal dilatation or surrounding inflammatory changes. Spleen: Normal in size without focal abnormality. Adrenals/Urinary Tract: Right adrenal gland is preserved. The left is nodular and thickened, increased from previous. Example on image 68 of series 2 measures 16 by 13 mm. Previously this area would have measured 14 by 11 mm. No enhancing renal mass or collecting system dilatation. Ureters have normal course and caliber extending down to the bladder. Preserved contours of the urinary bladder. Stomach/Bowel: Moderate scattered colonic stool. Large bowel is nondilated. There is atypical distribution of bowel with colon and some small bowel extending between the liver in the anterior abdominal wall margin. Sigmoid colon diverticulosis. Stomach is nondilated. Normal appendix. Of note the cecum is in the upper anterior right hemiabdomen between the liver in the anterior abdominal wall itself. Atypical distribution of bowel. Vascular/Lymphatic: Once again there is a aortic endograft in place with extension up into the distal descending thoracic aorta. Multiple stents as well extending along the SMA, celiac and bilateral renal arteries. No obvious endoleak or graft failure but the study is not performed as a dedicated endograft protocol. No specific abnormal lymph node enlargement seen in the abdomen and pelvis. Reproductive: Prostate is unremarkable. Other: Mild anasarca.  No  definite ascites. Musculoskeletal: Curvature and degenerative changes along the spine. IMPRESSION: 1. Interval improvement in the mediastinal and right hilar lymphadenopathy. 2. Multiple small right lower lobe lung nodules. These are relatively similar. Only 1 tiny nodule is clearly larger today. 3. Developing new liver lesions worrisome for liver metastases. 4. Developing left adrenal mass worrisome for metastasis. 5. Emphysema. 6. Aortic endograft in place. No obvious endoleak or graft failure but the study is not performed as a dedicated endograft protocol. Aortic Atherosclerosis (ICD10-I70.0) and Emphysema (ICD10-J43.9). Identified consistent with Results will be called to the ordering service by the Radiology physician assistant team Electronically Signed   By: Jill Side M.D.   On: 09/21/2022 11:55   CT Abdomen Pelvis W Contrast  Result Date: 09/21/2022 CLINICAL DATA:  Staging small-cell lung cancer. Completed chemotherapy in October 2023. Finished radiation 2 weeks ago * Tracking Code: BO * EXAM: CT CHEST, ABDOMEN, AND PELVIS WITH CONTRAST TECHNIQUE: Multidetector CT imaging of the chest, abdomen and pelvis was performed following the standard protocol during bolus administration of intravenous contrast. RADIATION DOSE REDUCTION: This exam was performed according to the departmental dose-optimization program which includes automated exposure control, adjustment of the mA and/or kV according to patient size and/or use of iterative reconstruction technique. CONTRAST:  138mL OMNIPAQUE IOHEXOL 300 MG/ML  SOLN COMPARISON:  07/22/2022 FINDINGS: CT CHEST FINDINGS Cardiovascular: Right upper chest port. Heart is nonenlarged. No pericardial effusion. Coronary artery calcifications are seen. Normal caliber thoracic aorta with scattered vascular calcifications. Slight ectasia of the aortic arch with diameter approaching up to 3.2 cm on series 2, image 25. There is a vascular stent in place along the lower  descending thoracic aorta extending into the abdomen. Mediastinum/Nodes: No specific abnormal lymph node enlargement identified in the axillary regions. None along the left hilum. Previously there were right hilar nodes identified which have significantly improved. Mediastinal nodes have also improved. Specific lesions will be followed for continuity. The previous pretracheal node is measured 2.3 x 2.1 cm and today on image 31 of series  2 measures 12 by 8 mm. Subcarinal lesion which measured 2.9 x 2.4 cm, today on series 2, image 36 measures 2.4 by 0.8 cm. No new lymph node enlargement identified. Lungs/Pleura: Centrilobular and paraseptal emphysematous changes are identified. No consolidation, pneumothorax or effusion. Mild dependent atelectasis noted bilaterally. The ill-defined nodule in the right lower lobe which previously measured 18 x 8 mm, today when measured in the same fashion on series 4, image 114 measures 16 by 8 mm. The smaller area just superior to this which previously measured 8 x 7 mm, today on image 104 of series 4 measures 7 by 7 mm. There is a small nodule just medial to this which is slightly larger on series 4, image 105 measuring 5 x 3 mm. Previously 4 x 2 mm. Few other smaller areas of nodularity in the right lower lobe are again seen such as image 126. There is lingular and middle lobe scarring atelectatic changes. Areas of bronchiectasis. Musculoskeletal: Slight curvature of the spine. Scattered degenerative changes. Streak artifact related to the patient's right shoulder arthroplasty. CT ABDOMEN PELVIS FINDINGS Hepatobiliary: Fatty liver infiltration identified. There is a new liver lesion identified inferiorly along the margin of segment 4B and 5 on series 2, image 77 measuring 18 by 11 mm. There is also a new tiny focus seen in segment 4A on image 63 of series 2 measuring 7 mm. Few other small foci elsewhere on image 65, 68 in the right hepatic lobe. These are worrisome for developing  liver metastases. Patent portal vein. Gallbladder is nondilated. Pancreas: Unremarkable. No pancreatic ductal dilatation or surrounding inflammatory changes. Spleen: Normal in size without focal abnormality. Adrenals/Urinary Tract: Right adrenal gland is preserved. The left is nodular and thickened, increased from previous. Example on image 68 of series 2 measures 16 by 13 mm. Previously this area would have measured 14 by 11 mm. No enhancing renal mass or collecting system dilatation. Ureters have normal course and caliber extending down to the bladder. Preserved contours of the urinary bladder. Stomach/Bowel: Moderate scattered colonic stool. Large bowel is nondilated. There is atypical distribution of bowel with colon and some small bowel extending between the liver in the anterior abdominal wall margin. Sigmoid colon diverticulosis. Stomach is nondilated. Normal appendix. Of note the cecum is in the upper anterior right hemiabdomen between the liver in the anterior abdominal wall itself. Atypical distribution of bowel. Vascular/Lymphatic: Once again there is a aortic endograft in place with extension up into the distal descending thoracic aorta. Multiple stents as well extending along the SMA, celiac and bilateral renal arteries. No obvious endoleak or graft failure but the study is not performed as a dedicated endograft protocol. No specific abnormal lymph node enlargement seen in the abdomen and pelvis. Reproductive: Prostate is unremarkable. Other: Mild anasarca.  No definite ascites. Musculoskeletal: Curvature and degenerative changes along the spine. IMPRESSION: 1. Interval improvement in the mediastinal and right hilar lymphadenopathy. 2. Multiple small right lower lobe lung nodules. These are relatively similar. Only 1 tiny nodule is clearly larger today. 3. Developing new liver lesions worrisome for liver metastases. 4. Developing left adrenal mass worrisome for metastasis. 5. Emphysema. 6. Aortic  endograft in place. No obvious endoleak or graft failure but the study is not performed as a dedicated endograft protocol. Aortic Atherosclerosis (ICD10-I70.0) and Emphysema (ICD10-J43.9). Identified consistent with Results will be called to the ordering service by the Radiology physician assistant team Electronically Signed   By: Jill Side M.D.   On: 09/21/2022 11:55  ASSESSMENT AND PLAN: This is a very pleasant 77 years old white male with extensive stage (T1c, N2, M1C) small cell lung cancer presented with right lower lobe lung nodule in addition to right hilar and mediastinal lymphadenopathy and bone metastasis involving thoracic spines as well as the right iliac crest diagnosed in July 2023. The patient started systemic chemotherapy with carboplatin for AUC of 5 on day 1, etoposide 100 Mg/M2 on days 1, 2 and 3 as well as Cosela 240 Mg/M2 on the days of the chemotherapy and Imfinzi 1500 Mg IV every 3 weeks with the induction treatment.  He is status post 8 cycles.  Starting from cycle #5 the patient will be on maintenance treatment with Imfinzi 1500 Mg IV every 4 weeks.  Last dose was given on August 25, 2022 discontinued secondary to disease progression. He is currently undergoing palliative radiotherapy to the enlarging mediastinal lymphadenopathy under the care of Dr. Isidore Moos. The patient tolerated his previous course of systemic chemotherapy as well as the palliative radiation fairly well except for the aching pain in the sternal area after the radiation. He had repeat CT scan of the chest, abdomen and pelvis performed recently.  I personally and independently reviewed the scan images and discussed the results with the patient today. His scan showed interval improvement in the mediastinal and right hilar adenopathy but unfortunately he has developed a new liver lesion worrisome for liver metastasis as well as developing left adrenal mass again worrisome for metastasis. He started systemic  chemotherapy again with carboplatin for AUC of 5 on day 1 and etoposide 100 Mg/M2 on days 1, 2 and 3 with Neulasta support every 3 weeks for 4 cycles.  He is status post 1 cycle. The patient has been tolerating this treatment well with no concerning adverse effects except for the fatigue and aching pain after the Neulasta injection. I recommended for him to proceed with cycle #2 today as planned. He was advised to take Claritin and Tylenol after the Neulasta injection. For the immunotherapy mediated type 1 diabetes mellitus, he will continue his evaluation and treatment under Dr. Kelton Pillar. I will see him back for follow-up visit in 3 weeks for evaluation with repeat CT scan of the chest, abdomen and pelvis for restaging of his disease. The patient was advised to call immediately if he has any concerning symptoms in the interval. The patient voices understanding of current disease status and treatment options and is in agreement with the current care plan.  All questions were answered. The patient knows to call the clinic with any problems, questions or concerns. We can certainly see the patient much sooner if necessary.  The total time spent in the appointment was 20 minutes.  Disclaimer: This note was dictated with voice recognition software. Similar sounding words can inadvertently be transcribed and may not be corrected upon review.

## 2022-10-19 NOTE — Progress Notes (Signed)
Patient seen by MD today  Vitals are within treatment parameters.  Labs reviewed: and are within treatment parameters.  Per physician team, patient is ready for treatment and there are NO modifications to the treatment plan.  

## 2022-10-20 ENCOUNTER — Other Ambulatory Visit: Payer: Medicare HMO

## 2022-10-20 ENCOUNTER — Ambulatory Visit: Payer: Medicare HMO

## 2022-10-20 ENCOUNTER — Ambulatory Visit: Payer: Medicare HMO | Admitting: Physician Assistant

## 2022-10-20 ENCOUNTER — Inpatient Hospital Stay: Payer: Medicare HMO

## 2022-10-20 VITALS — BP 128/63 | HR 71 | Temp 98.1°F | Resp 19

## 2022-10-20 DIAGNOSIS — C3431 Malignant neoplasm of lower lobe, right bronchus or lung: Secondary | ICD-10-CM

## 2022-10-20 DIAGNOSIS — Z5111 Encounter for antineoplastic chemotherapy: Secondary | ICD-10-CM | POA: Diagnosis not present

## 2022-10-20 DIAGNOSIS — Z5189 Encounter for other specified aftercare: Secondary | ICD-10-CM | POA: Diagnosis not present

## 2022-10-20 DIAGNOSIS — Z79620 Long term (current) use of immunosuppressive biologic: Secondary | ICD-10-CM | POA: Diagnosis not present

## 2022-10-20 DIAGNOSIS — C7951 Secondary malignant neoplasm of bone: Secondary | ICD-10-CM | POA: Diagnosis not present

## 2022-10-20 MED ORDER — SODIUM CHLORIDE 0.9 % IV SOLN
100.0000 mg/m2 | Freq: Once | INTRAVENOUS | Status: AC
  Administered 2022-10-20: 200 mg via INTRAVENOUS
  Filled 2022-10-20: qty 10

## 2022-10-20 MED ORDER — SODIUM CHLORIDE 0.9 % IV SOLN: Freq: Once | INTRAVENOUS | Status: AC

## 2022-10-20 MED ORDER — HEPARIN SOD (PORK) LOCK FLUSH 100 UNIT/ML IV SOLN
500.0000 [IU] | Freq: Once | INTRAVENOUS | Status: AC | PRN
  Administered 2022-10-20: 500 [IU]

## 2022-10-20 MED ORDER — SODIUM CHLORIDE 0.9 % IV SOLN
10.0000 mg | Freq: Once | INTRAVENOUS | Status: AC
  Administered 2022-10-20: 10 mg via INTRAVENOUS
  Filled 2022-10-20: qty 10

## 2022-10-20 MED ORDER — SODIUM CHLORIDE 0.9% FLUSH
10.0000 mL | INTRAVENOUS | Status: DC | PRN
  Administered 2022-10-20: 10 mL

## 2022-10-20 MED FILL — Dexamethasone Sodium Phosphate Inj 100 MG/10ML: INTRAMUSCULAR | Qty: 1 | Status: AC

## 2022-10-20 NOTE — Patient Instructions (Signed)
[facility]  Discharge Instructions: Thank you for choosing [facility] to provide your oncology and hematology care.   If you have a lab appointment with the Cancer Center, please go directly to the Cancer Center and check in at the registration area.   Wear comfortable clothing and clothing appropriate for easy access to any Portacath or PICC line.   We strive to give you quality time with your provider. You may need to reschedule your appointment if you arrive late (15 or more minutes).  Arriving late affects you and other patients whose appointments are after yours.  Also, if you miss three or more appointments without notifying the office, you may be dismissed from the clinic at the provider's discretion.      For prescription refill requests, have your pharmacy contact our office and allow 72 hours for refills to be completed.    Today you received the following chemotherapy and/or immunotherapy agents; Etoposide      To help prevent nausea and vomiting after your treatment, we encourage you to take your nausea medication as directed.  BELOW ARE SYMPTOMS THAT SHOULD BE REPORTED IMMEDIATELY: *FEVER GREATER THAN 100.4 F (38 C) OR HIGHER *CHILLS OR SWEATING *NAUSEA AND VOMITING THAT IS NOT CONTROLLED WITH YOUR NAUSEA MEDICATION *UNUSUAL SHORTNESS OF BREATH *UNUSUAL BRUISING OR BLEEDING *URINARY PROBLEMS (pain or burning when urinating, or frequent urination) *BOWEL PROBLEMS (unusual diarrhea, constipation, pain near the anus) TENDERNESS IN MOUTH AND THROAT WITH OR WITHOUT PRESENCE OF ULCERS (sore throat, sores in mouth, or a toothache) UNUSUAL RASH, SWELLING OR PAIN  UNUSUAL VAGINAL DISCHARGE OR ITCHING   Items with * indicate a potential emergency and should be followed up as soon as possible or go to the Emergency Department if any problems should occur.  Please show the CHEMOTHERAPY ALERT CARD or IMMUNOTHERAPY ALERT CARD at  check-in to the Emergency Department and triage nurse.  Should you have questions after your visit or need to cancel or reschedule your appointment, please contact [facility]  Dept: [phone]  and follow the prompts.  Office hours are 8:00 a.m. to 4:30 p.m. Monday - Friday. Please note that voicemails left after 4:00 p.m. may not be returned until the following business day.  We are closed weekends and major holidays. You have access to a nurse at all times for urgent questions. Please call the main number to the clinic Dept: [phone] and follow the prompts.   For any non-urgent questions, you may also contact your provider using MyChart. We now offer e-Visits for anyone 18 and older to request care online for non-urgent symptoms. For details visit [email or web address].   Also download the MyChart app! Go to the app store, search "MyChart", open the app, select [facility], and log in with your MyChart username and password.   

## 2022-10-21 ENCOUNTER — Other Ambulatory Visit: Payer: Self-pay | Admitting: Internal Medicine

## 2022-10-21 ENCOUNTER — Inpatient Hospital Stay: Payer: Medicare HMO

## 2022-10-21 VITALS — BP 151/70 | HR 70 | Temp 98.0°F | Resp 16

## 2022-10-21 DIAGNOSIS — C3431 Malignant neoplasm of lower lobe, right bronchus or lung: Secondary | ICD-10-CM | POA: Diagnosis not present

## 2022-10-21 DIAGNOSIS — Z79620 Long term (current) use of immunosuppressive biologic: Secondary | ICD-10-CM | POA: Diagnosis not present

## 2022-10-21 DIAGNOSIS — C7951 Secondary malignant neoplasm of bone: Secondary | ICD-10-CM | POA: Diagnosis not present

## 2022-10-21 DIAGNOSIS — Z5189 Encounter for other specified aftercare: Secondary | ICD-10-CM | POA: Diagnosis not present

## 2022-10-21 DIAGNOSIS — Z5111 Encounter for antineoplastic chemotherapy: Secondary | ICD-10-CM | POA: Diagnosis not present

## 2022-10-21 MED ORDER — SODIUM CHLORIDE 0.9 % IV SOLN
100.0000 mg/m2 | Freq: Once | INTRAVENOUS | Status: AC
  Administered 2022-10-21: 200 mg via INTRAVENOUS
  Filled 2022-10-21: qty 10

## 2022-10-21 MED ORDER — HEPARIN SOD (PORK) LOCK FLUSH 100 UNIT/ML IV SOLN
500.0000 [IU] | Freq: Once | INTRAVENOUS | Status: AC | PRN
  Administered 2022-10-21: 500 [IU]

## 2022-10-21 MED ORDER — SODIUM CHLORIDE 0.9% FLUSH
10.0000 mL | INTRAVENOUS | Status: DC | PRN
  Administered 2022-10-21: 10 mL

## 2022-10-21 MED ORDER — SODIUM CHLORIDE 0.9 % IV SOLN: Freq: Once | INTRAVENOUS | Status: AC

## 2022-10-21 MED ORDER — OXYCODONE-ACETAMINOPHEN 5-325 MG PO TABS: 1.0000 | ORAL_TABLET | Freq: Three times a day (TID) | ORAL | 0 refills | Status: DC | PRN

## 2022-10-21 MED ORDER — SODIUM CHLORIDE 0.9 % IV SOLN
10.0000 mg | Freq: Once | INTRAVENOUS | Status: AC
  Administered 2022-10-21: 10 mg via INTRAVENOUS
  Filled 2022-10-21: qty 10

## 2022-10-22 ENCOUNTER — Other Ambulatory Visit: Payer: Self-pay | Admitting: Cardiology

## 2022-10-22 DIAGNOSIS — I4892 Unspecified atrial flutter: Secondary | ICD-10-CM

## 2022-10-22 NOTE — Telephone Encounter (Signed)
Prescription refill request for Eliquis received. Indication: afib  Last office visit: Adam Jordan, 09/07/2022 Scr: 0.80, 10/19/2022 Age: 77 yo  Weight: 69.9 kg   Refill sent.

## 2022-10-23 ENCOUNTER — Inpatient Hospital Stay: Payer: Medicare HMO

## 2022-10-23 VITALS — BP 120/58 | HR 70 | Temp 97.8°F | Resp 16

## 2022-10-23 DIAGNOSIS — C7951 Secondary malignant neoplasm of bone: Secondary | ICD-10-CM | POA: Diagnosis not present

## 2022-10-23 DIAGNOSIS — Z5111 Encounter for antineoplastic chemotherapy: Secondary | ICD-10-CM | POA: Diagnosis not present

## 2022-10-23 DIAGNOSIS — C3431 Malignant neoplasm of lower lobe, right bronchus or lung: Secondary | ICD-10-CM | POA: Diagnosis not present

## 2022-10-23 DIAGNOSIS — Z5189 Encounter for other specified aftercare: Secondary | ICD-10-CM | POA: Diagnosis not present

## 2022-10-23 DIAGNOSIS — Z79620 Long term (current) use of immunosuppressive biologic: Secondary | ICD-10-CM | POA: Diagnosis not present

## 2022-10-23 MED ORDER — PEGFILGRASTIM-JMDB 6 MG/0.6ML ~~LOC~~ SOSY
6.0000 mg | PREFILLED_SYRINGE | Freq: Once | SUBCUTANEOUS | Status: AC
  Administered 2022-10-23: 6 mg via SUBCUTANEOUS

## 2022-10-23 NOTE — Patient Instructions (Signed)

## 2022-10-26 ENCOUNTER — Inpatient Hospital Stay: Payer: Medicare HMO

## 2022-10-27 ENCOUNTER — Telehealth: Payer: Self-pay

## 2022-10-27 ENCOUNTER — Telehealth: Payer: Self-pay | Admitting: Medical Oncology

## 2022-10-27 ENCOUNTER — Telehealth: Payer: Self-pay | Admitting: Internal Medicine

## 2022-10-27 DIAGNOSIS — M5416 Radiculopathy, lumbar region: Secondary | ICD-10-CM | POA: Diagnosis not present

## 2022-10-27 NOTE — Telephone Encounter (Signed)
Clinical notes sent through Epic to [place] [facility] per fax request to [phone]

## 2022-10-27 NOTE — Telephone Encounter (Signed)
LVM to return call.

## 2022-10-27 NOTE — Telephone Encounter (Signed)
Called regarding 03/26 scheduled message, patient has been called multiple times and voicemail was left.

## 2022-10-28 ENCOUNTER — Telehealth: Payer: Self-pay | Admitting: Internal Medicine

## 2022-10-28 NOTE — Telephone Encounter (Signed)
Called to schedule lab appointment per 03/27 scheduled message, left a voicemail.

## 2022-10-29 ENCOUNTER — Ambulatory Visit (HOSPITAL_COMMUNITY)
Admission: RE | Admit: 2022-10-29 | Discharge: 2022-10-29 | Disposition: A | Discharge status: Home / Self Care | Payer: Medicare HMO | Source: Ambulatory Visit | Attending: Internal Medicine | Admitting: Internal Medicine

## 2022-10-29 ENCOUNTER — Inpatient Hospital Stay: Payer: Medicare HMO

## 2022-10-29 DIAGNOSIS — C3431 Malignant neoplasm of lower lobe, right bronchus or lung: Secondary | ICD-10-CM

## 2022-10-29 DIAGNOSIS — K7689 Other specified diseases of liver: Secondary | ICD-10-CM | POA: Diagnosis not present

## 2022-10-29 DIAGNOSIS — Z95828 Presence of other vascular implants and grafts: Secondary | ICD-10-CM

## 2022-10-29 DIAGNOSIS — C3490 Malignant neoplasm of unspecified part of unspecified bronchus or lung: Secondary | ICD-10-CM | POA: Insufficient documentation

## 2022-10-29 DIAGNOSIS — R918 Other nonspecific abnormal finding of lung field: Secondary | ICD-10-CM | POA: Diagnosis not present

## 2022-10-29 DIAGNOSIS — J432 Centrilobular emphysema: Secondary | ICD-10-CM | POA: Diagnosis not present

## 2022-10-29 LAB — CBC WITH DIFFERENTIAL (CANCER CENTER ONLY)
Abs Immature Granulocytes: 1 10*3/uL — ABNORMAL HIGH (ref 0.00–0.07)
Basophils Absolute: 0.1 10*3/uL (ref 0.0–0.1)
Basophils Relative: 1 %
Eosinophils Absolute: 0.1 10*3/uL (ref 0.0–0.5)
Eosinophils Relative: 1 %
HCT: 30.5 % — ABNORMAL LOW (ref 39.0–52.0)
Hemoglobin: 10.2 g/dL — ABNORMAL LOW (ref 13.0–17.0)
Immature Granulocytes: 6 %
Lymphocytes Relative: 9 %
Lymphs Abs: 1.5 10*3/uL (ref 0.7–4.0)
MCH: 31.1 pg (ref 26.0–34.0)
MCHC: 33.4 g/dL (ref 30.0–36.0)
MCV: 93 fL (ref 80.0–100.0)
Monocytes Absolute: 2.1 10*3/uL — ABNORMAL HIGH (ref 0.1–1.0)
Monocytes Relative: 13 %
Neutro Abs: 11.8 10*3/uL — ABNORMAL HIGH (ref 1.7–7.7)
Neutrophils Relative %: 70 %
Platelet Count: 120 10*3/uL — ABNORMAL LOW (ref 150–400)
RBC: 3.28 MIL/uL — ABNORMAL LOW (ref 4.22–5.81)
RDW: 15.3 % (ref 11.5–15.5)
WBC Count: 16.6 10*3/uL — ABNORMAL HIGH (ref 4.0–10.5)
nRBC: 0.2 % (ref 0.0–0.2)

## 2022-10-29 LAB — CMP (CANCER CENTER ONLY)
ALT: 15 U/L (ref 0–44)
AST: 13 U/L — ABNORMAL LOW (ref 15–41)
Albumin: 3.9 g/dL (ref 3.5–5.0)
Alkaline Phosphatase: 128 U/L — ABNORMAL HIGH (ref 38–126)
Anion gap: 4 — ABNORMAL LOW (ref 5–15)
BUN: 15 mg/dL (ref 8–23)
CO2: 27 mmol/L (ref 22–32)
Calcium: 9.2 mg/dL (ref 8.9–10.3)
Chloride: 106 mmol/L (ref 98–111)
Creatinine: 0.78 mg/dL (ref 0.61–1.24)
GFR, Estimated: >60 mL/min (ref >60)
Glucose, Bld: 98 mg/dL (ref 70–99)
Potassium: 3.9 mmol/L (ref 3.5–5.1)
Sodium: 137 mmol/L (ref 135–145)
Total Bilirubin: 0.3 mg/dL (ref 0.3–1.2)
Total Protein: 6.3 g/dL — ABNORMAL LOW (ref 6.5–8.1)

## 2022-10-29 MED ORDER — IOHEXOL 300 MG/ML  SOLN
100.0000 mL | Freq: Once | INTRAMUSCULAR | Status: AC | PRN
  Administered 2022-10-29: 100 mL via INTRAVENOUS

## 2022-10-29 MED ORDER — SODIUM CHLORIDE 0.9% FLUSH
10.0000 mL | Freq: Once | INTRAVENOUS | Status: AC
  Administered 2022-10-29: 10 mL

## 2022-11-01 ENCOUNTER — Other Ambulatory Visit: Payer: Self-pay | Admitting: Internal Medicine

## 2022-11-02 ENCOUNTER — Inpatient Hospital Stay: Payer: Medicare HMO | Attending: Internal Medicine

## 2022-11-02 ENCOUNTER — Encounter: Payer: Self-pay | Admitting: Internal Medicine

## 2022-11-02 ENCOUNTER — Other Ambulatory Visit: Payer: Self-pay

## 2022-11-02 DIAGNOSIS — Z5189 Encounter for other specified aftercare: Secondary | ICD-10-CM | POA: Insufficient documentation

## 2022-11-02 DIAGNOSIS — C3431 Malignant neoplasm of lower lobe, right bronchus or lung: Secondary | ICD-10-CM | POA: Insufficient documentation

## 2022-11-02 DIAGNOSIS — Z5111 Encounter for antineoplastic chemotherapy: Secondary | ICD-10-CM | POA: Insufficient documentation

## 2022-11-02 DIAGNOSIS — C7951 Secondary malignant neoplasm of bone: Secondary | ICD-10-CM | POA: Diagnosis present

## 2022-11-02 DIAGNOSIS — Z79620 Long term (current) use of immunosuppressive biologic: Secondary | ICD-10-CM | POA: Insufficient documentation

## 2022-11-02 DIAGNOSIS — Z95828 Presence of other vascular implants and grafts: Secondary | ICD-10-CM

## 2022-11-02 LAB — CMP (CANCER CENTER ONLY)
ALT: 18 U/L (ref 0–44)
AST: 14 U/L — ABNORMAL LOW (ref 15–41)
Albumin: 3.9 g/dL (ref 3.5–5.0)
Alkaline Phosphatase: 130 U/L — ABNORMAL HIGH (ref 38–126)
Anion gap: 6 (ref 5–15)
BUN: 15 mg/dL (ref 8–23)
CO2: 27 mmol/L (ref 22–32)
Calcium: 9.4 mg/dL (ref 8.9–10.3)
Chloride: 102 mmol/L (ref 98–111)
Creatinine: 0.88 mg/dL (ref 0.61–1.24)
GFR, Estimated: >60 mL/min (ref >60)
Glucose, Bld: 340 mg/dL — ABNORMAL HIGH (ref 70–99)
Potassium: 4.5 mmol/L (ref 3.5–5.1)
Sodium: 135 mmol/L (ref 135–145)
Total Bilirubin: 0.4 mg/dL (ref 0.3–1.2)
Total Protein: 6.4 g/dL — ABNORMAL LOW (ref 6.5–8.1)

## 2022-11-02 LAB — CBC WITH DIFFERENTIAL (CANCER CENTER ONLY)
Abs Immature Granulocytes: 0.46 10*3/uL — ABNORMAL HIGH (ref 0.00–0.07)
Basophils Absolute: 0.1 10*3/uL (ref 0.0–0.1)
Basophils Relative: 1 %
Eosinophils Absolute: 0.1 10*3/uL (ref 0.0–0.5)
Eosinophils Relative: 1 %
HCT: 33.7 % — ABNORMAL LOW (ref 39.0–52.0)
Hemoglobin: 11.3 g/dL — ABNORMAL LOW (ref 13.0–17.0)
Immature Granulocytes: 3 %
Lymphocytes Relative: 7 %
Lymphs Abs: 1.2 10*3/uL (ref 0.7–4.0)
MCH: 31.4 pg (ref 26.0–34.0)
MCHC: 33.5 g/dL (ref 30.0–36.0)
MCV: 93.6 fL (ref 80.0–100.0)
Monocytes Absolute: 1 10*3/uL (ref 0.1–1.0)
Monocytes Relative: 6 %
Neutro Abs: 13.4 10*3/uL — ABNORMAL HIGH (ref 1.7–7.7)
Neutrophils Relative %: 82 %
Platelet Count: 108 10*3/uL — ABNORMAL LOW (ref 150–400)
RBC: 3.6 MIL/uL — ABNORMAL LOW (ref 4.22–5.81)
RDW: 17.6 % — ABNORMAL HIGH (ref 11.5–15.5)
WBC Count: 16.2 10*3/uL — ABNORMAL HIGH (ref 4.0–10.5)
nRBC: 0 % (ref 0.0–0.2)

## 2022-11-02 MED ORDER — HEPARIN SOD (PORK) LOCK FLUSH 100 UNIT/ML IV SOLN
500.0000 [IU] | Freq: Once | INTRAVENOUS | Status: AC
  Administered 2022-11-02: 500 [IU]

## 2022-11-02 MED ORDER — OXYCODONE-ACETAMINOPHEN 5-325 MG PO TABS: 1.0000 | ORAL_TABLET | Freq: Three times a day (TID) | ORAL | 0 refills | Status: DC | PRN

## 2022-11-02 MED ORDER — SODIUM CHLORIDE 0.9% FLUSH
10.0000 mL | Freq: Once | INTRAVENOUS | Status: AC
  Administered 2022-11-02: 10 mL

## 2022-11-06 NOTE — Progress Notes (Signed)
[facility] OFFICE PROGRESS NOTE  Adam Reichmann, DO [address] [place] [address]  DIAGNOSIS:  1) Extensive stage (T1c, N2, M1C) small cell lung cancer presented with right lower lobe lung nodule in addition to right hilar and mediastinal lymphadenopathy and bone metastasis involving thoracic spines as well as the right iliac crest diagnosed in July 2023. 2) immunotherapy mediated type 1 diabetes mellitus diagnosed in October 2023.  PRIOR THERAPY: 1) Systemic chemotherapy with carboplatin for AUC of 5 on day 1, etoposide 100 Mg/M2 on days 1, 2 and 3 with Imfinzi 1500 Mg IV on day 1 and Cosela 240 Mg/M2 on the days of the chemotherapy as well as Imfinzi 1500 Mg IV on day 1 every 3 weeks the first 4 cycles followed by maintenance treatment every 4 weeks starting from cycle #5.  First dose was giving 03/10/2022.  The patient status post 8 cycles.  Last dose was given on August 25, 2022.  Starting from cycle #5 the patient will be on maintenance treatment with Imfinzi 1500 Mg IV every 4 weeks.  This treatment was discontinued secondary to disease progression. 2) Palliative radiotherapy to the enlarging mediastinal lymphadenopathy under the care of Dr. Basilio Cairo.  CURRENT THERAPY:  Systemic chemotherapy again with carboplatin for AUC of 5 on day 1 and etoposide 100 Mg/M2 on days 1, 2 and 3 with Neulasta support.  First dose September 28, 2022.  Status post 2 cycles.   INTERVAL HISTORY: Adam Jordan 77 y.o. male returns to the clinic for a follow up visit. The patient is feeling well today without any concerning complaints except for he is seeing a surgeon tomorrow about inguinal hernia. The patient continues to tolerate treatment with chemotherapy and immunotherapy well without any adverse effects. Denies any fever, chills, night sweats, or weight loss. Denies any chest pain, shortness of breath, cough, or hemoptysis. The only issue with his breathing is related to the  pollen. Denies any nausea, vomiting, diarrhea, or constipation. Denies any headaches. He has some vision changes due to having a hard time seeing far away due to being overdue for an eye exam. Denies any rashes or skin changes. He recently had a restaging CT scan.  The patient is here today for evaluation prior to starting cycle # 3.    MEDICAL HISTORY: Past Medical History:  Diagnosis Date   Anxiety    Arthritis    osteoarthrititis- knees and most joints.   COPD (chronic obstructive pulmonary disease) (HCC)    moderate -no regular use of inhalers- rare use of oxygen as sexual activity   Dyspnea    outside in hot weather and also with pollen   Elevated blood-pressure reading, without diagnosis of hypertension 07/31/2019   Encounter for antineoplastic chemotherapy 03/04/2022   Encounter for antineoplastic immunotherapy 03/04/2022   History of heart artery stent 11/04/2021   Hypercholesterolemia 11/05/2013   Hyperlipidemia    Hypertension    Malaise and fatigue 04/19/2013   Neuromuscular disorder (HCC)    feet   Non-recurrent unilateral inguinal hernia without obstruction or gangrene 07/21/2021   Paroxysmal atrial flutter (HCC) 01/15/2021   Port-A-Cath in place 03/31/2022   Routine history and physical examination of adult 11/05/2013   Shoulder joint pain 08/23/2013   Status post lumbar microdiscectomy 07/31/2019   Thoracoabdominal aneurysm [facility])    s/p FEVAR 4 Vessel TABME 02/19/20 Dr. Michael Boston    ALLERGIES:  is allergic to losartan, atorvastatin calcium, and pollen extract-tree extract [pollen extract].  MEDICATIONS:  Current Outpatient Medications  Medication Sig Dispense Refill   Alum Hydroxide-Mag Trisilicate (GAVISCON) 80-14.2 MG CHEW Chew 1 tablet by mouth daily as needed (heartburn).     apixaban (ELIQUIS) 5 MG TABS tablet Take 1 tablet by mouth twice daily 180 tablet 1   atorvastatin (LIPITOR) 20 MG tablet Take 20 mg by mouth at bedtime.     blood glucose meter kit and supplies  Dispense based on patient and insurance preference. Use up to four times daily as directed. (FOR ICD-10 E10.9, E11.9). (Patient taking differently: 1 each by Other route See admin instructions. Dispense based on patient and insurance preference. Use up to four times daily as directed. (FOR ICD-10 E10.9, E11.9).) 1 each 0   clonazePAM (KLONOPIN) 1 MG tablet Take 1 tablet (1 mg total) by mouth 2 (two) times daily as needed for anxiety. 60 tablet 5   Continuous Blood Gluc Sensor (DEXCOM G7 SENSOR) MISC 1 Device by Does not apply route as directed. 9 each 3   fluticasone (FLONASE) 50 MCG/ACT nasal spray Place 1 spray into both nostrils 2 (two) times daily as needed for allergies.     insulin aspart (NOVOLOG FLEXPEN) 100 UNIT/ML FlexPen Max daily 30 units (Patient taking differently: Inject 1 Units into the skin 3 (three) times daily with meals. Max daily 30 units) 15 mL 11   insulin glargine (LANTUS) 100 UNIT/ML Solostar Pen Inject 24 Units into the skin daily. 30 mL 3   Insulin Pen Needle 32G X 4 MM MISC 1 Device by Does not apply route in the morning, at noon, in the evening, and at bedtime. 400 each 3   Lancets (ONETOUCH DELICA PLUS LANCET33G) MISC Apply 1 each topically 4 (four) times daily.     lidocaine-prilocaine (EMLA) cream Apply to the Port-A-Cath site 30-60 minutes before chemotherapy (Patient taking differently: Apply 1 Application topically See admin instructions. Apply to the Port-A-Cath site 30-60 minutes before chemotherapy) 30 g 0   meloxicam (MOBIC) 15 MG tablet Take 15 mg by mouth daily as needed for pain.     Multiple Vitamin (MULTIVITAMIN WITH MINERALS) TABS tablet Take 1 tablet by mouth daily. 30 tablet 5   Naphazoline HCl (CLEAR EYES OP) Place 1 drop into both eyes daily.     nitroGLYCERIN (NITROSTAT) 0.4 MG SL tablet Place 1 tablet (0.4 mg total) under the tongue every 5 (five) minutes as needed for chest pain. 25 tablet 6   ONETOUCH ULTRA test strip 1 each by Other route 3 (three)  times daily. for testing 300 each 3   oxyCODONE-acetaminophen (PERCOCET/ROXICET) 5-325 MG tablet Take 1 tablet by mouth every 8 (eight) hours as needed for severe pain. 30 tablet 0   PROAIR HFA 108 (90 BASE) MCG/ACT inhaler Inhale 2 puffs into the lungs every 6 (six) hours as needed for wheezing or shortness of breath.   2   sildenafil (REVATIO) 20 MG tablet Take 60 mg by mouth daily as needed (ED).     Tiotropium Bromide-Olodaterol (STIOLTO RESPIMAT) 2.5-2.5 MCG/ACT AERS Inhale 2 puffs into the lungs daily. 4 g 0   traZODone (DESYREL) 50 MG tablet Take 50 mg by mouth at bedtime.     triamcinolone cream (KENALOG) 0.1 % Apply 1 Application topically daily.     No current facility-administered medications for this visit.    SURGICAL HISTORY:  Past Surgical History:  Procedure Laterality Date   ABDOMINAL AORTIC ANEURYSM REPAIR  02/06/2020   BRONCHIAL BIOPSY  02/23/2022   Procedure: BRONCHIAL  BIOPSIES;  Surgeon: Leslye Peer, MD;  Location: [facility] ENDOSCOPY;  Service: Pulmonary;;   BRONCHIAL BRUSHINGS  02/23/2022   Procedure: BRONCHIAL BRUSHINGS;  Surgeon: Leslye Peer, MD;  Location: [facility] ENDOSCOPY;  Service: Pulmonary;;   BRONCHIAL NEEDLE ASPIRATION BIOPSY  02/23/2022   Procedure: BRONCHIAL NEEDLE ASPIRATION BIOPSIES;  Surgeon: Leslye Peer, MD;  Location[facility] ENDOSCOPY;  Service: Pulmonary;;   BROW LIFT Bilateral 12/11/2021   Procedure: UPPER LID BLEPHAROPLASTY;  Surgeon: Glenna Fellows, MD;  Location: [facility];  Service: Plastics;  Laterality: Bilateral;   CYST REMOVAL LEG Left 07/03/2021   Procedure: EXCISION CYST LEFT BUTTOCK;  Surgeon: Griselda Miner, MD;  Location: [facility] OR;  Service: General;  Laterality: Left;   EYE SURGERY Bilateral    cataract surgery   FINGER ARTHROPLASTY Left    left thumb-Dr. Amanda Pea   INGUINAL HERNIA REPAIR Left 07/03/2021   Procedure: LEFT INGUINAL HERNIA REPAIR WITH MESH;  Surgeon: Griselda Miner, MD;  Location: [facility] OR;  Service: General;   Laterality: Left;   INSERTION OF MESH N/A 07/03/2021   Procedure: INSERTION OF MESH X2;  Surgeon: Griselda Miner, MD;  Location: [facility] OR;  Service: General;  Laterality: N/A;   IR IMAGING GUIDED PORT INSERTION  03/12/2022   IR RADIOLOGIST EVAL & MGMT  10/25/2019   KNEE ARTHROSCOPY Left    KNEE SURGERY Left    Bakers cyst x2   SHOULDER ARTHROSCOPY Right    thumb surgery     TONSILLECTOMY     TOTAL KNEE ARTHROPLASTY Left 03/19/2016   Procedure: LEFT TOTAL KNEE ARTHROPLASTY;  Surgeon: Eugenia Mcalpine, MD;  Location: [facility] ORS;  Service: Orthopedics;  Laterality: Left;   TOTAL SHOULDER REPLACEMENT Right    UMBILICAL HERNIA REPAIR N/A 07/03/2021   Procedure: UMBILICAL HERNIA REPAIR WITH MESH;  Surgeon: Griselda Miner, MD;  Location: [facility] OR;  Service: General;  Laterality: N/A;   VASCULAR SURGERY     VASECTOMY     VIDEO BRONCHOSCOPY WITH ENDOBRONCHIAL ULTRASOUND Right 02/23/2022   Procedure: VIDEO BRONCHOSCOPY WITH ENDOBRONCHIAL ULTRASOUND;  Surgeon: Leslye Peer, MD;  Location: [facility] ENDOSCOPY;  Service: Pulmonary;  Laterality: Right;   VIDEO BRONCHOSCOPY WITH RADIAL ENDOBRONCHIAL ULTRASOUND  02/23/2022   Procedure: VIDEO BRONCHOSCOPY WITH RADIAL ENDOBRONCHIAL ULTRASOUND;  Surgeon: Leslye Peer, MD;  Location: [facility] ENDOSCOPY;  Service: Pulmonary;;    REVIEW OF SYSTEMS:   Review of Systems  Constitutional: Negative for appetite change, chills, fatigue, fever and unexpected weight change.  HENT:   Negative for mouth sores, nosebleeds, sore throat and trouble swallowing.   Eyes: Negative for eye problems and icterus.  Respiratory: Negative for cough, hemoptysis, shortness of breath and wheezing.   Cardiovascular: Negative for chest pain and leg swelling.  Gastrointestinal: Positive for discomfort near inguinal hernia. Negative for abdominal pain, constipation, diarrhea, nausea and vomiting.  Genitourinary: Negative for bladder incontinence, difficulty urinating, dysuria, frequency and hematuria.    Musculoskeletal: Positive for back pain. Negative for gait problem, neck pain and neck stiffness.  Skin: Negative for itching and rash.  Neurological: Negative for dizziness, extremity weakness, gait problem, headaches, light-headedness and seizures.  Hematological: Negative for adenopathy. Does not bruise/bleed easily.  Psychiatric/Behavioral: Negative for confusion, depression and sleep disturbance. The patient is not nervous/anxious.     PHYSICAL EXAMINATION:  Blood pressure (!) 141/64, pulse 75, temperature 98.2 F (36.8 C), temperature source Oral, resp. rate 18, height 5\' 8"  (1.727 m), weight 157 lb 11.2 oz (71.5 kg), SpO2 98 %.  ECOG PERFORMANCE STATUS: 1  Physical Exam  Constitutional: Oriented to person, place, and time and well-developed, well-nourished, and in no distress.  HENT:  Head: Normocephalic and atraumatic.  Mouth/Throat: Oropharynx is clear and moist. No oropharyngeal exudate.  Eyes: Conjunctivae are normal. Right eye exhibits no discharge. Left eye exhibits no discharge. No scleral icterus.  Neck: Normal range of motion. Neck supple.  Cardiovascular: Normal rate, regular rhythm, normal heart sounds and intact distal pulses.   Pulmonary/Chest: Effort normal and breath sounds normal. No respiratory distress. No wheezes. No rales.  Abdominal: Soft. Bowel sounds are normal. Exhibits no distension and no mass. There is no tenderness.  Musculoskeletal: Normal range of motion. Exhibits no edema.  Lymphadenopathy:    No cervical adenopathy.  Neurological: Alert and oriented to person, place, and time. Exhibits normal muscle tone. Gait normal. Coordination normal.  Skin: Skin is warm and dry. No rash noted. Not diaphoretic. No erythema. No pallor.  Psychiatric: Mood, memory and judgment normal.  Vitals reviewed.  LABORATORY DATA: Lab Results  Component Value Date   WBC 8.9 11/09/2022   HGB 10.8 (L) 11/09/2022   HCT 32.3 (L) 11/09/2022   MCV 95.3 11/09/2022   PLT  208 11/09/2022      Chemistry      Component Value Date/Time   NA 135 11/02/2022 1257   NA 138 02/03/2021 0944   K 4.5 11/02/2022 1257   CL 102 11/02/2022 1257   CO2 27 11/02/2022 1257   BUN 15 11/02/2022 1257   BUN 13 02/03/2021 0944   CREATININE 0.88 11/02/2022 1257      Component Value Date/Time   CALCIUM 9.4 11/02/2022 1257   ALKPHOS 130 (H) 11/02/2022 1257   AST 14 (L) 11/02/2022 1257   ALT 18 11/02/2022 1257   BILITOT 0.4 11/02/2022 1257       RADIOGRAPHIC STUDIES:  CT Chest W Contrast  Result Date: 10/31/2022 CLINICAL DATA:  77 year old male with history of small cell lung cancer. Staging examination. * Tracking Code: BO * EXAM: CT CHEST, ABDOMEN, AND PELVIS WITH CONTRAST TECHNIQUE: Multidetector CT imaging of the chest, abdomen and pelvis was performed following the standard protocol during bolus administration of intravenous contrast. RADIATION DOSE REDUCTION: This exam was performed according to the departmental dose-optimization program which includes automated exposure control, adjustment of the mA and/or kV according to patient size and/or use of iterative reconstruction technique. CONTRAST:  OMNIPAQUE IOHEXOL 300 MG/ML  SOLN COMPARISON:  Multiple priors, most recently chest CT, abdomen and pelvis 09/20/2022. FINDINGS: CT CHEST FINDINGS Cardiovascular: Heart size is normal. There is no significant pericardial fluid, thickening or pericardial calcification. There is aortic atherosclerosis, as well as atherosclerosis of the great vessels of the mediastinum and the coronary arteries, including calcified atherosclerotic plaque in the left main, left anterior descending, left circumflex and right coronary arteries. Calcifications of the aortic valve. Right internal jugular single-lumen Port-A-Cath with tip terminating in the distal superior vena cava. Aortic endograft in the distal descending thoracic aorta and upper abdominal aorta incompletely imaged, with multiple stents  in the distal descending thoracic aorta also noted. Mediastinum/Nodes: No pathologically enlarged mediastinal or hilar lymph nodes. Esophagus is unremarkable in appearance. No axillary lymphadenopathy. Lungs/Pleura: Previously described right lower lobe pulmonary nodules all appear essentially stable compared to the recent prior study, with the largest of these (axial image 111 of series 7) currently measuring 13 x 8 mm. Small new nodular area of architectural distortion in the posterior aspect of the left lower lobe (  axial image 107 of series 7) measuring 7 x 3 mm (mean diameter 5 mm), nonspecific. No larger more suspicious appearing pulmonary nodules or masses are noted. Areas of chronic post infectious or inflammatory scarring are again noted, most evident in the right middle lobe and inferior segment of the lingula. Diffuse bronchial wall thickening with moderate centrilobular and paraseptal emphysema. Musculoskeletal: Status post right shoulder hemiarthroplasty. There are no aggressive appearing lytic or blastic lesions noted in the visualized portions of the skeleton. CT ABDOMEN PELVIS FINDINGS Hepatobiliary: Several small hypovascular hepatic lesions are again noted, concerning for possible metastatic lesions. The largest of these include a subcapsular lesion on the border of segment 4B and 5 (axial image 27 of series 2) which currently measures 2.2 x 1.3 cm (previously 1.8 x 1.1 cm), a poorly defined lesion between segments 5 and 6 (axial image 19 of series 2) currently measuring 1.8 x 1.2 cm (not readily apparent on the prior examination), and a 1.1 x 0.7 cm lesion in segment 6 (axial image 18 of series 2) which previously measured only 4 mm. No intra or extrahepatic biliary ductal dilatation. Gallbladder is unremarkable in appearance. Pancreas: No pancreatic mass. No pancreatic ductal dilatation. No pancreatic or peripancreatic fluid collections or inflammatory changes. Spleen: Unremarkable.  Adrenals/Urinary Tract: Bilateral kidneys and right adrenal gland are normal in appearance. 1.5 x 1.4 cm left adrenal nodule, similar to the prior study. No hydroureteronephrosis. Urinary bladder is unremarkable in appearance. Stomach/Bowel: The appearance of the stomach is unremarkable. There is no pathologic dilatation of small bowel or colon. Numerous colonic diverticula are noted, particularly in the sigmoid colon, without surrounding inflammatory changes to indicate an acute diverticulitis at this time. The appendix is not confidently identified and may be surgically absent. Regardless, there are no inflammatory changes noted adjacent to the cecum to suggest the presence of an acute appendicitis at this time. Vascular/Lymphatic: Extensive aortic atherosclerosis with fusiform aneurysmal dilatation of the infrarenal abdominal aorta which measures up to 4.6 x 3.8 cm, similar to prior studies. Vascular stents are present in the proximal celiac axis, superior mesenteric artery and renal arteries bilaterally. Aorto bi-iliac stent grafts are also noted, which appear grossly patent. No lymphadenopathy noted in the abdomen or pelvis. Reproductive: Prostate gland and seminal vesicles are unremarkable in appearance. Other: No significant volume of ascites.  No pneumoperitoneum. Musculoskeletal: There are no aggressive appearing lytic or blastic lesions noted in the visualized portions of the skeleton. IMPRESSION: 1. Today's study demonstrates slight progression of hypovascular liver lesions which remain concerning for metastatic lesions. This could be definitively evaluated with MRI of the abdomen with and without IV gadolinium if clinically appropriate. 2. Previously noted small pulmonary nodules appear generally stable compared to the prior study, with the exception of a new small left lower lobe pulmonary nodule which is highly nonspecific. Close attention on follow-up studies is recommended. 3. Stable size of left  adrenal nodule. 4. Additional incidental findings, similar to prior studies, as above. Electronically Signed   By: Trudie Reed M.D.   On: 10/31/2022 11:25   CT Abdomen Pelvis W Contrast  Result Date: 10/31/2022 CLINICAL DATA:  77 year old male with history of small cell lung cancer. Staging examination. * Tracking Code: BO * EXAM: CT CHEST, ABDOMEN, AND PELVIS WITH CONTRAST TECHNIQUE: Multidetector CT imaging of the chest, abdomen and pelvis was performed following the standard protocol during bolus administration of intravenous contrast. RADIATION DOSE REDUCTION: This exam was performed according to the departmental dose-optimization program which includes automated  exposure control, adjustment of the mA and/or kV according to patient size and/or use of iterative reconstruction technique. CONTRAST:  100mL OMNIPAQUE IOHEXOL 300 MG/ML  SOLN COMPARISON:  Multiple priors, most recently chest CT, abdomen and pelvis 09/20/2022. FINDINGS: CT CHEST FINDINGS Cardiovascular: Heart size is normal. There is no significant pericardial fluid, thickening or pericardial calcification. There is aortic atherosclerosis, as well as atherosclerosis of the great vessels of the mediastinum and the coronary arteries, including calcified atherosclerotic plaque in the left main, left anterior descending, left circumflex and right coronary arteries. Calcifications of the aortic valve. Right internal jugular single-lumen Port-A-Cath with tip terminating in the distal superior vena cava. Aortic endograft in the distal descending thoracic aorta and upper abdominal aorta incompletely imaged, with multiple stents in the distal descending thoracic aorta also noted. Mediastinum/Nodes: No pathologically enlarged mediastinal or hilar lymph nodes. Esophagus is unremarkable in appearance. No axillary lymphadenopathy. Lungs/Pleura: Previously described right lower lobe pulmonary nodules all appear essentially stable compared to the recent prior  study, with the largest of these (axial image 111 of series 7) currently measuring 13 x 8 mm. Small new nodular area of architectural distortion in the posterior aspect of the left lower lobe (axial image 107 of series 7) measuring 7 x 3 mm (mean diameter 5 mm), nonspecific. No larger more suspicious appearing pulmonary nodules or masses are noted. Areas of chronic post infectious or inflammatory scarring are again noted, most evident in the right middle lobe and inferior segment of the lingula. Diffuse bronchial wall thickening with moderate centrilobular and paraseptal emphysema. Musculoskeletal: Status post right shoulder hemiarthroplasty. There are no aggressive appearing lytic or blastic lesions noted in the visualized portions of the skeleton. CT ABDOMEN PELVIS FINDINGS Hepatobiliary: Several small hypovascular hepatic lesions are again noted, concerning for possible metastatic lesions. The largest of these include a subcapsular lesion on the border of segment 4B and 5 (axial image 27 of series 2) which currently measures 2.2 x 1.3 cm (previously 1.8 x 1.1 cm), a poorly defined lesion between segments 5 and 6 (axial image 19 of series 2) currently measuring 1.8 x 1.2 cm (not readily apparent on the prior examination), and a 1.1 x 0.7 cm lesion in segment 6 (axial image 18 of series 2) which previously measured only 4 mm. No intra or extrahepatic biliary ductal dilatation. Gallbladder is unremarkable in appearance. Pancreas: No pancreatic mass. No pancreatic ductal dilatation. No pancreatic or peripancreatic fluid collections or inflammatory changes. Spleen: Unremarkable. Adrenals/Urinary Tract: Bilateral kidneys and right adrenal gland are normal in appearance. 1.5 x 1.4 cm left adrenal nodule, similar to the prior study. No hydroureteronephrosis. Urinary bladder is unremarkable in appearance. Stomach/Bowel: The appearance of the stomach is unremarkable. There is no pathologic dilatation of small bowel or  colon. Numerous colonic diverticula are noted, particularly in the sigmoid colon, without surrounding inflammatory changes to indicate an acute diverticulitis at this time. The appendix is not confidently identified and may be surgically absent. Regardless, there are no inflammatory changes noted adjacent to the cecum to suggest the presence of an acute appendicitis at this time. Vascular/Lymphatic: Extensive aortic atherosclerosis with fusiform aneurysmal dilatation of the infrarenal abdominal aorta which measures up to 4.6 x 3.8 cm, similar to prior studies. Vascular stents are present in the proximal celiac axis, superior mesenteric artery and renal arteries bilaterally. Aorto bi-iliac stent grafts are also noted, which appear grossly patent. No lymphadenopathy noted in the abdomen or pelvis. Reproductive: Prostate gland and seminal vesicles are unremarkable in appearance.  Other: No significant volume of ascites.  No pneumoperitoneum. Musculoskeletal: There are no aggressive appearing lytic or blastic lesions noted in the visualized portions of the skeleton. IMPRESSION: 1. Today's study demonstrates slight progression of hypovascular liver lesions which remain concerning for metastatic lesions. This could be definitively evaluated with MRI of the abdomen with and without IV gadolinium if clinically appropriate. 2. Previously noted small pulmonary nodules appear generally stable compared to the prior study, with the exception of a new small left lower lobe pulmonary nodule which is highly nonspecific. Close attention on follow-up studies is recommended. 3. Stable size of left adrenal nodule. 4. Additional incidental findings, similar to prior studies, as above. Electronically Signed   By: Trudie Reed M.D.   On: 10/31/2022 11:25     ASSESSMENT/PLAN:  This is a very pleasant 77 year old Caucasian male with extensive stage (T1c, N2, M1C) small cell lung cancer presented with right lower lobe lung nodule in  addition to right hilar and mediastinal lymphadenopathy and bone metastasis involving thoracic spines as well as the right iliac crest diagnosed in July 2023.   The patient started systemic chemotherapy with carboplatin for AUC of 5 on day 1, etoposide 100 Mg/M2 on days 1, 2 and 3 as well as Cosela 240 Mg/M2 on the days of the chemotherapy and Imfinzi 1500 Mg IV every 3 weeks with the induction treatment. He was status post 8 cycles. Starting from cycle #5 the patient was on maintenance treatment with Imfinzi 1500 Mg IV every 4 weeks. Last dose was given on August 25, 2022 discontinued secondary to disease progression.   He then underwent palliative radiotherapy to the enlarging mediastinal lymphadenopathy under the care of Dr. Basilio Cairo.   The patient tolerated his previous course of systemic chemotherapy as well as the palliative radiation fairly well except for the aching pain in the sternal area after the radiation.   He then had a repeat CT CAP that showed His scan showed interval improvement in the mediastinal and right hilar adenopathy but unfortunately he has developed a new liver lesion worrisome for liver metastasis as well as developing left adrenal mass again worrisome for metastasis.   He started systemic chemotherapy again with carboplatin for AUC of 5 on day 1 and etoposide 100 Mg/M2 on days 1, 2 and 3 with Neulasta support every 3 weeks for 4 cycles.  He is status post 2 cycles.   The patient has been tolerating this treatment well with no concerning adverse effects except for the fatigue and aching pain after the Neulasta injection.   The patient was seen with Dr. Arbutus Ped. Labs were reviewed.  Mohamed personally and independently reviewed the patient's scan and discussed the results with the patient today.  The scan showed we will disease/possible slight enlargement of a liver lesion.  Dr. Arbutus Ped recommends that we monitor this for now on subsequent imaging.  Recommends that the patient  continue on the same treatment at the same dose for now.  He will proceed with cycle #3 today as scheduled.  We will see him back for a follow up visit in 3 weeks for evaluation and repeat blood work before start cycle #4.   For his allergies, the patient was encouraged to take antihistamine daily such as Claritin.  The patient was advised to call immediately if she has any concerning symptoms in the interval. The patient voices understanding of current disease status and treatment options and is in agreement with the current care plan. All questions  were answered. The patient knows to call the clinic with any problems, questions or concerns. We can certainly see the patient much sooner if necessary  No orders of the defined types were placed in this encounter.    Waver Dibiasio L Reena Borromeo, PA-C 11/09/22  ADDENDUM: Hematology/Oncology Attending: I had a face-to-face encounter with the patient today.  I reviewed his record, lab, scan and recommended his care plan.  This is a very pleasant 77 years old white male with extensive stage small cell lung cancer initially diagnosed in July 2023 status post systemic chemotherapy with carboplatin, etoposide and Imfinzi for 4 cycles followed by maintenance treatment with single agent Imfinzi for 4 more cycles before it was discontinued secondary to disease progression.  The patient also received palliative radiotherapy to enlarging mediastinal lymph nodes under the care of Dr. Basilio Cairo. At the time of the disease progression we started him again on systemic chemotherapy with carboplatin and Doutova side status post 2 cycles.  He has been tolerating this treatment well except for fatigue. He had repeat CT scan of the chest performed recently.  I personally and independently reviewed the scan and discussed the result with the patient today. His scan showed no concerning findings for disease progression except for a slight increase in size of a liver lesion  that we will need to monitor closely. The patient is in agreement with the current plan and he will proceed with cycle #3 of his systemic chemotherapy today. He will come back for follow-up visit in 3 weeks for evaluation before the next cycle of his treatment. He was advised to call immediately if he has any concerning symptoms in the interval. The total time spent in the appointment was 30 minutes. Disclaimer: This note was dictated with voice recognition software. Similar sounding words can inadvertently be transcribed and may be missed upon review. Lajuana Matte, MD

## 2022-11-08 MED FILL — Dexamethasone Sodium Phosphate Inj 100 MG/10ML: INTRAMUSCULAR | Qty: 1 | Status: AC

## 2022-11-08 MED FILL — Fosaprepitant Dimeglumine For IV Infusion 150 MG (Base Eq): INTRAVENOUS | Qty: 5 | Status: AC

## 2022-11-09 ENCOUNTER — Inpatient Hospital Stay: Payer: Medicare HMO

## 2022-11-09 ENCOUNTER — Inpatient Hospital Stay (HOSPITAL_BASED_OUTPATIENT_CLINIC_OR_DEPARTMENT_OTHER): Payer: Medicare HMO | Admitting: Physician Assistant

## 2022-11-09 VITALS — BP 141/64 | HR 75 | Temp 98.2°F | Resp 18 | Ht 68.0 in | Wt 157.7 lb

## 2022-11-09 VITALS — BP 127/70 | HR 68 | Temp 98.6°F | Resp 16

## 2022-11-09 DIAGNOSIS — Z5189 Encounter for other specified aftercare: Secondary | ICD-10-CM | POA: Diagnosis not present

## 2022-11-09 DIAGNOSIS — C7951 Secondary malignant neoplasm of bone: Secondary | ICD-10-CM | POA: Diagnosis not present

## 2022-11-09 DIAGNOSIS — Z5112 Encounter for antineoplastic immunotherapy: Secondary | ICD-10-CM

## 2022-11-09 DIAGNOSIS — C3431 Malignant neoplasm of lower lobe, right bronchus or lung: Secondary | ICD-10-CM

## 2022-11-09 DIAGNOSIS — Z95828 Presence of other vascular implants and grafts: Secondary | ICD-10-CM

## 2022-11-09 DIAGNOSIS — Z5111 Encounter for antineoplastic chemotherapy: Secondary | ICD-10-CM | POA: Diagnosis not present

## 2022-11-09 DIAGNOSIS — Z79620 Long term (current) use of immunosuppressive biologic: Secondary | ICD-10-CM | POA: Diagnosis not present

## 2022-11-09 LAB — TSH: TSH: 1.805 u[IU]/mL (ref 0.350–4.500)

## 2022-11-09 LAB — CBC WITH DIFFERENTIAL (CANCER CENTER ONLY)
Abs Immature Granulocytes: 0.05 10*3/uL (ref 0.00–0.07)
Basophils Absolute: 0.1 10*3/uL (ref 0.0–0.1)
Basophils Relative: 1 %
Eosinophils Absolute: 0.1 10*3/uL (ref 0.0–0.5)
Eosinophils Relative: 1 %
HCT: 32.3 % — ABNORMAL LOW (ref 39.0–52.0)
Hemoglobin: 10.8 g/dL — ABNORMAL LOW (ref 13.0–17.0)
Immature Granulocytes: 1 %
Lymphocytes Relative: 8 %
Lymphs Abs: 0.7 10*3/uL (ref 0.7–4.0)
MCH: 31.9 pg (ref 26.0–34.0)
MCHC: 33.4 g/dL (ref 30.0–36.0)
MCV: 95.3 fL (ref 80.0–100.0)
Monocytes Absolute: 0.8 10*3/uL (ref 0.1–1.0)
Monocytes Relative: 9 %
Neutro Abs: 7.2 10*3/uL (ref 1.7–7.7)
Neutrophils Relative %: 80 %
Platelet Count: 208 10*3/uL (ref 150–400)
RBC: 3.39 MIL/uL — ABNORMAL LOW (ref 4.22–5.81)
RDW: 18.4 % — ABNORMAL HIGH (ref 11.5–15.5)
WBC Count: 8.9 10*3/uL (ref 4.0–10.5)
nRBC: 0 % (ref 0.0–0.2)

## 2022-11-09 LAB — CMP (CANCER CENTER ONLY)
ALT: 13 U/L (ref 0–44)
AST: 13 U/L — ABNORMAL LOW (ref 15–41)
Albumin: 3.7 g/dL (ref 3.5–5.0)
Alkaline Phosphatase: 86 U/L (ref 38–126)
Anion gap: 6 (ref 5–15)
BUN: 14 mg/dL (ref 8–23)
CO2: 26 mmol/L (ref 22–32)
Calcium: 9.2 mg/dL (ref 8.9–10.3)
Chloride: 106 mmol/L (ref 98–111)
Creatinine: 0.81 mg/dL (ref 0.61–1.24)
GFR, Estimated: >60 mL/min (ref >60)
Glucose, Bld: 156 mg/dL — ABNORMAL HIGH (ref 70–99)
Potassium: 4.2 mmol/L (ref 3.5–5.1)
Sodium: 138 mmol/L (ref 135–145)
Total Bilirubin: 0.4 mg/dL (ref 0.3–1.2)
Total Protein: 6.1 g/dL — ABNORMAL LOW (ref 6.5–8.1)

## 2022-11-09 MED ORDER — PALONOSETRON HCL INJECTION 0.25 MG/5ML
0.2500 mg | Freq: Once | INTRAVENOUS | Status: AC
  Administered 2022-11-09: 0.25 mg via INTRAVENOUS
  Filled 2022-11-09: qty 5

## 2022-11-09 MED ORDER — SODIUM CHLORIDE 0.9 % IV SOLN: Freq: Once | INTRAVENOUS | Status: AC

## 2022-11-09 MED ORDER — SODIUM CHLORIDE 0.9 % IV SOLN
150.0000 mg | Freq: Once | INTRAVENOUS | Status: AC
  Administered 2022-11-09: 150 mg via INTRAVENOUS
  Filled 2022-11-09: qty 150

## 2022-11-09 MED ORDER — HEPARIN SOD (PORK) LOCK FLUSH 100 UNIT/ML IV SOLN
500.0000 [IU] | Freq: Once | INTRAVENOUS | Status: AC | PRN
  Administered 2022-11-09: 500 [IU]

## 2022-11-09 MED ORDER — SODIUM CHLORIDE 0.9% FLUSH
10.0000 mL | INTRAVENOUS | Status: DC | PRN
  Administered 2022-11-09: 10 mL

## 2022-11-09 MED ORDER — SODIUM CHLORIDE 0.9 % IV SOLN
446.5000 mg | Freq: Once | INTRAVENOUS | Status: AC
  Administered 2022-11-09: 450 mg via INTRAVENOUS
  Filled 2022-11-09: qty 45

## 2022-11-09 MED ORDER — SODIUM CHLORIDE 0.9% FLUSH
10.0000 mL | Freq: Once | INTRAVENOUS | Status: AC
  Administered 2022-11-09: 10 mL

## 2022-11-09 MED ORDER — DIPHENHYDRAMINE HCL 25 MG PO CAPS
25.0000 mg | ORAL_CAPSULE | Freq: Once | ORAL | Status: AC
  Administered 2022-11-09: 25 mg via ORAL
  Filled 2022-11-09: qty 1

## 2022-11-09 MED ORDER — FAMOTIDINE IN NACL 20-0.9 MG/50ML-% IV SOLN
20.0000 mg | Freq: Once | INTRAVENOUS | Status: AC
  Administered 2022-11-09: 20 mg via INTRAVENOUS
  Filled 2022-11-09: qty 50

## 2022-11-09 MED ORDER — SODIUM CHLORIDE 0.9 % IV SOLN
10.0000 mg | Freq: Once | INTRAVENOUS | Status: AC
  Administered 2022-11-09: 10 mg via INTRAVENOUS
  Filled 2022-11-09: qty 10

## 2022-11-09 MED ORDER — SODIUM CHLORIDE 0.9 % IV SOLN
100.0000 mg/m2 | Freq: Once | INTRAVENOUS | Status: AC
  Administered 2022-11-09: 200 mg via INTRAVENOUS
  Filled 2022-11-09: qty 10

## 2022-11-09 MED FILL — Dexamethasone Sodium Phosphate Inj 100 MG/10ML: INTRAMUSCULAR | Qty: 1 | Status: AC

## 2022-11-09 NOTE — Progress Notes (Signed)
Patient seen by Cassie Heilingoetter, PA-C  Vitals are within treatment parameters.  Labs reviewed: and are within treatment parameters.  Per physician team, patient is ready for treatment and there are NO modifications to the treatment plan.  

## 2022-11-09 NOTE — Patient Instructions (Signed)
[facility]  Discharge Instructions: Thank you for choosing [facility] to provide your oncology and hematology care.   If you have a lab appointment with the Cancer Center, please go directly to the Cancer Center and check in at the registration area.   Wear comfortable clothing and clothing appropriate for easy access to any Portacath or PICC line.   We strive to give you quality time with your provider. You may need to reschedule your appointment if you arrive late (15 or more minutes).  Arriving late affects you and other patients whose appointments are after yours.  Also, if you miss three or more appointments without notifying the office, you may be dismissed from the clinic at the provider's discretion.      For prescription refill requests, have your pharmacy contact our office and allow 72 hours for refills to be completed.    Today you received the following chemotherapy and/or immunotherapy agents: Carboplatin and Etoposide      To help prevent nausea and vomiting after your treatment, we encourage you to take your nausea medication as directed.  BELOW ARE SYMPTOMS THAT SHOULD BE REPORTED IMMEDIATELY: *FEVER GREATER THAN 100.4 F (38 C) OR HIGHER *CHILLS OR SWEATING *NAUSEA AND VOMITING THAT IS NOT CONTROLLED WITH YOUR NAUSEA MEDICATION *UNUSUAL SHORTNESS OF BREATH *UNUSUAL BRUISING OR BLEEDING *URINARY PROBLEMS (pain or burning when urinating, or frequent urination) *BOWEL PROBLEMS (unusual diarrhea, constipation, pain near the anus) TENDERNESS IN MOUTH AND THROAT WITH OR WITHOUT PRESENCE OF ULCERS (sore throat, sores in mouth, or a toothache) UNUSUAL RASH, SWELLING OR PAIN  UNUSUAL VAGINAL DISCHARGE OR ITCHING   Items with * indicate a potential emergency and should be followed up as soon as possible or go to the Emergency Department if any problems should occur.  Please show the CHEMOTHERAPY ALERT CARD or IMMUNOTHERAPY  ALERT CARD at check-in to the Emergency Department and triage nurse.  Should you have questions after your visit or need to cancel or reschedule your appointment, please contact [facility]  Dept: [phone]  and follow the prompts.  Office hours are 8:00 a.m. to 4:30 p.m. Monday - Friday. Please note that voicemails left after 4:00 p.m. may not be returned until the following business day.  We are closed weekends and major holidays. You have access to a nurse at all times for urgent questions. Please call the main number to the clinic Dept: [phone] and follow the prompts.   For any non-urgent questions, you may also contact your provider using MyChart. We now offer e-Visits for anyone 18 and older to request care online for non-urgent symptoms. For details visit [email or web address].   Also download the MyChart app! Go to the app store, search "MyChart", open the app, select [place], and log in with your MyChart username and password.   

## 2022-11-10 ENCOUNTER — Other Ambulatory Visit: Payer: Self-pay

## 2022-11-10 ENCOUNTER — Inpatient Hospital Stay: Payer: Medicare HMO

## 2022-11-10 VITALS — BP 152/61 | HR 72 | Temp 98.1°F | Resp 16

## 2022-11-10 DIAGNOSIS — Z5111 Encounter for antineoplastic chemotherapy: Secondary | ICD-10-CM | POA: Diagnosis not present

## 2022-11-10 DIAGNOSIS — C3431 Malignant neoplasm of lower lobe, right bronchus or lung: Secondary | ICD-10-CM

## 2022-11-10 DIAGNOSIS — Z5189 Encounter for other specified aftercare: Secondary | ICD-10-CM | POA: Diagnosis not present

## 2022-11-10 DIAGNOSIS — C7951 Secondary malignant neoplasm of bone: Secondary | ICD-10-CM | POA: Diagnosis not present

## 2022-11-10 DIAGNOSIS — Z79620 Long term (current) use of immunosuppressive biologic: Secondary | ICD-10-CM | POA: Diagnosis not present

## 2022-11-10 DIAGNOSIS — K4090 Unilateral inguinal hernia, without obstruction or gangrene, not specified as recurrent: Secondary | ICD-10-CM | POA: Diagnosis not present

## 2022-11-10 DIAGNOSIS — K439 Ventral hernia without obstruction or gangrene: Secondary | ICD-10-CM

## 2022-11-10 HISTORY — DX: Ventral hernia without obstruction or gangrene: K43.9

## 2022-11-10 MED ORDER — SODIUM CHLORIDE 0.9% FLUSH
10.0000 mL | INTRAVENOUS | Status: DC | PRN
  Administered 2022-11-10: 10 mL

## 2022-11-10 MED ORDER — SODIUM CHLORIDE 0.9 % IV SOLN: Freq: Once | INTRAVENOUS | Status: AC

## 2022-11-10 MED ORDER — SODIUM CHLORIDE 0.9 % IV SOLN
100.0000 mg/m2 | Freq: Once | INTRAVENOUS | Status: AC
  Administered 2022-11-10: 200 mg via INTRAVENOUS
  Filled 2022-11-10: qty 10

## 2022-11-10 MED ORDER — HEPARIN SOD (PORK) LOCK FLUSH 100 UNIT/ML IV SOLN
500.0000 [IU] | Freq: Once | INTRAVENOUS | Status: AC | PRN
  Administered 2022-11-10: 500 [IU]

## 2022-11-10 MED ORDER — SODIUM CHLORIDE 0.9 % IV SOLN
10.0000 mg | Freq: Once | INTRAVENOUS | Status: AC
  Administered 2022-11-10: 10 mg via INTRAVENOUS
  Filled 2022-11-10: qty 10

## 2022-11-10 MED FILL — Dexamethasone Sodium Phosphate Inj 100 MG/10ML: INTRAMUSCULAR | Qty: 1 | Status: AC

## 2022-11-10 NOTE — Patient Instructions (Signed)
[facility]  Discharge Instructions: Thank you for choosing [facility] to provide your oncology and hematology care.   If you have a lab appointment with the Cancer Center, please go directly to the Cancer Center and check in at the registration area.   Wear comfortable clothing and clothing appropriate for easy access to any Portacath or PICC line.   We strive to give you quality time with your provider. You may need to reschedule your appointment if you arrive late (15 or more minutes).  Arriving late affects you and other patients whose appointments are after yours.  Also, if you miss three or more appointments without notifying the office, you may be dismissed from the clinic at the provider's discretion.      For prescription refill requests, have your pharmacy contact our office and allow 72 hours for refills to be completed.    Today you received the following chemotherapy and/or immunotherapy agents; Etoposide      To help prevent nausea and vomiting after your treatment, we encourage you to take your nausea medication as directed.  BELOW ARE SYMPTOMS THAT SHOULD BE REPORTED IMMEDIATELY: *FEVER GREATER THAN 100.4 F (38 C) OR HIGHER *CHILLS OR SWEATING *NAUSEA AND VOMITING THAT IS NOT CONTROLLED WITH YOUR NAUSEA MEDICATION *UNUSUAL SHORTNESS OF BREATH *UNUSUAL BRUISING OR BLEEDING *URINARY PROBLEMS (pain or burning when urinating, or frequent urination) *BOWEL PROBLEMS (unusual diarrhea, constipation, pain near the anus) TENDERNESS IN MOUTH AND THROAT WITH OR WITHOUT PRESENCE OF ULCERS (sore throat, sores in mouth, or a toothache) UNUSUAL RASH, SWELLING OR PAIN  UNUSUAL VAGINAL DISCHARGE OR ITCHING   Items with * indicate a potential emergency and should be followed up as soon as possible or go to the Emergency Department if any problems should occur.  Please show the CHEMOTHERAPY ALERT CARD or IMMUNOTHERAPY ALERT CARD at  check-in to the Emergency Department and triage nurse.  Should you have questions after your visit or need to cancel or reschedule your appointment, please contact [facility]  Dept: [phone]  and follow the prompts.  Office hours are 8:00 a.m. to 4:30 p.m. Monday - Friday. Please note that voicemails left after 4:00 p.m. may not be returned until the following business day.  We are closed weekends and major holidays. You have access to a nurse at all times for urgent questions. Please call the main number to the clinic Dept: [phone] and follow the prompts.   For any non-urgent questions, you may also contact your provider using MyChart. We now offer e-Visits for anyone 18 and older to request care online for non-urgent symptoms. For details visit [email or web address].   Also download the MyChart app! Go to the app store, search "MyChart", open the app, select [place], and log in with your MyChart username and password.   

## 2022-11-11 ENCOUNTER — Other Ambulatory Visit: Payer: Self-pay | Admitting: Internal Medicine

## 2022-11-11 ENCOUNTER — Inpatient Hospital Stay: Payer: Medicare HMO

## 2022-11-11 VITALS — BP 148/62 | HR 72 | Temp 98.2°F | Resp 18

## 2022-11-11 DIAGNOSIS — Z5111 Encounter for antineoplastic chemotherapy: Secondary | ICD-10-CM | POA: Diagnosis not present

## 2022-11-11 DIAGNOSIS — Z79620 Long term (current) use of immunosuppressive biologic: Secondary | ICD-10-CM | POA: Diagnosis not present

## 2022-11-11 DIAGNOSIS — C7951 Secondary malignant neoplasm of bone: Secondary | ICD-10-CM | POA: Diagnosis not present

## 2022-11-11 DIAGNOSIS — C3431 Malignant neoplasm of lower lobe, right bronchus or lung: Secondary | ICD-10-CM | POA: Diagnosis not present

## 2022-11-11 DIAGNOSIS — Z5189 Encounter for other specified aftercare: Secondary | ICD-10-CM | POA: Diagnosis not present

## 2022-11-11 MED ORDER — SODIUM CHLORIDE 0.9 % IV SOLN: Freq: Once | INTRAVENOUS | Status: AC

## 2022-11-11 MED ORDER — SODIUM CHLORIDE 0.9 % IV SOLN
100.0000 mg/m2 | Freq: Once | INTRAVENOUS | Status: AC
  Administered 2022-11-11: 200 mg via INTRAVENOUS
  Filled 2022-11-11: qty 10

## 2022-11-11 MED ORDER — SODIUM CHLORIDE 0.9 % IV SOLN
10.0000 mg | Freq: Once | INTRAVENOUS | Status: AC
  Administered 2022-11-11: 10 mg via INTRAVENOUS
  Filled 2022-11-11: qty 10

## 2022-11-12 ENCOUNTER — Other Ambulatory Visit: Payer: Self-pay | Admitting: Internal Medicine

## 2022-11-12 DIAGNOSIS — E109 Type 1 diabetes mellitus without complications: Secondary | ICD-10-CM | POA: Diagnosis not present

## 2022-11-12 MED ORDER — OXYCODONE-ACETAMINOPHEN 5-325 MG PO TABS: 1.0000 | ORAL_TABLET | Freq: Three times a day (TID) | ORAL | 0 refills | Status: DC | PRN

## 2022-11-13 ENCOUNTER — Inpatient Hospital Stay: Payer: Medicare HMO

## 2022-11-13 ENCOUNTER — Other Ambulatory Visit: Payer: Self-pay | Admitting: Internal Medicine

## 2022-11-13 ENCOUNTER — Encounter: Payer: Self-pay | Admitting: Internal Medicine

## 2022-11-13 VITALS — BP 144/74 | HR 98 | Temp 97.7°F | Resp 18

## 2022-11-13 DIAGNOSIS — C3431 Malignant neoplasm of lower lobe, right bronchus or lung: Secondary | ICD-10-CM | POA: Diagnosis not present

## 2022-11-13 DIAGNOSIS — Z79620 Long term (current) use of immunosuppressive biologic: Secondary | ICD-10-CM | POA: Diagnosis not present

## 2022-11-13 DIAGNOSIS — Z5111 Encounter for antineoplastic chemotherapy: Secondary | ICD-10-CM | POA: Diagnosis not present

## 2022-11-13 DIAGNOSIS — Z5189 Encounter for other specified aftercare: Secondary | ICD-10-CM | POA: Diagnosis not present

## 2022-11-13 DIAGNOSIS — C7951 Secondary malignant neoplasm of bone: Secondary | ICD-10-CM | POA: Diagnosis not present

## 2022-11-13 MED ORDER — PEGFILGRASTIM-JMDB 6 MG/0.6ML ~~LOC~~ SOSY
6.0000 mg | PREFILLED_SYRINGE | Freq: Once | SUBCUTANEOUS | Status: AC
  Administered 2022-11-13: 6 mg via SUBCUTANEOUS

## 2022-11-16 ENCOUNTER — Inpatient Hospital Stay: Payer: Medicare HMO

## 2022-11-16 DIAGNOSIS — Z79620 Long term (current) use of immunosuppressive biologic: Secondary | ICD-10-CM | POA: Diagnosis not present

## 2022-11-16 DIAGNOSIS — Z5111 Encounter for antineoplastic chemotherapy: Secondary | ICD-10-CM | POA: Diagnosis not present

## 2022-11-16 DIAGNOSIS — Z5189 Encounter for other specified aftercare: Secondary | ICD-10-CM | POA: Diagnosis not present

## 2022-11-16 DIAGNOSIS — C3431 Malignant neoplasm of lower lobe, right bronchus or lung: Secondary | ICD-10-CM

## 2022-11-16 DIAGNOSIS — C7951 Secondary malignant neoplasm of bone: Secondary | ICD-10-CM | POA: Diagnosis not present

## 2022-11-16 DIAGNOSIS — Z95828 Presence of other vascular implants and grafts: Secondary | ICD-10-CM

## 2022-11-16 LAB — CBC WITH DIFFERENTIAL (CANCER CENTER ONLY)
Abs Immature Granulocytes: 0.49 10*3/uL — ABNORMAL HIGH (ref 0.00–0.07)
Basophils Absolute: 0.1 10*3/uL (ref 0.0–0.1)
Basophils Relative: 0 %
Eosinophils Absolute: 0.1 10*3/uL (ref 0.0–0.5)
Eosinophils Relative: 0 %
HCT: 29.9 % — ABNORMAL LOW (ref 39.0–52.0)
Hemoglobin: 10.4 g/dL — ABNORMAL LOW (ref 13.0–17.0)
Immature Granulocytes: 2 %
Lymphocytes Relative: 4 %
Lymphs Abs: 1.2 10*3/uL (ref 0.7–4.0)
MCH: 33 pg (ref 26.0–34.0)
MCHC: 34.8 g/dL (ref 30.0–36.0)
MCV: 94.9 fL (ref 80.0–100.0)
Monocytes Absolute: 0.9 10*3/uL (ref 0.1–1.0)
Monocytes Relative: 3 %
Neutro Abs: 30.7 10*3/uL — ABNORMAL HIGH (ref 1.7–7.7)
Neutrophils Relative %: 91 %
Platelet Count: 103 10*3/uL — ABNORMAL LOW (ref 150–400)
RBC: 3.15 MIL/uL — ABNORMAL LOW (ref 4.22–5.81)
RDW: 17.6 % — ABNORMAL HIGH (ref 11.5–15.5)
Smear Review: NORMAL
WBC Count: 33.5 10*3/uL — ABNORMAL HIGH (ref 4.0–10.5)
nRBC: 0 % (ref 0.0–0.2)

## 2022-11-16 LAB — CMP (CANCER CENTER ONLY)
ALT: 12 U/L (ref 0–44)
AST: 12 U/L — ABNORMAL LOW (ref 15–41)
Albumin: 3.8 g/dL (ref 3.5–5.0)
Alkaline Phosphatase: 141 U/L — ABNORMAL HIGH (ref 38–126)
Anion gap: 6 (ref 5–15)
BUN: 21 mg/dL (ref 8–23)
CO2: 26 mmol/L (ref 22–32)
Calcium: 9.1 mg/dL (ref 8.9–10.3)
Chloride: 103 mmol/L (ref 98–111)
Creatinine: 0.81 mg/dL (ref 0.61–1.24)
GFR, Estimated: >60 mL/min (ref >60)
Glucose, Bld: 361 mg/dL — ABNORMAL HIGH (ref 70–99)
Potassium: 4.4 mmol/L (ref 3.5–5.1)
Sodium: 135 mmol/L (ref 135–145)
Total Bilirubin: 0.9 mg/dL (ref 0.3–1.2)
Total Protein: 6.2 g/dL — ABNORMAL LOW (ref 6.5–8.1)

## 2022-11-16 MED ORDER — HEPARIN SOD (PORK) LOCK FLUSH 100 UNIT/ML IV SOLN
500.0000 [IU] | Freq: Once | INTRAVENOUS | Status: AC
  Administered 2022-11-16: 500 [IU]

## 2022-11-16 MED ORDER — SODIUM CHLORIDE 0.9% FLUSH
10.0000 mL | Freq: Once | INTRAVENOUS | Status: AC
  Administered 2022-11-16: 10 mL

## 2022-11-23 ENCOUNTER — Inpatient Hospital Stay: Payer: Medicare HMO

## 2022-11-23 VITALS — BP 156/74 | HR 69 | Temp 98.7°F | Resp 18

## 2022-11-23 DIAGNOSIS — Z5111 Encounter for antineoplastic chemotherapy: Secondary | ICD-10-CM | POA: Diagnosis not present

## 2022-11-23 DIAGNOSIS — C3431 Malignant neoplasm of lower lobe, right bronchus or lung: Secondary | ICD-10-CM

## 2022-11-23 DIAGNOSIS — Z79620 Long term (current) use of immunosuppressive biologic: Secondary | ICD-10-CM | POA: Diagnosis not present

## 2022-11-23 DIAGNOSIS — Z5189 Encounter for other specified aftercare: Secondary | ICD-10-CM | POA: Diagnosis not present

## 2022-11-23 DIAGNOSIS — C7951 Secondary malignant neoplasm of bone: Secondary | ICD-10-CM | POA: Diagnosis not present

## 2022-11-23 DIAGNOSIS — Z95828 Presence of other vascular implants and grafts: Secondary | ICD-10-CM

## 2022-11-23 LAB — CMP (CANCER CENTER ONLY)
ALT: 14 U/L (ref 0–44)
AST: 13 U/L — ABNORMAL LOW (ref 15–41)
Albumin: 4.1 g/dL (ref 3.5–5.0)
Alkaline Phosphatase: 131 U/L — ABNORMAL HIGH (ref 38–126)
Anion gap: 7 (ref 5–15)
BUN: 15 mg/dL (ref 8–23)
CO2: 27 mmol/L (ref 22–32)
Calcium: 9.8 mg/dL (ref 8.9–10.3)
Chloride: 101 mmol/L (ref 98–111)
Creatinine: 0.87 mg/dL (ref 0.61–1.24)
GFR, Estimated: >60 mL/min (ref >60)
Glucose, Bld: 258 mg/dL — ABNORMAL HIGH (ref 70–99)
Potassium: 4.1 mmol/L (ref 3.5–5.1)
Sodium: 135 mmol/L (ref 135–145)
Total Bilirubin: 0.5 mg/dL (ref 0.3–1.2)
Total Protein: 6.7 g/dL (ref 6.5–8.1)

## 2022-11-23 LAB — CBC WITH DIFFERENTIAL (CANCER CENTER ONLY)
Abs Immature Granulocytes: 0.2 10*3/uL — ABNORMAL HIGH (ref 0.00–0.07)
Basophils Absolute: 0.1 10*3/uL (ref 0.0–0.1)
Basophils Relative: 1 %
Eosinophils Absolute: 0.1 10*3/uL (ref 0.0–0.5)
Eosinophils Relative: 1 %
HCT: 33.5 % — ABNORMAL LOW (ref 39.0–52.0)
Hemoglobin: 11.6 g/dL — ABNORMAL LOW (ref 13.0–17.0)
Immature Granulocytes: 2 %
Lymphocytes Relative: 8 %
Lymphs Abs: 1.1 10*3/uL (ref 0.7–4.0)
MCH: 33 pg (ref 26.0–34.0)
MCHC: 34.6 g/dL (ref 30.0–36.0)
MCV: 95.4 fL (ref 80.0–100.0)
Monocytes Absolute: 0.9 10*3/uL (ref 0.1–1.0)
Monocytes Relative: 7 %
Neutro Abs: 10.2 10*3/uL — ABNORMAL HIGH (ref 1.7–7.7)
Neutrophils Relative %: 81 %
Platelet Count: 127 10*3/uL — ABNORMAL LOW (ref 150–400)
RBC: 3.51 MIL/uL — ABNORMAL LOW (ref 4.22–5.81)
RDW: 18.7 % — ABNORMAL HIGH (ref 11.5–15.5)
WBC Count: 12.6 10*3/uL — ABNORMAL HIGH (ref 4.0–10.5)
nRBC: 0 % (ref 0.0–0.2)

## 2022-11-23 MED ORDER — HEPARIN SOD (PORK) LOCK FLUSH 100 UNIT/ML IV SOLN
500.0000 [IU] | Freq: Once | INTRAVENOUS | Status: AC
  Administered 2022-11-23: 500 [IU]

## 2022-11-23 MED ORDER — SODIUM CHLORIDE 0.9% FLUSH
10.0000 mL | Freq: Once | INTRAVENOUS | Status: AC
  Administered 2022-11-23: 10 mL

## 2022-11-24 ENCOUNTER — Other Ambulatory Visit: Payer: Self-pay | Admitting: Internal Medicine

## 2022-11-24 ENCOUNTER — Telehealth: Payer: Self-pay | Admitting: Medical Oncology

## 2022-11-24 MED ORDER — OXYCODONE-ACETAMINOPHEN 5-325 MG PO TABS: 1.0000 | ORAL_TABLET | Freq: Three times a day (TID) | ORAL | 0 refills | Status: DC | PRN

## 2022-11-24 NOTE — Telephone Encounter (Signed)
Request refill for percocet.

## 2022-11-29 MED FILL — Fosaprepitant Dimeglumine For IV Infusion 150 MG (Base Eq): INTRAVENOUS | Qty: 5 | Status: AC

## 2022-11-29 MED FILL — Dexamethasone Sodium Phosphate Inj 100 MG/10ML: INTRAMUSCULAR | Qty: 1 | Status: AC

## 2022-11-30 ENCOUNTER — Other Ambulatory Visit: Payer: Self-pay

## 2022-11-30 ENCOUNTER — Inpatient Hospital Stay: Payer: Medicare HMO

## 2022-11-30 ENCOUNTER — Encounter: Payer: Self-pay | Admitting: Medical Oncology

## 2022-11-30 ENCOUNTER — Inpatient Hospital Stay (HOSPITAL_BASED_OUTPATIENT_CLINIC_OR_DEPARTMENT_OTHER): Payer: Medicare HMO | Admitting: Internal Medicine

## 2022-11-30 VITALS — BP 139/71 | HR 67 | Temp 97.5°F | Resp 13 | Wt 152.4 lb

## 2022-11-30 DIAGNOSIS — C7951 Secondary malignant neoplasm of bone: Secondary | ICD-10-CM | POA: Diagnosis not present

## 2022-11-30 DIAGNOSIS — C3490 Malignant neoplasm of unspecified part of unspecified bronchus or lung: Secondary | ICD-10-CM | POA: Diagnosis not present

## 2022-11-30 DIAGNOSIS — Z5189 Encounter for other specified aftercare: Secondary | ICD-10-CM | POA: Diagnosis not present

## 2022-11-30 DIAGNOSIS — C3431 Malignant neoplasm of lower lobe, right bronchus or lung: Secondary | ICD-10-CM

## 2022-11-30 DIAGNOSIS — Z95828 Presence of other vascular implants and grafts: Secondary | ICD-10-CM

## 2022-11-30 DIAGNOSIS — Z79620 Long term (current) use of immunosuppressive biologic: Secondary | ICD-10-CM | POA: Diagnosis not present

## 2022-11-30 DIAGNOSIS — Z5111 Encounter for antineoplastic chemotherapy: Secondary | ICD-10-CM | POA: Diagnosis not present

## 2022-11-30 LAB — CBC WITH DIFFERENTIAL (CANCER CENTER ONLY)
Abs Immature Granulocytes: 0.05 10*3/uL (ref 0.00–0.07)
Basophils Absolute: 0.1 10*3/uL (ref 0.0–0.1)
Basophils Relative: 1 %
Eosinophils Absolute: 0.1 10*3/uL (ref 0.0–0.5)
Eosinophils Relative: 1 %
HCT: 34.3 % — ABNORMAL LOW (ref 39.0–52.0)
Hemoglobin: 11.4 g/dL — ABNORMAL LOW (ref 13.0–17.0)
Immature Granulocytes: 1 %
Lymphocytes Relative: 9 %
Lymphs Abs: 0.7 10*3/uL (ref 0.7–4.0)
MCH: 32.7 pg (ref 26.0–34.0)
MCHC: 33.2 g/dL (ref 30.0–36.0)
MCV: 98.3 fL (ref 80.0–100.0)
Monocytes Absolute: 0.8 10*3/uL (ref 0.1–1.0)
Monocytes Relative: 10 %
Neutro Abs: 6.5 10*3/uL (ref 1.7–7.7)
Neutrophils Relative %: 78 %
Platelet Count: 195 10*3/uL (ref 150–400)
RBC: 3.49 MIL/uL — ABNORMAL LOW (ref 4.22–5.81)
RDW: 18.4 % — ABNORMAL HIGH (ref 11.5–15.5)
WBC Count: 8.1 10*3/uL (ref 4.0–10.5)
nRBC: 0 % (ref 0.0–0.2)

## 2022-11-30 LAB — CMP (CANCER CENTER ONLY)
ALT: 15 U/L (ref 0–44)
AST: 16 U/L (ref 15–41)
Albumin: 3.9 g/dL (ref 3.5–5.0)
Alkaline Phosphatase: 91 U/L (ref 38–126)
Anion gap: 5 (ref 5–15)
BUN: 14 mg/dL (ref 8–23)
CO2: 28 mmol/L (ref 22–32)
Calcium: 9.3 mg/dL (ref 8.9–10.3)
Chloride: 103 mmol/L (ref 98–111)
Creatinine: 0.88 mg/dL (ref 0.61–1.24)
GFR, Estimated: >60 mL/min (ref >60)
Glucose, Bld: 287 mg/dL — ABNORMAL HIGH (ref 70–99)
Potassium: 4.2 mmol/L (ref 3.5–5.1)
Sodium: 136 mmol/L (ref 135–145)
Total Bilirubin: 0.5 mg/dL (ref 0.3–1.2)
Total Protein: 6.2 g/dL — ABNORMAL LOW (ref 6.5–8.1)

## 2022-11-30 MED ORDER — HEPARIN SOD (PORK) LOCK FLUSH 100 UNIT/ML IV SOLN
500.0000 [IU] | Freq: Once | INTRAVENOUS | Status: AC | PRN
  Administered 2022-11-30: 500 [IU]

## 2022-11-30 MED ORDER — SODIUM CHLORIDE 0.9 % IV SOLN
100.0000 mg/m2 | Freq: Once | INTRAVENOUS | Status: AC
  Administered 2022-11-30: 200 mg via INTRAVENOUS
  Filled 2022-11-30: qty 10

## 2022-11-30 MED ORDER — FAMOTIDINE 20 MG IN NS 100 ML IVPB
20.0000 mg | Freq: Once | INTRAVENOUS | Status: AC
  Administered 2022-11-30: 20 mg via INTRAVENOUS
  Filled 2022-11-30: qty 100

## 2022-11-30 MED ORDER — SODIUM CHLORIDE 0.9 % IV SOLN
10.0000 mg | Freq: Once | INTRAVENOUS | Status: AC
  Administered 2022-11-30: 10 mg via INTRAVENOUS
  Filled 2022-11-30: qty 10

## 2022-11-30 MED ORDER — SODIUM CHLORIDE 0.9% FLUSH
10.0000 mL | INTRAVENOUS | Status: DC | PRN
  Administered 2022-11-30: 10 mL

## 2022-11-30 MED ORDER — SODIUM CHLORIDE 0.9 % IV SOLN
446.5000 mg | Freq: Once | INTRAVENOUS | Status: AC
  Administered 2022-11-30: 450 mg via INTRAVENOUS
  Filled 2022-11-30: qty 45

## 2022-11-30 MED ORDER — DIPHENHYDRAMINE HCL 25 MG PO CAPS
25.0000 mg | ORAL_CAPSULE | Freq: Once | ORAL | Status: AC
  Administered 2022-11-30: 25 mg via ORAL
  Filled 2022-11-30: qty 1

## 2022-11-30 MED ORDER — SODIUM CHLORIDE 0.9 % IV SOLN: Freq: Once | INTRAVENOUS | Status: AC

## 2022-11-30 MED ORDER — PALONOSETRON HCL INJECTION 0.25 MG/5ML
0.2500 mg | Freq: Once | INTRAVENOUS | Status: AC
  Administered 2022-11-30: 0.25 mg via INTRAVENOUS
  Filled 2022-11-30: qty 5

## 2022-11-30 MED ORDER — SODIUM CHLORIDE 0.9% FLUSH
10.0000 mL | Freq: Once | INTRAVENOUS | Status: AC
  Administered 2022-11-30: 10 mL

## 2022-11-30 MED ORDER — SODIUM CHLORIDE 0.9 % IV SOLN
150.0000 mg | Freq: Once | INTRAVENOUS | Status: AC
  Administered 2022-11-30: 150 mg via INTRAVENOUS
  Filled 2022-11-30: qty 150

## 2022-11-30 MED FILL — Dexamethasone Sodium Phosphate Inj 100 MG/10ML: INTRAMUSCULAR | Qty: 1 | Status: AC

## 2022-11-30 NOTE — Progress Notes (Unsigned)
Patient seen by Dr. Mohamed  Vitals are within treatment parameters.  Labs reviewed: and are within treatment parameters.  Per physician team, patient is ready for treatment and there are NO modifications to the treatment plan.  

## 2022-11-30 NOTE — Patient Instructions (Signed)
[facility]  Discharge Instructions: Thank you for choosing [facility] to provide your oncology and hematology care.   If you have a lab appointment with the Cancer Center, please go directly to the Cancer Center and check in at the registration area.   Wear comfortable clothing and clothing appropriate for easy access to any Portacath or PICC line.   We strive to give you quality time with your provider. You may need to reschedule your appointment if you arrive late (15 or more minutes).  Arriving late affects you and other patients whose appointments are after yours.  Also, if you miss three or more appointments without notifying the office, you may be dismissed from the clinic at the provider's discretion.      For prescription refill requests, have your pharmacy contact our office and allow 72 hours for refills to be completed.    Today you received the following chemotherapy and/or immunotherapy agents: Carboplatin, Etoposide.       To help prevent nausea and vomiting after your treatment, we encourage you to take your nausea medication as directed.  BELOW ARE SYMPTOMS THAT SHOULD BE REPORTED IMMEDIATELY: *FEVER GREATER THAN 100.4 F (38 C) OR HIGHER *CHILLS OR SWEATING *NAUSEA AND VOMITING THAT IS NOT CONTROLLED WITH YOUR NAUSEA MEDICATION *UNUSUAL SHORTNESS OF BREATH *UNUSUAL BRUISING OR BLEEDING *URINARY PROBLEMS (pain or burning when urinating, or frequent urination) *BOWEL PROBLEMS (unusual diarrhea, constipation, pain near the anus) TENDERNESS IN MOUTH AND THROAT WITH OR WITHOUT PRESENCE OF ULCERS (sore throat, sores in mouth, or a toothache) UNUSUAL RASH, SWELLING OR PAIN  UNUSUAL VAGINAL DISCHARGE OR ITCHING   Items with * indicate a potential emergency and should be followed up as soon as possible or go to the Emergency Department if any problems should occur.  Please show the CHEMOTHERAPY ALERT CARD or IMMUNOTHERAPY  ALERT CARD at check-in to the Emergency Department and triage nurse.  Should you have questions after your visit or need to cancel or reschedule your appointment, please contact [facility]  Dept: [phone]  and follow the prompts.  Office hours are 8:00 a.m. to 4:30 p.m. Monday - Friday. Please note that voicemails left after 4:00 p.m. may not be returned until the following business day.  We are closed weekends and major holidays. You have access to a nurse at all times for urgent questions. Please call the main number to the clinic Dept: [phone] and follow the prompts.   For any non-urgent questions, you may also contact your provider using MyChart. We now offer e-Visits for anyone 76 and older to request care online for non-urgent symptoms. For details visit [email or web address].   Also download the MyChart app! Go to the app store, search "MyChart", open the app, select [place], and log in with your MyChart username and password.

## 2022-11-30 NOTE — Progress Notes (Signed)
[facility] [phone]   [phone]  OFFICE PROGRESS NOTE  [name], DO [address] [place] [address] [place] [address]  DIAGNOSIS:  1) Extensive stage (T1c, N2, M1C) small cell lung cancer presented with right lower lobe lung nodule in addition to right hilar and mediastinal lymphadenopathy and bone metastasis involving thoracic spines as well as the right iliac crest diagnosed in July 2023. 2) immunotherapy mediated type 1 diabetes mellitus diagnosed in October 2023.  PRIOR THERAPY:  1) Systemic chemotherapy with carboplatin for AUC of 5 on day 1, etoposide 100 Mg/M2 on days 1, 2 and 3 with Imfinzi 1500 Mg IV on day 1 and Cosela 240 Mg/M2 on the days of the chemotherapy as well as Imfinzi 1500 Mg IV on day 1 every 3 weeks the first 4 cycles followed by maintenance treatment every 4 weeks starting from cycle #5.  First dose was giving 03/10/2022.  The patient status post 8 cycles.  Last dose was given on August 25, 2022.  Starting from cycle #5 the patient will be on maintenance treatment with Imfinzi 1500 Mg IV every 4 weeks.  This treatment was discontinued secondary to disease progression. 2) Palliative radiotherapy to the enlarging mediastinal lymphadenopathy under the care of Dr. Basilio Cairo.  CURRENT THERAPY: Systemic chemotherapy again with carboplatin for AUC of 5 on day 1 and etoposide 100 Mg/M2 on days 1, 2 and 3 with Neulasta support.  First dose September 28, 2022.  Status post 3 cycles.  INTERVAL HISTORY: Adam Jordan 77 y.o. male returns to the clinic today for follow-up visit.  The patient is feeling fine today with no concerning complaints except for intermittent right lower quadrant abdominal pain with right inguinal hernia.  He is followed by Dr. Carolynne Edouard but his surgery is on hold because of his current condition with the small cell lung cancer.  He denied having any significant chest pain, shortness of breath, cough or hemoptysis.  He  has no nausea, vomiting, diarrhea or constipation.  He has no headache or visual changes.  He has no fever or chills.  He is here today for evaluation before starting cycle #4 of his treatment.    MEDICAL HISTORY: Past Medical History:  Diagnosis Date   Anxiety    Arthritis    osteoarthrititis- knees and most joints.   COPD (chronic obstructive pulmonary disease) (HCC)    moderate -no regular use of inhalers- rare use of oxygen as sexual activity   Dyspnea    outside in hot weather and also with pollen   Elevated blood-pressure reading, without diagnosis of hypertension 07/31/2019   Encounter for antineoplastic chemotherapy 03/04/2022   Encounter for antineoplastic immunotherapy 03/04/2022   History of heart artery stent 11/04/2021   Hypercholesterolemia 11/05/2013   Hyperlipidemia    Hypertension    Malaise and fatigue 04/19/2013   Neuromuscular disorder (HCC)    feet   Non-recurrent unilateral inguinal hernia without obstruction or gangrene 07/21/2021   Paroxysmal atrial flutter (HCC) 01/15/2021   Port-A-Cath in place 03/31/2022   Routine history and physical examination of adult 11/05/2013   Shoulder joint pain 08/23/2013   Status post lumbar microdiscectomy 07/31/2019   Thoracoabdominal aneurysm [facility])    s/p FEVAR 4 Vessel TABME 02/19/20 Dr. Michael Boston    ALLERGIES:  is allergic to losartan, atorvastatin calcium, and pollen extract-tree extract [pollen extract].  MEDICATIONS:  Current Outpatient Medications  Medication Sig Dispense Refill   Alum Hydroxide-Mag Trisilicate (GAVISCON) 80-14.2  MG CHEW Chew 1 tablet by mouth daily as needed (heartburn).     apixaban (ELIQUIS) 5 MG TABS tablet Take 1 tablet by mouth twice daily 180 tablet 1   atorvastatin (LIPITOR) 20 MG tablet Take 20 mg by mouth at bedtime.     blood glucose meter kit and supplies Dispense based on patient and insurance preference. Use up to four times daily as directed. (FOR ICD-10 E10.9, E11.9). (Patient taking  differently: 1 each by Other route See admin instructions. Dispense based on patient and insurance preference. Use up to four times daily as directed. (FOR ICD-10 E10.9, E11.9).) 1 each 0   clonazePAM (KLONOPIN) 1 MG tablet Take 1 tablet (1 mg total) by mouth 2 (two) times daily as needed for anxiety. 60 tablet 5   Continuous Blood Gluc Sensor (DEXCOM G7 SENSOR) MISC 1 Device by Does not apply route as directed. 9 each 3   fluticasone (FLONASE) 50 MCG/ACT nasal spray Place 1 spray into both nostrils 2 (two) times daily as needed for allergies.     insulin aspart (NOVOLOG FLEXPEN) 100 UNIT/ML FlexPen Max daily 30 units (Patient taking differently: Inject 1 Units into the skin 3 (three) times daily with meals. Max daily 30 units) 15 mL 11   insulin glargine (LANTUS) 100 UNIT/ML Solostar Pen Inject 24 Units into the skin daily. 30 mL 3   Insulin Pen Needle 32G X 4 MM MISC 1 Device by Does not apply route in the morning, at noon, in the evening, and at bedtime. 400 each 3   Lancets (ONETOUCH DELICA PLUS LANCET33G) MISC Apply 1 each topically 4 (four) times daily.     lidocaine-prilocaine (EMLA) cream Apply to the Port-A-Cath site 30-60 minutes before chemotherapy (Patient taking differently: Apply 1 Application topically See admin instructions. Apply to the Port-A-Cath site 30-60 minutes before chemotherapy) 30 g 0   meloxicam (MOBIC) 15 MG tablet Take 15 mg by mouth daily as needed for pain.     Multiple Vitamin (MULTIVITAMIN WITH MINERALS) TABS tablet Take 1 tablet by mouth daily. 30 tablet 5   Naphazoline HCl (CLEAR EYES OP) Place 1 drop into both eyes daily.     nitroGLYCERIN (NITROSTAT) 0.4 MG SL tablet Place 1 tablet (0.4 mg total) under the tongue every 5 (five) minutes as needed for chest pain. 25 tablet 6   ONETOUCH ULTRA test strip 1 each by Other route 3 (three) times daily. for testing 300 each 3   oxyCODONE-acetaminophen (PERCOCET/ROXICET) 5-325 MG tablet Take 1 tablet by mouth every 8 (eight)  hours as needed for severe pain. 30 tablet 0   PROAIR HFA 108 (90 BASE) MCG/ACT inhaler Inhale 2 puffs into the lungs every 6 (six) hours as needed for wheezing or shortness of breath.   2   sildenafil (REVATIO) 20 MG tablet Take 60 mg by mouth daily as needed (ED).     Tiotropium Bromide-Olodaterol (STIOLTO RESPIMAT) 2.5-2.5 MCG/ACT AERS Inhale 2 puffs into the lungs daily. 4 g 0   traZODone (DESYREL) 50 MG tablet Take 50 mg by mouth at bedtime.     triamcinolone cream (KENALOG) 0.1 % Apply 1 Application topically daily.     No current facility-administered medications for this visit.    SURGICAL HISTORY:  Past Surgical History:  Procedure Laterality Date   ABDOMINAL AORTIC ANEURYSM REPAIR  02/06/2020   BRONCHIAL BIOPSY  02/23/2022   Procedure: BRONCHIAL BIOPSIES;  Surgeon: Leslye Peer, MD;  Location: [facility] ENDOSCOPY;  Service: Pulmonary;;   BRONCHIAL  BRUSHINGS  02/23/2022   Procedure: BRONCHIAL BRUSHINGS;  Surgeon: Leslye Peer, MD;  Location: [facility] ENDOSCOPY;  Service: Pulmonary;;   BRONCHIAL NEEDLE ASPIRATION BIOPSY  02/23/2022   Procedure: BRONCHIAL NEEDLE ASPIRATION BIOPSIES;  Surgeon: Leslye Peer, MD;  Location: [facility] ENDOSCOPY;  Service: Pulmonary;;   BROW LIFT Bilateral 12/11/2021   Procedure: UPPER LID BLEPHAROPLASTY;  Surgeon: Glenna Fellows, MD;  Location: [facility];  Service: Plastics;  Laterality: Bilateral;   CYST REMOVAL LEG Left 07/03/2021   Procedure: EXCISION CYST LEFT BUTTOCK;  Surgeon: Griselda Miner, MD;  Location: [facility] OR;  Service: General;  Laterality: Left;   EYE SURGERY Bilateral    cataract surgery   FINGER ARTHROPLASTY Left    left thumb-Dr. Amanda Pea   INGUINAL HERNIA REPAIR Left 07/03/2021   Procedure: LEFT INGUINAL HERNIA REPAIR WITH MESH;  Surgeon: Griselda Miner, MD;  Location: [facility] OR;  Service: General;  Laterality: Left;   INSERTION OF MESH N/A 07/03/2021   Procedure: INSERTION OF MESH X2;  Surgeon: Griselda Miner, MD;  Location: [facility] OR;   Service: General;  Laterality: N/A;   IR IMAGING GUIDED PORT INSERTION  03/12/2022   IR RADIOLOGIST EVAL & MGMT  10/25/2019   KNEE ARTHROSCOPY Left    KNEE SURGERY Left    Bakers cyst x2   SHOULDER ARTHROSCOPY Right    thumb surgery     TONSILLECTOMY     TOTAL KNEE ARTHROPLASTY Left 03/19/2016   Procedure: LEFT TOTAL KNEE ARTHROPLASTY;  Surgeon: Eugenia Mcalpine, MD;  Location: [facility] ORS;  Service: Orthopedics;  Laterality: Left;   TOTAL SHOULDER REPLACEMENT Right    UMBILICAL HERNIA REPAIR N/A 07/03/2021   Procedure: UMBILICAL HERNIA REPAIR WITH MESH;  Surgeon: Griselda Miner, MD;  Location: [facility] OR;  Service: General;  Laterality: N/A;   VASCULAR SURGERY     VASECTOMY     VIDEO BRONCHOSCOPY WITH ENDOBRONCHIAL ULTRASOUND Right 02/23/2022   Procedure: VIDEO BRONCHOSCOPY WITH ENDOBRONCHIAL ULTRASOUND;  Surgeon: Leslye Peer, MD;  Location: [facility] ENDOSCOPY;  Service: Pulmonary;  Laterality: Right;   VIDEO BRONCHOSCOPY WITH RADIAL ENDOBRONCHIAL ULTRASOUND  02/23/2022   Procedure: VIDEO BRONCHOSCOPY WITH RADIAL ENDOBRONCHIAL ULTRASOUND;  Surgeon: Leslye Peer, MD;  Location: [facility] ENDOSCOPY;  Service: Pulmonary;;    REVIEW OF SYSTEMS:  A comprehensive review of systems was negative except for: Constitutional: positive for fatigue Gastrointestinal: positive for abdominal pain   PHYSICAL EXAMINATION: General appearance: alert, cooperative, fatigued, and no distress Head: Normocephalic, without obvious abnormality, atraumatic Neck: no adenopathy, no JVD, supple, symmetrical, trachea midline, and thyroid not enlarged, symmetric, no tenderness/mass/nodules Lymph nodes: Cervical, supraclavicular, and axillary nodes normal. Resp: clear to auscultation bilaterally Back: symmetric, no curvature. ROM normal. No CVA tenderness. Cardio: regular rate and rhythm, S1, S2 normal, no murmur, click, rub or gallop GI: soft, non-tender; bowel sounds normal; no masses,  no organomegaly Extremities: extremities normal,  atraumatic, no cyanosis or edema  ECOG PERFORMANCE STATUS: 1 - Symptomatic but completely ambulatory  Blood pressure 139/71, pulse 67, temperature (!) 97.5 F (36.4 C), temperature source Temporal, resp. rate 13, weight 152 lb 6.4 oz (69.1 kg), SpO2 97 %.  LABORATORY DATA: Lab Results  Component Value Date   WBC 8.1 11/30/2022   HGB 11.4 (L) 11/30/2022   HCT 34.3 (L) 11/30/2022   MCV 98.3 11/30/2022   PLT 195 11/30/2022      Chemistry      Component Value Date/Time   NA 135 11/23/2022 1250   NA 138  02/03/2021 0944   K 4.1 11/23/2022 1250   CL 101 11/23/2022 1250   CO2 27 11/23/2022 1250   BUN 15 11/23/2022 1250   BUN 13 02/03/2021 0944   CREATININE 0.87 11/23/2022 1250      Component Value Date/Time   CALCIUM 9.8 11/23/2022 1250   ALKPHOS 131 (H) 11/23/2022 1250   AST 13 (L) 11/23/2022 1250   ALT 14 11/23/2022 1250   BILITOT 0.5 11/23/2022 1250       RADIOGRAPHIC STUDIES: No results found.  ASSESSMENT AND PLAN: This is a very pleasant 77 years old white male with extensive stage (T1c, N2, M1C) small cell lung cancer presented with right lower lobe lung nodule in addition to right hilar and mediastinal lymphadenopathy and bone metastasis involving thoracic spines as well as the right iliac crest diagnosed in July 2023. The patient started systemic chemotherapy with carboplatin for AUC of 5 on day 1, etoposide 100 Mg/M2 on days 1, 2 and 3 as well as Cosela 240 Mg/M2 on the days of the chemotherapy and Imfinzi 1500 Mg IV every 3 weeks with the induction treatment.  He is status post 8 cycles.  Starting from cycle #5 the patient will be on maintenance treatment with Imfinzi 1500 Mg IV every 4 weeks.  Last dose was given on August 25, 2022 discontinued secondary to disease progression. He is currently undergoing palliative radiotherapy to the enlarging mediastinal lymphadenopathy under the care of Dr. Basilio Cairo. The patient tolerated his previous course of systemic chemotherapy  as well as the palliative radiation fairly well except for the aching pain in the sternal area after the radiation. He had repeat CT scan of the chest, abdomen and pelvis performed recently.  I personally and independently reviewed the scan images and discussed the results with the patient today. His scan showed interval improvement in the mediastinal and right hilar adenopathy but unfortunately he has developed a new liver lesion worrisome for liver metastasis as well as developing left adrenal mass again worrisome for metastasis. He started systemic chemotherapy again with carboplatin for AUC of 5 on day 1 and etoposide 100 Mg/M2 on days 1, 2 and 3 with Neulasta support every 3 weeks for 4 cycles.  He is status post 3 cycles. The patient has been tolerating this treatment well except for the aching pain after the Neulasta injection. I recommended for him to proceed with cycle #4 today as planned. I will see him back for follow-up visit in 3 weeks for evaluation with repeat CT scan of the chest, abdomen and pelvis for restaging of his disease. For the immunotherapy mediated type 1 diabetes mellitus, he will continue his evaluation and treatment under Dr. Lonzo Cloud. The patient was advised to call immediately if he has any concerning symptoms in the interval. The patient voices understanding of current disease status and treatment options and is in agreement with the current care plan.  All questions were answered. The patient knows to call the clinic with any problems, questions or concerns. We can certainly see the patient much sooner if necessary.  The total time spent in the appointment was 20 minutes.  Disclaimer: This note was dictated with voice recognition software. Similar sounding words can inadvertently be transcribed and may not be corrected upon review.

## 2022-12-01 ENCOUNTER — Inpatient Hospital Stay: Payer: Medicare HMO | Attending: Internal Medicine

## 2022-12-01 VITALS — BP 132/72 | HR 72 | Temp 98.2°F | Resp 18

## 2022-12-01 DIAGNOSIS — C3431 Malignant neoplasm of lower lobe, right bronchus or lung: Secondary | ICD-10-CM | POA: Diagnosis not present

## 2022-12-01 DIAGNOSIS — Z5189 Encounter for other specified aftercare: Secondary | ICD-10-CM | POA: Insufficient documentation

## 2022-12-01 DIAGNOSIS — C7951 Secondary malignant neoplasm of bone: Secondary | ICD-10-CM | POA: Diagnosis not present

## 2022-12-01 DIAGNOSIS — Z5111 Encounter for antineoplastic chemotherapy: Secondary | ICD-10-CM | POA: Insufficient documentation

## 2022-12-01 MED ORDER — SODIUM CHLORIDE 0.9 % IV SOLN
10.0000 mg | Freq: Once | INTRAVENOUS | Status: AC
  Administered 2022-12-01: 10 mg via INTRAVENOUS
  Filled 2022-12-01: qty 10

## 2022-12-01 MED ORDER — SODIUM CHLORIDE 0.9 % IV SOLN
100.0000 mg/m2 | Freq: Once | INTRAVENOUS | Status: AC
  Administered 2022-12-01: 200 mg via INTRAVENOUS
  Filled 2022-12-01: qty 0.9

## 2022-12-01 MED ORDER — SODIUM CHLORIDE 0.9 % IV SOLN: Freq: Once | INTRAVENOUS | Status: AC

## 2022-12-01 MED FILL — Dexamethasone Sodium Phosphate Inj 100 MG/10ML: INTRAMUSCULAR | Qty: 1 | Status: AC

## 2022-12-02 ENCOUNTER — Other Ambulatory Visit: Payer: Self-pay

## 2022-12-02 ENCOUNTER — Inpatient Hospital Stay: Payer: Medicare HMO

## 2022-12-02 ENCOUNTER — Other Ambulatory Visit: Payer: Self-pay | Admitting: Internal Medicine

## 2022-12-02 VITALS — BP 142/64 | HR 69 | Temp 97.5°F | Resp 18

## 2022-12-02 DIAGNOSIS — C3431 Malignant neoplasm of lower lobe, right bronchus or lung: Secondary | ICD-10-CM

## 2022-12-02 DIAGNOSIS — Z5111 Encounter for antineoplastic chemotherapy: Secondary | ICD-10-CM | POA: Diagnosis not present

## 2022-12-02 DIAGNOSIS — C7951 Secondary malignant neoplasm of bone: Secondary | ICD-10-CM | POA: Diagnosis not present

## 2022-12-02 DIAGNOSIS — Z5189 Encounter for other specified aftercare: Secondary | ICD-10-CM | POA: Diagnosis not present

## 2022-12-02 MED ORDER — SODIUM CHLORIDE 0.9% FLUSH
10.0000 mL | INTRAVENOUS | Status: DC | PRN
  Administered 2022-12-02: 10 mL

## 2022-12-02 MED ORDER — SODIUM CHLORIDE 0.9 % IV SOLN
10.0000 mg | Freq: Once | INTRAVENOUS | Status: AC
  Administered 2022-12-02: 10 mg via INTRAVENOUS
  Filled 2022-12-02: qty 10

## 2022-12-02 MED ORDER — SODIUM CHLORIDE 0.9 % IV SOLN
100.0000 mg/m2 | Freq: Once | INTRAVENOUS | Status: AC
  Administered 2022-12-02: 200 mg via INTRAVENOUS
  Filled 2022-12-02: qty 10

## 2022-12-02 MED ORDER — HEPARIN SOD (PORK) LOCK FLUSH 100 UNIT/ML IV SOLN
500.0000 [IU] | Freq: Once | INTRAVENOUS | Status: AC | PRN
  Administered 2022-12-02: 500 [IU]

## 2022-12-02 MED ORDER — SODIUM CHLORIDE 0.9 % IV SOLN: Freq: Once | INTRAVENOUS | Status: AC

## 2022-12-03 ENCOUNTER — Other Ambulatory Visit: Payer: Self-pay | Admitting: Internal Medicine

## 2022-12-03 MED ORDER — OXYCODONE-ACETAMINOPHEN 5-325 MG PO TABS: 1.0000 | ORAL_TABLET | Freq: Three times a day (TID) | ORAL | 0 refills | Status: DC | PRN

## 2022-12-04 ENCOUNTER — Inpatient Hospital Stay: Payer: Medicare HMO

## 2022-12-04 ENCOUNTER — Other Ambulatory Visit: Payer: Self-pay

## 2022-12-04 VITALS — BP 134/72 | HR 84 | Temp 97.8°F | Resp 18

## 2022-12-04 DIAGNOSIS — C7951 Secondary malignant neoplasm of bone: Secondary | ICD-10-CM | POA: Diagnosis not present

## 2022-12-04 DIAGNOSIS — C3431 Malignant neoplasm of lower lobe, right bronchus or lung: Secondary | ICD-10-CM | POA: Diagnosis not present

## 2022-12-04 DIAGNOSIS — Z5111 Encounter for antineoplastic chemotherapy: Secondary | ICD-10-CM | POA: Diagnosis not present

## 2022-12-04 DIAGNOSIS — Z5189 Encounter for other specified aftercare: Secondary | ICD-10-CM | POA: Diagnosis not present

## 2022-12-04 MED ORDER — PEGFILGRASTIM-JMDB 6 MG/0.6ML ~~LOC~~ SOSY
6.0000 mg | PREFILLED_SYRINGE | Freq: Once | SUBCUTANEOUS | Status: AC
  Administered 2022-12-04: 6 mg via SUBCUTANEOUS

## 2022-12-07 ENCOUNTER — Other Ambulatory Visit: Payer: Self-pay

## 2022-12-07 ENCOUNTER — Inpatient Hospital Stay: Payer: Medicare HMO

## 2022-12-07 DIAGNOSIS — C3431 Malignant neoplasm of lower lobe, right bronchus or lung: Secondary | ICD-10-CM | POA: Diagnosis not present

## 2022-12-07 DIAGNOSIS — C7951 Secondary malignant neoplasm of bone: Secondary | ICD-10-CM | POA: Diagnosis not present

## 2022-12-07 DIAGNOSIS — Z95828 Presence of other vascular implants and grafts: Secondary | ICD-10-CM

## 2022-12-07 DIAGNOSIS — Z5189 Encounter for other specified aftercare: Secondary | ICD-10-CM | POA: Diagnosis not present

## 2022-12-07 DIAGNOSIS — Z5111 Encounter for antineoplastic chemotherapy: Secondary | ICD-10-CM | POA: Diagnosis not present

## 2022-12-07 LAB — CBC WITH DIFFERENTIAL (CANCER CENTER ONLY)
Abs Immature Granulocytes: 1.04 10*3/uL — ABNORMAL HIGH (ref 0.00–0.07)
Basophils Absolute: 0.2 10*3/uL — ABNORMAL HIGH (ref 0.0–0.1)
Basophils Relative: 1 %
Eosinophils Absolute: 0.1 10*3/uL (ref 0.0–0.5)
Eosinophils Relative: 0 %
HCT: 30.2 % — ABNORMAL LOW (ref 39.0–52.0)
Hemoglobin: 10.4 g/dL — ABNORMAL LOW (ref 13.0–17.0)
Immature Granulocytes: 4 %
Lymphocytes Relative: 4 %
Lymphs Abs: 1.2 10*3/uL (ref 0.7–4.0)
MCH: 34.1 pg — ABNORMAL HIGH (ref 26.0–34.0)
MCHC: 34.4 g/dL (ref 30.0–36.0)
MCV: 99 fL (ref 80.0–100.0)
Monocytes Absolute: 0.6 10*3/uL (ref 0.1–1.0)
Monocytes Relative: 2 %
Neutro Abs: 24.8 10*3/uL — ABNORMAL HIGH (ref 1.7–7.7)
Neutrophils Relative %: 89 %
Platelet Count: 83 10*3/uL — ABNORMAL LOW (ref 150–400)
RBC: 3.05 MIL/uL — ABNORMAL LOW (ref 4.22–5.81)
RDW: 16.8 % — ABNORMAL HIGH (ref 11.5–15.5)
Smear Review: NORMAL
WBC Count: 27.8 10*3/uL — ABNORMAL HIGH (ref 4.0–10.5)
nRBC: 0 % (ref 0.0–0.2)

## 2022-12-07 LAB — CMP (CANCER CENTER ONLY)
ALT: 13 U/L (ref 0–44)
AST: 12 U/L — ABNORMAL LOW (ref 15–41)
Albumin: 3.8 g/dL (ref 3.5–5.0)
Alkaline Phosphatase: 139 U/L — ABNORMAL HIGH (ref 38–126)
Anion gap: 5 (ref 5–15)
BUN: 18 mg/dL (ref 8–23)
CO2: 28 mmol/L (ref 22–32)
Calcium: 8.7 mg/dL — ABNORMAL LOW (ref 8.9–10.3)
Chloride: 103 mmol/L (ref 98–111)
Creatinine: 0.84 mg/dL (ref 0.61–1.24)
GFR, Estimated: >60 mL/min (ref >60)
Glucose, Bld: 299 mg/dL — ABNORMAL HIGH (ref 70–99)
Potassium: 4.5 mmol/L (ref 3.5–5.1)
Sodium: 136 mmol/L (ref 135–145)
Total Bilirubin: 0.5 mg/dL (ref 0.3–1.2)
Total Protein: 6 g/dL — ABNORMAL LOW (ref 6.5–8.1)

## 2022-12-07 MED ORDER — SODIUM CHLORIDE 0.9% FLUSH
10.0000 mL | Freq: Once | INTRAVENOUS | Status: AC
  Administered 2022-12-07: 10 mL

## 2022-12-07 MED ORDER — HEPARIN SOD (PORK) LOCK FLUSH 100 UNIT/ML IV SOLN
500.0000 [IU] | Freq: Once | INTRAVENOUS | Status: AC
  Administered 2022-12-07: 500 [IU]

## 2022-12-10 MED ORDER — PHENYLEPHRINE HCL-NACL 20-0.9 MG/250ML-% IV SOLN
INTRAVENOUS | Status: AC
  Filled 2022-12-10: qty 250

## 2022-12-13 ENCOUNTER — Other Ambulatory Visit: Payer: Self-pay | Admitting: Internal Medicine

## 2022-12-13 DIAGNOSIS — E109 Type 1 diabetes mellitus without complications: Secondary | ICD-10-CM | POA: Diagnosis not present

## 2022-12-13 MED ORDER — OXYCODONE-ACETAMINOPHEN 5-325 MG PO TABS: 1.0000 | ORAL_TABLET | Freq: Three times a day (TID) | ORAL | 0 refills | Status: DC | PRN

## 2022-12-18 NOTE — Progress Notes (Unsigned)
[facility] OFFICE PROGRESS NOTE  Adam Reichmann, Adam Jordan [address] [place] [address]  DIAGNOSIS:  1) Extensive stage (T1c, N2, M1C) small cell lung cancer presented with right lower lobe lung nodule in addition to right hilar and mediastinal lymphadenopathy and bone metastasis involving thoracic spines as well as the right iliac crest diagnosed in July 2023. 2) immunotherapy mediated type 1 diabetes mellitus diagnosed in October 2023.  PRIOR THERAPY: 1) Systemic chemotherapy with carboplatin for AUC of 5 on day 1, etoposide 100 Mg/M2 on days 1, 2 and 3 with Imfinzi 1500 Mg IV on day 1 and Cosela 240 Mg/M2 on the days of the chemotherapy as well as Imfinzi 1500 Mg IV on day 1 every 3 weeks the first 4 cycles followed by maintenance treatment every 4 weeks starting from cycle #5.  First dose was giving 03/10/2022.  The patient status post 8 cycles.  Last dose was given on August 25, 2022.  Starting from cycle #5 the patient will be on maintenance treatment with Imfinzi 1500 Mg IV every 4 weeks.  This treatment was discontinued secondary to disease progression. 2) Palliative radiotherapy to the enlarging mediastinal lymphadenopathy under the care of Dr. Basilio Cairo.  CURRENT THERAPY: 1) Systemic chemotherapy again with carboplatin for AUC of 5 on day 1 and etoposide 100 Mg/M2 on days 1, 2 and 3 with Neulasta support.  First dose September 28, 2022.  Status post 4 cycles.   INTERVAL HISTORY: Adam Jordan 77 y.o. male returns to the clinic for a follow up visit. The patient is feeling well today without any concerning complaints except her has discomfort over his inguinal hernia but he is not a surgical candidate at this time due to his small cell lung cancer. He has a brace around his abdomen to help.   The patient continues to tolerate treatment with chemotherapy well without any adverse effects except he feels like he has the flu for a few days with his neulasta  injection. He is taking Claritin. Denies any fever, chills, or night sweats.  He lost about 2 pounds since last being seen but he reports a good appetite.  Denies any chest pain, significant shortness of breath (only if it is humid outside), cough, or hemoptysis. Denies any nausea, vomiting, diarrhea, or constipation. Denies any headaches. He has some vision changes due to having a hard time seeing far away due to being overdue for an eye exam. He states his eye doctor retired and he was given a list of alternate practices to consider.  He was supposed to have a restaging CT scan but it is not scheduled until tomorrow. He is here for  evaluation prior to starting cycle # 5.    MEDICAL HISTORY: Past Medical History:  Diagnosis Date   Anxiety    Arthritis    osteoarthrititis- knees and most joints.   COPD (chronic obstructive pulmonary disease) (HCC)    moderate -no regular use of inhalers- rare use of oxygen as sexual activity   Dyspnea    outside in hot weather and also with pollen   Elevated blood-pressure reading, without diagnosis of hypertension 07/31/2019   Encounter for antineoplastic chemotherapy 03/04/2022   Encounter for antineoplastic immunotherapy 03/04/2022   History of heart artery stent 11/04/2021   Hypercholesterolemia 11/05/2013   Hyperlipidemia    Hypertension    Malaise and fatigue 04/19/2013   Neuromuscular disorder (HCC)    feet   Non-recurrent unilateral inguinal hernia without obstruction or  gangrene 07/21/2021   Paroxysmal atrial flutter (HCC) 01/15/2021   Port-A-Cath in place 03/31/2022   Routine history and physical examination of adult 11/05/2013   Shoulder joint pain 08/23/2013   Status post lumbar microdiscectomy 07/31/2019   Thoracoabdominal aneurysm [facility])    s/p FEVAR 4 Vessel TABME 02/19/20 Dr. Michael Boston    ALLERGIES:  is allergic to losartan, atorvastatin calcium, and pollen extract-tree extract [pollen extract].  MEDICATIONS:  Current Outpatient Medications   Medication Sig Dispense Refill   Alum Hydroxide-Mag Trisilicate (GAVISCON) 80-14.2 MG CHEW Chew 1 tablet by mouth daily as needed (heartburn).     apixaban (ELIQUIS) 5 MG TABS tablet Take 1 tablet by mouth twice daily 180 tablet 1   atorvastatin (LIPITOR) 20 MG tablet Take 20 mg by mouth at bedtime.     blood glucose meter kit and supplies Dispense based on patient and insurance preference. Use up to four times daily as directed. (FOR ICD-10 E10.9, E11.9). (Patient taking differently: 1 each by Other route See admin instructions. Dispense based on patient and insurance preference. Use up to four times daily as directed. (FOR ICD-10 E10.9, E11.9).) 1 each 0   clonazePAM (KLONOPIN) 1 MG tablet Take 1 tablet (1 mg total) by mouth 2 (two) times daily as needed for anxiety. 60 tablet 5   Continuous Blood Gluc Sensor (DEXCOM G7 SENSOR) MISC 1 Device by Does not apply route as directed. 9 each 3   fluticasone (FLONASE) 50 MCG/ACT nasal spray Place 1 spray into both nostrils 2 (two) times daily as needed for allergies.     insulin aspart (NOVOLOG FLEXPEN) 100 UNIT/ML FlexPen Max daily 30 units (Patient taking differently: Inject 1 Units into the skin 3 (three) times daily with meals. Max daily 30 units) 15 mL 11   insulin glargine (LANTUS) 100 UNIT/ML Solostar Pen Inject 24 Units into the skin daily. 30 mL 3   Insulin Pen Needle 32G X 4 MM MISC 1 Device by Does not apply route in the morning, at noon, in the evening, and at bedtime. 400 each 3   Lancets (ONETOUCH DELICA PLUS LANCET33G) MISC Apply 1 each topically 4 (four) times daily.     lidocaine-prilocaine (EMLA) cream Apply to the Port-A-Cath site 30-60 minutes before chemotherapy (Patient taking differently: Apply 1 Application topically See admin instructions. Apply to the Port-A-Cath site 30-60 minutes before chemotherapy) 30 g 0   meloxicam (MOBIC) 15 MG tablet Take 15 mg by mouth daily as needed for pain.     Multiple Vitamin (MULTIVITAMIN WITH  MINERALS) TABS tablet Take 1 tablet by mouth daily. 30 tablet 5   Naphazoline HCl (CLEAR EYES OP) Place 1 drop into both eyes daily.     nitroGLYCERIN (NITROSTAT) 0.4 MG SL tablet Place 1 tablet (0.4 mg total) under the tongue every 5 (five) minutes as needed for chest pain. 25 tablet 6   ONETOUCH ULTRA test strip 1 each by Other route 3 (three) times daily. for testing 300 each 3   oxyCODONE-acetaminophen (PERCOCET/ROXICET) 5-325 MG tablet Take 1 tablet by mouth every 8 (eight) hours as needed for severe pain. 30 tablet 0   PROAIR HFA 108 (90 BASE) MCG/ACT inhaler Inhale 2 puffs into the lungs every 6 (six) hours as needed for wheezing or shortness of breath.   2   sildenafil (REVATIO) 20 MG tablet Take 60 mg by mouth daily as needed (ED).     Tiotropium Bromide-Olodaterol (STIOLTO RESPIMAT) 2.5-2.5 MCG/ACT AERS Inhale 2 puffs into the lungs daily.  4 g 0   traZODone (DESYREL) 50 MG tablet Take 50 mg by mouth at bedtime.     triamcinolone cream (KENALOG) 0.1 % Apply 1 Application topically daily.     No current facility-administered medications for this visit.   Facility-Administered Medications Ordered in Other Visits  Medication Dose Route Frequency Provider Last Rate Last Admin   0.9 %  sodium chloride infusion   Intravenous Once Si Gaul, MD       CARBOplatin (PARAPLATIN) 450 mg in sodium chloride 0.9 % 250 mL chemo infusion  450 mg Intravenous Once Si Gaul, MD       dexamethasone (DECADRON) 10 mg in sodium chloride 0.9 % 50 mL IVPB  10 mg Intravenous Once Si Gaul, MD       diphenhydrAMINE (BENADRYL) capsule 25 mg  25 mg Oral Once Si Gaul, MD       etoposide (VEPESID) 200 mg in sodium chloride 0.9 % 500 mL chemo infusion  100 mg/m2 (Treatment Plan Recorded) Intravenous Once Si Gaul, MD       famotidine (PEPCID) IVPB 20 mg in NS 100 mL IVPB  20 mg Intravenous Once Si Gaul, MD       fosaprepitant (EMEND) 150 mg in sodium chloride 0.9 % 145  mL IVPB  150 mg Intravenous Once Si Gaul, MD       heparin lock flush 100 unit/mL  500 Units Intracatheter Once PRN Si Gaul, MD       palonosetron (ALOXI) injection 0.25 mg  0.25 mg Intravenous Once Si Gaul, MD       sodium chloride flush (NS) 0.9 % injection 10 mL  10 mL Intracatheter PRN Si Gaul, MD        SURGICAL HISTORY:  Past Surgical History:  Procedure Laterality Date   ABDOMINAL AORTIC ANEURYSM REPAIR  02/06/2020   BRONCHIAL BIOPSY  02/23/2022   Procedure: BRONCHIAL BIOPSIES;  Surgeon: Leslye Peer, MD;  Location: [facility] ENDOSCOPY;  Service: Pulmonary;;   BRONCHIAL BRUSHINGS  02/23/2022   Procedure: BRONCHIAL BRUSHINGS;  Surgeon: Leslye Peer, MD;  Location: [facility] ENDOSCOPY;  Service: Pulmonary;;   BRONCHIAL NEEDLE ASPIRATION BIOPSY  02/23/2022   Procedure: BRONCHIAL NEEDLE ASPIRATION BIOPSIES;  Surgeon: Leslye Peer, MD;  Location: [facility]OSCOPY;  Service: Pulmonary;;   BROW LIFT Bilateral 12/11/2021   Procedure: UPPER LID BLEPHAROPLASTY;  Surgeon: Glenna Fellows, MD;  Location: [facility];  Service: Plastics;  Laterality: Bilateral;   CYST REMOVAL LEG Left 07/03/2021   Procedure: EXCISION CYST LEFT BUTTOCK;  Surgeon: Griselda Miner, MD;  Location: [facility] OR;  Service: General;  Laterality: Left;   EYE SURGERY Bilateral    cataract surgery   FINGER ARTHROPLASTY Left    left thumb-Dr. Amanda Pea   INGUINAL HERNIA REPAIR Left 07/03/2021   Procedure: LEFT INGUINAL HERNIA REPAIR WITH MESH;  Surgeon: Griselda Miner, MD;  Location: [facility] OR;  Service: General;  Laterality: Left;   INSERTION OF MESH N/A 07/03/2021   Procedure: INSERTION OF MESH X2;  Surgeon: Griselda Miner, MD;  Location: [facility] OR;  Service: General;  Laterality: N/A;   IR IMAGING GUIDED PORT INSERTION  03/12/2022   IR RADIOLOGIST EVAL & MGMT  10/25/2019   KNEE ARTHROSCOPY Left    KNEE SURGERY Left    Bakers cyst x2   SHOULDER ARTHROSCOPY Right    thumb surgery     TONSILLECTOMY      TOTAL KNEE ARTHROPLASTY Left 03/19/2016   Procedure: LEFT TOTAL KNEE ARTHROPLASTY;  Surgeon: Eugenia Mcalpine, MD;  Location: [facility] ORS;  Service: Orthopedics;  Laterality: Left;   TOTAL SHOULDER REPLACEMENT Right    UMBILICAL HERNIA REPAIR N/A 07/03/2021   Procedure: UMBILICAL HERNIA REPAIR WITH MESH;  Surgeon: Griselda Miner, MD;  Location: [facility] OR;  Service: General;  Laterality: N/A;   VASCULAR SURGERY     VASECTOMY     VIDEO BRONCHOSCOPY WITH ENDOBRONCHIAL ULTRASOUND Right 02/23/2022   Procedure: VIDEO BRONCHOSCOPY WITH ENDOBRONCHIAL ULTRASOUND;  Surgeon: Leslye Peer, MD;  [facility];  Service: Pulmonary;  Laterality: Right;   VIDEO BRONCHOSCOPY WITH RADIAL ENDOBRONCHIAL ULTRASOUND  02/23/2022   Procedure: VIDEO BRONCHOSCOPY WITH RADIAL ENDOBRONCHIAL ULTRASOUND;  Surgeon: Leslye Peer, MD;  Location: [facility];  Service: Pulmonary;;    REVIEW OF SYSTEMS:   Review of Systems  Constitutional: Negative for appetite change, chills, fatigue, fever and unexpected weight change.  HENT: Negative for mouth sores, nosebleeds, sore throat and trouble swallowing.   Eyes: Negative for eye problems and icterus.  Respiratory: Negative for cough, hemoptysis, shortness of breath and wheezing.   Cardiovascular: Negative for chest pain and leg swelling.  Gastrointestinal: Positive for discomfort near inguinal hernia. Negative for abdominal pain, constipation, diarrhea, nausea and vomiting.  Genitourinary: Negative for bladder incontinence, difficulty urinating, dysuria, frequency and hematuria.   Musculoskeletal: Positive for back pain and knee pain.  Negative for gait problem, neck pain and neck stiffness.  Skin: Negative for itching and rash.  Neurological: Negative for dizziness, extremity weakness, gait problem, headaches, light-headedness and seizures.  Hematological: Negative for adenopathy. Does not bruise/bleed easily.  Psychiatric/Behavioral: Negative for confusion, depression  and sleep disturbance. The patient is not nervous/anxious.   PHYSICAL EXAMINATION:  Blood pressure 130/66, pulse 79, temperature (!) 97.3 F (36.3 C), temperature source Temporal, resp. rate 16, height 5\' 8"  (1.727 m), weight 149 lb 11.2 oz (67.9 kg), SpO2 98 %.  ECOG PERFORMANCE STATUS: 1  Physical Exam  Constitutional: Oriented to person, place, and time and well-developed, well-nourished, and in no distress.  HENT:  Head: Normocephalic and atraumatic.  Mouth/Throat: Oropharynx is clear and moist. No oropharyngeal exudate.  Eyes: Conjunctivae are normal. Right eye exhibits no discharge. Left eye exhibits no discharge. No scleral icterus.  Neck: Normal range of motion. Neck supple.  Cardiovascular: Normal rate, regular rhythm, normal heart sounds and intact distal pulses.   Pulmonary/Chest: Effort normal and breath sounds normal. No respiratory distress. No wheezes. No rales.  Abdominal: Soft. Bowel sounds are normal. Exhibits no distension and no mass. There is no tenderness.  Musculoskeletal: Normal range of motion. Exhibits no edema.  Lymphadenopathy:    No cervical adenopathy.  Neurological: Alert and oriented to person, place, and time. Exhibits normal muscle tone. Examined in the motorized wheelchair.  Skin: Skin is warm and dry. No rash noted. Not diaphoretic. No erythema. No pallor.  Psychiatric: Mood, memory and judgment normal.  Vitals reviewed.  LABORATORY DATA: Lab Results  Component Value Date   WBC 8.3 12/21/2022   HGB 11.4 (L) 12/21/2022   HCT 33.8 (L) 12/21/2022   MCV 99.7 12/21/2022   PLT 217 12/21/2022      Chemistry      Component Value Date/Time   NA 136 12/21/2022 0903   NA 138 02/03/2021 0944   K 4.2 12/21/2022 0903   CL 103 12/21/2022 0903   CO2 28 12/21/2022 0903   BUN 11 12/21/2022 0903   BUN 13 02/03/2021 0944   CREATININE 0.79 12/21/2022 1610  Component Value Date/Time   CALCIUM 9.1 12/21/2022 0903   ALKPHOS 92 12/21/2022 0903   AST  16 12/21/2022 0903   ALT 13 12/21/2022 0903   BILITOT 0.4 12/21/2022 0903       RADIOGRAPHIC STUDIES:  No results found.   ASSESSMENT/PLAN:  This is a very pleasant 77 year old Caucasian male with extensive stage (T1c, N2, M1C) small cell lung cancer presented with right lower lobe lung nodule in addition to right hilar and mediastinal lymphadenopathy and bone metastasis involving thoracic spines as well as the right iliac crest diagnosed in July 2023.    The patient started systemic chemotherapy with carboplatin for AUC of 5 on day 1, etoposide 100 Mg/M2 on days 1, 2 and 3 as well as Cosela 240 Mg/M2 on the days of the chemotherapy and Imfinzi 1500 Mg IV every 3 weeks with the induction treatment. He was status post 8 cycles. Starting from cycle #5 the patient was on maintenance treatment with Imfinzi 1500 Mg IV every 4 weeks. Last dose was given on August 25, 2022 discontinued secondary to disease progression.    He then underwent palliative radiotherapy to the enlarging mediastinal lymphadenopathy under the care of Dr. Basilio Cairo.    The patient tolerated his previous course of systemic chemotherapy as well as the palliative radiation fairly well except for the aching pain in the sternal area after the radiation.    He then had a repeat CT CAP that showed His scan showed interval improvement in the mediastinal and right hilar adenopathy but unfortunately he has developed a new liver lesion worrisome for liver metastasis as well as developing left adrenal mass again worrisome for metastasis.    He started systemic chemotherapy again with carboplatin for AUC of 5 on day 1 and etoposide 100 Mg/M2 on days 1, 2 and 3 with Neulasta support every 3 weeks for 4 cycles.  He is status post 4 cycles.   The patient has been tolerating this treatment well with no concerning adverse effects except for the fatigue and aching pain after the Neulasta injection.   His restaging CT scan is scheduled for  tomorrow. Labs were reviewed. He will proceed with cycle #5 today as scheduled. We will review his CT at his next appointment but will call him sooner if the scan shows concerning findings.   We will see him back for a follow up visit in 3 weeks for evaluation and repeat blood work before start cycle #6    For his allergies, the patient was encouraged to take antihistamine daily such as Claritin.  He will continue to follow with surgery for his hernia. They told him they would consider surgery but it had to be >30 after chemotherapy treatments.   The patient was advised to call immediately if he has any concerning symptoms in the interval. The patient voices understanding of current disease status and treatment options and is in agreement with the current care plan. All questions were answered. The patient knows to call the clinic with any problems, questions or concerns. We can certainly see the patient much sooner if necessary    No orders of the defined types were placed in this encounter.   The total time spent in the appointment was 20-29 minutes.  Siria Calandro L Sandrina Heaton, PA-C 12/21/22

## 2022-12-20 MED FILL — Dexamethasone Sodium Phosphate Inj 100 MG/10ML: INTRAMUSCULAR | Qty: 1 | Status: AC

## 2022-12-20 MED FILL — Fosaprepitant Dimeglumine For IV Infusion 150 MG (Base Eq): INTRAVENOUS | Qty: 5 | Status: AC

## 2022-12-21 ENCOUNTER — Inpatient Hospital Stay (HOSPITAL_BASED_OUTPATIENT_CLINIC_OR_DEPARTMENT_OTHER): Payer: Medicare HMO | Admitting: Physician Assistant

## 2022-12-21 ENCOUNTER — Ambulatory Visit: Payer: Medicare HMO | Admitting: Physician Assistant

## 2022-12-21 ENCOUNTER — Inpatient Hospital Stay: Payer: Medicare HMO

## 2022-12-21 VITALS — BP 130/66 | HR 79 | Temp 97.3°F | Resp 16 | Ht 68.0 in | Wt 149.7 lb

## 2022-12-21 DIAGNOSIS — Z5111 Encounter for antineoplastic chemotherapy: Secondary | ICD-10-CM

## 2022-12-21 DIAGNOSIS — C3431 Malignant neoplasm of lower lobe, right bronchus or lung: Secondary | ICD-10-CM | POA: Diagnosis not present

## 2022-12-21 DIAGNOSIS — Z5189 Encounter for other specified aftercare: Secondary | ICD-10-CM | POA: Diagnosis not present

## 2022-12-21 DIAGNOSIS — C7951 Secondary malignant neoplasm of bone: Secondary | ICD-10-CM | POA: Diagnosis not present

## 2022-12-21 DIAGNOSIS — Z95828 Presence of other vascular implants and grafts: Secondary | ICD-10-CM

## 2022-12-21 LAB — CMP (CANCER CENTER ONLY)
ALT: 13 U/L (ref 0–44)
AST: 16 U/L (ref 15–41)
Albumin: 4 g/dL (ref 3.5–5.0)
Alkaline Phosphatase: 92 U/L (ref 38–126)
Anion gap: 5 (ref 5–15)
BUN: 11 mg/dL (ref 8–23)
CO2: 28 mmol/L (ref 22–32)
Calcium: 9.1 mg/dL (ref 8.9–10.3)
Chloride: 103 mmol/L (ref 98–111)
Creatinine: 0.79 mg/dL (ref 0.61–1.24)
GFR, Estimated: >60 mL/min (ref >60)
Glucose, Bld: 325 mg/dL — ABNORMAL HIGH (ref 70–99)
Potassium: 4.2 mmol/L (ref 3.5–5.1)
Sodium: 136 mmol/L (ref 135–145)
Total Bilirubin: 0.4 mg/dL (ref 0.3–1.2)
Total Protein: 6.3 g/dL — ABNORMAL LOW (ref 6.5–8.1)

## 2022-12-21 LAB — CBC WITH DIFFERENTIAL (CANCER CENTER ONLY)
Abs Immature Granulocytes: 0.05 10*3/uL (ref 0.00–0.07)
Basophils Absolute: 0.1 10*3/uL (ref 0.0–0.1)
Basophils Relative: 1 %
Eosinophils Absolute: 0.1 10*3/uL (ref 0.0–0.5)
Eosinophils Relative: 1 %
HCT: 33.8 % — ABNORMAL LOW (ref 39.0–52.0)
Hemoglobin: 11.4 g/dL — ABNORMAL LOW (ref 13.0–17.0)
Immature Granulocytes: 1 %
Lymphocytes Relative: 10 %
Lymphs Abs: 0.9 10*3/uL (ref 0.7–4.0)
MCH: 33.6 pg (ref 26.0–34.0)
MCHC: 33.7 g/dL (ref 30.0–36.0)
MCV: 99.7 fL (ref 80.0–100.0)
Monocytes Absolute: 0.8 10*3/uL (ref 0.1–1.0)
Monocytes Relative: 10 %
Neutro Abs: 6.4 10*3/uL (ref 1.7–7.7)
Neutrophils Relative %: 77 %
Platelet Count: 217 10*3/uL (ref 150–400)
RBC: 3.39 MIL/uL — ABNORMAL LOW (ref 4.22–5.81)
RDW: 16.6 % — ABNORMAL HIGH (ref 11.5–15.5)
WBC Count: 8.3 10*3/uL (ref 4.0–10.5)
nRBC: 0 % (ref 0.0–0.2)

## 2022-12-21 MED ORDER — PALONOSETRON HCL INJECTION 0.25 MG/5ML
0.2500 mg | Freq: Once | INTRAVENOUS | Status: AC
  Administered 2022-12-21: 0.25 mg via INTRAVENOUS
  Filled 2022-12-21: qty 5

## 2022-12-21 MED ORDER — FAMOTIDINE 20 MG IN NS 100 ML IVPB
20.0000 mg | Freq: Once | INTRAVENOUS | Status: AC
  Administered 2022-12-21: 20 mg via INTRAVENOUS
  Filled 2022-12-21: qty 100

## 2022-12-21 MED ORDER — SODIUM CHLORIDE 0.9 % IV SOLN
10.0000 mg | Freq: Once | INTRAVENOUS | Status: AC
  Administered 2022-12-21: 10 mg via INTRAVENOUS
  Filled 2022-12-21: qty 10

## 2022-12-21 MED ORDER — SODIUM CHLORIDE 0.9 % IV SOLN
446.5000 mg | Freq: Once | INTRAVENOUS | Status: AC
  Administered 2022-12-21: 450 mg via INTRAVENOUS
  Filled 2022-12-21: qty 45

## 2022-12-21 MED ORDER — SODIUM CHLORIDE 0.9 % IV SOLN: Freq: Once | INTRAVENOUS | Status: AC

## 2022-12-21 MED ORDER — HEPARIN SOD (PORK) LOCK FLUSH 100 UNIT/ML IV SOLN
500.0000 [IU] | Freq: Once | INTRAVENOUS | Status: AC | PRN
  Administered 2022-12-21: 500 [IU]

## 2022-12-21 MED ORDER — SODIUM CHLORIDE 0.9 % IV SOLN
150.0000 mg | Freq: Once | INTRAVENOUS | Status: AC
  Administered 2022-12-21: 150 mg via INTRAVENOUS
  Filled 2022-12-21: qty 150

## 2022-12-21 MED ORDER — DIPHENHYDRAMINE HCL 25 MG PO CAPS
25.0000 mg | ORAL_CAPSULE | Freq: Once | ORAL | Status: AC
  Administered 2022-12-21: 25 mg via ORAL
  Filled 2022-12-21: qty 1

## 2022-12-21 MED ORDER — SODIUM CHLORIDE 0.9% FLUSH
10.0000 mL | INTRAVENOUS | Status: DC | PRN
  Administered 2022-12-21: 10 mL

## 2022-12-21 MED ORDER — SODIUM CHLORIDE 0.9% FLUSH
10.0000 mL | Freq: Once | INTRAVENOUS | Status: AC
  Administered 2022-12-21: 10 mL

## 2022-12-21 MED ORDER — SODIUM CHLORIDE 0.9 % IV SOLN
97.0000 mg/m2 | Freq: Once | INTRAVENOUS | Status: AC
  Administered 2022-12-21: 180 mg via INTRAVENOUS
  Filled 2022-12-21: qty 9

## 2022-12-21 MED FILL — Dexamethasone Sodium Phosphate Inj 100 MG/10ML: INTRAMUSCULAR | Qty: 1 | Status: AC

## 2022-12-21 NOTE — Patient Instructions (Signed)
[facility]  Discharge Instructions: Thank you for choosing [facility] to provide your oncology and hematology care.   If you have a lab appointment with the Cancer Center, please go directly to the Cancer Center and check in at the registration area.   Wear comfortable clothing and clothing appropriate for easy access to any Portacath or PICC line.   We strive to give you quality time with your provider. You may need to reschedule your appointment if you arrive late (15 or more minutes).  Arriving late affects you and other patients whose appointments are after yours.  Also, if you miss three or more appointments without notifying the office, you may be dismissed from the clinic at the provider's discretion.      For prescription refill requests, have your pharmacy contact our office and allow 72 hours for refills to be completed.    Today you received the following chemotherapy and/or immunotherapy agents: Carboplatin and Etoposide      To help prevent nausea and vomiting after your treatment, we encourage you to take your nausea medication as directed.  BELOW ARE SYMPTOMS THAT SHOULD BE REPORTED IMMEDIATELY: *FEVER GREATER THAN 100.4 F (38 C) OR HIGHER *CHILLS OR SWEATING *NAUSEA AND VOMITING THAT IS NOT CONTROLLED WITH YOUR NAUSEA MEDICATION *UNUSUAL SHORTNESS OF BREATH *UNUSUAL BRUISING OR BLEEDING *URINARY PROBLEMS (pain or burning when urinating, or frequent urination) *BOWEL PROBLEMS (unusual diarrhea, constipation, pain near the anus) TENDERNESS IN MOUTH AND THROAT WITH OR WITHOUT PRESENCE OF ULCERS (sore throat, sores in mouth, or a toothache) UNUSUAL RASH, SWELLING OR PAIN  UNUSUAL VAGINAL DISCHARGE OR ITCHING   Items with * indicate a potential emergency and should be followed up as soon as possible or go to the Emergency Department if any problems should occur.  Please show the CHEMOTHERAPY ALERT CARD or IMMUNOTHERAPY  ALERT CARD at check-in to the Emergency Department and triage nurse.  Should you have questions after your visit or need to cancel or reschedule your appointment, please contact [facility]  Dept: [phone]  and follow the prompts.  Office hours are 8:00 a.m. to 4:30 p.m. Monday - Friday. Please note that voicemails left after 4:00 p.m. may not be returned until the following business day.  We are closed weekends and major holidays. You have access to a nurse at all times for urgent questions. Please call the main number to the clinic Dept: [phone] and follow the prompts.   For any non-urgent questions, you may also contact your provider using MyChart. We now offer e-Visits for anyone 18 and older to request care online for non-urgent symptoms. For details visit [email or web address].   Also download the MyChart app! Go to the app store, search "MyChart", open the app, select [place], and log in with your MyChart username and password.   

## 2022-12-21 NOTE — Progress Notes (Addendum)
Per Cassie, PA, we will decrease etoposide dose to 180mg  due to weight loss. (EPIC dose = 200mg ; difference of 10%). We will keep this dose all week.   Renaee Munda, PharmD PGY-2 Pharmacy Resident Hematology/Oncology [phone]  12/21/2022 10:29 AM

## 2022-12-22 ENCOUNTER — Inpatient Hospital Stay: Payer: Medicare HMO

## 2022-12-22 ENCOUNTER — Ambulatory Visit (HOSPITAL_COMMUNITY)
Admission: RE | Admit: 2022-12-22 | Discharge: 2022-12-22 | Disposition: A | Discharge status: Home / Self Care | Payer: Medicare HMO | Source: Ambulatory Visit | Attending: Internal Medicine | Admitting: Internal Medicine

## 2022-12-22 ENCOUNTER — Other Ambulatory Visit: Payer: Self-pay | Admitting: Internal Medicine

## 2022-12-22 VITALS — BP 149/70 | HR 72 | Temp 98.6°F | Resp 18

## 2022-12-22 DIAGNOSIS — Z5189 Encounter for other specified aftercare: Secondary | ICD-10-CM | POA: Diagnosis not present

## 2022-12-22 DIAGNOSIS — C3490 Malignant neoplasm of unspecified part of unspecified bronchus or lung: Secondary | ICD-10-CM | POA: Diagnosis not present

## 2022-12-22 DIAGNOSIS — Z5111 Encounter for antineoplastic chemotherapy: Secondary | ICD-10-CM | POA: Diagnosis not present

## 2022-12-22 DIAGNOSIS — C3431 Malignant neoplasm of lower lobe, right bronchus or lung: Secondary | ICD-10-CM

## 2022-12-22 DIAGNOSIS — R918 Other nonspecific abnormal finding of lung field: Secondary | ICD-10-CM | POA: Diagnosis not present

## 2022-12-22 DIAGNOSIS — J432 Centrilobular emphysema: Secondary | ICD-10-CM | POA: Diagnosis not present

## 2022-12-22 DIAGNOSIS — N200 Calculus of kidney: Secondary | ICD-10-CM | POA: Diagnosis not present

## 2022-12-22 DIAGNOSIS — C7951 Secondary malignant neoplasm of bone: Secondary | ICD-10-CM | POA: Diagnosis not present

## 2022-12-22 MED ORDER — SODIUM CHLORIDE 0.9 % IV SOLN: Freq: Once | INTRAVENOUS | Status: AC

## 2022-12-22 MED ORDER — SODIUM CHLORIDE (PF) 0.9 % IJ SOLN
INTRAMUSCULAR | Status: AC
  Filled 2022-12-22: qty 50

## 2022-12-22 MED ORDER — OXYCODONE-ACETAMINOPHEN 5-325 MG PO TABS: 1.0000 | ORAL_TABLET | Freq: Three times a day (TID) | ORAL | 0 refills | Status: DC | PRN

## 2022-12-22 MED ORDER — IOHEXOL 300 MG/ML  SOLN
100.0000 mL | Freq: Once | INTRAMUSCULAR | Status: AC | PRN
  Administered 2022-12-22: 100 mL via INTRAVENOUS

## 2022-12-22 MED ORDER — SODIUM CHLORIDE 0.9 % IV SOLN
97.0000 mg/m2 | Freq: Once | INTRAVENOUS | Status: AC
  Administered 2022-12-22: 180 mg via INTRAVENOUS
  Filled 2022-12-22: qty 9

## 2022-12-22 MED ORDER — HEPARIN SOD (PORK) LOCK FLUSH 100 UNIT/ML IV SOLN
500.0000 [IU] | Freq: Once | INTRAVENOUS | Status: AC
  Administered 2022-12-22: 500 [IU] via INTRAVENOUS

## 2022-12-22 MED ORDER — SODIUM CHLORIDE 0.9% FLUSH
10.0000 mL | INTRAVENOUS | Status: DC | PRN
  Administered 2022-12-22: 10 mL

## 2022-12-22 MED ORDER — HEPARIN SOD (PORK) LOCK FLUSH 100 UNIT/ML IV SOLN
500.0000 [IU] | Freq: Once | INTRAVENOUS | Status: AC | PRN
  Administered 2022-12-22: 500 [IU]

## 2022-12-22 MED ORDER — SODIUM CHLORIDE 0.9 % IV SOLN
10.0000 mg | Freq: Once | INTRAVENOUS | Status: AC
  Administered 2022-12-22: 10 mg via INTRAVENOUS
  Filled 2022-12-22: qty 10

## 2022-12-22 MED FILL — Dexamethasone Sodium Phosphate Inj 100 MG/10ML: INTRAMUSCULAR | Qty: 1 | Status: AC

## 2022-12-22 NOTE — Patient Instructions (Signed)
[facility]  Discharge Instructions: Thank you for choosing [facility] to provide your oncology and hematology care.   If you have a lab appointment with the Cancer Center, please go directly to the Cancer Center and check in at the registration area.   Wear comfortable clothing and clothing appropriate for easy access to any Portacath or PICC line.   We strive to give you quality time with your provider. You may need to reschedule your appointment if you arrive late (15 or more minutes).  Arriving late affects you and other patients whose appointments are after yours.  Also, if you miss three or more appointments without notifying the office, you may be dismissed from the clinic at the provider's discretion.      For prescription refill requests, have your pharmacy contact our office and allow 72 hours for refills to be completed.    Today you received the following chemotherapy and/or immunotherapy agents; Etoposide      To help prevent nausea and vomiting after your treatment, we encourage you to take your nausea medication as directed.  BELOW ARE SYMPTOMS THAT SHOULD BE REPORTED IMMEDIATELY: *FEVER GREATER THAN 100.4 F (38 C) OR HIGHER *CHILLS OR SWEATING *NAUSEA AND VOMITING THAT IS NOT CONTROLLED WITH YOUR NAUSEA MEDICATION *UNUSUAL SHORTNESS OF BREATH *UNUSUAL BRUISING OR BLEEDING *URINARY PROBLEMS (pain or burning when urinating, or frequent urination) *BOWEL PROBLEMS (unusual diarrhea, constipation, pain near the anus) TENDERNESS IN MOUTH AND THROAT WITH OR WITHOUT PRESENCE OF ULCERS (sore throat, sores in mouth, or a toothache) UNUSUAL RASH, SWELLING OR PAIN  UNUSUAL VAGINAL DISCHARGE OR ITCHING   Items with * indicate a potential emergency and should be followed up as soon as possible or go to the Emergency Department if any problems should occur.  Please show the CHEMOTHERAPY ALERT CARD or IMMUNOTHERAPY ALERT CARD at  check-in to the Emergency Department and triage nurse.  Should you have questions after your visit or need to cancel or reschedule your appointment, please contact [facility]  Dept: [phone]  and follow the prompts.  Office hours are 8:00 a.m. to 4:30 p.m. Monday - Friday. Please note that voicemails left after 4:00 p.m. may not be returned until the following business day.  We are closed weekends and major holidays. You have access to a nurse at all times for urgent questions. Please call the main number to the clinic Dept: [phone] and follow the prompts.   For any non-urgent questions, you may also contact your provider using MyChart. We now offer e-Visits for anyone 18 and older to request care online for non-urgent symptoms. For details visit [email or web address].   Also download the MyChart app! Go to the app store, search "MyChart", open the app, select [place], and log in with your MyChart username and password.   

## 2022-12-23 ENCOUNTER — Inpatient Hospital Stay: Payer: Medicare HMO

## 2022-12-23 ENCOUNTER — Other Ambulatory Visit: Payer: Self-pay

## 2022-12-23 ENCOUNTER — Encounter: Payer: Self-pay | Admitting: Internal Medicine

## 2022-12-23 VITALS — BP 152/67 | HR 78 | Temp 98.1°F | Resp 16

## 2022-12-23 DIAGNOSIS — C7951 Secondary malignant neoplasm of bone: Secondary | ICD-10-CM | POA: Diagnosis not present

## 2022-12-23 DIAGNOSIS — C3431 Malignant neoplasm of lower lobe, right bronchus or lung: Secondary | ICD-10-CM | POA: Diagnosis not present

## 2022-12-23 DIAGNOSIS — Z5189 Encounter for other specified aftercare: Secondary | ICD-10-CM | POA: Diagnosis not present

## 2022-12-23 DIAGNOSIS — Z5111 Encounter for antineoplastic chemotherapy: Secondary | ICD-10-CM | POA: Diagnosis not present

## 2022-12-23 MED ORDER — OXYCODONE-ACETAMINOPHEN 5-325 MG PO TABS: 1.0000 | ORAL_TABLET | Freq: Three times a day (TID) | ORAL | 0 refills | Status: DC | PRN

## 2022-12-23 MED ORDER — SODIUM CHLORIDE 0.9% FLUSH
10.0000 mL | INTRAVENOUS | Status: DC | PRN
  Administered 2022-12-23: 10 mL

## 2022-12-23 MED ORDER — SODIUM CHLORIDE 0.9 % IV SOLN: Freq: Once | INTRAVENOUS | Status: AC

## 2022-12-23 MED ORDER — SODIUM CHLORIDE 0.9 % IV SOLN
10.0000 mg | Freq: Once | INTRAVENOUS | Status: AC
  Administered 2022-12-23: 10 mg via INTRAVENOUS
  Filled 2022-12-23: qty 10

## 2022-12-23 MED ORDER — SODIUM CHLORIDE 0.9 % IV SOLN
97.0000 mg/m2 | Freq: Once | INTRAVENOUS | Status: AC
  Administered 2022-12-23: 180 mg via INTRAVENOUS
  Filled 2022-12-23: qty 9

## 2022-12-23 MED ORDER — HEPARIN SOD (PORK) LOCK FLUSH 100 UNIT/ML IV SOLN
500.0000 [IU] | Freq: Once | INTRAVENOUS | Status: AC | PRN
  Administered 2022-12-23: 500 [IU]

## 2022-12-23 NOTE — Patient Instructions (Signed)
[facility]  Discharge Instructions: Thank you for choosing [facility] to provide your oncology and hematology care.   If you have a lab appointment with the Cancer Center, please go directly to the Cancer Center and check in at the registration area.   Wear comfortable clothing and clothing appropriate for easy access to any Portacath or PICC line.   We strive to give you quality time with your provider. You may need to reschedule your appointment if you arrive late (15 or more minutes).  Arriving late affects you and other patients whose appointments are after yours.  Also, if you miss three or more appointments without notifying the office, you may be dismissed from the clinic at the provider's discretion.      For prescription refill requests, have your pharmacy contact our office and allow 72 hours for refills to be completed.    Today you received the following chemotherapy and/or immunotherapy agents; Etoposide      To help prevent nausea and vomiting after your treatment, we encourage you to take your nausea medication as directed.  BELOW ARE SYMPTOMS THAT SHOULD BE REPORTED IMMEDIATELY: *FEVER GREATER THAN 100.4 F (38 C) OR HIGHER *CHILLS OR SWEATING *NAUSEA AND VOMITING THAT IS NOT CONTROLLED WITH YOUR NAUSEA MEDICATION *UNUSUAL SHORTNESS OF BREATH *UNUSUAL BRUISING OR BLEEDING *URINARY PROBLEMS (pain or burning when urinating, or frequent urination) *BOWEL PROBLEMS (unusual diarrhea, constipation, pain near the anus) TENDERNESS IN MOUTH AND THROAT WITH OR WITHOUT PRESENCE OF ULCERS (sore throat, sores in mouth, or a toothache) UNUSUAL RASH, SWELLING OR PAIN  UNUSUAL VAGINAL DISCHARGE OR ITCHING   Items with * indicate a potential emergency and should be followed up as soon as possible or go to the Emergency Department if any problems should occur.  Please show the CHEMOTHERAPY ALERT CARD or IMMUNOTHERAPY ALERT CARD at  check-in to the Emergency Department and triage nurse.  Should you have questions after your visit or need to cancel or reschedule your appointment, please contact [facility]  Dept: [phone]  and follow the prompts.  Office hours are 8:00 a.m. to 4:30 p.m. Monday - Friday. Please note that voicemails left after 4:00 p.m. may not be returned until the following business day.  We are closed weekends and major holidays. You have access to a nurse at all times for urgent questions. Please call the main number to the clinic Dept: [phone] and follow the prompts.   For any non-urgent questions, you may also contact your provider using MyChart. We now offer e-Visits for anyone 18 and older to request care online for non-urgent symptoms. For details visit [email or web address].   Also download the MyChart app! Go to the app store, search "MyChart", open the app, select [facility], and log in with your MyChart username and password.   

## 2022-12-25 ENCOUNTER — Inpatient Hospital Stay: Payer: Medicare HMO

## 2022-12-25 VITALS — BP 122/68 | HR 78 | Temp 97.9°F | Resp 17

## 2022-12-25 DIAGNOSIS — Z5189 Encounter for other specified aftercare: Secondary | ICD-10-CM | POA: Diagnosis not present

## 2022-12-25 DIAGNOSIS — C3431 Malignant neoplasm of lower lobe, right bronchus or lung: Secondary | ICD-10-CM

## 2022-12-25 DIAGNOSIS — C7951 Secondary malignant neoplasm of bone: Secondary | ICD-10-CM | POA: Diagnosis not present

## 2022-12-25 DIAGNOSIS — Z5111 Encounter for antineoplastic chemotherapy: Secondary | ICD-10-CM | POA: Diagnosis not present

## 2022-12-25 MED ORDER — PEGFILGRASTIM-JMDB 6 MG/0.6ML ~~LOC~~ SOSY
6.0000 mg | PREFILLED_SYRINGE | Freq: Once | SUBCUTANEOUS | Status: AC
  Administered 2022-12-25: 6 mg via SUBCUTANEOUS

## 2022-12-29 ENCOUNTER — Telehealth: Payer: Self-pay | Admitting: Physician Assistant

## 2022-12-29 NOTE — Telephone Encounter (Signed)
Scheduled per 05/28 scheduling message, called and left a voicemail.

## 2022-12-30 ENCOUNTER — Other Ambulatory Visit: Payer: Self-pay | Admitting: Internal Medicine

## 2022-12-31 ENCOUNTER — Other Ambulatory Visit: Payer: Self-pay | Admitting: Physician Assistant

## 2022-12-31 DIAGNOSIS — G893 Neoplasm related pain (acute) (chronic): Secondary | ICD-10-CM

## 2022-12-31 MED ORDER — OXYCODONE-ACETAMINOPHEN 5-325 MG PO TABS
1.0000 | ORAL_TABLET | Freq: Four times a day (QID) | ORAL | 0 refills | Status: DC | PRN
Start: 2022-12-31 — End: 2023-01-07

## 2022-12-31 NOTE — Progress Notes (Unsigned)
[facility] OFFICE PROGRESS NOTE  [name], DO [address] [place] [address] [place] [address]  DIAGNOSIS:  1) Extensive stage (T1c, N2, M1C) small cell lung cancer presented with right lower lobe lung nodule in addition to right hilar and mediastinal lymphadenopathy and bone metastasis involving thoracic spines as well as the right iliac crest diagnosed in July 2023. 2) immunotherapy mediated type 1 diabetes mellitus diagnosed in October 2023.  PRIOR THERAPY: 1) Systemic chemotherapy with carboplatin for AUC of 5 on day 1, etoposide 100 Mg/M2 on days 1, 2 and 3 with Imfinzi 1500 Mg IV on day 1 and Cosela 240 Mg/M2 on the days of the chemotherapy as well as Imfinzi 1500 Mg IV on day 1 every 3 weeks the first 4 cycles followed by maintenance treatment every 4 weeks starting from cycle #5.  First dose was giving 03/10/2022.  The patient status post 8 cycles.  Last dose was given on August 25, 2022.  Starting from cycle #5 the patient will be on maintenance treatment with Imfinzi 1500 Mg IV every 4 weeks.  This treatment was discontinued secondary to disease progression. 2) Palliative radiotherapy to the enlarging mediastinal lymphadenopathy under the care of Dr. Basilio Cairo. 3) Systemic chemotherapy again with carboplatin for AUC of 5 on day 1 and etoposide 100 Mg/M2 on days 1, 2 and 3 with Neulasta support.  First dose September 28, 2022.  Status post 5 cycles.    CURRENT THERAPY: Second line systemic chemotherapy with Zepzelca IV every 3 weeks.  First dose expected on 01/10/2023  INTERVAL HISTORY: Adam Jordan 77 y.o. male returns to the clinic today for a follow-up visit accompanied by his wife.  The patient was last seen on 12/21/2022.  He was supposed to have a restaging CT scan prior to that appointment but it was scheduled for after that cycle of treatment.  He denies any major changes in his health since his last being seen.  He is having some discomfort over his inguinal  hernia but he was evaluated by surgery and not found to be a surgical candidate due to his lung cancer.  He is currently using a brace around his abdomen and groin.   He is currently undergoing chemotherapy and he tolerates it well without any adverse side effects except he has flulike symptoms for few days after his Neulasta injection and he is taking Claritin as recommended.  He denies any fever, chills, or night sweats. His weight is stable. He has a good appetite.  Denies any chest pain, cough, or hemoptysis.  He gets shortness of breath in the humidity which is stable.  Denies any nausea, vomiting, diarrhea, or constipation. Denies any headaches. He recently had a restaging CT scan performed. He is here for evaluation and to review his scan results.      MEDICAL HISTORY: Past Medical History:  Diagnosis Date   Anxiety    Arthritis    osteoarthrititis- knees and most joints.   COPD (chronic obstructive pulmonary disease) (HCC)    moderate -no regular use of inhalers- rare use of oxygen as sexual activity   Dyspnea    outside in hot weather and also with pollen   Elevated blood-pressure reading, without diagnosis of hypertension 07/31/2019   Encounter for antineoplastic chemotherapy 03/04/2022   Encounter for antineoplastic immunotherapy 03/04/2022   History of heart artery stent 11/04/2021   Hypercholesterolemia 11/05/2013   Hyperlipidemia    Hypertension    Malaise and fatigue 04/19/2013  Neuromuscular disorder (HCC)    feet   Non-recurrent unilateral inguinal hernia without obstruction or gangrene 07/21/2021   Paroxysmal atrial flutter (HCC) 01/15/2021   Port-A-Cath in place 03/31/2022   Routine history and physical examination of adult 11/05/2013   Shoulder joint pain 08/23/2013   Status post lumbar microdiscectomy 07/31/2019   Thoracoabdominal aneurysm [facility])    s/p FEVAR 4 Vessel TABME 02/19/20 Dr. Michael Boston    ALLERGIES:  is allergic to losartan, atorvastatin calcium, and pollen  extract-tree extract [pollen extract].  MEDICATIONS:  Current Outpatient Medications  Medication Sig Dispense Refill   Alum Hydroxide-Mag Trisilicate (GAVISCON) 80-14.2 MG CHEW Chew 1 tablet by mouth daily as needed (heartburn).     apixaban (ELIQUIS) 5 MG TABS tablet Take 1 tablet by mouth twice daily 180 tablet 1   atorvastatin (LIPITOR) 20 MG tablet Take 20 mg by mouth at bedtime.     blood glucose meter kit and supplies Dispense based on patient and insurance preference. Use up to four times daily as directed. (FOR ICD-10 E10.9, E11.9). (Patient taking differently: 1 each by Other route See admin instructions. Dispense based on patient and insurance preference. Use up to four times daily as directed. (FOR ICD-10 E10.9, E11.9).) 1 each 0   clonazePAM (KLONOPIN) 1 MG tablet Take 1 tablet (1 mg total) by mouth 2 (two) times daily as needed for anxiety. 60 tablet 5   Continuous Blood Gluc Sensor (DEXCOM G7 SENSOR) MISC 1 Device by Does not apply route as directed. 9 each 3   fluticasone (FLONASE) 50 MCG/ACT nasal spray Place 1 spray into both nostrils 2 (two) times daily as needed for allergies.     insulin aspart (NOVOLOG FLEXPEN) 100 UNIT/ML FlexPen Max daily 30 units (Patient taking differently: Inject 1 Units into the skin 3 (three) times daily with meals. Max daily 30 units) 15 mL 11   insulin glargine (LANTUS) 100 UNIT/ML Solostar Pen Inject 24 Units into the skin daily. 30 mL 3   Insulin Pen Needle 32G X 4 MM MISC 1 Device by Does not apply route in the morning, at noon, in the evening, and at bedtime. 400 each 3   Lancets (ONETOUCH DELICA PLUS LANCET33G) MISC Apply 1 each topically 4 (four) times daily.     meloxicam (MOBIC) 15 MG tablet Take 15 mg by mouth daily as needed for pain.     Multiple Vitamin (MULTIVITAMIN WITH MINERALS) TABS tablet Take 1 tablet by mouth daily. 30 tablet 5   Naphazoline HCl (CLEAR EYES OP) Place 1 drop into both eyes daily.     nitroGLYCERIN (NITROSTAT) 0.4 MG  SL tablet Place 1 tablet (0.4 mg total) under the tongue every 5 (five) minutes as needed for chest pain. 25 tablet 6   ONETOUCH ULTRA test strip 1 each by Other route 3 (three) times daily. for testing 300 each 3   oxyCODONE-acetaminophen (PERCOCET/ROXICET) 5-325 MG tablet Take 1 tablet by mouth every 6 (six) hours as needed for severe pain. 30 tablet 0   PROAIR HFA 108 (90 BASE) MCG/ACT inhaler Inhale 2 puffs into the lungs every 6 (six) hours as needed for wheezing or shortness of breath.   2   sildenafil (REVATIO) 20 MG tablet Take 60 mg by mouth daily as needed (ED).     triamcinolone cream (KENALOG) 0.1 % Apply 1 Application topically daily.     lidocaine-prilocaine (EMLA) cream Apply to the Port-A-Cath site 30-60 minutes before chemotherapy (Patient taking differently: Apply 1 Application topically See  admin instructions. Apply to the Port-A-Cath site 30-60 minutes before chemotherapy) 30 g 0   Tiotropium Bromide-Olodaterol (STIOLTO RESPIMAT) 2.5-2.5 MCG/ACT AERS Inhale 2 puffs into the lungs daily. 4 g 0   traZODone (DESYREL) 50 MG tablet Take 50 mg by mouth at bedtime.     No current facility-administered medications for this visit.    SURGICAL HISTORY:  Past Surgical History:  Procedure Laterality Date   ABDOMINAL AORTIC ANEURYSM REPAIR  02/06/2020   BRONCHIAL BIOPSY  02/23/2022   Procedure: BRONCHIAL BIOPSIES;  Surgeon: Leslye Peer, MD;  Location: [facility] ENDOSCOPY;  Service: Pulmonary;;   BRONCHIAL BRUSHINGS  02/23/2022   Procedure: BRONCHIAL BRUSHINGS;  Surgeon: Leslye Peer, MD;  Location: [facility] ENDOSCOPY;  Service: Pulmonary;;   BRONCHIAL NEEDLE ASPIRATION BIOPSY  02/23/2022   Procedure: BRONCHIAL NEEDLE ASPIRATION BIOPSIES;  Surgeon: Leslye Peer, MD;  Location: [facility] ENDOSCOPY;  Service: Pulmonary;;   BROW LIFT Bilateral 12/11/2021   Procedure: UPPER LID BLEPHAROPLASTY;  Surgeon: Glenna Fellows, MD;  Location: [facility];  Service: Plastics;  Laterality:  Bilateral;   CYST REMOVAL LEG Left 07/03/2021   Procedure: EXCISION CYST LEFT BUTTOCK;  Surgeon: Griselda Miner, MD;  Location: [facility] OR;  Service: General;  Laterality: Left;   EYE SURGERY Bilateral    cataract surgery   FINGER ARTHROPLASTY Left    left thumb-Dr. Amanda Pea   INGUINAL HERNIA REPAIR Left 07/03/2021   Procedure: LEFT INGUINAL HERNIA REPAIR WITH MESH;  Surgeon: Griselda Miner, MD;  Location: [facility] OR;  Service: General;  Laterality: Left;   INSERTION OF MESH N/A 07/03/2021   Procedure: INSERTION OF MESH X2;  Surgeon: Griselda Miner, MD;  Location: [facility] OR;  Service: General;  Laterality: N/A;   IR IMAGING GUIDED PORT INSERTION  03/12/2022   IR RADIOLOGIST EVAL & MGMT  10/25/2019   KNEE ARTHROSCOPY Left    KNEE SURGERY Left    Bakers cyst x2   SHOULDER ARTHROSCOPY Right    thumb surgery     TONSILLECTOMY     TOTAL KNEE ARTHROPLASTY Left 03/19/2016   Procedure: LEFT TOTAL KNEE ARTHROPLASTY;  Surgeon: Eugenia Mcalpine, MD;  Location: [facility] ORS;  Service: Orthopedics;  Laterality: Left;   TOTAL SHOULDER REPLACEMENT Right    UMBILICAL HERNIA REPAIR N/A 07/03/2021   Procedure: UMBILICAL HERNIA REPAIR WITH MESH;  Surgeon: Griselda Miner, MD;  Location: [facility] OR;  Service: General;  Laterality: N/A;   VASCULAR SURGERY     VASECTOMY     VIDEO BRONCHOSCOPY WITH ENDOBRONCHIAL ULTRASOUND Right 02/23/2022   Procedure: VIDEO BRONCHOSCOPY WITH ENDOBRONCHIAL ULTRASOUND;  Surgeon: Leslye Peer, MD;  Location: [facility] ENDOSCOPY;  Service: Pulmonary;  Laterality: Right;   VIDEO BRONCHOSCOPY WITH RADIAL ENDOBRONCHIAL ULTRASOUND  02/23/2022   Procedure: VIDEO BRONCHOSCOPY WITH RADIAL ENDOBRONCHIAL ULTRASOUND;  Surgeon: Leslye Peer, MD;  Location: [facility] ENDOSCOPY;  Service: Pulmonary;;    REVIEW OF SYSTEMS:   Constitutional: Positive for fatigue after Neulasta injection.  Negative for appetite change, chills, fever and unexpected weight change.  HENT: Negative for mouth sores, nosebleeds, sore throat and trouble  swallowing.   Eyes: Negative for eye problems and icterus.  Respiratory: Positive for shortness of breath in the humidity.  Negative for cough, hemoptysis, and wheezing.   Cardiovascular: Negative for chest pain and leg swelling.  Gastrointestinal: Positive for discomfort near inguinal hernia. Negative for abdominal pain, constipation, diarrhea, nausea and vomiting.  Genitourinary: Negative for bladder incontinence, difficulty urinating, dysuria, frequency and hematuria.   Musculoskeletal:  Positive for chronic back pain.  Negative for gait problem, neck pain and neck stiffness.  Skin: Negative for itching and rash.  Neurological: Negative for dizziness, extremity weakness, gait problem, headaches, light-headedness and seizures.  Hematological: Negative for adenopathy. Does not bruise/bleed easily.  Psychiatric/Behavioral: Negative for confusion, depression and sleep disturbance. The patient is not nervous/anxious.   PHYSICAL EXAMINATION:  Pulse 80, temperature 98.2 F (36.8 C), resp. rate 18, weight 149 lb 12.8 oz (67.9 kg), SpO2 97 %.  ECOG PERFORMANCE STATUS: 1  Physical Exam  Constitutional: Oriented to person, place, and time and well-developed, well-nourished, and in no distress.  HENT:  Head: Normocephalic and atraumatic.  Mouth/Throat: Oropharynx is clear and moist. No oropharyngeal exudate.  Eyes: Conjunctivae are normal. Right eye exhibits no discharge. Left eye exhibits no discharge. No scleral icterus.  Neck: Normal range of motion. Neck supple.  Cardiovascular: Normal rate, regular rhythm, normal heart sounds and intact distal pulses.   Pulmonary/Chest: Effort normal and breath sounds normal. No respiratory distress. No wheezes. No rales.  Abdominal: Soft. Bowel sounds are normal. Exhibits no distension and no mass. There is no tenderness.  Musculoskeletal: Normal range of motion. Exhibits no edema.  Lymphadenopathy:    No cervical adenopathy.  Neurological: Alert and  oriented to person, place, and time. Exhibits normal muscle tone. Gait normal. Coordination normal.  Skin: Skin is warm and dry. No rash noted. Not diaphoretic. No erythema. No pallor.  Psychiatric: Mood, memory and judgment normal.  Vitals reviewed.  LABORATORY DATA: Lab Results  Component Value Date   WBC 14.1 (H) 01/03/2023   HGB 11.4 (L) 01/03/2023   HCT 33.8 (L) 01/03/2023   MCV 100.0 01/03/2023   PLT 126 (L) 01/03/2023      Chemistry      Component Value Date/Time   NA 135 01/03/2023 1336   NA 138 02/03/2021 0944   K 4.1 01/03/2023 1336   CL 103 01/03/2023 1336   CO2 26 01/03/2023 1336   BUN 9 01/03/2023 1336   BUN 13 02/03/2021 0944   CREATININE 0.84 01/03/2023 1336      Component Value Date/Time   CALCIUM 9.4 01/03/2023 1336   ALKPHOS 145 (H) 01/03/2023 1336   AST 16 01/03/2023 1336   ALT 17 01/03/2023 1336   BILITOT 0.4 01/03/2023 1336       RADIOGRAPHIC STUDIES:  CT Chest W Contrast  Result Date: 12/27/2022 CLINICAL DATA:  Extensive stage small cell lung cancer. Restaging. * Tracking Code: BO * EXAM: CT CHEST, ABDOMEN, AND PELVIS WITH CONTRAST TECHNIQUE: Multidetector CT imaging of the chest, abdomen and pelvis was performed following the standard protocol during bolus administration of intravenous contrast. RADIATION DOSE REDUCTION: This exam was performed according to the departmental dose-optimization program which includes automated exposure control, adjustment of the mA and/or kV according to patient size and/or use of iterative reconstruction technique. CONTRAST:  OMNIPAQUE IOHEXOL 300 MG/ML  SOLN COMPARISON:  10/29/2022 CT chest, abdomen and pelvis. FINDINGS: CT CHEST FINDINGS Cardiovascular: Normal heart size. No significant pericardial effusion/thickening. Three-vessel coronary atherosclerosis. Right internal jugular Port-A-Cath terminates in the middle third of the SVC. Atherosclerotic thoracic aorta with dilated 4.0 cm ascending thoracic aorta.  Normal caliber pulmonary arteries. No central pulmonary emboli. Mediastinum/Nodes: No significant thyroid nodules. Unremarkable esophagus. No axillary adenopathy. No new or recurrent pathologically enlarged mediastinal or hilar nodes. Lungs/Pleura: No pneumothorax. No pleural effusion. Moderate paraseptal and centrilobular emphysema with diffuse bronchial wall thickening. Dominant spiculated solid right lower lobe 1.7 x 1.0  cm pulmonary nodule (series 4/image 113), previously 1.7 x 0.9 cm using similar measurement technique on 10/29/2022 CT, not substantially changed. Additional clustered solid right lower lobe pulmonary nodules located more superiorly measuring up to 0.7 cm (series 4/image 103), previously 0.7 cm, unchanged. Indistinct 0.6 cm more inferior right lower lobe pulmonary nodule (series 4/image 126), previously 0.6 cm, stable. No acute consolidative airspace disease or new significant pulmonary nodules. Musculoskeletal: No aggressive appearing focal osseous lesions. Mild thoracic spondylosis. Partially visualized right shoulder hemiarthroplasty. CT ABDOMEN PELVIS FINDINGS Hepatobiliary: Numerous (greater than 15) hypodense liver masses scattered throughout the liver, increased in size and number. Representative peripheral segment 8 right liver 1.7 x 1.4 cm mass (series 2/image 63), increased from 0.8 x 0.7 cm on 10/29/2022 CT. Representative segment 4B left liver 2.7 x 2.0 cm mass (series 2/image 73), increased from 2.1 x 1.5 cm using similar measurement technique. Representative 1.7 x 1.6 cm central right liver mass near the IVC (series 2/image 62), increased from 0.9 x 0.8 cm. Normal gallbladder with no radiopaque cholelithiasis. No biliary ductal dilatation. Pancreas: Normal, with no mass or duct dilation. Spleen: Normal size spleen. Stable subcentimeter hypodense superior splenic lesion, too small to characterize. No new splenic lesions. Adrenals/Urinary Tract: No right adrenal nodules. Left adrenal  1.9 x 1.7 cm nodule with density 34 HU, mildly increased from 1.5 x 1.4 cm. Nonobstructing punctate 1 mm interpolar right renal stone. No hydronephrosis. No renal masses. Normal bladder. Stomach/Bowel: Normal non-distended stomach. Normal caliber small bowel with no small bowel wall thickening. Normal appendix. Marked left colonic diverticulosis, most prominent in the sigmoid colon, with no acute large bowel wall thickening or acute pericolonic fat stranding. Vascular/Lymphatic: Atherosclerotic abdominal aorta with abdominal aortic 4.3 cm aneurysm status post aorto bi-iliac stent graft repair, unchanged. Patent portal, splenic, hepatic and renal veins. No pathologically enlarged lymph nodes in the abdomen or pelvis. Reproductive: Top-normal size prostate. Other: No pneumoperitoneum, ascites or focal fluid collection. Musculoskeletal: No appreciable change in patchy faint lytic and sclerotic change in the right L4 vertebral body at the site of previous FDG uptake. No new focal osseous lesions. Marked multilevel lumbar degenerative disc disease. IMPRESSION: 1. Interval progression of widespread liver metastases. 2. Mild interval growth of left adrenal metastasis. 3. Stable multifocal right lower lobe pulmonary nodules. No new or progressive metastatic disease in the chest. 4. Stable subtle right L4 vertebral metastasis. No new focal osseous lesions. 5. Three-vessel coronary atherosclerosis. 6. Aortic Atherosclerosis (ICD10-I70.0) and Emphysema (ICD10-J43.9). Electronically Signed   By: Delbert Phenix M.D.   On: 12/27/2022 15:30   CT Abdomen Pelvis W Contrast  Result Date: 12/27/2022 CLINICAL DATA:  Extensive stage small cell lung cancer. Restaging. * Tracking Code: BO * EXAM: CT CHEST, ABDOMEN, AND PELVIS WITH CONTRAST TECHNIQUE: Multidetector CT imaging of the chest, abdomen and pelvis was performed following the standard protocol during bolus administration of intravenous contrast. RADIATION DOSE REDUCTION: This  exam was performed according to the departmental dose-optimization program which includes automated exposure control, adjustment of the mA and/or kV according to patient size and/or use of iterative reconstruction technique. CONTRAST:  OMNIPAQUE IOHEXOL 300 MG/ML  SOLN COMPARISON:  10/29/2022 CT chest, abdomen and pelvis. FINDINGS: CT CHEST FINDINGS Cardiovascular: Normal heart size. No significant pericardial effusion/thickening. Three-vessel coronary atherosclerosis. Right internal jugular Port-A-Cath terminates in the middle third of the SVC. Atherosclerotic thoracic aorta with dilated 4.0 cm ascending thoracic aorta. Normal caliber pulmonary arteries. No central pulmonary emboli. Mediastinum/Nodes: No significant thyroid nodules. Unremarkable esophagus.  No axillary adenopathy. No new or recurrent pathologically enlarged mediastinal or hilar nodes. Lungs/Pleura: No pneumothorax. No pleural effusion. Moderate paraseptal and centrilobular emphysema with diffuse bronchial wall thickening. Dominant spiculated solid right lower lobe 1.7 x 1.0 cm pulmonary nodule (series 4/image 113), previously 1.7 x 0.9 cm using similar measurement technique on 10/29/2022 CT, not substantially changed. Additional clustered solid right lower lobe pulmonary nodules located more superiorly measuring up to 0.7 cm (series 4/image 103), previously 0.7 cm, unchanged. Indistinct 0.6 cm more inferior right lower lobe pulmonary nodule (series 4/image 126), previously 0.6 cm, stable. No acute consolidative airspace disease or new significant pulmonary nodules. Musculoskeletal: No aggressive appearing focal osseous lesions. Mild thoracic spondylosis. Partially visualized right shoulder hemiarthroplasty. CT ABDOMEN PELVIS FINDINGS Hepatobiliary: Numerous (greater than 15) hypodense liver masses scattered throughout the liver, increased in size and number. Representative peripheral segment 8 right liver 1.7 x 1.4 cm mass (series 2/image 63),  increased from 0.8 x 0.7 cm on 10/29/2022 CT. Representative segment 4B left liver 2.7 x 2.0 cm mass (series 2/image 73), increased from 2.1 x 1.5 cm using similar measurement technique. Representative 1.7 x 1.6 cm central right liver mass near the IVC (series 2/image 62), increased from 0.9 x 0.8 cm. Normal gallbladder with no radiopaque cholelithiasis. No biliary ductal dilatation. Pancreas: Normal, with no mass or duct dilation. Spleen: Normal size spleen. Stable subcentimeter hypodense superior splenic lesion, too small to characterize. No new splenic lesions. Adrenals/Urinary Tract: No right adrenal nodules. Left adrenal 1.9 x 1.7 cm nodule with density 34 HU, mildly increased from 1.5 x 1.4 cm. Nonobstructing punctate 1 mm interpolar right renal stone. No hydronephrosis. No renal masses. Normal bladder. Stomach/Bowel: Normal non-distended stomach. Normal caliber small bowel with no small bowel wall thickening. Normal appendix. Marked left colonic diverticulosis, most prominent in the sigmoid colon, with no acute large bowel wall thickening or acute pericolonic fat stranding. Vascular/Lymphatic: Atherosclerotic abdominal aorta with abdominal aortic 4.3 cm aneurysm status post aorto bi-iliac stent graft repair, unchanged. Patent portal, splenic, hepatic and renal veins. No pathologically enlarged lymph nodes in the abdomen or pelvis. Reproductive: Top-normal size prostate. Other: No pneumoperitoneum, ascites or focal fluid collection. Musculoskeletal: No appreciable change in patchy faint lytic and sclerotic change in the right L4 vertebral body at the site of previous FDG uptake. No new focal osseous lesions. Marked multilevel lumbar degenerative disc disease. IMPRESSION: 1. Interval progression of widespread liver metastases. 2. Mild interval growth of left adrenal metastasis. 3. Stable multifocal right lower lobe pulmonary nodules. No new or progressive metastatic disease in the chest. 4. Stable subtle right  L4 vertebral metastasis. No new focal osseous lesions. 5. Three-vessel coronary atherosclerosis. 6. Aortic Atherosclerosis (ICD10-I70.0) and Emphysema (ICD10-J43.9). Electronically Signed   By: Delbert Phenix M.D.   On: 12/27/2022 15:30     ASSESSMENT/PLAN:  This is a very pleasant 77 year old Caucasian male with extensive stage (T1c, N2, M1C) small cell lung cancer presented with right lower lobe lung nodule in addition to right hilar and mediastinal lymphadenopathy and bone metastasis involving thoracic spines as well as the right iliac crest diagnosed in July 2023.    The patient started systemic chemotherapy with carboplatin for AUC of 5 on day 1, etoposide 100 Mg/M2 on days 1, 2 and 3 as well as Cosela 240 Mg/M2 on the days of the chemotherapy and Imfinzi 1500 Mg IV every 3 weeks with the induction treatment. He was status post 8 cycles. Starting from cycle #5 the patient was  on maintenance treatment with Imfinzi 1500 Mg IV every 4 weeks. Last dose was given on August 25, 2022 discontinued secondary to disease progression.   He then underwent palliative radiotherapy to the enlarging mediastinal lymphadenopathy under the care of Dr. Basilio Cairo.    The patient tolerated his previous course of systemic chemotherapy as well as the palliative radiation fairly well except for the aching pain in the sternal area after the radiation.    He then had a repeat CT CAP that showed His scan showed interval improvement in the mediastinal and right hilar adenopathy but unfortunately he has developed a new liver lesion worrisome for liver metastasis as well as developing left adrenal mass again worrisome for metastasis.   He started systemic chemotherapy again with carboplatin for AUC of 5 on day 1 and etoposide 100 Mg/M2 on days 1, 2 and 3 with Neulasta support every 3 weeks for 4 cycles.  He is status post 5 cycles.    The patient has been tolerating this treatment well with no concerning adverse effects except for  the fatigue and aching pain after the Neulasta injection.   The patient recently had a restaging CT scan performed.  The patient was seen with Dr. Arbutus Ped today.  Dr. Arbutus Ped personally and independently reviewed the scan and discussed the results with the patient today.  The scan showed for progression of the widespread liver metastases and mild growth of the left adrenal metastases.  The multifocal right lower lobe pulmonary nodules are stable and the L4 patient is stable.  Dr. Arbutus Ped had a lengthy discussion with the patient today about his current condition and recommended treatment options.  Dr. Arbutus Ped discussed second line systemic chemotherapy with Zepzelca 3.2 mg IV every 3 weeks.  The patient is interested in this option and he is expected to start his first dose of treatment on 01/10/2023.  We will see him back for follow-up visit in 4 weeks for evaluation repeat blood work before undergoing cycle #2.  The patient was advised to call immediately if he has any concerning symptoms in the interval. The patient voices understanding of current disease status and treatment options and is in agreement with the current care plan. All questions were answered. The patient knows to call the clinic with any problems, questions or concerns. We can certainly see the patient much sooner if necessary          Orders Placed This Encounter  Procedures   CBC with Differential (Cancer Center Only)    Standing Status:   Future    Standing Expiration Date:   01/11/2024   CMP (Cancer Center only)    Standing Status:   Future    Standing Expiration Date:   01/11/2024   CK, total    Standing Status:   Future    Standing Expiration Date:   01/11/2024   CBC with Differential (Cancer Center Only)    Standing Status:   Future    Standing Expiration Date:   02/01/2024   CMP (Cancer Center only)    Standing Status:   Future    Standing Expiration Date:   02/01/2024   CBC with Differential (Cancer Center  Only)    Standing Status:   Future    Standing Expiration Date:   02/22/2024   CMP (Cancer Center only)    Standing Status:   Future    Standing Expiration Date:   02/22/2024   CBC with Differential (Cancer Center Only)    Standing Status:  Future    Standing Expiration Date:   03/14/2024   CMP (Cancer Center only)    Standing Status:   Future    Standing Expiration Date:   03/14/2024     Johnette Abraham Waqas Bruhl, PA-C 01/04/23   ADDENDUM: Hematology/Oncology Attending: I had a face-to-face encounter with the patient today.  I reviewed his record, lab, scan and recommended his care plan.  This is a very pleasant 77 years old white male with extensive stage small cell lung cancer diagnosed in July 2023.  He is status post induction systemic chemotherapy with carboplatin, etoposide and Imfinzi for 4 cycles followed by maintenance treatment with Imfinzi for 4 more cycles before he developed disease progression.  The patient was retreated again with carboplatin, etoposide for 5 cycles.  He has been tolerating the treatment well except for the fatigue.  He also underwent SBRT to enlarging mediastinal lymph nodes. He had repeat CT scan of the chest, abdomen and pelvis performed recently.  I personally and independently reviewed the scan and discussed the result with the patient and his wife today.  Unfortunately his scan showed evidence for disease progression especially in the liver. I recommended for the patient to discontinue his current treatment with carboplatin and Doutova side.  I discussed with him other treatment options including second line treatment with Lurbinectedin 3.2 Mg/M2 every 3 weeks.  He is expected to start the first cycle of this treatment next week.  I discussed with him the adverse effect of this treatment including but not limited to alopecia, liver myelosuppression, nausea and vomiting, peripheral neuropathy, liver or renal dysfunction. We will consider repeating his imaging  studies after 2 cycles of this treatment and if he has any more evidence of disease progression, I will refer him to one of the tertiary center for consideration of treatment with Tarlatamab. The patient was advised to call immediately if he has any other concerning symptoms in the interval. The total time spent in the appointment was 30 minutes. Disclaimer: This note was dictated with voice recognition software. Similar sounding words can inadvertently be transcribed and may be missed upon review. Lajuana Matte, MD

## 2023-01-03 ENCOUNTER — Inpatient Hospital Stay: Payer: Medicare HMO

## 2023-01-03 ENCOUNTER — Inpatient Hospital Stay: Payer: Medicare HMO | Attending: Internal Medicine

## 2023-01-03 ENCOUNTER — Other Ambulatory Visit: Payer: Self-pay

## 2023-01-03 DIAGNOSIS — Z87891 Personal history of nicotine dependence: Secondary | ICD-10-CM | POA: Diagnosis not present

## 2023-01-03 DIAGNOSIS — Z5111 Encounter for antineoplastic chemotherapy: Secondary | ICD-10-CM | POA: Insufficient documentation

## 2023-01-03 DIAGNOSIS — C3431 Malignant neoplasm of lower lobe, right bronchus or lung: Secondary | ICD-10-CM | POA: Insufficient documentation

## 2023-01-03 DIAGNOSIS — C787 Secondary malignant neoplasm of liver and intrahepatic bile duct: Secondary | ICD-10-CM | POA: Insufficient documentation

## 2023-01-03 DIAGNOSIS — Z95828 Presence of other vascular implants and grafts: Secondary | ICD-10-CM

## 2023-01-03 DIAGNOSIS — C7972 Secondary malignant neoplasm of left adrenal gland: Secondary | ICD-10-CM | POA: Diagnosis not present

## 2023-01-03 DIAGNOSIS — C7951 Secondary malignant neoplasm of bone: Secondary | ICD-10-CM | POA: Insufficient documentation

## 2023-01-03 DIAGNOSIS — Z79899 Other long term (current) drug therapy: Secondary | ICD-10-CM | POA: Insufficient documentation

## 2023-01-03 LAB — CBC WITH DIFFERENTIAL (CANCER CENTER ONLY)
Abs Immature Granulocytes: 0.24 10*3/uL — ABNORMAL HIGH (ref 0.00–0.07)
Basophils Absolute: 0.1 10*3/uL (ref 0.0–0.1)
Basophils Relative: 1 %
Eosinophils Absolute: 0.1 10*3/uL (ref 0.0–0.5)
Eosinophils Relative: 1 %
HCT: 33.8 % — ABNORMAL LOW (ref 39.0–52.0)
Hemoglobin: 11.4 g/dL — ABNORMAL LOW (ref 13.0–17.0)
Immature Granulocytes: 2 %
Lymphocytes Relative: 7 %
Lymphs Abs: 1 10*3/uL (ref 0.7–4.0)
MCH: 33.7 pg (ref 26.0–34.0)
MCHC: 33.7 g/dL (ref 30.0–36.0)
MCV: 100 fL (ref 80.0–100.0)
Monocytes Absolute: 1.1 10*3/uL — ABNORMAL HIGH (ref 0.1–1.0)
Monocytes Relative: 8 %
Neutro Abs: 11.6 10*3/uL — ABNORMAL HIGH (ref 1.7–7.7)
Neutrophils Relative %: 81 %
Platelet Count: 126 10*3/uL — ABNORMAL LOW (ref 150–400)
RBC: 3.38 MIL/uL — ABNORMAL LOW (ref 4.22–5.81)
RDW: 15.8 % — ABNORMAL HIGH (ref 11.5–15.5)
WBC Count: 14.1 10*3/uL — ABNORMAL HIGH (ref 4.0–10.5)
nRBC: 0 % (ref 0.0–0.2)

## 2023-01-03 LAB — CMP (CANCER CENTER ONLY)
ALT: 17 U/L (ref 0–44)
AST: 16 U/L (ref 15–41)
Albumin: 4 g/dL (ref 3.5–5.0)
Alkaline Phosphatase: 145 U/L — ABNORMAL HIGH (ref 38–126)
Anion gap: 6 (ref 5–15)
BUN: 9 mg/dL (ref 8–23)
CO2: 26 mmol/L (ref 22–32)
Calcium: 9.4 mg/dL (ref 8.9–10.3)
Chloride: 103 mmol/L (ref 98–111)
Creatinine: 0.84 mg/dL (ref 0.61–1.24)
GFR, Estimated: >60 mL/min
Glucose, Bld: 329 mg/dL — ABNORMAL HIGH (ref 70–99)
Potassium: 4.1 mmol/L (ref 3.5–5.1)
Sodium: 135 mmol/L (ref 135–145)
Total Bilirubin: 0.4 mg/dL (ref 0.3–1.2)
Total Protein: 6.6 g/dL (ref 6.5–8.1)

## 2023-01-03 MED ORDER — SODIUM CHLORIDE 0.9% FLUSH
10.0000 mL | Freq: Once | INTRAVENOUS | Status: AC
  Administered 2023-01-03: 10 mL

## 2023-01-03 MED ORDER — HEPARIN SOD (PORK) LOCK FLUSH 100 UNIT/ML IV SOLN
500.0000 [IU] | Freq: Once | INTRAVENOUS | Status: AC
  Administered 2023-01-03: 500 [IU]

## 2023-01-04 ENCOUNTER — Inpatient Hospital Stay (HOSPITAL_BASED_OUTPATIENT_CLINIC_OR_DEPARTMENT_OTHER): Payer: Medicare HMO | Admitting: Physician Assistant

## 2023-01-04 ENCOUNTER — Inpatient Hospital Stay: Payer: Medicare HMO

## 2023-01-04 ENCOUNTER — Encounter: Payer: Self-pay | Admitting: Internal Medicine

## 2023-01-04 VITALS — HR 80 | Temp 98.2°F | Resp 18 | Wt 149.8 lb

## 2023-01-04 DIAGNOSIS — Z5111 Encounter for antineoplastic chemotherapy: Secondary | ICD-10-CM | POA: Diagnosis not present

## 2023-01-04 DIAGNOSIS — C787 Secondary malignant neoplasm of liver and intrahepatic bile duct: Secondary | ICD-10-CM | POA: Diagnosis not present

## 2023-01-04 DIAGNOSIS — C7972 Secondary malignant neoplasm of left adrenal gland: Secondary | ICD-10-CM | POA: Diagnosis not present

## 2023-01-04 DIAGNOSIS — C3431 Malignant neoplasm of lower lobe, right bronchus or lung: Secondary | ICD-10-CM | POA: Diagnosis not present

## 2023-01-04 DIAGNOSIS — Z87891 Personal history of nicotine dependence: Secondary | ICD-10-CM | POA: Diagnosis not present

## 2023-01-04 DIAGNOSIS — Z7189 Other specified counseling: Secondary | ICD-10-CM

## 2023-01-04 DIAGNOSIS — Z79899 Other long term (current) drug therapy: Secondary | ICD-10-CM | POA: Diagnosis not present

## 2023-01-04 DIAGNOSIS — C7951 Secondary malignant neoplasm of bone: Secondary | ICD-10-CM | POA: Diagnosis not present

## 2023-01-04 HISTORY — DX: Other specified counseling: Z71.89

## 2023-01-04 NOTE — Progress Notes (Signed)
DISCONTINUE OFF PATHWAY REGIMEN - Small Cell Lung   [id number]:Carboplatin AUC=5 IV D1 + Etoposide 100 mg/m2 IV D1-3 q21 Days:   A cycle is every 21 days:     Carboplatin      Etoposide   **Always confirm dose/schedule in your pharmacy ordering system**  REASON: Disease Progression PRIOR TREATMENT: Off Pathway: Carboplatin AUC=5 IV D1 + Etoposide 100 mg/m2 IV D1-3 q21 Days TREATMENT RESPONSE: Progressive Disease (PD)  START OFF PATHWAY REGIMEN - Small Cell Lung   [id number]:Lurbinectedin 3.2 mg/m2 IV D1 q21 Days:   A cycle is every 21 days:     Lurbinectedin   **Always confirm dose/schedule in your pharmacy ordering system**  Patient Characteristics: Relapsed or Progressive Disease, Third Line and Beyond Therapeutic Status: Relapsed or Progressive Disease Line of Therapy: Third Line and Beyond  Intent of Therapy: Non-Curative / Palliative Intent, Discussed with Patient

## 2023-01-04 NOTE — Progress Notes (Signed)
OFF PATHWAY REGIMEN - Small Cell Lung  No Change  Continue With Treatment as Ordered.  Original Decision Date/Time: 09/22/2022 12:03   [id number]:Carboplatin AUC=5 IV D1 + Etoposide 100 mg/m2 IV D1-3 q21 Days:   A cycle is every 21 days:     Carboplatin      Etoposide   **Always confirm dose/schedule in your pharmacy ordering system**  Patient Characteristics: Relapsed or Progressive Disease, Second Line, Relapse ? 6 Months Therapeutic Status: Relapsed or Progressive Disease Line of Therapy: Second Line Time to Relapse: Relapse ? 6 Months Intent of Therapy: Non-Curative / Palliative Intent, Discussed with Patient

## 2023-01-07 ENCOUNTER — Telehealth: Payer: Self-pay | Admitting: Medical Oncology

## 2023-01-07 ENCOUNTER — Other Ambulatory Visit: Payer: Self-pay | Admitting: Physician Assistant

## 2023-01-07 DIAGNOSIS — G893 Neoplasm related pain (acute) (chronic): Secondary | ICD-10-CM

## 2023-01-07 MED ORDER — OXYCODONE-ACETAMINOPHEN 5-325 MG PO TABS
1.0000 | ORAL_TABLET | Freq: Four times a day (QID) | ORAL | 0 refills | Status: DC | PRN
Start: 2023-01-07 — End: 2023-01-19

## 2023-01-07 MED FILL — Dexamethasone Sodium Phosphate Inj 100 MG/10ML: INTRAMUSCULAR | Qty: 1 | Status: AC

## 2023-01-07 NOTE — Telephone Encounter (Signed)
Refill Oxycodone requested.

## 2023-01-10 ENCOUNTER — Other Ambulatory Visit: Payer: Self-pay

## 2023-01-10 ENCOUNTER — Inpatient Hospital Stay: Payer: Medicare HMO

## 2023-01-10 ENCOUNTER — Inpatient Hospital Stay: Payer: Medicare HMO | Admitting: Internal Medicine

## 2023-01-10 VITALS — BP 152/63 | HR 69 | Temp 98.3°F | Resp 18 | Wt 149.9 lb

## 2023-01-10 DIAGNOSIS — C3431 Malignant neoplasm of lower lobe, right bronchus or lung: Secondary | ICD-10-CM | POA: Diagnosis not present

## 2023-01-10 DIAGNOSIS — C787 Secondary malignant neoplasm of liver and intrahepatic bile duct: Secondary | ICD-10-CM | POA: Diagnosis not present

## 2023-01-10 DIAGNOSIS — C7951 Secondary malignant neoplasm of bone: Secondary | ICD-10-CM | POA: Diagnosis not present

## 2023-01-10 DIAGNOSIS — Z5111 Encounter for antineoplastic chemotherapy: Secondary | ICD-10-CM | POA: Diagnosis not present

## 2023-01-10 DIAGNOSIS — Z95828 Presence of other vascular implants and grafts: Secondary | ICD-10-CM

## 2023-01-10 DIAGNOSIS — Z87891 Personal history of nicotine dependence: Secondary | ICD-10-CM | POA: Diagnosis not present

## 2023-01-10 DIAGNOSIS — C7972 Secondary malignant neoplasm of left adrenal gland: Secondary | ICD-10-CM | POA: Diagnosis not present

## 2023-01-10 DIAGNOSIS — Z79899 Other long term (current) drug therapy: Secondary | ICD-10-CM | POA: Diagnosis not present

## 2023-01-10 LAB — CBC WITH DIFFERENTIAL (CANCER CENTER ONLY)
Abs Immature Granulocytes: 0.04 10*3/uL (ref 0.00–0.07)
Basophils Absolute: 0.1 10*3/uL (ref 0.0–0.1)
Basophils Relative: 1 %
Eosinophils Absolute: 0.1 10*3/uL (ref 0.0–0.5)
Eosinophils Relative: 1 %
HCT: 33.9 % — ABNORMAL LOW (ref 39.0–52.0)
Hemoglobin: 11.8 g/dL — ABNORMAL LOW (ref 13.0–17.0)
Immature Granulocytes: 1 %
Lymphocytes Relative: 10 %
Lymphs Abs: 0.8 10*3/uL (ref 0.7–4.0)
MCH: 34.6 pg — ABNORMAL HIGH (ref 26.0–34.0)
MCHC: 34.8 g/dL (ref 30.0–36.0)
MCV: 99.4 fL (ref 80.0–100.0)
Monocytes Absolute: 0.7 10*3/uL (ref 0.1–1.0)
Monocytes Relative: 8 %
Neutro Abs: 6.3 10*3/uL (ref 1.7–7.7)
Neutrophils Relative %: 79 %
Platelet Count: 185 10*3/uL (ref 150–400)
RBC: 3.41 MIL/uL — ABNORMAL LOW (ref 4.22–5.81)
RDW: 15.3 % (ref 11.5–15.5)
WBC Count: 8 10*3/uL (ref 4.0–10.5)
nRBC: 0 % (ref 0.0–0.2)

## 2023-01-10 LAB — CMP (CANCER CENTER ONLY)
ALT: 15 U/L (ref 0–44)
AST: 16 U/L (ref 15–41)
Albumin: 3.9 g/dL (ref 3.5–5.0)
Alkaline Phosphatase: 98 U/L (ref 38–126)
Anion gap: 6 (ref 5–15)
BUN: 10 mg/dL (ref 8–23)
CO2: 26 mmol/L (ref 22–32)
Calcium: 9.2 mg/dL (ref 8.9–10.3)
Chloride: 104 mmol/L (ref 98–111)
Creatinine: 0.8 mg/dL (ref 0.61–1.24)
GFR, Estimated: >60 mL/min
Glucose, Bld: 277 mg/dL — ABNORMAL HIGH (ref 70–99)
Potassium: 4 mmol/L (ref 3.5–5.1)
Sodium: 136 mmol/L (ref 135–145)
Total Bilirubin: 0.5 mg/dL (ref 0.3–1.2)
Total Protein: 6.4 g/dL — ABNORMAL LOW (ref 6.5–8.1)

## 2023-01-10 LAB — CK: Total CK: 86 U/L (ref 49–397)

## 2023-01-10 MED ORDER — SODIUM CHLORIDE 0.9% FLUSH
10.0000 mL | Freq: Once | INTRAVENOUS | Status: AC
  Administered 2023-01-10: 10 mL

## 2023-01-10 MED ORDER — HEPARIN SOD (PORK) LOCK FLUSH 100 UNIT/ML IV SOLN
500.0000 [IU] | Freq: Once | INTRAVENOUS | Status: AC | PRN
  Administered 2023-01-10: 500 [IU]

## 2023-01-10 MED ORDER — SODIUM CHLORIDE 0.9 % IV SOLN: Freq: Once | INTRAVENOUS | Status: AC

## 2023-01-10 MED ORDER — PALONOSETRON HCL INJECTION 0.25 MG/5ML
0.2500 mg | Freq: Once | INTRAVENOUS | Status: AC
  Administered 2023-01-10: 0.25 mg via INTRAVENOUS

## 2023-01-10 MED ORDER — SODIUM CHLORIDE 0.9% FLUSH
10.0000 mL | INTRAVENOUS | Status: DC | PRN
  Administered 2023-01-10: 10 mL

## 2023-01-10 MED ORDER — SODIUM CHLORIDE 0.9 % IV SOLN
10.0000 mg | Freq: Once | INTRAVENOUS | Status: AC
  Administered 2023-01-10: 10 mg via INTRAVENOUS
  Filled 2023-01-10: qty 10

## 2023-01-10 MED ORDER — SODIUM CHLORIDE 0.9 % IV SOLN
3.2000 mg/m2 | Freq: Once | INTRAVENOUS | Status: AC
  Administered 2023-01-10: 5.75 mg via INTRAVENOUS
  Filled 2023-01-10: qty 11.5

## 2023-01-10 NOTE — Patient Instructions (Signed)
[facility]  Discharge Instructions: Thank you for choosing [facility] to provide your oncology and hematology care.   If you have a lab appointment with the Cancer Center, please go directly to the Cancer Center and check in at the registration area.   Wear comfortable clothing and clothing appropriate for easy access to any Portacath or PICC line.   We strive to give you quality time with your provider. You may need to reschedule your appointment if you arrive late (15 or more minutes).  Arriving late affects you and other patients whose appointments are after yours.  Also, if you miss three or more appointments without notifying the office, you may be dismissed from the clinic at the provider's discretion.      For prescription refill requests, have your pharmacy contact our office and allow 72 hours for refills to be completed.    Today you received the following chemotherapy and/or immunotherapy agents zepzelca      To help prevent nausea and vomiting after your treatment, we encourage you to take your nausea medication as directed.  BELOW ARE SYMPTOMS THAT SHOULD BE REPORTED IMMEDIATELY: *FEVER GREATER THAN 100.4 F (38 C) OR HIGHER *CHILLS OR SWEATING *NAUSEA AND VOMITING THAT IS NOT CONTROLLED WITH YOUR NAUSEA MEDICATION *UNUSUAL SHORTNESS OF BREATH *UNUSUAL BRUISING OR BLEEDING *URINARY PROBLEMS (pain or burning when urinating, or frequent urination) *BOWEL PROBLEMS (unusual diarrhea, constipation, pain near the anus) TENDERNESS IN MOUTH AND THROAT WITH OR WITHOUT PRESENCE OF ULCERS (sore throat, sores in mouth, or a toothache) UNUSUAL RASH, SWELLING OR PAIN  UNUSUAL VAGINAL DISCHARGE OR ITCHING   Items with * indicate a potential emergency and should be followed up as soon as possible or go to the Emergency Department if any problems should occur.  Please show the CHEMOTHERAPY ALERT CARD or IMMUNOTHERAPY ALERT CARD at  check-in to the Emergency Department and triage nurse.  Should you have questions after your visit or need to cancel or reschedule your appointment, please contact [facility]  Dept: [phone]  and follow the prompts.  Office hours are 8:00 a.m. to 4:30 p.m. Monday - Friday. Please note that voicemails left after 4:00 p.m. may not be returned until the following business day.  We are closed weekends and major holidays. You have access to a nurse at all times for urgent questions. Please call the main number to the clinic Dept: [phone] and follow the prompts.   For any non-urgent questions, you may also contact your provider using MyChart. We now offer e-Visits for anyone 18 and older to request care online for non-urgent symptoms. For details visit [email or web address].   Also download the MyChart app! Go to the app store, search "MyChart", open the app, select [facility], and log in with your MyChart username and password.   

## 2023-01-11 DIAGNOSIS — M12811 Other specific arthropathies, not elsewhere classified, right shoulder: Secondary | ICD-10-CM | POA: Diagnosis not present

## 2023-01-11 DIAGNOSIS — M1990 Unspecified osteoarthritis, unspecified site: Secondary | ICD-10-CM | POA: Diagnosis not present

## 2023-01-11 DIAGNOSIS — Z0000 Encounter for general adult medical examination without abnormal findings: Secondary | ICD-10-CM | POA: Diagnosis not present

## 2023-01-11 DIAGNOSIS — I700 Atherosclerosis of aorta: Secondary | ICD-10-CM | POA: Diagnosis not present

## 2023-01-11 DIAGNOSIS — E099 Drug or chemical induced diabetes mellitus without complications: Secondary | ICD-10-CM | POA: Diagnosis not present

## 2023-01-11 DIAGNOSIS — C787 Secondary malignant neoplasm of liver and intrahepatic bile duct: Secondary | ICD-10-CM | POA: Diagnosis not present

## 2023-01-11 DIAGNOSIS — Z95828 Presence of other vascular implants and grafts: Secondary | ICD-10-CM | POA: Diagnosis not present

## 2023-01-11 DIAGNOSIS — I4892 Unspecified atrial flutter: Secondary | ICD-10-CM | POA: Diagnosis not present

## 2023-01-11 DIAGNOSIS — C7951 Secondary malignant neoplasm of bone: Secondary | ICD-10-CM | POA: Diagnosis not present

## 2023-01-12 ENCOUNTER — Ambulatory Visit: Payer: Medicare HMO

## 2023-01-12 DIAGNOSIS — E109 Type 1 diabetes mellitus without complications: Secondary | ICD-10-CM | POA: Diagnosis not present

## 2023-01-13 ENCOUNTER — Ambulatory Visit: Payer: Medicare HMO

## 2023-01-13 DIAGNOSIS — R69 Illness, unspecified: Secondary | ICD-10-CM | POA: Diagnosis not present

## 2023-01-15 ENCOUNTER — Ambulatory Visit: Payer: Medicare HMO

## 2023-01-17 ENCOUNTER — Other Ambulatory Visit: Payer: Medicare HMO

## 2023-01-19 ENCOUNTER — Other Ambulatory Visit: Payer: Self-pay | Admitting: Internal Medicine

## 2023-01-19 ENCOUNTER — Telehealth: Payer: Self-pay | Admitting: Medical Oncology

## 2023-01-19 DIAGNOSIS — G893 Neoplasm related pain (acute) (chronic): Secondary | ICD-10-CM

## 2023-01-19 MED ORDER — OXYCODONE-ACETAMINOPHEN 5-325 MG PO TABS
1.0000 | ORAL_TABLET | Freq: Four times a day (QID) | ORAL | 0 refills | Status: DC | PRN
Start: 2023-01-19 — End: 2023-01-31

## 2023-01-19 NOTE — Telephone Encounter (Signed)
Requests refill for Oxycodone. 

## 2023-01-21 ENCOUNTER — Telehealth: Payer: Self-pay | Admitting: *Deleted

## 2023-01-21 NOTE — Progress Notes (Signed)
  Care Coordination   Note   01/21/2023 Name: Adam Jordan MRN: [id number] DOB: 02-21-1946  Adam Jordan is a 77 y.o. year old male who sees [name], [place] for primary care. I reached out to Marthe Patch by phone today to offer care coordination services.  Mr. Pracht was given information about Care Coordination services today including:   The Care Coordination services include support from the care team which includes your Nurse Coordinator, Clinical Social Worker, or Pharmacist.  The Care Coordination team is here to help remove barriers to the health concerns and goals most important to you. Care Coordination services are voluntary, and the patient may decline or stop services at any time by request to their care team member.   Care Coordination Consent Status: Patient agreed to services and verbal consent obtained.   Follow up plan:  Telephone appointment with care coordination team member scheduled for:  01/31/2023  Encounter Outcome:  Pt. Scheduled   Burman Nieves, CCMA Care Coordination Care Guide Direct Dial: [phone]

## 2023-01-31 ENCOUNTER — Other Ambulatory Visit: Payer: Self-pay | Admitting: Internal Medicine

## 2023-01-31 ENCOUNTER — Ambulatory Visit: Payer: Self-pay

## 2023-01-31 DIAGNOSIS — G893 Neoplasm related pain (acute) (chronic): Secondary | ICD-10-CM

## 2023-01-31 MED ORDER — OXYCODONE-ACETAMINOPHEN 5-325 MG PO TABS
1.0000 | ORAL_TABLET | Freq: Four times a day (QID) | ORAL | 0 refills | Status: DC | PRN
Start: 2023-01-31 — End: 2023-02-10

## 2023-01-31 MED FILL — Dexamethasone Sodium Phosphate Inj 100 MG/10ML: INTRAMUSCULAR | Qty: 1 | Status: AC

## 2023-01-31 NOTE — Patient Instructions (Signed)
Visit Information  Thank you for taking time to visit with me today. Please don't hesitate to contact me if I can be of assistance to you.   Following are the goals we discussed today:   Goals Addressed             This Visit's Progress    COMPLETED: Care Coordination Activities-No follow up required       Care Coordination Interventions: Evaluation of current treatment plan related to Lung Cancer and patient's adherence to plan as established by provider Assessed available transportation to appointments and treatments. Has consistent/reliable transportation: Yes Assessed support system. Has consistent/reliable family or other support: Yes PHQ2/PHQ9 performed  Patient assessed for needs.  Currently in treatment for lung cancer.  He is doing fair and offers no concerns.   Discussed [facility] services and support. Patient declines at this time. Wife retired Engineer, civil (consulting).            If you are experiencing a Mental Health or Behavioral Health Crisis or need someone to talk to, please call the Suicide and Crisis Lifeline: 988   Patient verbalizes understanding of instructions and care plan provided today and agrees to view in MyChart. Active MyChart status and patient understanding of how to access instructions and care plan via MyChart confirmed with patient.     The patient has been provided with contact information for the care management team and has been advised to call with any health related questions or concerns.   Bary Leriche, RN, MSN [facility] Care Management Care Management Coordinator Direct Line [phone]

## 2023-01-31 NOTE — Patient Outreach (Signed)
  Care Coordination   Initial Visit Note   01/31/2023 Name: BRAYLAN KLEVER MRN: [id number] DOB: [birth year]  ETHANN BONACCORSO is a 77 y.o. year old male who sees [name], [place] for primary care. I spoke with  Marthe Patch by phone today.  What matters to the patients health and wellness today?  Cancer treatment    Goals Addressed             This Visit's Progress    COMPLETED: Care Coordination Activities-No follow up required       Care Coordination Interventions: Evaluation of current treatment plan related to Lung Cancer and patient's adherence to plan as established by provider Assessed available transportation to appointments and treatments. Has consistent/reliable transportation: Yes Assessed support system. Has consistent/reliable family or other support: Yes PHQ2/PHQ9 performed  Patient assessed for needs.  Currently in treatment for lung cancer.  He is doing fair and offers no concerns.   Discussed [facility] services and support. Patient declines at this time. Wife retired Engineer, civil (consulting).           SDOH assessments and interventions completed:  Yes  SDOH Interventions Today    Flowsheet Row Most Recent Value  SDOH Interventions   Housing Interventions Intervention Not Indicated  Transportation Interventions Intervention Not Indicated        Care Coordination Interventions:  Yes, provided   Follow up plan: No further intervention required.   Encounter Outcome:  Pt. Visit Completed   Bary Leriche, RN, MSN [facility] Care Management Care Management Coordinator Direct Line [phone]

## 2023-02-01 ENCOUNTER — Other Ambulatory Visit: Payer: Self-pay

## 2023-02-01 ENCOUNTER — Other Ambulatory Visit: Payer: Medicare HMO

## 2023-02-01 ENCOUNTER — Inpatient Hospital Stay: Payer: Medicare HMO | Attending: Internal Medicine

## 2023-02-01 ENCOUNTER — Inpatient Hospital Stay (HOSPITAL_BASED_OUTPATIENT_CLINIC_OR_DEPARTMENT_OTHER): Payer: Medicare HMO | Admitting: Internal Medicine

## 2023-02-01 ENCOUNTER — Inpatient Hospital Stay: Payer: Medicare HMO

## 2023-02-01 VITALS — BP 137/64 | HR 69 | Temp 97.8°F | Resp 18

## 2023-02-01 DIAGNOSIS — C7951 Secondary malignant neoplasm of bone: Secondary | ICD-10-CM | POA: Diagnosis not present

## 2023-02-01 DIAGNOSIS — Z5111 Encounter for antineoplastic chemotherapy: Secondary | ICD-10-CM | POA: Insufficient documentation

## 2023-02-01 DIAGNOSIS — C3431 Malignant neoplasm of lower lobe, right bronchus or lung: Secondary | ICD-10-CM

## 2023-02-01 DIAGNOSIS — Z79899 Other long term (current) drug therapy: Secondary | ICD-10-CM | POA: Diagnosis not present

## 2023-02-01 LAB — CBC WITH DIFFERENTIAL (CANCER CENTER ONLY)
Abs Immature Granulocytes: 0.01 10*3/uL (ref 0.00–0.07)
Basophils Absolute: 0.1 10*3/uL (ref 0.0–0.1)
Basophils Relative: 1 %
Eosinophils Absolute: 0.1 10*3/uL (ref 0.0–0.5)
Eosinophils Relative: 3 %
HCT: 36.6 % — ABNORMAL LOW (ref 39.0–52.0)
Hemoglobin: 12.1 g/dL — ABNORMAL LOW (ref 13.0–17.0)
Immature Granulocytes: 0 %
Lymphocytes Relative: 19 %
Lymphs Abs: 0.8 10*3/uL (ref 0.7–4.0)
MCH: 32.9 pg (ref 26.0–34.0)
MCHC: 33.1 g/dL (ref 30.0–36.0)
MCV: 99.5 fL (ref 80.0–100.0)
Monocytes Absolute: 0.6 10*3/uL (ref 0.1–1.0)
Monocytes Relative: 13 %
Neutro Abs: 2.9 10*3/uL (ref 1.7–7.7)
Neutrophils Relative %: 64 %
Platelet Count: 170 10*3/uL (ref 150–400)
RBC: 3.68 MIL/uL — ABNORMAL LOW (ref 4.22–5.81)
RDW: 13.7 % (ref 11.5–15.5)
WBC Count: 4.4 10*3/uL (ref 4.0–10.5)
nRBC: 0 % (ref 0.0–0.2)

## 2023-02-01 LAB — CMP (CANCER CENTER ONLY)
ALT: 16 U/L (ref 0–44)
AST: 20 U/L (ref 15–41)
Albumin: 3.7 g/dL (ref 3.5–5.0)
Alkaline Phosphatase: 68 U/L (ref 38–126)
Anion gap: 5 (ref 5–15)
BUN: 14 mg/dL (ref 8–23)
CO2: 27 mmol/L (ref 22–32)
Calcium: 9 mg/dL (ref 8.9–10.3)
Chloride: 106 mmol/L (ref 98–111)
Creatinine: 0.84 mg/dL (ref 0.61–1.24)
GFR, Estimated: >60 mL/min (ref >60)
Glucose, Bld: 179 mg/dL — ABNORMAL HIGH (ref 70–99)
Potassium: 3.9 mmol/L (ref 3.5–5.1)
Sodium: 138 mmol/L (ref 135–145)
Total Bilirubin: 0.5 mg/dL (ref 0.3–1.2)
Total Protein: 5.9 g/dL — ABNORMAL LOW (ref 6.5–8.1)

## 2023-02-01 MED ORDER — SODIUM CHLORIDE 0.9 % IV SOLN: Freq: Once | INTRAVENOUS | Status: AC

## 2023-02-01 MED ORDER — SODIUM CHLORIDE 0.9% FLUSH
10.0000 mL | INTRAVENOUS | Status: DC | PRN
  Administered 2023-02-01: 10 mL

## 2023-02-01 MED ORDER — SODIUM CHLORIDE 0.9 % IV SOLN
10.0000 mg | Freq: Once | INTRAVENOUS | Status: AC
  Administered 2023-02-01: 10 mg via INTRAVENOUS
  Filled 2023-02-01: qty 10

## 2023-02-01 MED ORDER — PALONOSETRON HCL INJECTION 0.25 MG/5ML
0.2500 mg | Freq: Once | INTRAVENOUS | Status: AC
  Administered 2023-02-01: 0.25 mg via INTRAVENOUS
  Filled 2023-02-01: qty 5

## 2023-02-01 MED ORDER — HEPARIN SOD (PORK) LOCK FLUSH 100 UNIT/ML IV SOLN
500.0000 [IU] | Freq: Once | INTRAVENOUS | Status: AC | PRN
  Administered 2023-02-01: 500 [IU]

## 2023-02-01 MED ORDER — SODIUM CHLORIDE 0.9 % IV SOLN
3.2000 mg/m2 | Freq: Once | INTRAVENOUS | Status: AC
  Administered 2023-02-01: 5.75 mg via INTRAVENOUS
  Filled 2023-02-01: qty 11.5

## 2023-02-01 NOTE — Progress Notes (Signed)
[facility] [phone]   [phone]  OFFICE PROGRESS NOTE  [name], DO [address] [place] [address]  DIAGNOSIS:  1) Extensive stage (T1c, N2, M1C) small cell lung cancer presented with right lower lobe lung nodule in addition to right hilar and mediastinal lymphadenopathy and bone metastasis involving thoracic spines as well as the right iliac crest diagnosed in July 2023. 2) immunotherapy mediated type 1 diabetes mellitus diagnosed in October 2023.  PRIOR THERAPY:  1) Systemic chemotherapy with carboplatin for AUC of 5 on day 1, etoposide 100 Mg/M2 on days 1, 2 and 3 with Imfinzi 1500 Mg IV on day 1 and Cosela 240 Mg/M2 on the days of the chemotherapy as well as Imfinzi 1500 Mg IV on day 1 every 3 weeks the first 4 cycles followed by maintenance treatment every 4 weeks starting from cycle #5.  First dose was giving 03/10/2022.  The patient status post 8 cycles.  Last dose was given on August 25, 2022.  Starting from cycle #5 the patient will be on maintenance treatment with Imfinzi 1500 Mg IV every 4 weeks.  This treatment was discontinued secondary to disease progression. 2) Palliative radiotherapy to the enlarging mediastinal lymphadenopathy under the care of Dr. Basilio Cairo.  3) Systemic chemotherapy again with carboplatin for AUC of 5 on day 1 and etoposide 100 Mg/M2 on days 1, 2 and 3 with Neulasta support.  First dose September 28, 2022.  Status post 5 cycles.     CURRENT THERAPY: Second line systemic chemotherapy with Zepzelca IV every 3 weeks.  First dose on 01/10/2023.  Status post 1 cycle  INTERVAL HISTORY: Adam Jordan 77 y.o. male returns to the clinic today for follow-up visit.  The patient tolerated the first cycle of his treatment with Zepzelca (lurbinectedin) fairly well except for fatigue.  He denied having any current chest pain, shortness of breath except with exertion with no cough or hemoptysis.  He has no  nausea, vomiting, diarrhea or constipation.  He has no headache or visual changes.  He is here today for evaluation before starting cycle #2.   MEDICAL HISTORY: Past Medical History:  Diagnosis Date   Anxiety    Arthritis    osteoarthrititis- knees and most joints.   COPD (chronic obstructive pulmonary disease) (HCC)    moderate -no regular use of inhalers- rare use of oxygen as sexual activity   Dyspnea    outside in hot weather and also with pollen   Elevated blood-pressure reading, without diagnosis of hypertension 07/31/2019   Encounter for antineoplastic chemotherapy 03/04/2022   Encounter for antineoplastic immunotherapy 03/04/2022   History of heart artery stent 11/04/2021   Hypercholesterolemia 11/05/2013   Hyperlipidemia    Hypertension    Malaise and fatigue 04/19/2013   Neuromuscular disorder (HCC)    feet   Non-recurrent unilateral inguinal hernia without obstruction or gangrene 07/21/2021   Paroxysmal atrial flutter (HCC) 01/15/2021   Port-A-Cath in place 03/31/2022   Routine history and physical examination of adult 11/05/2013   Shoulder joint pain 08/23/2013   Status post lumbar microdiscectomy 07/31/2019   Thoracoabdominal aneurysm [facility])    s/p FEVAR 4 Vessel TABME 02/19/20 Dr. Michael Boston    ALLERGIES:  is allergic to losartan, atorvastatin calcium, and pollen extract-tree extract [pollen extract].  MEDICATIONS:  Current Outpatient Medications  Medication Sig Dispense Refill   Alum Hydroxide-Mag Trisilicate (GAVISCON) 80-14.2 MG CHEW Chew 1 tablet by mouth daily as needed (  heartburn).     apixaban (ELIQUIS) 5 MG TABS tablet Take 1 tablet by mouth twice daily 180 tablet 1   atorvastatin (LIPITOR) 20 MG tablet Take 20 mg by mouth at bedtime.     blood glucose meter kit and supplies Dispense based on patient and insurance preference. Use up to four times daily as directed. (FOR ICD-10 E10.9, E11.9). (Patient taking differently: 1 each by Other route See admin instructions.  Dispense based on patient and insurance preference. Use up to four times daily as directed. (FOR ICD-10 E10.9, E11.9).) 1 each 0   clonazePAM (KLONOPIN) 1 MG tablet Take 1 tablet (1 mg total) by mouth 2 (two) times daily as needed for anxiety. 60 tablet 5   Continuous Blood Gluc Sensor (DEXCOM G7 SENSOR) MISC 1 Device by Does not apply route as directed. 9 each 3   fluticasone (FLONASE) 50 MCG/ACT nasal spray Place 1 spray into both nostrils 2 (two) times daily as needed for allergies.     insulin aspart (NOVOLOG FLEXPEN) 100 UNIT/ML FlexPen Max daily 30 units (Patient taking differently: Inject 1 Units into the skin 3 (three) times daily with meals. Max daily 30 units) 15 mL 11   insulin glargine (LANTUS) 100 UNIT/ML Solostar Pen Inject 24 Units into the skin daily. 30 mL 3   Insulin Pen Needle 32G X 4 MM MISC 1 Device by Does not apply route in the morning, at noon, in the evening, and at bedtime. 400 each 3   Lancets (ONETOUCH DELICA PLUS LANCET33G) MISC Apply 1 each topically 4 (four) times daily.     lidocaine-prilocaine (EMLA) cream Apply to the Port-A-Cath site 30-60 minutes before chemotherapy (Patient taking differently: Apply 1 Application topically See admin instructions. Apply to the Port-A-Cath site 30-60 minutes before chemotherapy) 30 g 0   meloxicam (MOBIC) 15 MG tablet Take 15 mg by mouth daily as needed for pain.     Multiple Vitamin (MULTIVITAMIN WITH MINERALS) TABS tablet Take 1 tablet by mouth daily. 30 tablet 5   Naphazoline HCl (CLEAR EYES OP) Place 1 drop into both eyes daily.     nitroGLYCERIN (NITROSTAT) 0.4 MG SL tablet Place 1 tablet (0.4 mg total) under the tongue every 5 (five) minutes as needed for chest pain. 25 tablet 6   ONETOUCH ULTRA test strip 1 each by Other route 3 (three) times daily. for testing 300 each 3   oxyCODONE-acetaminophen (PERCOCET/ROXICET) 5-325 MG tablet Take 1 tablet by mouth every 6 (six) hours as needed for severe pain. 40 tablet 0   PROAIR HFA  108 (90 BASE) MCG/ACT inhaler Inhale 2 puffs into the lungs every 6 (six) hours as needed for wheezing or shortness of breath.   2   sildenafil (REVATIO) 20 MG tablet Take 60 mg by mouth daily as needed (ED).     Tiotropium Bromide-Olodaterol (STIOLTO RESPIMAT) 2.5-2.5 MCG/ACT AERS Inhale 2 puffs into the lungs daily. 4 g 0   traZODone (DESYREL) 50 MG tablet Take 50 mg by mouth at bedtime.     triamcinolone cream (KENALOG) 0.1 % Apply 1 Application topically daily.     No current facility-administered medications for this visit.    SURGICAL HISTORY:  Past Surgical History:  Procedure Laterality Date   ABDOMINAL AORTIC ANEURYSM REPAIR  02/06/2020   BRONCHIAL BIOPSY  02/23/2022   Procedure: BRONCHIAL BIOPSIES;  Surgeon: Leslye Peer, MD;  Location: [facility] ENDOSCOPY;  Service: Pulmonary;;   BRONCHIAL BRUSHINGS  02/23/2022   Procedure: BRONCHIAL BRUSHINGS;  Surgeon:  Leslye Peer, MD;  Location: [facility] ENDOSCOPY;  Service: Pulmonary;;   BRONCHIAL NEEDLE ASPIRATION BIOPSY  02/23/2022   Procedure: BRONCHIAL NEEDLE ASPIRATION BIOPSIES;  Surgeon: Leslye Peer, MD;  Location: [facility] ENDOSCOPY;  Service: Pulmonary;;   BROW LIFT Bilateral 12/11/2021   Procedure: UPPER LID BLEPHAROPLASTY;  Surgeon: Glenna Fellows, MD;  Location: [facility];  Service: Plastics;  Laterality: Bilateral;   CYST REMOVAL LEG Left 07/03/2021   Procedure: EXCISION CYST LEFT BUTTOCK;  Surgeon: Griselda Miner, MD;  Location: [facility] OR;  Service: General;  Laterality: Left;   EYE SURGERY Bilateral    cataract surgery   FINGER ARTHROPLASTY Left    left thumb-Dr. Amanda Pea   INGUINAL HERNIA REPAIR Left 07/03/2021   Procedure: LEFT INGUINAL HERNIA REPAIR WITH MESH;  Surgeon: Griselda Miner, MD;  Location: [facility] OR;  Service: General;  Laterality: Left;   INSERTION OF MESH N/A 07/03/2021   Procedure: INSERTION OF MESH X2;  Surgeon: Griselda Miner, MD;  Location: [facility] OR;  Service: General;  Laterality: N/A;   IR IMAGING GUIDED PORT  INSERTION  03/12/2022   IR RADIOLOGIST EVAL & MGMT  10/25/2019   KNEE ARTHROSCOPY Left    KNEE SURGERY Left    Bakers cyst x2   SHOULDER ARTHROSCOPY Right    thumb surgery     TONSILLECTOMY     TOTAL KNEE ARTHROPLASTY Left 03/19/2016   Procedure: LEFT TOTAL KNEE ARTHROPLASTY;  Surgeon: Eugenia Mcalpine, MD;  Location: [facility] ORS;  Service: Orthopedics;  Laterality: Left;   TOTAL SHOULDER REPLACEMENT Right    UMBILICAL HERNIA REPAIR N/A 07/03/2021   Procedure: UMBILICAL HERNIA REPAIR WITH MESH;  Surgeon: Griselda Miner, MD;  Location: [facility] OR;  Service: General;  Laterality: N/A;   VASCULAR SURGERY     VASECTOMY     VIDEO BRONCHOSCOPY WITH ENDOBRONCHIAL ULTRASOUND Right 02/23/2022   Procedure: VIDEO BRONCHOSCOPY WITH ENDOBRONCHIAL ULTRASOUND;  Surgeon: Leslye Peer, MD;  Location: [facility] ENDOSCOPY;  Service: Pulmonary;  Laterality: Right;   VIDEO BRONCHOSCOPY WITH RADIAL ENDOBRONCHIAL ULTRASOUND  02/23/2022   Procedure: VIDEO BRONCHOSCOPY WITH RADIAL ENDOBRONCHIAL ULTRASOUND;  Surgeon: Leslye Peer, MD;  Location: [facility] ENDOSCOPY;  Service: Pulmonary;;    REVIEW OF SYSTEMS:  A comprehensive review of systems was negative except for: Constitutional: positive for fatigue   PHYSICAL EXAMINATION: General appearance: alert, cooperative, fatigued, and no distress Head: Normocephalic, without obvious abnormality, atraumatic Neck: no adenopathy, no JVD, supple, symmetrical, trachea midline, and thyroid not enlarged, symmetric, no tenderness/mass/nodules Lymph nodes: Cervical, supraclavicular, and axillary nodes normal. Resp: clear to auscultation bilaterally Back: symmetric, no curvature. ROM normal. No CVA tenderness. Cardio: regular rate and rhythm, S1, S2 normal, no murmur, click, rub or gallop GI: soft, non-tender; bowel sounds normal; no masses,  no organomegaly Extremities: extremities normal, atraumatic, no cyanosis or edema  ECOG PERFORMANCE STATUS: 1 - Symptomatic but completely  ambulatory  Blood pressure 133/71, pulse 68, temperature 97.7 F (36.5 C), temperature source Oral, resp. rate 17, height 5\' 8"  (1.727 m), weight 151 lb 1.6 oz (68.5 kg), SpO2 98 %.  LABORATORY DATA: Lab Results  Component Value Date   WBC 8.0 01/10/2023   HGB 11.8 (L) 01/10/2023   HCT 33.9 (L) 01/10/2023   MCV 99.4 01/10/2023   PLT 185 01/10/2023      Chemistry      Component Value Date/Time   NA 136 01/10/2023 1005   NA 138 02/03/2021 0944   K 4.0 01/10/2023 1005   CL  104 01/10/2023 1005   CO2 26 01/10/2023 1005   BUN 10 01/10/2023 1005   BUN 13 02/03/2021 0944   CREATININE 0.80 01/10/2023 1005      Component Value Date/Time   CALCIUM 9.2 01/10/2023 1005   ALKPHOS 98 01/10/2023 1005   AST 16 01/10/2023 1005   ALT 15 01/10/2023 1005   BILITOT 0.5 01/10/2023 1005       RADIOGRAPHIC STUDIES: No results found.  ASSESSMENT AND PLAN: This is a very pleasant 77 years old white male with extensive stage (T1c, N2, M1C) small cell lung cancer presented with right lower lobe lung nodule in addition to right hilar and mediastinal lymphadenopathy and bone metastasis involving thoracic spines as well as the right iliac crest diagnosed in July 2023. The patient started systemic chemotherapy with carboplatin for AUC of 5 on day 1, etoposide 100 Mg/M2 on days 1, 2 and 3 as well as Cosela 240 Mg/M2 on the days of the chemotherapy and Imfinzi 1500 Mg IV every 3 weeks with the induction treatment.  He is status post 8 cycles.  Starting from cycle #5 the patient will be on maintenance treatment with Imfinzi 1500 Mg IV every 4 weeks.  Last dose was given on August 25, 2022 discontinued secondary to disease progression. He underwent palliative radiotherapy to the enlarging mediastinal lymphadenopathy under the care of Dr. Basilio Cairo. Repeat imaging studies showed interval improvement in the mediastinal and right hilar adenopathy but unfortunately he has developed a new liver lesion worrisome for  liver metastasis as well as developing left adrenal mass again worrisome for metastasis. He started systemic chemotherapy again with carboplatin for AUC of 5 on day 1 and etoposide 100 Mg/M2 on days 1, 2 and 3 with Neulasta support every 3 weeks for 4 cycles.  He is status post 5 cycles.  This treatment was discontinued secondary to disease progression. The patient is currently on treatment with Zepzelca (lurbinectedin) 3.2 Mg/M2 status post 1 cycle.  He tolerated the first cycle of his treatment fairly well with no concerning adverse effect except for fatigue. I recommended for him to proceed with cycle #2 today as planned. He will come back for follow-up visit in 3 weeks for evaluation before the next cycle of his treatment. For the immunotherapy mediated type 1 diabetes mellitus, he will continue his evaluation and treatment under Dr. Lonzo Cloud. He was advised to call immediately if he has any other concerning symptoms in the interval. The patient voices understanding of current disease status and treatment options and is in agreement with the current care plan.  All questions were answered. The patient knows to call the clinic with any problems, questions or concerns. We can certainly see the patient much sooner if necessary.  The total time spent in the appointment was 20 minutes.  Disclaimer: This note was dictated with voice recognition software. Similar sounding words can inadvertently be transcribed and may not be corrected upon review.

## 2023-02-01 NOTE — Patient Instructions (Signed)
[place] [facility]  Discharge Instructions: Thank you for choosing [facility] to provide your oncology and hematology care.   If you have a lab appointment with the Cancer Center, please go directly to the Cancer Center and check in at the registration area.   Wear comfortable clothing and clothing appropriate for easy access to any Portacath or PICC line.   We strive to give you quality time with your provider. You may need to reschedule your appointment if you arrive late (15 or more minutes).  Arriving late affects you and other patients whose appointments are after yours.  Also, if you miss three or more appointments without notifying the office, you may be dismissed from the clinic at the provider's discretion.      For prescription refill requests, have your pharmacy contact our office and allow 72 hours for refills to be completed.    Today you received the following chemotherapy and/or immunotherapy agents: Lurbinectedin (Zepzelca)     To help prevent nausea and vomiting after your treatment, we encourage you to take your nausea medication as directed.  BELOW ARE SYMPTOMS THAT SHOULD BE REPORTED IMMEDIATELY: *FEVER GREATER THAN 100.4 F (38 C) OR HIGHER *CHILLS OR SWEATING *NAUSEA AND VOMITING THAT IS NOT CONTROLLED WITH YOUR NAUSEA MEDICATION *UNUSUAL SHORTNESS OF BREATH *UNUSUAL BRUISING OR BLEEDING *URINARY PROBLEMS (pain or burning when urinating, or frequent urination) *BOWEL PROBLEMS (unusual diarrhea, constipation, pain near the anus) TENDERNESS IN MOUTH AND THROAT WITH OR WITHOUT PRESENCE OF ULCERS (sore throat, sores in mouth, or a toothache) UNUSUAL RASH, SWELLING OR PAIN  UNUSUAL VAGINAL DISCHARGE OR ITCHING   Items with * indicate a potential emergency and should be followed up as soon as possible or go to the Emergency Department if any problems should occur.  Please show the CHEMOTHERAPY ALERT CARD or IMMUNOTHERAPY ALERT  CARD at check-in to the Emergency Department and triage nurse.  Should you have questions after your visit or need to cancel or reschedule your appointment, please contact [place] [facility]  Dept: [phone]  and follow the prompts.  Office hours are 8:00 a.m. to 4:30 p.m. Monday - Friday. Please note that voicemails left after 4:00 p.m. may not be returned until the following business day.  We are closed weekends and major holidays. You have access to a nurse at all times for urgent questions. Please call the main number to the clinic Dept: [phone] and follow the prompts.   For any non-urgent questions, you may also contact your provider using MyChart. We now offer e-Visits for anyone 63 and older to request care online for non-urgent symptoms. For details visit [email or web address].   Also download the MyChart app! Go to the app store, search "MyChart", open the app, select [place], and log in with your MyChart username and password.

## 2023-02-10 ENCOUNTER — Other Ambulatory Visit: Payer: Self-pay | Admitting: Internal Medicine

## 2023-02-10 ENCOUNTER — Telehealth: Payer: Self-pay | Admitting: Medical Oncology

## 2023-02-10 DIAGNOSIS — K439 Ventral hernia without obstruction or gangrene: Secondary | ICD-10-CM

## 2023-02-10 DIAGNOSIS — G893 Neoplasm related pain (acute) (chronic): Secondary | ICD-10-CM

## 2023-02-10 MED ORDER — OXYCODONE-ACETAMINOPHEN 5-325 MG PO TABS: 1.0000 | ORAL_TABLET | Freq: Four times a day (QID) | ORAL | 0 refills | Status: DC | PRN

## 2023-02-10 NOTE — Telephone Encounter (Signed)
Oxycodone refill requested Requested referral to a surgeon to evaluate his hernia "it is as big as a goose egg" .    Can he take iron tablet for his "Low hgb"? Last HGB =12. I told him he probably does not need it because his HGB is very good

## 2023-02-11 ENCOUNTER — Telehealth: Payer: Self-pay | Admitting: Internal Medicine

## 2023-02-11 DIAGNOSIS — E109 Type 1 diabetes mellitus without complications: Secondary | ICD-10-CM | POA: Diagnosis not present

## 2023-02-11 NOTE — Telephone Encounter (Signed)
Referral sent to [facility] Dr. Magnus Ivan. They will f/u with pt.

## 2023-02-11 NOTE — Telephone Encounter (Signed)
Called patient regarding July and August appointments, left a voicemail.

## 2023-02-18 NOTE — Progress Notes (Unsigned)
[facility] OFFICE PROGRESS NOTE  [name], DO [address] [place] [address] [place] [address]  DIAGNOSIS: 1) Extensive stage (T1c, N2, M1C) small cell lung cancer presented with right lower lobe lung nodule in addition to right hilar and mediastinal lymphadenopathy and bone metastasis involving thoracic spines as well as the right iliac crest diagnosed in July 2023. 2) immunotherapy mediated type 1 diabetes mellitus diagnosed in October 2023.  PRIOR THERAPY:  1) Systemic chemotherapy with carboplatin for AUC of 5 on day 1, etoposide 100 Mg/M2 on days 1, 2 and 3 with Imfinzi 1500 Mg IV on day 1 and Cosela 240 Mg/M2 on the days of the chemotherapy as well as Imfinzi 1500 Mg IV on day 1 every 3 weeks the first 4 cycles followed by maintenance treatment every 4 weeks starting from cycle #5.  First dose was giving 03/10/2022.  The patient status post 8 cycles.  Last dose was given on August 25, 2022.  Starting from cycle #5 the patient will be on maintenance treatment with Imfinzi 1500 Mg IV every 4 weeks.  This treatment was discontinued secondary to disease progression. 2) Palliative radiotherapy to the enlarging mediastinal lymphadenopathy under the care of Dr. Basilio Cairo. 3) Systemic chemotherapy again with carboplatin for AUC of 5 on day 1 and etoposide 100 Mg/M2 on days 1, 2 and 3 with Neulasta support.  First dose September 28, 2022.  Status post 5 cycles.    CURRENT THERAPY: Second line systemic chemotherapy with Zepzelca IV every 3 weeks.  First dose on 01/10/2023. Status post 2 cycle.   INTERVAL HISTORY: Adam Jordan 77 y.o. male returns to the clinic today for a follow-up visit. He is currently undergoing chemotherapy with zepzelca after restaging scans in June showed disease progression. He is status post 2 cycles and has been tolerating it well except for fatigue. He has been struggling with pain due to an inguinal hernia. He is hoping to see another surgeon soon for a  second opinion. He states the hernia is the size of a goose egg. He is wanting to talk to them about what his risk is for strangulation. He takes oxycodone for pain. This was last sent yesterday but he states his pharmacy is out of this medication. He is hopeful that this will be in stock soon, but he knows to call me if I need to send this to another pharmacy.    He denies any fever, chills, or night sweats. He may have chills of his blood sugars get too low. His weight is stable. He has a good appetite.  Denies any chest pain, cough, or hemoptysis.  He gets shortness of breath in the humidity which is stable.   Denies any nausea, vomiting, diarrhea, or constipation. He is having regular bowel movements and takes laxatives. Denies any headaches. He states he needs to get his eyes checked due to trouble seeing long distance. He mentions he has disk degeneration in his back and sees ortho. He mentions some worsening lumbar pain that radiates down his right leg. He states the oxycodone helps control his pain. He is here for evaluation and repeat blood work before undergoing cycle #3.     MEDICAL HISTORY: Past Medical History:  Diagnosis Date   Anxiety    Arthritis    osteoarthrititis- knees and most joints.   COPD (chronic obstructive pulmonary disease) (HCC)    moderate -no regular use of inhalers- rare use of oxygen as sexual activity   Dyspnea  outside in hot weather and also with pollen   Elevated blood-pressure reading, without diagnosis of hypertension 07/31/2019   Encounter for antineoplastic chemotherapy 03/04/2022   Encounter for antineoplastic immunotherapy 03/04/2022   History of heart artery stent 11/04/2021   Hypercholesterolemia 11/05/2013   Hyperlipidemia    Hypertension    Malaise and fatigue 04/19/2013   Neuromuscular disorder (HCC)    feet   Non-recurrent unilateral inguinal hernia without obstruction or gangrene 07/21/2021   Paroxysmal atrial flutter (HCC) 01/15/2021    Port-A-Cath in place 03/31/2022   Routine history and physical examination of adult 11/05/2013   Shoulder joint pain 08/23/2013   Status post lumbar microdiscectomy 07/31/2019   Thoracoabdominal aneurysm [facility])    s/p FEVAR 4 Vessel TABME 02/19/20 Dr. Michael Boston    ALLERGIES:  is allergic to losartan, atorvastatin calcium, and pollen extract-tree extract [pollen extract].  MEDICATIONS:  Current Outpatient Medications  Medication Sig Dispense Refill   Alum Hydroxide-Mag Trisilicate (GAVISCON) 80-14.2 MG CHEW Chew 1 tablet by mouth daily as needed (heartburn).     apixaban (ELIQUIS) 5 MG TABS tablet Take 1 tablet by mouth twice daily 180 tablet 1   atorvastatin (LIPITOR) 20 MG tablet Take 20 mg by mouth at bedtime.     blood glucose meter kit and supplies Dispense based on patient and insurance preference. Use up to four times daily as directed. (FOR ICD-10 E10.9, E11.9). (Patient taking differently: 1 each by Other route See admin instructions. Dispense based on patient and insurance preference. Use up to four times daily as directed. (FOR ICD-10 E10.9, E11.9).) 1 each 0   clonazePAM (KLONOPIN) 1 MG tablet Take 1 tablet (1 mg total) by mouth 2 (two) times daily as needed for anxiety. 60 tablet 5   Continuous Blood Gluc Sensor (DEXCOM G7 SENSOR) MISC 1 Device by Does not apply route as directed. 9 each 3   fluticasone (FLONASE) 50 MCG/ACT nasal spray Place 1 spray into both nostrils 2 (two) times daily as needed for allergies.     insulin aspart (NOVOLOG FLEXPEN) 100 UNIT/ML FlexPen Max daily 30 units (Patient taking differently: Inject 1 Units into the skin 3 (three) times daily with meals. Max daily 30 units) 15 mL 11   insulin glargine (LANTUS) 100 UNIT/ML Solostar Pen Inject 24 Units into the skin daily. 30 mL 3   Insulin Pen Needle 32G X 4 MM MISC 1 Device by Does not apply route in the morning, at noon, in the evening, and at bedtime. 400 each 3   Lancets (ONETOUCH DELICA PLUS LANCET33G) MISC  Apply 1 each topically 4 (four) times daily.     lidocaine-prilocaine (EMLA) cream Apply to the Port-A-Cath site 30-60 minutes before chemotherapy (Patient taking differently: Apply 1 Application topically See admin instructions. Apply to the Port-A-Cath site 30-60 minutes before chemotherapy) 30 g 0   meloxicam (MOBIC) 15 MG tablet Take 15 mg by mouth daily as needed for pain.     Multiple Vitamin (MULTIVITAMIN WITH MINERALS) TABS tablet Take 1 tablet by mouth daily. 30 tablet 5   Naphazoline HCl (CLEAR EYES OP) Place 1 drop into both eyes daily.     nitroGLYCERIN (NITROSTAT) 0.4 MG SL tablet Place 1 tablet (0.4 mg total) under the tongue every 5 (five) minutes as needed for chest pain. 25 tablet 6   ONETOUCH ULTRA test strip 1 each by Other route 3 (three) times daily. for testing 300 each 3   oxyCODONE-acetaminophen (PERCOCET/ROXICET) 5-325 MG tablet Take 1 tablet by mouth  every 6 (six) hours as needed for severe pain. 40 tablet 0   PROAIR HFA 108 (90 BASE) MCG/ACT inhaler Inhale 2 puffs into the lungs every 6 (six) hours as needed for wheezing or shortness of breath.   2   sildenafil (REVATIO) 20 MG tablet Take 60 mg by mouth daily as needed (ED).     Tiotropium Bromide-Olodaterol (STIOLTO RESPIMAT) 2.5-2.5 MCG/ACT AERS Inhale 2 puffs into the lungs daily. 4 g 0   traZODone (DESYREL) 50 MG tablet Take 50 mg by mouth at bedtime.     triamcinolone cream (KENALOG) 0.1 % Apply 1 Application topically daily.     No current facility-administered medications for this visit.    SURGICAL HISTORY:  Past Surgical History:  Procedure Laterality Date   ABDOMINAL AORTIC ANEURYSM REPAIR  02/06/2020   BRONCHIAL BIOPSY  02/23/2022   Procedure: BRONCHIAL BIOPSIES;  Surgeon: Leslye Peer, MD;  Location: [facility] ENDOSCOPY;  Service: Pulmonary;;   BRONCHIAL BRUSHINGS  02/23/2022   Procedure: BRONCHIAL BRUSHINGS;  Surgeon: Leslye Peer, MD;  Location: [facility] ENDOSCOPY;  Service: Pulmonary;;   BRONCHIAL NEEDLE  ASPIRATION BIOPSY  02/23/2022   Procedure: BRONCHIAL NEEDLE ASPIRATION BIOPSIES;  Surgeon: Leslye Peer, MD;  Location: [facility] ENDOSCOPY;  Service: Pulmonary;;   BROW LIFT Bilateral 12/11/2021   Procedure: UPPER LID BLEPHAROPLASTY;  Surgeon: Glenna Fellows, MD;  Location: [facility];  Service: Plastics;  Laterality: Bilateral;   CYST REMOVAL LEG Left 07/03/2021   Procedure: EXCISION CYST LEFT BUTTOCK;  Surgeon: Griselda Miner, MD;  Location: [facility] OR;  Service: General;  Laterality: Left;   EYE SURGERY Bilateral    cataract surgery   FINGER ARTHROPLASTY Left    left thumb-Dr. Amanda Pea   INGUINAL HERNIA REPAIR Left 07/03/2021   Procedure: LEFT INGUINAL HERNIA REPAIR WITH MESH;  Surgeon: Griselda Miner, MD;  Location: [facility] OR;  Service: General;  Laterality: Left;   INSERTION OF MESH N/A 07/03/2021   Procedure: INSERTION OF MESH X2;  Surgeon: Griselda Miner, MD;  Location: [facility] OR;  Service: General;  Laterality: N/A;   IR IMAGING GUIDED PORT INSERTION  03/12/2022   IR RADIOLOGIST EVAL & MGMT  10/25/2019   KNEE ARTHROSCOPY Left    KNEE SURGERY Left    Bakers cyst x2   SHOULDER ARTHROSCOPY Right    thumb surgery     TONSILLECTOMY     TOTAL KNEE ARTHROPLASTY Left 03/19/2016   Procedure: LEFT TOTAL KNEE ARTHROPLASTY;  Surgeon: Eugenia Mcalpine, MD;  Location: [facility] ORS;  Service: Orthopedics;  Laterality: Left;   TOTAL SHOULDER REPLACEMENT Right    UMBILICAL HERNIA REPAIR N/A 07/03/2021   Procedure: UMBILICAL HERNIA REPAIR WITH MESH;  Surgeon: Griselda Miner, MD;  Location: [facility] OR;  Service: General;  Laterality: N/A;   VASCULAR SURGERY     VASECTOMY     VIDEO BRONCHOSCOPY WITH ENDOBRONCHIAL ULTRASOUND Right 02/23/2022   Procedure: VIDEO BRONCHOSCOPY WITH ENDOBRONCHIAL ULTRASOUND;  Surgeon: Leslye Peer, MD;  Location: [facility] ENDOSCOPY;  Service: Pulmonary;  Laterality: Right;   VIDEO BRONCHOSCOPY WITH RADIAL ENDOBRONCHIAL ULTRASOUND  02/23/2022   Procedure: VIDEO BRONCHOSCOPY WITH RADIAL  ENDOBRONCHIAL ULTRASOUND;  Surgeon: Leslye Peer, MD;  Location: [facility] ENDOSCOPY;  Service: Pulmonary;;    REVIEW OF SYSTEMS:   Constitutional: Positive for stable fatigue.Negative for appetite change, chills, fever and unexpected weight change.  HENT: Negative for mouth sores, nosebleeds, sore throat and trouble swallowing.   Eyes: Negative for eye problems and icterus.  Respiratory: Positive  for shortness of breath in the humidity (stable).  Negative for cough, hemoptysis, and wheezing.   Cardiovascular: Negative for chest pain and leg swelling.  Gastrointestinal: Positive for discomfort near right inguinal hernia. Negative for constipation, diarrhea, nausea and vomiting.  Genitourinary: Negative for bladder incontinence, difficulty urinating, dysuria, frequency and hematuria.   Musculoskeletal: Positive for chronic back pain with some radiation down his right leg occassionally.  Negative for gait problem, neck pain and neck stiffness.  Skin: Negative for itching and rash.  Neurological: Negative for dizziness, extremity weakness, gait problem, headaches, light-headedness and seizures.  Hematological: Negative for adenopathy. Does not bruise/bleed easily.  Psychiatric/Behavioral: Negative for confusion, depression and sleep disturbance. The patient is not nervous/anxious.   PHYSICAL EXAMINATION:  There were no vitals taken for this visit.  ECOG PERFORMANCE STATUS: 1  Physical Exam  Constitutional: Oriented to person, place, and time and well-developed, well-nourished, and in no distress.  HENT:  Head: Normocephalic and atraumatic.  Mouth/Throat: Oropharynx is clear and moist. No oropharyngeal exudate.  Eyes: Conjunctivae are normal. Right eye exhibits no discharge. Left eye exhibits no discharge. No scleral icterus.  Neck: Normal range of motion. Neck supple.  Cardiovascular: Normal rate, regular rhythm, normal heart sounds and intact distal pulses.   Pulmonary/Chest: Effort normal  and breath sounds normal. No respiratory distress. No wheezes. No rales.  Abdominal: Soft. Right inguinal hernia. No significant tenderness to palpation. Bowel sounds are normal. Exhibits no distension and no mass. There is no tenderness.  Musculoskeletal: Normal range of motion. Exhibits no edema.  Lymphadenopathy:    No cervical adenopathy.  Neurological: Alert and oriented to person, place, and time. Exhibits normal muscle tone. Gait normal. Coordination normal.  Skin: Skin is warm and dry. No rash noted. Not diaphoretic. No erythema. No pallor.  Psychiatric: Mood, memory and judgment normal.  Vitals reviewed.    LABORATORY DATA: Lab Results  Component Value Date   WBC 4.4 02/01/2023   HGB 12.1 (L) 02/01/2023   HCT 36.6 (L) 02/01/2023   MCV 99.5 02/01/2023   PLT 170 02/01/2023      Chemistry      Component Value Date/Time   NA 138 02/01/2023 1031   NA 138 02/03/2021 0944   K 3.9 02/01/2023 1031   CL 106 02/01/2023 1031   CO2 27 02/01/2023 1031   BUN 14 02/01/2023 1031   BUN 13 02/03/2021 0944   CREATININE 0.84 02/01/2023 1031      Component Value Date/Time   CALCIUM 9.0 02/01/2023 1031   ALKPHOS 68 02/01/2023 1031   AST 20 02/01/2023 1031   ALT 16 02/01/2023 1031   BILITOT 0.5 02/01/2023 1031       RADIOGRAPHIC STUDIES:  No results found.   ASSESSMENT/PLAN:  This is a very pleasant 77 year old Caucasian male with extensive stage (T1c, N2, M1C) small cell lung cancer presented with right lower lobe lung nodule in addition to right hilar and mediastinal lymphadenopathy and bone metastasis involving thoracic spines as well as the right iliac crest diagnosed in July 2023.    The patient started systemic chemotherapy with carboplatin for AUC of 5 on day 1, etoposide 100 Mg/M2 on days 1, 2 and 3 as well as Cosela 240 Mg/M2 on the days of the chemotherapy and Imfinzi 1500 Mg IV every 3 weeks with the induction treatment. He was status post 8 cycles. Starting from cycle  #5 the patient was on maintenance treatment with Imfinzi 1500 Mg IV every 4 weeks. Last dose  was given on August 25, 2022 discontinued secondary to disease progression.    He then underwent palliative radiotherapy to the enlarging mediastinal lymphadenopathy under the care of Dr. Basilio Cairo.    The patient tolerated his previous course of systemic chemotherapy as well as the palliative radiation fairly well except for the aching pain in the sternal area after the radiation.    He then had a repeat CT CAP that showed His scan showed interval improvement in the mediastinal and right hilar adenopathy but unfortunately he has developed a new liver lesion worrisome for liver metastasis as well as developing left adrenal mass again worrisome for metastasis.    He started systemic chemotherapy again with carboplatin for AUC of 5 on day 1 and etoposide 100 Mg/M2 on days 1, 2 and 3 with Neulasta support every 3 weeks for 4 cycles.  He was status post 5 cycles but this was discontinued due to evidence of disease progression.   He is now currently on Zepzelca (lurbinectedin) 3.2 Mg/M2 status post 2 cycles. He tolerated the first cycle of his treatment fairly well with no concerning adverse effect except for fatigue.   Labs were reviewed. Recommend he proceed with cycle #3 today as scheduled.   I will arrange for a restaging CT scan of the CAP prior to his next cycle of treatment. This will include his hernia and his lumbar spine that he can share with his ortho provider and surgeon for her next follow up. He knows should he ever develop signs or symptoms of strangulated hernia such as nausea, lack of bowel movement, vomiting, significant pain, etc to seek emergency room evaluation. He had questions about his lab work and what labs would show worsening disease in the liver. I let him know the best way to evaluate this is the upcoming scan but that we monitor his liver enzymes. The estimated date on his scan is 8/6 or  8/7, however, should he ever have worsening symptoms related to his malignancy such as breathing changes, RUQ pain, etc, we can always move this up sooner. He was given the number to radiology scheduling.   For the immunotherapy mediated type 1 diabetes mellitus, he will continue his evaluation and treatment under Dr. Lonzo Cloud.   I asked him to call me if he has any trouble filling his oxycodone and if I need to send this to another pharmacy.   He is going to call his surgeon regarding his hernia. Unclear if he would be a surgical candidate. However, the patient is wanting more information regarding risks involved with his hernia and for a second opinion.    The patient was advised to call immediately if he has any concerning symptoms in the interval. The patient voices understanding of current disease status and treatment options and is in agreement with the current care plan. All questions were answered. The patient knows to call the clinic with any problems, questions or concerns. We can certainly see the patient much sooner if necessary.    No orders of the defined types were placed in this encounter.    The total time spent in the appointment was 20-29 minutes   Sharie Amorin L Khamila Bassinger, PA-C 02/18/23

## 2023-02-21 ENCOUNTER — Telehealth: Payer: Self-pay | Admitting: Medical Oncology

## 2023-02-21 ENCOUNTER — Other Ambulatory Visit: Payer: Self-pay | Admitting: Internal Medicine

## 2023-02-21 DIAGNOSIS — G893 Neoplasm related pain (acute) (chronic): Secondary | ICD-10-CM

## 2023-02-21 MED ORDER — OXYCODONE-ACETAMINOPHEN 5-325 MG PO TABS
1.0000 | ORAL_TABLET | Freq: Four times a day (QID) | ORAL | 0 refills | Status: DC | PRN
Start: 2023-02-21 — End: 2023-03-02

## 2023-02-21 MED FILL — Dexamethasone Sodium Phosphate Inj 100 MG/10ML: INTRAMUSCULAR | Qty: 1 | Status: AC

## 2023-02-21 NOTE — Telephone Encounter (Signed)
Pt requests refill oxycodone.

## 2023-02-22 ENCOUNTER — Other Ambulatory Visit: Payer: Self-pay

## 2023-02-22 ENCOUNTER — Other Ambulatory Visit: Payer: Medicare HMO

## 2023-02-22 ENCOUNTER — Inpatient Hospital Stay: Payer: Medicare HMO

## 2023-02-22 ENCOUNTER — Inpatient Hospital Stay: Payer: Medicare HMO | Admitting: Physician Assistant

## 2023-02-22 VITALS — BP 118/71 | HR 70

## 2023-02-22 DIAGNOSIS — Z95828 Presence of other vascular implants and grafts: Secondary | ICD-10-CM

## 2023-02-22 DIAGNOSIS — C7951 Secondary malignant neoplasm of bone: Secondary | ICD-10-CM | POA: Diagnosis not present

## 2023-02-22 DIAGNOSIS — C3431 Malignant neoplasm of lower lobe, right bronchus or lung: Secondary | ICD-10-CM | POA: Diagnosis not present

## 2023-02-22 DIAGNOSIS — Z5111 Encounter for antineoplastic chemotherapy: Secondary | ICD-10-CM | POA: Diagnosis not present

## 2023-02-22 DIAGNOSIS — Z79899 Other long term (current) drug therapy: Secondary | ICD-10-CM | POA: Diagnosis not present

## 2023-02-22 LAB — CBC WITH DIFFERENTIAL (CANCER CENTER ONLY)
Abs Immature Granulocytes: 0.01 10*3/uL (ref 0.00–0.07)
Basophils Absolute: 0 10*3/uL (ref 0.0–0.1)
Basophils Relative: 1 %
Eosinophils Absolute: 0.1 10*3/uL (ref 0.0–0.5)
Eosinophils Relative: 3 %
HCT: 35.2 % — ABNORMAL LOW (ref 39.0–52.0)
Hemoglobin: 12.5 g/dL — ABNORMAL LOW (ref 13.0–17.0)
Immature Granulocytes: 0 %
Lymphocytes Relative: 25 %
Lymphs Abs: 0.9 10*3/uL (ref 0.7–4.0)
MCH: 33.7 pg (ref 26.0–34.0)
MCHC: 35.5 g/dL (ref 30.0–36.0)
MCV: 94.9 fL (ref 80.0–100.0)
Monocytes Absolute: 0.5 10*3/uL (ref 0.1–1.0)
Monocytes Relative: 14 %
Neutro Abs: 2.1 10*3/uL (ref 1.7–7.7)
Neutrophils Relative %: 57 %
Platelet Count: 177 10*3/uL (ref 150–400)
RBC: 3.71 MIL/uL — ABNORMAL LOW (ref 4.22–5.81)
RDW: 13.7 % (ref 11.5–15.5)
WBC Count: 3.6 10*3/uL — ABNORMAL LOW (ref 4.0–10.5)
nRBC: 0 % (ref 0.0–0.2)

## 2023-02-22 LAB — CMP (CANCER CENTER ONLY)
ALT: 14 U/L (ref 0–44)
AST: 17 U/L (ref 15–41)
Albumin: 3.9 g/dL (ref 3.5–5.0)
Alkaline Phosphatase: 66 U/L (ref 38–126)
Anion gap: 5 (ref 5–15)
BUN: 10 mg/dL (ref 8–23)
CO2: 29 mmol/L (ref 22–32)
Calcium: 9.5 mg/dL (ref 8.9–10.3)
Chloride: 105 mmol/L (ref 98–111)
Creatinine: 0.82 mg/dL (ref 0.61–1.24)
GFR, Estimated: >60 mL/min (ref >60)
Glucose, Bld: 119 mg/dL — ABNORMAL HIGH (ref 70–99)
Potassium: 4 mmol/L (ref 3.5–5.1)
Sodium: 139 mmol/L (ref 135–145)
Total Bilirubin: 0.4 mg/dL (ref 0.3–1.2)
Total Protein: 6.1 g/dL — ABNORMAL LOW (ref 6.5–8.1)

## 2023-02-22 MED ORDER — SODIUM CHLORIDE 0.9% FLUSH
10.0000 mL | INTRAVENOUS | Status: DC | PRN
  Administered 2023-02-22: 10 mL

## 2023-02-22 MED ORDER — SODIUM CHLORIDE 0.9 % IV SOLN
3.2000 mg/m2 | Freq: Once | INTRAVENOUS | Status: AC
  Administered 2023-02-22: 5.75 mg via INTRAVENOUS
  Filled 2023-02-22: qty 11.5

## 2023-02-22 MED ORDER — SODIUM CHLORIDE 0.9% FLUSH
10.0000 mL | Freq: Once | INTRAVENOUS | Status: AC
  Administered 2023-02-22: 10 mL

## 2023-02-22 MED ORDER — SODIUM CHLORIDE 0.9 % IV SOLN
10.0000 mg | Freq: Once | INTRAVENOUS | Status: AC
  Administered 2023-02-22: 10 mg via INTRAVENOUS
  Filled 2023-02-22: qty 10

## 2023-02-22 MED ORDER — HEPARIN SOD (PORK) LOCK FLUSH 100 UNIT/ML IV SOLN
500.0000 [IU] | Freq: Once | INTRAVENOUS | Status: AC | PRN
  Administered 2023-02-22: 500 [IU]

## 2023-02-22 MED ORDER — PALONOSETRON HCL INJECTION 0.25 MG/5ML
0.2500 mg | Freq: Once | INTRAVENOUS | Status: AC
  Administered 2023-02-22: 0.25 mg via INTRAVENOUS
  Filled 2023-02-22: qty 5

## 2023-02-22 MED ORDER — SODIUM CHLORIDE 0.9 % IV SOLN: Freq: Once | INTRAVENOUS | Status: AC

## 2023-02-22 NOTE — Patient Instructions (Signed)
[place] [facility]  Discharge Instructions: Thank you for choosing [facility] to provide your oncology and hematology care.   If you have a lab appointment with the Cancer Center, please go directly to the Cancer Center and check in at the registration area.   Wear comfortable clothing and clothing appropriate for easy access to any Portacath or PICC line.   We strive to give you quality time with your provider. You may need to reschedule your appointment if you arrive late (15 or more minutes).  Arriving late affects you and other patients whose appointments are after yours.  Also, if you miss three or more appointments without notifying the office, you may be dismissed from the clinic at the provider's discretion.      For prescription refill requests, have your pharmacy contact our office and allow 72 hours for refills to be completed.    Today you received the following chemotherapy and/or immunotherapy agents: Lurbinectedin      To help prevent nausea and vomiting after your treatment, we encourage you to take your nausea medication as directed.  BELOW ARE SYMPTOMS THAT SHOULD BE REPORTED IMMEDIATELY: *FEVER GREATER THAN 100.4 F (38 C) OR HIGHER *CHILLS OR SWEATING *NAUSEA AND VOMITING THAT IS NOT CONTROLLED WITH YOUR NAUSEA MEDICATION *UNUSUAL SHORTNESS OF BREATH *UNUSUAL BRUISING OR BLEEDING *URINARY PROBLEMS (pain or burning when urinating, or frequent urination) *BOWEL PROBLEMS (unusual diarrhea, constipation, pain near the anus) TENDERNESS IN MOUTH AND THROAT WITH OR WITHOUT PRESENCE OF ULCERS (sore throat, sores in mouth, or a toothache) UNUSUAL RASH, SWELLING OR PAIN  UNUSUAL VAGINAL DISCHARGE OR ITCHING   Items with * indicate a potential emergency and should be followed up as soon as possible or go to the Emergency Department if any problems should occur.  Please show the CHEMOTHERAPY ALERT CARD or IMMUNOTHERAPY ALERT CARD at  check-in to the Emergency Department and triage nurse.  Should you have questions after your visit or need to cancel or reschedule your appointment, please contact [place] [facility]  Dept: [phone]  and follow the prompts.  Office hours are 8:00 a.m. to 4:30 p.m. Monday - Friday. Please note that voicemails left after 4:00 p.m. may not be returned until the following business day.  We are closed weekends and major holidays. You have access to a nurse at all times for urgent questions. Please call the main number to the clinic Dept: [phone] and follow the prompts.   For any non-urgent questions, you may also contact your provider using MyChart. We now offer e-Visits for anyone 50 and older to request care online for non-urgent symptoms. For details visit [email or web address].   Also download the MyChart app! Go to the app store, search "MyChart", open the app, select [place], and log in with your MyChart username and password.

## 2023-03-02 ENCOUNTER — Other Ambulatory Visit: Payer: Self-pay | Admitting: *Deleted

## 2023-03-02 ENCOUNTER — Other Ambulatory Visit: Payer: Self-pay | Admitting: Internal Medicine

## 2023-03-02 DIAGNOSIS — G893 Neoplasm related pain (acute) (chronic): Secondary | ICD-10-CM

## 2023-03-02 MED ORDER — OXYCODONE-ACETAMINOPHEN 5-325 MG PO TABS
1.0000 | ORAL_TABLET | Freq: Four times a day (QID) | ORAL | 0 refills | Status: DC | PRN
Start: 2023-03-02 — End: 2023-03-14

## 2023-03-02 NOTE — Telephone Encounter (Signed)
Patient called and requested refill of oxycodone. He states he takes 3-4 a day as directions say he can take every 6 hours if needed. He last picked up RX w/40 tablets and will be out of medication by Friday. Refill routed to Dr. Arbutus Ped

## 2023-03-09 ENCOUNTER — Encounter: Payer: Self-pay | Admitting: Internal Medicine

## 2023-03-10 ENCOUNTER — Encounter: Payer: Self-pay | Admitting: Internal Medicine

## 2023-03-11 ENCOUNTER — Encounter (HOSPITAL_COMMUNITY): Payer: Self-pay

## 2023-03-11 ENCOUNTER — Ambulatory Visit (HOSPITAL_COMMUNITY)
Admission: RE | Admit: 2023-03-11 | Discharge: 2023-03-11 | Disposition: A | Discharge status: Home / Self Care | Payer: Medicare HMO | Source: Ambulatory Visit | Attending: Physician Assistant | Admitting: Physician Assistant

## 2023-03-11 DIAGNOSIS — C3431 Malignant neoplasm of lower lobe, right bronchus or lung: Secondary | ICD-10-CM | POA: Diagnosis not present

## 2023-03-11 DIAGNOSIS — I700 Atherosclerosis of aorta: Secondary | ICD-10-CM | POA: Diagnosis not present

## 2023-03-11 DIAGNOSIS — C3490 Malignant neoplasm of unspecified part of unspecified bronchus or lung: Secondary | ICD-10-CM | POA: Diagnosis not present

## 2023-03-11 DIAGNOSIS — C7951 Secondary malignant neoplasm of bone: Secondary | ICD-10-CM | POA: Diagnosis not present

## 2023-03-11 MED ORDER — HEPARIN SOD (PORK) LOCK FLUSH 100 UNIT/ML IV SOLN
500.0000 [IU] | Freq: Once | INTRAVENOUS | Status: AC
  Administered 2023-03-11: 500 [IU] via INTRAVENOUS

## 2023-03-11 MED ORDER — IOHEXOL 300 MG/ML  SOLN
100.0000 mL | Freq: Once | INTRAMUSCULAR | Status: AC | PRN
  Administered 2023-03-11: 100 mL via INTRAVENOUS

## 2023-03-11 MED ORDER — HEPARIN SOD (PORK) LOCK FLUSH 100 UNIT/ML IV SOLN
INTRAVENOUS | Status: AC
  Filled 2023-03-11: qty 5

## 2023-03-14 ENCOUNTER — Other Ambulatory Visit: Payer: Self-pay | Admitting: Internal Medicine

## 2023-03-14 ENCOUNTER — Telehealth: Payer: Self-pay | Admitting: Medical Oncology

## 2023-03-14 DIAGNOSIS — G893 Neoplasm related pain (acute) (chronic): Secondary | ICD-10-CM

## 2023-03-14 MED ORDER — OXYCODONE-ACETAMINOPHEN 5-325 MG PO TABS
1.0000 | ORAL_TABLET | Freq: Four times a day (QID) | ORAL | 0 refills | Status: DC | PRN
Start: 2023-03-14 — End: 2023-03-23

## 2023-03-14 MED FILL — Dexamethasone Sodium Phosphate Inj 100 MG/10ML: INTRAMUSCULAR | Qty: 1 | Status: AC

## 2023-03-14 NOTE — Telephone Encounter (Signed)
"   Having a lot of pain" Requests refill for oxycodone.

## 2023-03-15 ENCOUNTER — Inpatient Hospital Stay (HOSPITAL_BASED_OUTPATIENT_CLINIC_OR_DEPARTMENT_OTHER): Payer: Medicare HMO | Admitting: Internal Medicine

## 2023-03-15 ENCOUNTER — Telehealth: Payer: Self-pay | Admitting: Medical Oncology

## 2023-03-15 ENCOUNTER — Inpatient Hospital Stay: Payer: Medicare HMO

## 2023-03-15 ENCOUNTER — Inpatient Hospital Stay: Payer: Medicare HMO | Attending: Internal Medicine

## 2023-03-15 ENCOUNTER — Other Ambulatory Visit: Payer: Self-pay

## 2023-03-15 VITALS — BP 132/77 | HR 78 | Temp 97.7°F | Resp 16

## 2023-03-15 DIAGNOSIS — C787 Secondary malignant neoplasm of liver and intrahepatic bile duct: Secondary | ICD-10-CM | POA: Diagnosis not present

## 2023-03-15 DIAGNOSIS — Z95828 Presence of other vascular implants and grafts: Secondary | ICD-10-CM | POA: Diagnosis not present

## 2023-03-15 DIAGNOSIS — G893 Neoplasm related pain (acute) (chronic): Secondary | ICD-10-CM | POA: Insufficient documentation

## 2023-03-15 DIAGNOSIS — C3431 Malignant neoplasm of lower lobe, right bronchus or lung: Secondary | ICD-10-CM | POA: Insufficient documentation

## 2023-03-15 DIAGNOSIS — Z794 Long term (current) use of insulin: Secondary | ICD-10-CM | POA: Diagnosis not present

## 2023-03-15 DIAGNOSIS — C7951 Secondary malignant neoplasm of bone: Secondary | ICD-10-CM | POA: Diagnosis not present

## 2023-03-15 DIAGNOSIS — E109 Type 1 diabetes mellitus without complications: Secondary | ICD-10-CM | POA: Insufficient documentation

## 2023-03-15 LAB — CMP (CANCER CENTER ONLY)
ALT: 16 U/L (ref 0–44)
AST: 17 U/L (ref 15–41)
Albumin: 4 g/dL (ref 3.5–5.0)
Alkaline Phosphatase: 93 U/L (ref 38–126)
Anion gap: 7 (ref 5–15)
BUN: 15 mg/dL (ref 8–23)
CO2: 27 mmol/L (ref 22–32)
Calcium: 9.4 mg/dL (ref 8.9–10.3)
Chloride: 101 mmol/L (ref 98–111)
Creatinine: 0.91 mg/dL (ref 0.61–1.24)
GFR, Estimated: >60 mL/min (ref >60)
Glucose, Bld: 361 mg/dL — ABNORMAL HIGH (ref 70–99)
Potassium: 4.4 mmol/L (ref 3.5–5.1)
Sodium: 135 mmol/L (ref 135–145)
Total Bilirubin: 0.5 mg/dL (ref 0.3–1.2)
Total Protein: 6.7 g/dL (ref 6.5–8.1)

## 2023-03-15 LAB — CBC WITH DIFFERENTIAL (CANCER CENTER ONLY)
Abs Immature Granulocytes: 0.01 10*3/uL (ref 0.00–0.07)
Basophils Absolute: 0.1 10*3/uL (ref 0.0–0.1)
Basophils Relative: 1 %
Eosinophils Absolute: 0.1 10*3/uL (ref 0.0–0.5)
Eosinophils Relative: 1 %
HCT: 34.6 % — ABNORMAL LOW (ref 39.0–52.0)
Hemoglobin: 12 g/dL — ABNORMAL LOW (ref 13.0–17.0)
Immature Granulocytes: 0 %
Lymphocytes Relative: 16 %
Lymphs Abs: 0.8 10*3/uL (ref 0.7–4.0)
MCH: 32.6 pg (ref 26.0–34.0)
MCHC: 34.7 g/dL (ref 30.0–36.0)
MCV: 94 fL (ref 80.0–100.0)
Monocytes Absolute: 0.6 10*3/uL (ref 0.1–1.0)
Monocytes Relative: 12 %
Neutro Abs: 3.3 10*3/uL (ref 1.7–7.7)
Neutrophils Relative %: 70 %
Platelet Count: 234 10*3/uL (ref 150–400)
RBC: 3.68 MIL/uL — ABNORMAL LOW (ref 4.22–5.81)
RDW: 14.5 % (ref 11.5–15.5)
WBC Count: 4.7 10*3/uL (ref 4.0–10.5)
nRBC: 0 % (ref 0.0–0.2)

## 2023-03-15 MED ORDER — HEPARIN SOD (PORK) LOCK FLUSH 100 UNIT/ML IV SOLN
500.0000 [IU] | Freq: Once | INTRAVENOUS | Status: AC
  Administered 2023-03-15: 500 [IU]

## 2023-03-15 MED ORDER — SODIUM CHLORIDE 0.9% FLUSH
10.0000 mL | Freq: Once | INTRAVENOUS | Status: AC
  Administered 2023-03-15: 10 mL

## 2023-03-15 NOTE — Telephone Encounter (Signed)
Pt referred to [facility].

## 2023-03-15 NOTE — Progress Notes (Signed)
[facility] [phone]   [phone]  OFFICE PROGRESS NOTE  [name], DO [address] [place] [address]  DIAGNOSIS:  1) Extensive stage (T1c, N2, M1C) small cell lung cancer presented with right lower lobe lung nodule in addition to right hilar and mediastinal lymphadenopathy and bone metastasis involving thoracic spines as well as the right iliac crest diagnosed in July 2023. 2) immunotherapy mediated type 1 diabetes mellitus diagnosed in October 2023.  PRIOR THERAPY:  1) Systemic chemotherapy with carboplatin for AUC of 5 on day 1, etoposide 100 Mg/M2 on days 1, 2 and 3 with Imfinzi 1500 Mg IV on day 1 and Cosela 240 Mg/M2 on the days of the chemotherapy as well as Imfinzi 1500 Mg IV on day 1 every 3 weeks the first 4 cycles followed by maintenance treatment every 4 weeks starting from cycle #5.  First dose was giving 03/10/2022.  The patient status post 8 cycles.  Last dose was given on August 25, 2022.  Starting from cycle #5 the patient will be on maintenance treatment with Imfinzi 1500 Mg IV every 4 weeks.  This treatment was discontinued secondary to disease progression. 2) Palliative radiotherapy to the enlarging mediastinal lymphadenopathy under the care of Dr. Basilio Cairo.  3) Systemic chemotherapy again with carboplatin for AUC of 5 on day 1 and etoposide 100 Mg/M2 on days 1, 2 and 3 with Neulasta support.  First dose September 28, 2022.  Status post 5 cycles.   4) Second line systemic chemotherapy with Zepzelca IV every 3 weeks.  First dose on 01/10/2023.  Status post 3 cycles   CURRENT THERAPY: Palliative and hospice care.  INTERVAL HISTORY: Adam Jordan 77 y.o. male returns to the clinic today for follow-up visit.  The patient continues to complain of increasing fatigue and weakness as well as pain in the hip area.  He is currently on oxycodone for his pain management.  He denied having any current chest pain, shortness  of breath, cough or hemoptysis.  He has no nausea, vomiting, diarrhea or constipation.  He has no headache or visual changes.  He lost few pounds recently.  He had repeat CT scan of the chest, abdomen and pelvis performed recently and he is here for evaluation and discussion of his scan results.   MEDICAL HISTORY: Past Medical History:  Diagnosis Date   Anxiety    Arthritis    osteoarthrititis- knees and most joints.   COPD (chronic obstructive pulmonary disease) (HCC)    moderate -no regular use of inhalers- rare use of oxygen as sexual activity   Dyspnea    outside in hot weather and also with pollen   Elevated blood-pressure reading, without diagnosis of hypertension 07/31/2019   Encounter for antineoplastic chemotherapy 03/04/2022   Encounter for antineoplastic immunotherapy 03/04/2022   History of heart artery stent 11/04/2021   Hypercholesterolemia 11/05/2013   Hyperlipidemia    Hypertension    Malaise and fatigue 04/19/2013   Neuromuscular disorder (HCC)    feet   Non-recurrent unilateral inguinal hernia without obstruction or gangrene 07/21/2021   Paroxysmal atrial flutter (HCC) 01/15/2021   Port-A-Cath in place 03/31/2022   Routine history and physical examination of adult 11/05/2013   Shoulder joint pain 08/23/2013   Status post lumbar microdiscectomy 07/31/2019   Thoracoabdominal aneurysm [facility])    s/p FEVAR 4 Vessel TABME 02/19/20 Dr. Michael Boston    ALLERGIES:  is allergic to losartan, atorvastatin calcium, and pollen  extract-tree extract [pollen extract].  MEDICATIONS:  Current Outpatient Medications  Medication Sig Dispense Refill   Alum Hydroxide-Mag Trisilicate (GAVISCON) 80-14.2 MG CHEW Chew 1 tablet by mouth daily as needed (heartburn).     apixaban (ELIQUIS) 5 MG TABS tablet Take 1 tablet by mouth twice daily 180 tablet 1   atorvastatin (LIPITOR) 20 MG tablet Take 20 mg by mouth at bedtime.     blood glucose meter kit and supplies Dispense based on patient and insurance  preference. Use up to four times daily as directed. (FOR ICD-10 E10.9, E11.9). (Patient taking differently: 1 each by Other route See admin instructions. Dispense based on patient and insurance preference. Use up to four times daily as directed. (FOR ICD-10 E10.9, E11.9).) 1 each 0   clonazePAM (KLONOPIN) 1 MG tablet Take 1 tablet (1 mg total) by mouth 2 (two) times daily as needed for anxiety. 60 tablet 5   Continuous Blood Gluc Sensor (DEXCOM G7 SENSOR) MISC 1 Device by Does not apply route as directed. 9 each 3   fluticasone (FLONASE) 50 MCG/ACT nasal spray Place 1 spray into both nostrils 2 (two) times daily as needed for allergies.     insulin aspart (NOVOLOG FLEXPEN) 100 UNIT/ML FlexPen Max daily 30 units (Patient taking differently: Inject 1 Units into the skin 3 (three) times daily with meals. Max daily 30 units) 15 mL 11   insulin glargine (LANTUS) 100 UNIT/ML Solostar Pen Inject 24 Units into the skin daily. 30 mL 3   Insulin Pen Needle 32G X 4 MM MISC 1 Device by Does not apply route in the morning, at noon, in the evening, and at bedtime. 400 each 3   Lancets (ONETOUCH DELICA PLUS LANCET33G) MISC Apply 1 each topically 4 (four) times daily.     lidocaine-prilocaine (EMLA) cream Apply to the Port-A-Cath site 30-60 minutes before chemotherapy (Patient taking differently: Apply 1 Application topically See admin instructions. Apply to the Port-A-Cath site 30-60 minutes before chemotherapy) 30 g 0   meloxicam (MOBIC) 15 MG tablet Take 15 mg by mouth daily as needed for pain.     Multiple Vitamin (MULTIVITAMIN WITH MINERALS) TABS tablet Take 1 tablet by mouth daily. 30 tablet 5   Naphazoline HCl (CLEAR EYES OP) Place 1 drop into both eyes daily.     nitroGLYCERIN (NITROSTAT) 0.4 MG SL tablet Place 1 tablet (0.4 mg total) under the tongue every 5 (five) minutes as needed for chest pain. 25 tablet 6   ONETOUCH ULTRA test strip 1 each by Other route 3 (three) times daily. for testing 300 each 3    oxyCODONE-acetaminophen (PERCOCET/ROXICET) 5-325 MG tablet Take 1 tablet by mouth every 6 (six) hours as needed for severe pain. 40 tablet 0   PROAIR HFA 108 (90 BASE) MCG/ACT inhaler Inhale 2 puffs into the lungs every 6 (six) hours as needed for wheezing or shortness of breath.   2   sildenafil (REVATIO) 20 MG tablet Take 60 mg by mouth daily as needed (ED).     Tiotropium Bromide-Olodaterol (STIOLTO RESPIMAT) 2.5-2.5 MCG/ACT AERS Inhale 2 puffs into the lungs daily. 4 g 0   traZODone (DESYREL) 50 MG tablet Take 50 mg by mouth at bedtime.     triamcinolone cream (KENALOG) 0.1 % Apply 1 Application topically daily.     No current facility-administered medications for this visit.    SURGICAL HISTORY:  Past Surgical History:  Procedure Laterality Date   ABDOMINAL AORTIC ANEURYSM REPAIR  02/06/2020   BRONCHIAL BIOPSY  02/23/2022   Procedure: BRONCHIAL BIOPSIES;  Surgeon: Leslye Peer, MD;  Location[facility] ENDOSCOPY;  Service: Pulmonary;;   BRONCHIAL BRUSHINGS  02/23/2022   Procedure: BRONCHIAL BRUSHINGS;  Surgeon: Leslye Peer, MD;  Location: [facility] ENDOSCOPY;  Service: Pulmonary;;   BRONCHIAL NEEDLE ASPIRATION BIOPSY  02/23/2022   Procedure: BRONCHIAL NEEDLE ASPIRATION BIOPSIES;  Surgeon: Leslye Peer, MD;  Location: [facility] ENDOSCOPY;  Service: Pulmonary;;   BROW LIFT Bilateral 12/11/2021   Procedure: UPPER LID BLEPHAROPLASTY;  Surgeon: Glenna Fellows, MD;  Location: [facility];  Service: Plastics;  Laterality: Bilateral;   CYST REMOVAL LEG Left 07/03/2021   Procedure: EXCISION CYST LEFT BUTTOCK;  Surgeon: Griselda Miner, MD;  Location: [facility] OR;  Service: General;  Laterality: Left;   EYE SURGERY Bilateral    cataract surgery   FINGER ARTHROPLASTY Left    left thumb-Dr. Amanda Pea   INGUINAL HERNIA REPAIR Left 07/03/2021   Procedure: LEFT INGUINAL HERNIA REPAIR WITH MESH;  Surgeon: Griselda Miner, MD;  Location: [facility] OR;  Service: General;  Laterality: Left;   INSERTION OF MESH N/A  07/03/2021   Procedure: INSERTION OF MESH X2;  Surgeon: Griselda Miner, MD;  Location: [facility] OR;  Service: General;  Laterality: N/A;   IR IMAGING GUIDED PORT INSERTION  03/12/2022   IR RADIOLOGIST EVAL & MGMT  10/25/2019   KNEE ARTHROSCOPY Left    KNEE SURGERY Left    Bakers cyst x2   SHOULDER ARTHROSCOPY Right    thumb surgery     TONSILLECTOMY     TOTAL KNEE ARTHROPLASTY Left 03/19/2016   Procedure: LEFT TOTAL KNEE ARTHROPLASTY;  Surgeon: Eugenia Mcalpine, MD;  Location: [facility] ORS;  Service: Orthopedics;  Laterality: Left;   TOTAL SHOULDER REPLACEMENT Right    UMBILICAL HERNIA REPAIR N/A 07/03/2021   Procedure: UMBILICAL HERNIA REPAIR WITH MESH;  Surgeon: Griselda Miner, MD;  Location: [facility] OR;  Service: General;  Laterality: N/A;   VASCULAR SURGERY     VASECTOMY     VIDEO BRONCHOSCOPY WITH ENDOBRONCHIAL ULTRASOUND Right 02/23/2022   Procedure: VIDEO BRONCHOSCOPY WITH ENDOBRONCHIAL ULTRASOUND;  Surgeon: Leslye Peer, MD;  Location: [facility] ENDOSCOPY;  Service: Pulmonary;  Laterality: Right;   VIDEO BRONCHOSCOPY WITH RADIAL ENDOBRONCHIAL ULTRASOUND  02/23/2022   Procedure: VIDEO BRONCHOSCOPY WITH RADIAL ENDOBRONCHIAL ULTRASOUND;  Surgeon: Leslye Peer, MD;  Location: [facility] ENDOSCOPY;  Service: Pulmonary;;    REVIEW OF SYSTEMS:  Constitutional: positive for fatigue and weight loss Eyes: negative Ears, nose, mouth, throat, and face: negative Respiratory: positive for dyspnea on exertion Cardiovascular: negative Gastrointestinal: negative Genitourinary:negative Integument/breast: negative Hematologic/lymphatic: negative Musculoskeletal:positive for bone pain Neurological: negative Behavioral/Psych: negative Endocrine: negative Allergic/Immunologic: negative   PHYSICAL EXAMINATION: General appearance: alert, cooperative, fatigued, and no distress Head: Normocephalic, without obvious abnormality, atraumatic Neck: no adenopathy, no JVD, supple, symmetrical, trachea midline, and thyroid not  enlarged, symmetric, no tenderness/mass/nodules Lymph nodes: Cervical, supraclavicular, and axillary nodes normal. Resp: clear to auscultation bilaterally Back: symmetric, no curvature. ROM normal. No CVA tenderness. Cardio: regular rate and rhythm, S1, S2 normal, no murmur, click, rub or gallop GI: soft, non-tender; bowel sounds normal; no masses,  no organomegaly Extremities: extremities normal, atraumatic, no cyanosis or edema Neurologic: Alert and oriented X 3, normal strength and tone. Normal symmetric reflexes. Normal coordination and gait  ECOG PERFORMANCE STATUS: 1 - Symptomatic but completely ambulatory  Blood pressure 132/77, pulse 78, temperature 97.7 F (36.5 C), temperature source Oral, resp. rate 16, SpO2 98%.  LABORATORY DATA: Lab Results  Component Value Date   WBC 4.7 03/15/2023   HGB 12.0 (L) 03/15/2023   HCT 34.6 (L) 03/15/2023   MCV 94.0 03/15/2023   PLT 234 03/15/2023      Chemistry      Component Value Date/Time   NA 139 02/22/2023 1021   NA 138 02/03/2021 0944   K 4.0 02/22/2023 1021   CL 105 02/22/2023 1021   CO2 29 02/22/2023 1021   BUN 10 02/22/2023 1021   BUN 13 02/03/2021 0944   CREATININE 0.82 02/22/2023 1021      Component Value Date/Time   CALCIUM 9.5 02/22/2023 1021   ALKPHOS 66 02/22/2023 1021   AST 17 02/22/2023 1021   ALT 14 02/22/2023 1021   BILITOT 0.4 02/22/2023 1021       RADIOGRAPHIC STUDIES: CT CHEST ABDOMEN PELVIS W CONTRAST  Result Date: 03/13/2023 CLINICAL DATA:  Metastatic lung cancer restaging, ongoing chemotherapy * Tracking Code: BO * EXAM: CT CHEST, ABDOMEN, AND PELVIS WITH CONTRAST TECHNIQUE: Multidetector CT imaging of the chest, abdomen and pelvis was performed following the standard protocol during bolus administration of intravenous contrast. RADIATION DOSE REDUCTION: This exam was performed according to the departmental dose-optimization program which includes automated exposure control, adjustment of the mA  and/or kV according to patient size and/or use of iterative reconstruction technique. CONTRAST:  OMNIPAQUE IOHEXOL 300 MG/ML  SOLN COMPARISON:  12/22/2022 FINDINGS: CT CHEST FINDINGS Cardiovascular: Severe aortic atherosclerosis. Normal heart size. Three-vessel coronary artery calcifications. No pericardial effusion. Mediastinum/Nodes: No enlarged mediastinal, hilar, or axillary lymph nodes. Thyroid gland, trachea, and esophagus demonstrate no significant findings. Lungs/Pleura: Severe centrilobular and paraseptal emphysema. Similar size, although increased solid character of a spiculated nodule in the peripheral right lower lobe 1.3 x 0.7 cm (series 4, image 105), without significant change of a spiculated nodule just inferiorly measuring 1.6 x 1.0 cm (series 4, image 114). New small nodule in the posterior right lower lobe measuring 0.5 cm (series 4, image 127). Multiple additional new nodules in the anterior right lower lobe including a nodule measuring 0.7 cm (series 4, image 124), as well as tiny nodules along the inferior major fissure (series 4, image 118). No pleural effusion or pneumothorax. Musculoskeletal: No chest wall abnormality. No acute osseous findings. CT ABDOMEN PELVIS FINDINGS Hepatobiliary: Numerous hypodense liver lesions are increased in size, index lesion of the central liver dome measuring 2.4 x 2.3 cm, previously 1.4 x 1.1 cm (series 2, image 59). Additional index lesion of the superior left lobe of the liver, hepatic segment II measures 1.6 x 1.2 cm, previously 1.2 x 0.9 cm (series 2, image 61). No gallstones, gallbladder wall thickening, or biliary dilatation. Pancreas: Unremarkable. No pancreatic ductal dilatation or surrounding inflammatory changes. Spleen: Normal in size without significant abnormality. Adrenals/Urinary Tract: Hypodense left adrenal metastasis is increased in size, measuring 2.0 x 1.9 cm, previously 1.7 x 1.6 cm (series 2, image 68). Kidneys are normal, without  renal calculi, solid lesion, or hydronephrosis. Diffuse thickening of the urinary bladder wall, likely secondary to chronic outlet obstruction. Stomach/Bowel: Stomach is within normal limits. Appendix appears normal. No evidence of bowel wall thickening, distention, or inflammatory changes. Sigmoid diverticulosis. Vascular/Lymphatic: Aortic atherosclerosis. Status post aortobiiliac stent endograft repair of the abdominal aortic aneurysm with intentional snorkel stenting of the aortic branch vessels. No enlarged abdominal or pelvic lymph nodes. Reproductive: No mass or other abnormality. Other: No abdominal wall hernia or abnormality. No ascites. Musculoskeletal: No acute osseous findings. Unchanged, subtly heterogeneous osseous metastasis of the  L4 vertebral body (series 6, image 88). IMPRESSION: 1. Similar size, although increased solid character of a spiculated nodule in the peripheral right lower lobe 1.3 x 0.7 cm, without significant change of a spiculated nodule just inferiorly measuring 1.6 x 1.0 cm. There are however multiple new small nodules in the right lower lobe as well as numerous tiny nodules along the inferior major fissure. Findings are consistent with progression of pulmonary metastatic disease. 2. Numerous hypodense liver lesions are increased in size, consistent with progression of hepatic metastatic disease. 3. Hypodense left adrenal metastasis is slightly increased in size, consistent with progression of adrenal metastatic disease. 4. Unchanged osseous metastasis of L4. 5. Emphysema. 6. Coronary artery disease. Aortic Atherosclerosis (ICD10-I70.0) and Emphysema (ICD10-J43.9). Electronically Signed   By: Jearld Lesch M.D.   On: 03/13/2023 15:30    ASSESSMENT AND PLAN: This is a very pleasant 77 years old white male with extensive stage (T1c, N2, M1C) small cell lung cancer presented with right lower lobe lung nodule in addition to right hilar and mediastinal lymphadenopathy and bone metastasis  involving thoracic spines as well as the right iliac crest diagnosed in July 2023. The patient started systemic chemotherapy with carboplatin for AUC of 5 on day 1, etoposide 100 Mg/M2 on days 1, 2 and 3 as well as Cosela 240 Mg/M2 on the days of the chemotherapy and Imfinzi 1500 Mg IV every 3 weeks with the induction treatment.  He is status post 8 cycles.  Starting from cycle #5 the patient will be on maintenance treatment with Imfinzi 1500 Mg IV every 4 weeks.  Last dose was given on August 25, 2022 discontinued secondary to disease progression. He underwent palliative radiotherapy to the enlarging mediastinal lymphadenopathy under the care of Dr. Basilio Cairo. Repeat imaging studies showed interval improvement in the mediastinal and right hilar adenopathy but unfortunately he has developed a new liver lesion worrisome for liver metastasis as well as developing left adrenal mass again worrisome for metastasis. He started systemic chemotherapy again with carboplatin for AUC of 5 on day 1 and etoposide 100 Mg/M2 on days 1, 2 and 3 with Neulasta support every 3 weeks for 4 cycles.  He is status post 5 cycles.  This treatment was discontinued secondary to disease progression. The patient is currently on treatment with Zepzelca (lurbinectedin) 3.2 Mg/M2 status post 3 cycles.  Last psych was given on February 22, 2023 discontinued secondary to disease progression.  The patient had repeat CT scan of the chest, abdomen and pelvis performed recently.  I personally and independently reviewed the scan images and discussed the result with the patient today. Unfortunately his scan showed significant evidence for disease progression with multiple new small nodules in the right lower lobe as well as numerous tiny nodules along the inferior major fissure in addition to worsening liver metastasis. I discussed with the patient and his future treatment options.  I strongly recommended for him to consider palliative care and hospice  since future treatment options are not very effective except for treatment with [place] which is available only at [facility] and the patient may not be able to receive the treatment since we have a long waiting list and it involves a lot of logistic issues that he may not be able to handle.  He preferred to spend the remaining time of his life with his wife and family and not to do any additional treatment.  He is acceptable of the hospice care at this point. I will see  him on as-needed basis and the patient knows to call immediately if he has any concerning issues. For the pain management he will continue on oxycodone for now and his pain medication will be adjusted by the hospice service as needed. For the immunotherapy mediated type 1 diabetes mellitus, he will continue his evaluation and treatment under Dr. Lonzo Cloud.  The patient voices understanding of current disease status and treatment options and is in agreement with the current care plan.  All questions were answered. The patient knows to call the clinic with any problems, questions or concerns. We can certainly see the patient much sooner if necessary.  The total time spent in the appointment was 30 minutes.  Disclaimer: This note was dictated with voice recognition software. Similar sounding words can inadvertently be transcribed and may not be corrected upon review.

## 2023-03-16 ENCOUNTER — Encounter: Payer: Self-pay | Admitting: Internal Medicine

## 2023-03-17 DIAGNOSIS — E109 Type 1 diabetes mellitus without complications: Secondary | ICD-10-CM | POA: Diagnosis not present

## 2023-03-23 ENCOUNTER — Other Ambulatory Visit: Payer: Self-pay | Admitting: Physician Assistant

## 2023-03-23 ENCOUNTER — Telehealth: Payer: Self-pay | Admitting: Medical Oncology

## 2023-03-23 DIAGNOSIS — G893 Neoplasm related pain (acute) (chronic): Secondary | ICD-10-CM

## 2023-03-23 MED ORDER — OXYCODONE-ACETAMINOPHEN 5-325 MG PO TABS
1.0000 | ORAL_TABLET | ORAL | 0 refills | Status: DC | PRN
Start: 2023-03-23 — End: 2023-03-30

## 2023-03-23 NOTE — Telephone Encounter (Signed)
Pt notified that he can take oxycodone every 4  hours prn pain . Refill sent .

## 2023-03-23 NOTE — Telephone Encounter (Signed)
R hip pain /hernia pain. He says the Percocet relieves his pain for 4 hours then it ramps back up. He is taking it every 6 hours prn.   Hospice has not started . They will wait to see if the surgeon will operate on his hernia.. He sees surgeon Friday Per Dr. Arbutus Ped, I told pt he can take oxycodone every 4 hours prn.

## 2023-03-25 ENCOUNTER — Other Ambulatory Visit: Payer: Self-pay | Admitting: Surgery

## 2023-03-25 DIAGNOSIS — K4090 Unilateral inguinal hernia, without obstruction or gangrene, not specified as recurrent: Secondary | ICD-10-CM | POA: Diagnosis not present

## 2023-03-30 ENCOUNTER — Telehealth: Payer: Self-pay | Admitting: Medical Oncology

## 2023-03-30 ENCOUNTER — Other Ambulatory Visit: Payer: Self-pay | Admitting: Physician Assistant

## 2023-03-30 DIAGNOSIS — G893 Neoplasm related pain (acute) (chronic): Secondary | ICD-10-CM

## 2023-03-30 MED ORDER — OXYCODONE-ACETAMINOPHEN 5-325 MG PO TABS
1.0000 | ORAL_TABLET | ORAL | 0 refills | Status: DC | PRN
Start: 2023-03-30 — End: 2023-04-06

## 2023-03-30 NOTE — Telephone Encounter (Signed)
Refill requested for Oxycodone to [facility] [place].

## 2023-03-31 ENCOUNTER — Encounter: Payer: Self-pay | Admitting: Internal Medicine

## 2023-03-31 DIAGNOSIS — Z95828 Presence of other vascular implants and grafts: Secondary | ICD-10-CM | POA: Diagnosis not present

## 2023-03-31 DIAGNOSIS — I700 Atherosclerosis of aorta: Secondary | ICD-10-CM | POA: Diagnosis not present

## 2023-03-31 DIAGNOSIS — Z01810 Encounter for preprocedural cardiovascular examination: Secondary | ICD-10-CM | POA: Diagnosis not present

## 2023-04-06 ENCOUNTER — Telehealth: Payer: Self-pay | Admitting: Medical Oncology

## 2023-04-06 ENCOUNTER — Other Ambulatory Visit: Payer: Self-pay | Admitting: Physician Assistant

## 2023-04-06 DIAGNOSIS — G893 Neoplasm related pain (acute) (chronic): Secondary | ICD-10-CM

## 2023-04-06 MED ORDER — OXYCODONE-ACETAMINOPHEN 5-325 MG PO TABS
1.0000 | ORAL_TABLET | ORAL | 0 refills | Status: DC | PRN
Start: 2023-04-06 — End: 2023-04-14

## 2023-04-06 NOTE — Telephone Encounter (Signed)
Requests refill for oxycodone.   He is not under hospice now because of possible hernia surgery . Scheduled to see cardiology  first due to abnormal EKG.

## 2023-04-07 ENCOUNTER — Encounter: Payer: Self-pay | Admitting: Internal Medicine

## 2023-04-14 ENCOUNTER — Other Ambulatory Visit: Payer: Self-pay | Admitting: Physician Assistant

## 2023-04-14 ENCOUNTER — Telehealth: Payer: Self-pay | Admitting: *Deleted

## 2023-04-14 DIAGNOSIS — G893 Neoplasm related pain (acute) (chronic): Secondary | ICD-10-CM

## 2023-04-14 MED ORDER — OXYCODONE-ACETAMINOPHEN 5-325 MG PO TABS: 1.0000 | ORAL_TABLET | ORAL | 0 refills | Status: DC | PRN

## 2023-04-14 NOTE — Telephone Encounter (Signed)
Pt called for a refill of oxycodone-acetaminophen 5-325. Needs it sent to [facility] in [place] on [address]

## 2023-04-15 ENCOUNTER — Telehealth: Payer: Self-pay

## 2023-04-15 NOTE — Telephone Encounter (Signed)
Pt called to see if his prescription was sent in , and I let him no that it was, but if its to early refill that pharmacy wont refill until its time. Pt verbalized understanding.   Rondel Jumbo, CMA

## 2023-04-16 DIAGNOSIS — E109 Type 1 diabetes mellitus without complications: Secondary | ICD-10-CM | POA: Diagnosis not present

## 2023-04-18 NOTE — Progress Notes (Unsigned)
Name: Adam Jordan  MRN/ DOB: [id number], 03-30-1946   Age/ Sex: 77 y.o., male    PCP: [name], DO   Reason for Endocrinology Evaluation:  Diabetes Mellitus     Date of Initial Endocrinology Visit: 06/09/2022    PATIENT IDENTIFIER: [name] is a 77 y.o. male with a past medical history of DM, COPD, HTN , Dyslipidemia , small lung ca. (Dx 01/2022). The patient presented for initial endocrinology clinic visit on 06/09/2022 for consultative assistance with his diabetes management.    HPI: Adam Jordan was    Diagnosed with DM 05/2022 Patient was diagnosed with immune mediated diabetes mellitus through oncology while being treated for small cell lung cancer while on chemotherapy   Hemoglobin A1c 10.1%  in 05/2022.   SUBJECTIVE:   During the last visit (10/14/2022): A1c 9.6%     Today (04/18/23):Adam Jordan is here for a follow up on diabetes management. He checks his blood sugars multiple  times daily. The patient has  had hypoglycemic episodes since the last clinic visit, which typically occur after a bolus    He was evaluated by surgery for hernia repair 03/2023 Completed radiation treatment 08/27/2022 for lung ca. with mediastinal lymphadenopathy and bone mets, on systemic chemotherapy that was restarted 09/2022, he continues to be on Zepzelca every 3 weeks  Currently on palliative and hospice care  HOME DIABETES REGIMEN: Lantus 24 units daily Novolog 8 units TIDQAC  Novolog 4 units with a snack  CF : Novolog (BG-130/50)      Statin: yes ACE-I/ARB: No - anaphylactic to Losartan Prior Diabetic Education: no  CONTINUOUS GLUCOSE MONITORING RECORD INTERPRETATION    Dates of Recording: 3/1- 10/14/2022  Sensor description: Dexcom  Results statistics:   CGM use % of time 93  Average and SD 208/82  Time in range    36    %  % Time Above 180 26  % Time above 250 34  % Time Below target 3   Glycemic patterns summary: BG's are at the upper level of normal  during the night and fluctuate drastically during the day  Hyperglycemic episodes postprandial  Hypoglycemic episodes occurred following a bolus  Overnight periods: Stable    DIABETIC COMPLICATIONS: Microvascular complications:  He has chronic neuropathy prior to diagnosis of diabetes Denies: CKD  Last eye exam:  Macrovascular complications:   Denies: CAD, PVD, CVA   PAST HISTORY: Past Medical History:  Past Medical History:  Diagnosis Date   Anxiety    Arthritis    osteoarthrititis- knees and most joints.   COPD (chronic obstructive pulmonary disease) (HCC)    moderate -no regular use of inhalers- rare use of oxygen as sexual activity   Dyspnea    outside in hot weather and also with pollen   Elevated blood-pressure reading, without diagnosis of hypertension 07/31/2019   Encounter for antineoplastic chemotherapy 03/04/2022   Encounter for antineoplastic immunotherapy 03/04/2022   History of heart artery stent 11/04/2021   Hypercholesterolemia 11/05/2013   Hyperlipidemia    Hypertension    Malaise and fatigue 04/19/2013   Neuromuscular disorder (HCC)    feet   Non-recurrent unilateral inguinal hernia without obstruction or gangrene 07/21/2021   Paroxysmal atrial flutter (HCC) 01/15/2021   Port-A-Cath in place 03/31/2022   Routine history and physical examination of adult 11/05/2013   Shoulder joint pain 08/23/2013   Status post lumbar microdiscectomy 07/31/2019   Thoracoabdominal aneurysm (HCC)    s/p FEVAR 4 Vessel TABME 02/19/20 Dr. Pattricia Boss  [facility]   Past Surgical History:  Past Surgical History:  Procedure Laterality Date   ABDOMINAL AORTIC ANEURYSM REPAIR  02/06/2020   BRONCHIAL BIOPSY  02/23/2022   Procedure: BRONCHIAL BIOPSIES;  Surgeon: Leslye Peer, MD;  Location: [facility] ENDOSCOPY;  Service: Pulmonary;;   BRONCHIAL BRUSHINGS  02/23/2022   Procedure: BRONCHIAL BRUSHINGS;  Surgeon: Leslye Peer, MD;  Location: [facility] ENDOSCOPY;  Service: Pulmonary;;   BRONCHIAL NEEDLE  ASPIRATION BIOPSY  02/23/2022   Procedure: BRONCHIAL NEEDLE ASPIRATION BIOPSIES;  Surgeon: Leslye Peer, MD;  Location: [facility] ENDOSCOPY;  Service: Pulmonary;;   BROW LIFT Bilateral 12/11/2021   Procedure: UPPER LID BLEPHAROPLASTY;  Surgeon: Glenna Fellows, MD;  Location: [facility];  Service: Plastics;  Laterality: Bilateral;   CYST REMOVAL LEG Left 07/03/2021   Procedure: EXCISION CYST LEFT BUTTOCK;  Surgeon: Griselda Miner, MD;  Location: [facility] OR;  Service: General;  Laterality: Left;   EYE SURGERY Bilateral    cataract surgery   FINGER ARTHROPLASTY Left    left thumb-Dr. Amanda Pea   INGUINAL HERNIA REPAIR Left 07/03/2021   Procedure: LEFT INGUINAL HERNIA REPAIR WITH MESH;  Surgeon: Griselda Miner, MD;  Location: [facility] OR;  Service: General;  Laterality: Left;   INSERTION OF MESH N/A 07/03/2021   Procedure: INSERTION OF MESH X2;  Surgeon: Griselda Miner, MD;  Location: [facility] OR;  Service: General;  Laterality: N/A;   IR IMAGING GUIDED PORT INSERTION  03/12/2022   IR RADIOLOGIST EVAL & MGMT  10/25/2019   KNEE ARTHROSCOPY Left    KNEE SURGERY Left    Bakers cyst x2   SHOULDER ARTHROSCOPY Right    thumb surgery     TONSILLECTOMY     TOTAL KNEE ARTHROPLASTY Left 03/19/2016   Procedure: LEFT TOTAL KNEE ARTHROPLASTY;  Surgeon: Eugenia Mcalpine, MD;  Location: [facility] ORS;  Service: Orthopedics;  Laterality: Left;   TOTAL SHOULDER REPLACEMENT Right    UMBILICAL HERNIA REPAIR N/A 07/03/2021   Procedure: UMBILICAL HERNIA REPAIR WITH MESH;  Surgeon: Griselda Miner, MD;  Location: [facility] OR;  Service: General;  Laterality: N/A;   VASCULAR SURGERY     VASECTOMY     VIDEO BRONCHOSCOPY WITH ENDOBRONCHIAL ULTRASOUND Right 02/23/2022   Procedure: VIDEO BRONCHOSCOPY WITH ENDOBRONCHIAL ULTRASOUND;  Surgeon: Leslye Peer, MD;  Location: [facility] ENDOSCOPY;  Service: Pulmonary;  Laterality: Right;   VIDEO BRONCHOSCOPY WITH RADIAL ENDOBRONCHIAL ULTRASOUND  02/23/2022   Procedure: VIDEO BRONCHOSCOPY WITH RADIAL  ENDOBRONCHIAL ULTRASOUND;  Surgeon: Leslye Peer, MD;  Location: [facility] ENDOSCOPY;  Service: Pulmonary;;    Social History:  reports that he quit smoking about 4 years ago. His smoking use included cigarettes. He started smoking about 54 years ago. He has never used smokeless tobacco. He reports that he does not currently use alcohol. He reports that he does not use drugs. Family History:  Family History  Problem Relation Age of Onset   Ovarian cancer Mother    Breast cancer Mother    Macular degeneration Mother    Heart attack Father      HOME MEDICATIONS: Allergies as of 04/19/2023       Reactions   Losartan Anaphylaxis   Atorvastatin Calcium    Causes agitation at 40 mg, tolerates 20 mg   Pollen Extract-tree Extract [pollen Extract]         Medication List        Accurate as of April 18, 2023 12:38 PM. If you have any questions, ask your nurse or doctor.  atorvastatin 20 MG tablet Commonly known as: LIPITOR Take 20 mg by mouth at bedtime.   blood glucose meter kit and supplies Dispense based on patient and insurance preference. Use up to four times daily as directed. (FOR ICD-10 E10.9, E11.9). What changed:  how much to take how to take this when to take this   CLEAR EYES OP Place 1 drop into both eyes daily.   clonazePAM 1 MG tablet Commonly known as: KLONOPIN Take 1 tablet (1 mg total) by mouth 2 (two) times daily as needed for anxiety.   Dexcom G7 Sensor Misc 1 Device by Does not apply route as directed.   Eliquis 5 MG Tabs tablet Generic drug: apixaban Take 1 tablet by mouth twice daily   fluticasone 50 MCG/ACT nasal spray Commonly known as: FLONASE Place 1 spray into both nostrils 2 (two) times daily as needed for allergies.   Gaviscon 80-14.2 MG Chew Generic drug: Alum Hydroxide-Mag Trisilicate Chew 1 tablet by mouth daily as needed (heartburn).   insulin glargine 100 UNIT/ML Solostar Pen Commonly known as: LANTUS Inject 24  Units into the skin daily.   Insulin Pen Needle 32G X 4 MM Misc 1 Device by Does not apply route in the morning, at noon, in the evening, and at bedtime.   lidocaine-prilocaine cream Commonly known as: EMLA Apply to the Port-A-Cath site 30-60 minutes before chemotherapy What changed:  how much to take how to take this when to take this   meloxicam 15 MG tablet Commonly known as: MOBIC Take 15 mg by mouth daily as needed for pain.   multivitamin with minerals Tabs tablet Take 1 tablet by mouth daily.   nitroGLYCERIN 0.4 MG SL tablet Commonly known as: NITROSTAT Place 1 tablet (0.4 mg total) under the tongue every 5 (five) minutes as needed for chest pain.   NovoLOG FlexPen 100 UNIT/ML FlexPen Generic drug: insulin aspart Max daily 30 units What changed:  how much to take how to take this when to take this   OneTouch Delica Plus Lancet33G Misc Apply 1 each topically 4 (four) times daily.   OneTouch Ultra test strip Generic drug: glucose blood 1 each by Other route 3 (three) times daily. for testing   oxyCODONE-acetaminophen 5-325 MG tablet Commonly known as: PERCOCET/ROXICET Take 1 tablet by mouth every 4 (four) hours as needed for severe pain.   ProAir HFA 108 (90 Base) MCG/ACT inhaler Generic drug: albuterol Inhale 2 puffs into the lungs every 6 (six) hours as needed for wheezing or shortness of breath.   sildenafil 20 MG tablet Commonly known as: REVATIO Take 60 mg by mouth daily as needed (ED).   Stiolto Respimat 2.5-2.5 MCG/ACT Aers Generic drug: Tiotropium Bromide-Olodaterol Inhale 2 puffs into the lungs daily.   traZODone 50 MG tablet Commonly known as: DESYREL Take 50 mg by mouth at bedtime.   triamcinolone cream 0.1 % Commonly known as: KENALOG Apply 1 Application topically daily.         ALLERGIES: Allergies  Allergen Reactions   Losartan Anaphylaxis   Atorvastatin Calcium     Causes agitation at 40 mg, tolerates 20 mg   Pollen  Extract-Tree Extract [Pollen Extract]      REVIEW OF SYSTEMS: A comprehensive ROS was conducted with the patient and is negative except as per HPI     OBJECTIVE:   VITAL SIGNS: There were no vitals taken for this visit.   PHYSICAL EXAM:  General: Pt appears well and is in NAD  Extremities:  Lower extremities - No pretibial edema.   Neuro: MS is good with appropriate affect, pt is alert and Ox3   DM Foot Exam 10/14/2022   The skin of the feet is without sores or ulcerations, left great toe deformity with onychomycosis  The pedal pulses are 1+ on right and 1+ on left. The sensation is decreased to a screening 5.07, 10 gram monofilament bilaterally  DATA REVIEWED:  Lab Results  Component Value Date   HGBA1C 9.8 (A) 09/16/2022   HGBA1C 10.1 (H) 05/27/2022    Latest Reference Range & Units 03/15/23 10:49  Sodium 135 - 145 mmol/L 135  Potassium 3.5 - 5.1 mmol/L 4.4  Chloride 98 - 111 mmol/L 101  CO2 22 - 32 mmol/L 27  Glucose 70 - 99 mg/dL 161 (H)  BUN 8 - 23 mg/dL 15  Creatinine 0.96 - 0.45 mg/dL 4.09  Calcium 8.9 - 81.1 mg/dL 9.4  Anion gap 5 - 15  7  Alkaline Phosphatase 38 - 126 U/L 93  Albumin 3.5 - 5.0 g/dL 4.0  AST 15 - 41 U/L 17  ALT 0 - 44 U/L 16  Total Protein 6.5 - 8.1 g/dL 6.7  Total Bilirubin 0.3 - 1.2 mg/dL 0.5  GFR, Est Non African American >60 mL/min >60  (H): Data is abnormally high   ASSESSMENT / PLAN / RECOMMENDATIONS:   1) Type 1 Diabetes Mellitus, Poorly controlled - Most recent A1c of 9.6 %. Goal A1c < 7.0 %.     - Bg's have been improving with average BG's have trended down by ~100 points -I have recommended starting metformin -In reviewing Dexcom data, the patient has inconsistent intake of NovoLog at times he will take it appropriately before the meal and at times is after the meal which has resulted in hypoglycemia -I again emphasized the importance of taking his insulin before the meal -I will also adjust his sensitivity factor from 30  to 35  MEDICATIONS: Start metformin 500 mg twice daily Continue Lantus 24 units daily Continue NovoLog to 8 units 3 times daily before every meal Start correction scale : NovoLog (BG -130/35) before each meal  EDUCATION / INSTRUCTIONS: BG monitoring instructions: Patient is instructed to check his blood sugars 3 times a day, before each meal. Call [facility] if: BG persistently < 70  I reviewed the Rule of 15 for the treatment of hypoglycemia in detail with the patient. Literature supplied.   2) Diabetic complications:  Eye: Does not have known diabetic retinopathy.  Neuro/ Feet: Does  have known peripheral neuropathy, prior to his diagnosis of diabetes Renal: Patient does not have known baseline CKD. He is not on an ACEI/ARB at present.    F/U in 4 months  Signed electronically by: Lyndle Herrlich, MD  [facility]  [facility] [address] [place] [place], [place] [address] Phone: [phone] FAX: [phone]   CC: [name], DO [address] [place] [address] Phone: [phone]  Fax: [phone]    Return to Endocrinology clinic as below: Future Appointments  Date Time Provider Department Center  04/19/2023 11:50 AM Kalman Nylen, Konrad Dolores, MD [facility] None  05/23/2023  9:00 AM Georgeanna Lea, MD [facility] None

## 2023-04-19 ENCOUNTER — Encounter: Payer: Self-pay | Admitting: Internal Medicine

## 2023-04-19 ENCOUNTER — Ambulatory Visit (INDEPENDENT_AMBULATORY_CARE_PROVIDER_SITE_OTHER): Payer: Medicare HMO | Admitting: Internal Medicine

## 2023-04-19 VITALS — BP 140/73 | HR 85 | Ht 66.0 in | Wt 147.0 lb

## 2023-04-19 DIAGNOSIS — E1042 Type 1 diabetes mellitus with diabetic polyneuropathy: Secondary | ICD-10-CM | POA: Diagnosis not present

## 2023-04-19 DIAGNOSIS — E1065 Type 1 diabetes mellitus with hyperglycemia: Secondary | ICD-10-CM

## 2023-04-19 LAB — POCT GLYCOSYLATED HEMOGLOBIN (HGB A1C): Hemoglobin A1C: 8.7 % — AB (ref 4.0–5.6)

## 2023-04-19 NOTE — Patient Instructions (Addendum)
Decrease   Lantus 20 units daily  Increase Novolog 10 units with each meal  Novolog correctional insulin: ADD extra units on insulin to your meal-time Humalog dose if your blood sugars are higher than 170. Use the scale below to help guide you:   Blood sugar before meal Number of units to inject  Less than 170 0 unit  171 - 210 1 units  211 - 250 2 units  251 - 290 3 units  291 - 330 4 units  331 - 370 5 units  371 - 410 6 units     HOW TO TREAT LOW BLOOD SUGARS (Blood sugar LESS THAN 70 MG/DL) Please follow the RULE OF 15 for the treatment of hypoglycemia treatment (when your (blood sugars are less than 70 mg/dL)   STEP 1: Take 15 grams of carbohydrates when your blood sugar is low, which includes:  3-4 GLUCOSE TABS  OR 3-4 OZ OF JUICE OR REGULAR SODA OR ONE TUBE OF GLUCOSE GEL    STEP 2: RECHECK blood sugar in 15 MINUTES STEP 3: If your blood sugar is still low at the 15 minute recheck --> then, go back to STEP 1 and treat AGAIN with another 15 grams of carbohydrates.

## 2023-04-25 ENCOUNTER — Telehealth: Payer: Self-pay

## 2023-04-25 NOTE — Telephone Encounter (Signed)
Patient called in needing a refill of his Oxycodone sent to his pharmacy.

## 2023-04-26 ENCOUNTER — Other Ambulatory Visit: Payer: Self-pay

## 2023-04-26 ENCOUNTER — Other Ambulatory Visit: Payer: Self-pay | Admitting: Internal Medicine

## 2023-04-26 ENCOUNTER — Other Ambulatory Visit: Payer: Self-pay | Admitting: Physician Assistant

## 2023-04-26 DIAGNOSIS — G893 Neoplasm related pain (acute) (chronic): Secondary | ICD-10-CM

## 2023-04-26 MED ORDER — OXYCODONE-ACETAMINOPHEN 5-325 MG PO TABS: 1.0000 | ORAL_TABLET | ORAL | 0 refills | Status: DC | PRN

## 2023-04-26 MED ORDER — OXYCODONE-ACETAMINOPHEN 5-325 MG PO TABS
1.0000 | ORAL_TABLET | ORAL | 0 refills | Status: AC | PRN
Start: 2023-04-26 — End: ?

## 2023-04-29 ENCOUNTER — Other Ambulatory Visit: Payer: Self-pay

## 2023-04-29 DIAGNOSIS — E785 Hyperlipidemia, unspecified: Secondary | ICD-10-CM | POA: Insufficient documentation

## 2023-04-29 DIAGNOSIS — I10 Essential (primary) hypertension: Secondary | ICD-10-CM | POA: Insufficient documentation

## 2023-04-29 DIAGNOSIS — F419 Anxiety disorder, unspecified: Secondary | ICD-10-CM | POA: Insufficient documentation

## 2023-04-29 DIAGNOSIS — I716 Thoracoabdominal aortic aneurysm, without rupture, unspecified: Secondary | ICD-10-CM | POA: Insufficient documentation

## 2023-05-01 NOTE — Progress Notes (Unsigned)
Cardiology Office Note:  .   Date:  05/02/2023  ID:  Adam Jordan, DOB [birth year], MRN [id number] PCP: [name], DO  [facility] Providers Cardiologist:  Gypsy Balsam, MD    History of Present Illness: .   Adam Jordan is a 77 y.o. 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  02/24/2021 echo 6065%, mild LVH 02/04/2021 Lexiscan, small defect of mild severity in the apical lateral location, low risk study, no ischemia  Evaluated by Dr. Bing Matter on 09/07/2022, he was maintaining sinus rhythm, blood pressure well-controlled.  He was having periods of what sounded to be angina however typically subsided with rest.  He was advised he can follow-up in 6 months.  He presents today for preoperative evaluation for upcoming hernia repair.  He has stage IV lung cancer, completed chemo, now on palliative care.  He has plans with enrolling under hospice however he would like to have this upcoming hernia repair prior to doing so.  From a cardiac perspective, he is doing well.  He felt previously that he was having episodes of chest pain and was advised to take nitroglycerin, he has since discovered that this was gas and he has had no recurrent symptoms.  He has shortness of breath however this is ongoing and expected. He denies chest pain, palpitations, pnd, orthopnea, n, v, dizziness, syncope, edema, weight gain, or early satiety.   ROS: Review of Systems  Constitutional:  Positive for malaise/fatigue.  Respiratory:  Positive for shortness of breath.   Musculoskeletal:  Positive for back pain, joint pain and myalgias.  All other systems reviewed and are negative.    Studies Reviewed: .        Cardiac Studies & Procedures     STRESS TESTS  MYOCARDIAL PERFUSION IMAGING 02/04/2021  Narrative  The  left ventricular ejection fraction is hyperdynamic (>65%).  Nuclear stress EF: 63%.  There was no ST segment deviation noted during stress.  Defect 1: There is a small defect of mild severity present in the apical lateral location.  This is a low risk study.  No ischemia or MI noted  Fixed defect represents diaphragmatic attenuation.   ECHOCARDIOGRAM  ECHOCARDIOGRAM COMPLETE 02/23/2021  Narrative ECHOCARDIOGRAM REPORT    Patient Name:   Adam Jordan Date of Exam: 02/23/2021 Medical Rec #:  [id number]      Height:       67.0 in Accession #:    [id number]     Weight:       173.0 lb Date of Birth:  Jun 05, 1946     BSA:          1.901 m Patient Age:    74 years       BP:           144/60 mmHg Patient Gender: M              HR:           58 bpm. Exam Location:  [place]  Procedure: 2D Echo  Indications:    Atrial Flutter I48.92  History:        Patient has no prior history of Echocardiogram examinations. COPD, Thoracoabdominal aortic aneurysm; Risk Factors:Hypertension, Dyslipidemia and Tobacco use disorder.  Sonographer:    Louie Boston Referring Phys: [phone] ROBERT J KRASOWSKI  IMPRESSIONS   1. Left  ventricular ejection fraction, by estimation, is 60 to 65%. The left ventricle has normal function. The left ventricle has no regional wall motion abnormalities. There is mild left ventricular hypertrophy. Left ventricular diastolic parameters were normal. 2. Right ventricular systolic function is normal. The right ventricular size is normal. There is normal pulmonary artery systolic pressure. 3. The mitral valve is normal in structure. No evidence of mitral valve regurgitation. No evidence of mitral stenosis. 4. The aortic valve is normal in structure. Aortic valve regurgitation is not visualized. No aortic stenosis is present. 5. There is mild dilatation of the ascending aorta, measuring 39 mm. 6. The inferior vena cava is normal in size with greater than 50% respiratory  variability, suggesting right atrial pressure of 3 mmHg.  FINDINGS Left Ventricle: Left ventricular ejection fraction, by estimation, is 60 to 65%. The left ventricle has normal function. The left ventricle has no regional wall motion abnormalities. The left ventricular internal cavity size was normal in size. There is mild left ventricular hypertrophy. Left ventricular diastolic parameters were normal.  Right Ventricle: The right ventricular size is normal. No increase in right ventricular wall thickness. Right ventricular systolic function is normal. There is normal pulmonary artery systolic pressure. The tricuspid regurgitant velocity is 2.25 m/s, and with an assumed right atrial pressure of 3 mmHg, the estimated right ventricular systolic pressure is 23.2 mmHg.  Left Atrium: Left atrial size was normal in size.  Right Atrium: Right atrial size was normal in size.  Pericardium: There is no evidence of pericardial effusion.  Mitral Valve: The mitral valve is normal in structure. No evidence of mitral valve regurgitation. No evidence of mitral valve stenosis.  Tricuspid Valve: The tricuspid valve is normal in structure. Tricuspid valve regurgitation is mild . No evidence of tricuspid stenosis.  Aortic Valve: The aortic valve is normal in structure. Aortic valve regurgitation is not visualized. No aortic stenosis is present.  Pulmonic Valve: The pulmonic valve was normal in structure. Pulmonic valve regurgitation is not visualized. No evidence of pulmonic stenosis.  Aorta: The aortic root is normal in size and structure. There is mild dilatation of the ascending aorta, measuring 39 mm.  Venous: The inferior vena cava is normal in size with greater than 50% respiratory variability, suggesting right atrial pressure of 3 mmHg.  IAS/Shunts: No atrial level shunt detected by color flow Doppler.   LEFT VENTRICLE PLAX 2D LVIDd:         4.90 cm  Diastology LVIDs:         3.40 cm  LV e'  medial:    5.33 cm/s LV PW:         1.10 cm  LV E/e' medial:  11.6 LV IVS:        1.20 cm  LV e' lateral:   10.20 cm/s LVOT diam:     2.00 cm  LV E/e' lateral: 6.0 LV SV:         62 LV SV Index:   33 LVOT Area:     3.14 cm   RIGHT VENTRICLE             IVC RV S prime:     15.00 cm/s  IVC diam: 1.50 cm TAPSE (M-mode): 2.5 cm  LEFT ATRIUM             Index       RIGHT ATRIUM           Index LA diam:        3.10  cm 1.63 cm/m  RA Area:     15.50 cm LA Vol (A2C):   41.7 ml 21.93 ml/m RA Volume:   40.00 ml  21.04 ml/m LA Vol (A4C):   36.3 ml 19.09 ml/m LA Biplane Vol: 40.8 ml 21.46 ml/m AORTIC VALVE LVOT Vmax:   108.00 cm/s LVOT Vmean:  67.700 cm/s LVOT VTI:    0.197 m  AORTA Ao Root diam: 3.40 cm Ao Asc diam:  3.88 cm Ao Desc diam: 2.30 cm  MITRAL VALVE               TRICUSPID VALVE MV Area (PHT): 2.69 cm    TR Peak grad:   20.2 mmHg MV Decel Time: 282 msec    TR Vmax:        225.00 cm/s MV E velocity: 61.70 cm/s MV A velocity: 72.00 cm/s  SHUNTS MV E/A ratio:  0.86        Systemic VTI:  0.20 m Systemic Diam: 2.00 cm  Gypsy Balsam MD Electronically signed by Gypsy Balsam MD Signature Date/Time: 02/24/2021/12:53:18 PM    Final             Risk Assessment/Calculations:    CHA2DS2-VASc Score = 4   This indicates a 4.8% annual risk of stroke. The patient's score is based upon: CHF History: 0 HTN History: 1 Diabetes History: 1 Stroke History: 0 Vascular Disease History: 0 Age Score: 2 Gender Score: 0            Physical Exam:   VS:  BP 134/60   Pulse 76   Ht 5\' 7"  (1.702 m)   Wt 144 lb 12.8 oz (65.7 kg)   SpO2 93%   BMI 22.68 kg/m    Wt Readings from Last 3 Encounters:  05/02/23 144 lb 12.8 oz (65.7 kg)  04/19/23 147 lb (66.7 kg)  02/22/23 151 lb 9.6 oz (68.8 kg)    GEN: Well nourished, well developed in no acute distress NECK: No JVD; No carotid bruits CARDIAC: RRR, no murmurs, rubs, gallops RESPIRATORY:  Clear to auscultation without  rales, wheezing or rhonchi  ABDOMEN: Soft, non-tender, non-distended EXTREMITIES:  No edema; No deformity   ASSESSMENT AND PLAN: .   PAF/hypercoagulable state-CHA2DS2-VASc score of 4, he is maintaining sinus rhythm today.  Continue Eliquis 5 mg twice daily--no indication for dose reduction. CAD-noted on CT imaging, Stable with no anginal symptoms. No indication for ischemic evaluation.  Currently on Eliquis so we will not start him on aspirin, continue Lipitor 20 mg daily. Dyslipidemia-appears to be monitored by his PCP, continue Lipitor 20 mg daily Hypertension-blood pressure is controlled at 134/60, currently not on an antihypertensive agent. Stage IV lung cancer-followed by Dr. Shirline Frees, completed chemotherapy, enrolled with palliative services and plans to enroll with hospice services as soon as he can get his hernia repaired. Preoperative evaluation-has a hernia that Dr. Magnus Ivan is planning on repairing using a nerve block, he is very eager to get this completed however if we have not received a referral for preoperative evaluation from their office.  He would like to have this completed ASAP as he is aware he only has a short time left, once is completed so he can then enroll in hospice.  I will reach out to them and let them know of the sensitive nature of this as our pharmacist would also need to weigh in on holding his Eliquis.         Dispo: Follow-up in 6 months, sooner if needed.  Signed, Flossie Dibble, NP

## 2023-05-02 ENCOUNTER — Ambulatory Visit: Payer: Medicare HMO | Attending: Cardiology | Admitting: Cardiology

## 2023-05-02 ENCOUNTER — Encounter: Payer: Self-pay | Admitting: Cardiology

## 2023-05-02 VITALS — BP 134/60 | HR 76 | Ht 67.0 in | Wt 144.8 lb

## 2023-05-02 DIAGNOSIS — I2510 Atherosclerotic heart disease of native coronary artery without angina pectoris: Secondary | ICD-10-CM

## 2023-05-02 DIAGNOSIS — I10 Essential (primary) hypertension: Secondary | ICD-10-CM | POA: Diagnosis not present

## 2023-05-02 DIAGNOSIS — Z01810 Encounter for preprocedural cardiovascular examination: Secondary | ICD-10-CM | POA: Diagnosis not present

## 2023-05-02 DIAGNOSIS — D6859 Other primary thrombophilia: Secondary | ICD-10-CM | POA: Diagnosis not present

## 2023-05-02 DIAGNOSIS — I4892 Unspecified atrial flutter: Secondary | ICD-10-CM | POA: Diagnosis not present

## 2023-05-02 DIAGNOSIS — E1069 Type 1 diabetes mellitus with other specified complication: Secondary | ICD-10-CM

## 2023-05-02 DIAGNOSIS — C3431 Malignant neoplasm of lower lobe, right bronchus or lung: Secondary | ICD-10-CM

## 2023-05-02 NOTE — Patient Instructions (Signed)
Medication Instructions:  Your physician recommends that you continue on your current medications as directed. Please refer to the Current Medication list given to you today.  *If you need a refill on your cardiac medications before your next appointment, please call your pharmacy*   Lab Work: NONE If you have labs (blood work) drawn today and your tests are completely normal, you will receive your results only by: MyChart Message (if you have MyChart) OR A paper copy in the mail If you have any lab test that is abnormal or we need to change your treatment, we will call you to review the results.   Testing/Procedures: NONE   Follow-Up: At [facility], you and your health needs are our priority.  As part of our continuing mission to provide you with exceptional heart care, we have created designated Provider Care Teams.  These Care Teams include your primary Cardiologist (physician) and Advanced Practice Providers (APPs -  Physician Assistants and Nurse Practitioners) who all work together to provide you with the care you need, when you need it.  We recommend signing up for the patient portal called "MyChart".  Sign up information is provided on this After Visit Summary.  MyChart is used to connect with patients for Virtual Visits (Telemedicine).  Patients are able to view lab/test results, encounter notes, upcoming appointments, etc.  Non-urgent messages can be sent to your provider as well.   To learn more about what you can do with MyChart, go to [email or web address].    Your next appointment:   6 month(s)  Provider:   Robert Krasowski, MD    Other Instructions   

## 2023-05-04 ENCOUNTER — Telehealth: Payer: Self-pay

## 2023-05-04 ENCOUNTER — Telehealth: Payer: Self-pay | Admitting: Medical Oncology

## 2023-05-04 ENCOUNTER — Other Ambulatory Visit: Payer: Self-pay | Admitting: Internal Medicine

## 2023-05-04 DIAGNOSIS — G893 Neoplasm related pain (acute) (chronic): Secondary | ICD-10-CM

## 2023-05-04 MED ORDER — OXYCODONE-ACETAMINOPHEN 5-325 MG PO TABS: 1.0000 | ORAL_TABLET | ORAL | 0 refills | Status: DC | PRN

## 2023-05-04 NOTE — Telephone Encounter (Signed)
Pharmacy please advise on holding Eliquis prior to hernia surgery scheduled for TBD. Thank you.

## 2023-05-04 NOTE — Telephone Encounter (Signed)
Patient with diagnosis of aflutter on Eliquis for anticoagulation.    Procedure: hernia surgery Date of procedure: TBD  CHA2DS2-VASc Score = 5  This indicates a 7.2% annual risk of stroke. The patient's score is based upon: CHF History: 0 HTN History: 1 Diabetes History: 1 Stroke History: 0 Vascular Disease History: 1 Age Score: 2 Gender Score: 0   CrCl 6mL/min Platelet count 234K  Per office protocol, patient can hold Eliquis for 2-3 days prior to procedure.    **This guidance is not considered finalized until pre-operative APP has relayed final recommendations.**

## 2023-05-04 NOTE — Telephone Encounter (Signed)
Pain Management-new sharp pain in shins and increased pain in left hip and knee. Today ,after one hour,  he needed to take an extra Oxycodone 5/325 so he could get up and walk.  Needs refill and can his pain med rx be modified to help with pain ?  Hernia surgery-His cardiologist cleared him for hernia surgery with nerve block.

## 2023-05-04 NOTE — Telephone Encounter (Signed)
Returned call

## 2023-05-04 NOTE — Telephone Encounter (Signed)
Pre-operative Risk Assessment    Patient Name: Adam Jordan  DOB: 1946/03/25 MRN: [id number]      Request for Surgical Clearance    Procedure:   hernia surgery  Date of Surgery:  Clearance TBD                                 Surgeon:  Dr. Abigail Miyamoto Surgeon's Group or Practice Name:  [facility] Phone number:  [phone] Fax number:  [phone]   Type of Clearance Requested:   - Medical    Type of Anesthesia:  General    Additional requests/questions:    SignedDione Housekeeper   05/04/2023, 2:29 PM

## 2023-05-04 NOTE — Telephone Encounter (Signed)
Patient Name: Adam Jordan  DOB: 1945-09-01 MRN: [id number]  Primary Cardiologist: Gypsy Balsam, MD  Chart reviewed as part of pre-operative protocol coverage. Given past medical history and time since last visit, based on ACC/AHA guidelines, Adam Jordan is at acceptable risk for the planned procedure without further cardiovascular testing.   The patient was advised that if he develops new symptoms prior to surgery to contact our office to arrange for a follow-up visit, and he verbalized understanding.  Per office protocol, patient can hold Eliquis for 2-3 days prior to procedure.   I will route this recommendation to the requesting party via Epic fax function and remove from pre-op pool.  Please call with questions.  Napoleon Form, Leodis Rains, NP 05/04/2023, 5:00 PM

## 2023-05-12 ENCOUNTER — Other Ambulatory Visit: Payer: Self-pay | Admitting: Internal Medicine

## 2023-05-12 ENCOUNTER — Telehealth: Payer: Self-pay | Admitting: Medical Oncology

## 2023-05-12 ENCOUNTER — Other Ambulatory Visit: Payer: Self-pay | Admitting: Medical Oncology

## 2023-05-12 DIAGNOSIS — G893 Neoplasm related pain (acute) (chronic): Secondary | ICD-10-CM

## 2023-05-12 MED ORDER — OXYCODONE-ACETAMINOPHEN 5-325 MG PO TABS
1.0000 | ORAL_TABLET | ORAL | 0 refills | Status: DC | PRN
Start: 2023-05-12 — End: 2023-05-19

## 2023-05-12 NOTE — Progress Notes (Signed)
Pain is not controlled with current pain med . His pain is in his right back and his torso and legs . States " bone pain" . Message sent to [facility].  Pt hernia repair surgery scheduled for 10/16

## 2023-05-12 NOTE — Telephone Encounter (Signed)
Pain management 

## 2023-05-13 ENCOUNTER — Other Ambulatory Visit: Payer: Self-pay

## 2023-05-13 NOTE — Pre-Procedure Instructions (Signed)
Surgical Instructions   Your procedure is scheduled on May 18, 2023. Report to [facility] Main Entrance "A" at 2:00 P.M., then check in with the Admitting office. Any questions or running late day of surgery: call [phone]  Questions prior to your surgery date: call [phone], Monday-Friday, 8am-4pm. If you experience any cold or flu symptoms such as cough, fever, chills, shortness of breath, etc. between now and your scheduled surgery, please notify us at the above number.     Remember:  Do not eat after midnight the night before your surgery   You may drink clear liquids until 1:00 PM the afternoon of your surgery.   Clear liquids allowed are: Water, Non-Citrus Juices (without pulp), Carbonated Beverages, Clear Tea, Black Coffee Only (NO MILK, CREAM OR POWDERED CREAMER of any kind), and Gatorade.    Take these medicines the morning of surgery with A SIP OF WATER: clonazePAM Adam Jordan)    May take these medicines IF NEEDED: fluticasone (FLONASE) nasal spray  Naphazoline HCl (CLEAR EYES OP) eye drops nitroGLYCERIN (NITROSTAT) - please call [phone] if dose taken prior to surgery oxyCODONE-acetaminophen (PERCOCET/ROXICET)  PROAIR HFA 108 (90 BASE) inhaler    STOP taking your apixaban (ELIQUIS) 2-3 days prior to surgery. Your last dose will be either Oct 12th or Oct 13th.   One week prior to surgery, STOP taking any Aspirin (unless otherwise instructed by your surgeon) Aleve, Naproxen, Ibuprofen, Motrin, Advil, Goody's, BC's, all herbal medications, fish oil, and non-prescription vitamins. This includes your medication: meloxicam (MOBIC)    WHAT DO I DO ABOUT MY DIABETES MEDICATION?   THE NIGHT BEFORE SURGERY, take 12 units of insulin glargine (LANTUS).      THE MORNING OF SURGERY, take 12 units of insulin glargine (LANTUS).  If your CBG is greater than 220 mg/dL, you may take  of your insulin aspart (NOVOLOG FLEXPEN).   HOW TO MANAGE YOUR DIABETES BEFORE  AND AFTER SURGERY  Why is it important to control my blood sugar before and after surgery? Improving blood sugar levels before and after surgery helps healing and can limit problems. A way of improving blood sugar control is eating a healthy diet by:  Eating less sugar and carbohydrates  Increasing activity/exercise  Talking with your doctor about reaching your blood sugar goals High blood sugars (greater than 180 mg/dL) can raise your risk of infections and slow your recovery, so you will need to focus on controlling your diabetes during the weeks before surgery. Make sure that the doctor who takes care of your diabetes knows about your planned surgery including the date and location.  How do I manage my blood sugar before surgery? Check your blood sugar at least 4 times a day, starting 2 days before surgery, to make sure that the level is not too high or low.  Check your blood sugar the morning of your surgery when you wake up and every 2 hours until you get to the Short Stay unit.  If your blood sugar is less than 70 mg/dL, you will need to treat for low blood sugar: Do not take insulin. Treat a low blood sugar (less than 70 mg/dL) with  cup of clear juice (cranberry or apple), 4 glucose tablets, OR glucose gel. Recheck blood sugar in 15 minutes after treatment (to make sure it is greater than 70 mg/dL). If your blood sugar is not greater than 70 mg/dL on recheck, call [phone] for further instructions. Report your blood sugar to the short stay nurse when  you get to Short Stay.  If you are admitted to the hospital after surgery: Your blood sugar will be checked by the staff and you will probably be given insulin after surgery (instead of oral diabetes medicines) to make sure you have good blood sugar levels. The goal for blood sugar control after surgery is 80-180 mg/dL.                      Do NOT Smoke (Tobacco/Vaping) for 24 hours prior to your procedure.  If you use a CPAP  at night, you may bring your mask/headgear for your overnight stay.   You will be asked to remove any contacts, glasses, piercing's, hearing aid's, dentures/partials prior to surgery. Please bring cases for these items if needed.    Patients discharged the day of surgery will not be allowed to drive home, and someone needs to stay with them for 24 hours.  SURGICAL WAITING ROOM VISITATION Patients may have no more than 2 support people in the waiting area - these visitors may rotate.   Pre-op nurse will coordinate an appropriate time for 1 ADULT support person, who may not rotate, to accompany patient in pre-op.  Children under the age of 34 must have an adult with them who is not the patient and must remain in the main waiting area with an adult.  If the patient needs to stay at the hospital during part of their recovery, the visitor guidelines for inpatient rooms apply.  Please refer to the [facility] website for the visitor guidelines for any additional information.   If you received a COVID test during your pre-op visit  it is requested that you wear a mask when out in public, stay away from anyone that may not be feeling well and notify your surgeon if you develop symptoms. If you have been in contact with anyone that has tested positive in the last 10 days please notify you surgeon.      Pre-operative CHG Bathing Instructions   You can play a key role in reducing the risk of infection after surgery. Your skin needs to be as free of germs as possible. You can reduce the number of germs on your skin by washing with CHG (chlorhexidine gluconate) soap before surgery. CHG is an antiseptic soap that kills germs and continues to kill germs even after washing.   DO NOT use if you have an allergy to chlorhexidine/CHG or antibacterial soaps. If your skin becomes reddened or irritated, stop using the CHG and notify one of our RNs at [phone].              TAKE A SHOWER THE NIGHT BEFORE  SURGERY AND THE DAY OF SURGERY    Please keep in mind the following:  DO NOT shave, including legs and underarms, 48 hours prior to surgery.   You may shave your face before/day of surgery.  Place clean sheets on your bed the night before surgery Use a clean washcloth (not used since being washed) for each shower. DO NOT sleep with pet's night before surgery.  CHG Shower Instructions:  Wash your face and private area with normal soap. If you choose to wash your hair, wash first with your normal shampoo.  After you use shampoo/soap, rinse your hair and body thoroughly to remove shampoo/soap residue.  Turn the water OFF and apply half the bottle of CHG soap to a CLEAN washcloth.  Apply CHG soap ONLY FROM YOUR NECK DOWN TO  YOUR TOES (washing for 3-5 minutes)  DO NOT use CHG soap on face, private areas, open wounds, or sores.  Pay special attention to the area where your surgery is being performed.  If you are having back surgery, having someone wash your back for you may be helpful. Wait 2 minutes after CHG soap is applied, then you may rinse off the CHG soap.  Pat dry with a clean towel  Put on clean pajamas    Additional instructions for the day of surgery: DO NOT APPLY any lotions, deodorants, cologne, or perfumes.   Do not wear jewelry or makeup Do not wear nail polish, gel polish, artificial nails, or any other type of covering on natural nails (fingers and toes) Do not bring valuables to the hospital. [facility] is not responsible for valuables/personal belongings. Put on clean/comfortable clothes.  Please brush your teeth.  Ask your nurse before applying any prescription medications to the skin.

## 2023-05-16 ENCOUNTER — Encounter (HOSPITAL_COMMUNITY): Payer: Self-pay

## 2023-05-16 ENCOUNTER — Encounter (HOSPITAL_COMMUNITY)
Admission: RE | Admit: 2023-05-16 | Discharge: 2023-05-16 | Disposition: A | Discharge status: Home / Self Care | Payer: Medicare HMO | Source: Ambulatory Visit | Attending: Surgery | Admitting: Surgery

## 2023-05-16 ENCOUNTER — Other Ambulatory Visit: Payer: Self-pay

## 2023-05-16 VITALS — BP 136/72 | HR 86 | Temp 97.9°F | Resp 17 | Ht 67.0 in | Wt 141.3 lb

## 2023-05-16 DIAGNOSIS — I10 Essential (primary) hypertension: Secondary | ICD-10-CM | POA: Insufficient documentation

## 2023-05-16 DIAGNOSIS — K769 Liver disease, unspecified: Secondary | ICD-10-CM | POA: Insufficient documentation

## 2023-05-16 DIAGNOSIS — I2510 Atherosclerotic heart disease of native coronary artery without angina pectoris: Secondary | ICD-10-CM | POA: Diagnosis not present

## 2023-05-16 DIAGNOSIS — C7972 Secondary malignant neoplasm of left adrenal gland: Secondary | ICD-10-CM | POA: Diagnosis not present

## 2023-05-16 DIAGNOSIS — I716 Thoracoabdominal aortic aneurysm, without rupture, unspecified: Secondary | ICD-10-CM | POA: Insufficient documentation

## 2023-05-16 DIAGNOSIS — J439 Emphysema, unspecified: Secondary | ICD-10-CM | POA: Diagnosis not present

## 2023-05-16 DIAGNOSIS — E119 Type 2 diabetes mellitus without complications: Secondary | ICD-10-CM | POA: Insufficient documentation

## 2023-05-16 DIAGNOSIS — C7951 Secondary malignant neoplasm of bone: Secondary | ICD-10-CM | POA: Insufficient documentation

## 2023-05-16 DIAGNOSIS — Z01812 Encounter for preprocedural laboratory examination: Secondary | ICD-10-CM | POA: Diagnosis not present

## 2023-05-16 DIAGNOSIS — E109 Type 1 diabetes mellitus without complications: Secondary | ICD-10-CM | POA: Diagnosis not present

## 2023-05-16 DIAGNOSIS — Z794 Long term (current) use of insulin: Secondary | ICD-10-CM | POA: Insufficient documentation

## 2023-05-16 DIAGNOSIS — I4892 Unspecified atrial flutter: Secondary | ICD-10-CM | POA: Diagnosis not present

## 2023-05-16 HISTORY — DX: Atherosclerotic heart disease of native coronary artery without angina pectoris: I25.10

## 2023-05-16 HISTORY — DX: Cardiac arrhythmia, unspecified: I49.9

## 2023-05-16 HISTORY — DX: Personal history of other diseases of the digestive system: Z87.19

## 2023-05-16 LAB — CBC
HCT: 39.3 % (ref 39.0–52.0)
Hemoglobin: 12.9 g/dL — ABNORMAL LOW (ref 13.0–17.0)
MCH: 30.9 pg (ref 26.0–34.0)
MCHC: 32.8 g/dL (ref 30.0–36.0)
MCV: 94.2 fL (ref 80.0–100.0)
Platelets: 151 10*3/uL (ref 150–400)
RBC: 4.17 MIL/uL — ABNORMAL LOW (ref 4.22–5.81)
RDW: 14.8 % (ref 11.5–15.5)
WBC: 9.2 10*3/uL (ref 4.0–10.5)
nRBC: 0 % (ref 0.0–0.2)

## 2023-05-16 LAB — BASIC METABOLIC PANEL
Anion gap: 12 (ref 5–15)
BUN: 16 mg/dL (ref 8–23)
CO2: 23 mmol/L (ref 22–32)
Calcium: 9.7 mg/dL (ref 8.9–10.3)
Chloride: 98 mmol/L (ref 98–111)
Creatinine, Ser: 0.94 mg/dL (ref 0.61–1.24)
GFR, Estimated: >60 mL/min (ref >60)
Glucose, Bld: 230 mg/dL — ABNORMAL HIGH (ref 70–99)
Potassium: 4.6 mmol/L (ref 3.5–5.1)
Sodium: 133 mmol/L — ABNORMAL LOW (ref 135–145)

## 2023-05-16 LAB — GLUCOSE, CAPILLARY: Glucose-Capillary: 215 mg/dL — ABNORMAL HIGH (ref 70–99)

## 2023-05-16 NOTE — Progress Notes (Deleted)
Pt no show for PAT appointment. Voicemail left with call back number.

## 2023-05-16 NOTE — Progress Notes (Signed)
PCP - [name], DO Cardiologist - Gypsy Balsam - last office visit 05/02/2023  PPM/ICD - Denies Device Orders - n/a Rep Notified - n/a  Chest x-ray - 05/27/2022 EKG - 05/02/2023 Stress Test - 02/04/2021 ECHO - 02/23/2021 Cardiac Cath - Denies  Sleep Study - Denies CPAP - n/a  Pt is DM2. He has a Dexcom CGM on his right arm. He checks his blood sugar multiple times per day. Normal fasting range is around 150. CBG at pre-op was 215 and pt only had black coffee so far today. Last A1c 8.7 on 04/19/2023. Discussed with Antionette Poles, PA-C  Last dose of GLP1 agonist- n/a GLP1 instructions: n/a  Blood Thinner Instructions: Per surgeons instructions, hold Eliquis 2-3 days. Pt last dose was 05/15/23. Aspirin Instructions:n/a  ERAS Protcol - Clear liquids until 1300 day of surgery PRE-SURGERY Ensure or G2- n/a  COVID TEST- n/a   Anesthesia review: Yes. Cardiac Clearance obtained. High CBG at pre-op appointment.   Patient denies shortness of breath, fever, cough and chest pain at PAT appointment. Pt denies any respiratory illness/infection in the last two months.   All instructions explained to the patient, with a verbal understanding of the material. Patient agrees to go over the instructions while at home for a better understanding. Patient also instructed to self quarantine after being tested for COVID-19. The opportunity to ask questions was provided.

## 2023-05-16 NOTE — Pre-Procedure Instructions (Signed)
Surgical Instructions   Your procedure is scheduled on May 18, 2023. Report to [facility] Main Entrance "A" at 2:00 P.M., then check in with the Admitting office. Any questions or running late day of surgery: call [phone]  Questions prior to your surgery date: call [phone], Monday-Friday, 8am-4pm. If you experience any cold or flu symptoms such as cough, fever, chills, shortness of breath, etc. between now and your scheduled surgery, please notify us at the above number.     Remember:  Do not eat after midnight the night before your surgery   You may drink clear liquids until 1:00 PM the afternoon of your surgery.   Clear liquids allowed are: Water, Non-Citrus Juices (without pulp), Carbonated Beverages, Clear Tea, Black Coffee Only (NO MILK, CREAM OR POWDERED CREAMER of any kind), and Gatorade.    Take these medicines the morning of surgery with A SIP OF WATER: clonazePAM Scarlette Calico)    May take these medicines IF NEEDED: fluticasone (FLONASE) nasal spray  Naphazoline HCl (CLEAR EYES OP) eye drops nitroGLYCERIN (NITROSTAT) - please call [phone] if dose taken prior to surgery oxyCODONE-acetaminophen (PERCOCET/ROXICET)  PROAIR HFA 108 (90 BASE) inhaler    STOP taking your apixaban (ELIQUIS) 2-3 days prior to surgery. Your last dose will be either Oct 12th or Oct 13th.   One week prior to surgery, STOP taking any Aspirin (unless otherwise instructed by your surgeon) Aleve, Naproxen, Ibuprofen, Motrin, Advil, Goody's, BC's, all herbal medications, fish oil, and non-prescription vitamins. This includes your medication: meloxicam (MOBIC)    WHAT DO I DO ABOUT MY DIABETES MEDICATION?      THE MORNING OF SURGERY, take 12 units of insulin glargine (LANTUS).  If your CBG is greater than 220 mg/dL, you may take  of your insulin aspart (NOVOLOG FLEXPEN).   HOW TO MANAGE YOUR DIABETES BEFORE AND AFTER SURGERY  Why is it important to control my blood sugar before  and after surgery? Improving blood sugar levels before and after surgery helps healing and can limit problems. A way of improving blood sugar control is eating a healthy diet by:  Eating less sugar and carbohydrates  Increasing activity/exercise  Talking with your doctor about reaching your blood sugar goals High blood sugars (greater than 180 mg/dL) can raise your risk of infections and slow your recovery, so you will need to focus on controlling your diabetes during the weeks before surgery. Make sure that the doctor who takes care of your diabetes knows about your planned surgery including the date and location.  How do I manage my blood sugar before surgery? Check your blood sugar at least 4 times a day, starting 2 days before surgery, to make sure that the level is not too high or low.  Check your blood sugar the morning of your surgery when you wake up and every 2 hours until you get to the Short Stay unit.  If your blood sugar is less than 70 mg/dL, you will need to treat for low blood sugar: Do not take insulin. Treat a low blood sugar (less than 70 mg/dL) with  cup of clear juice (cranberry or apple), 4 glucose tablets, OR glucose gel. Recheck blood sugar in 15 minutes after treatment (to make sure it is greater than 70 mg/dL). If your blood sugar is not greater than 70 mg/dL on recheck, call [phone] for further instructions. Report your blood sugar to the short stay nurse when you get to Short Stay.  If you are admitted to the hospital  after surgery: Your blood sugar will be checked by the staff and you will probably be given insulin after surgery (instead of oral diabetes medicines) to make sure you have good blood sugar levels. The goal for blood sugar control after surgery is 80-180 mg/dL.                      Do NOT Smoke (Tobacco/Vaping) for 24 hours prior to your procedure.  If you use a CPAP at night, you may bring your mask/headgear for your overnight stay.   You  will be asked to remove any contacts, glasses, piercing's, hearing aid's, dentures/partials prior to surgery. Please bring cases for these items if needed.    Patients discharged the day of surgery will not be allowed to drive home, and someone needs to stay with them for 24 hours.  SURGICAL WAITING ROOM VISITATION Patients may have no more than 2 support people in the waiting area - these visitors may rotate.   Pre-op nurse will coordinate an appropriate time for 1 ADULT support person, who may not rotate, to accompany patient in pre-op.  Children under the age of 38 must have an adult with them who is not the patient and must remain in the main waiting area with an adult.  If the patient needs to stay at the hospital during part of their recovery, the visitor guidelines for inpatient rooms apply.  Please refer to the [facility] website for the visitor guidelines for any additional information.   If you received a COVID test during your pre-op visit  it is requested that you wear a mask when out in public, stay away from anyone that may not be feeling well and notify your surgeon if you develop symptoms. If you have been in contact with anyone that has tested positive in the last 10 days please notify you surgeon.      Pre-operative CHG Bathing Instructions   You can play a key role in reducing the risk of infection after surgery. Your skin needs to be as free of germs as possible. You can reduce the number of germs on your skin by washing with CHG (chlorhexidine gluconate) soap before surgery. CHG is an antiseptic soap that kills germs and continues to kill germs even after washing.   DO NOT use if you have an allergy to chlorhexidine/CHG or antibacterial soaps. If your skin becomes reddened or irritated, stop using the CHG and notify one of our RNs at [phone].              TAKE A SHOWER THE NIGHT BEFORE SURGERY AND THE DAY OF SURGERY    Please keep in mind the following:  DO NOT  shave, including legs and underarms, 48 hours prior to surgery.   You may shave your face before/day of surgery.  Place clean sheets on your bed the night before surgery Use a clean washcloth (not used since being washed) for each shower. DO NOT sleep with pet's night before surgery.  CHG Shower Instructions:  Wash your face and private area with normal soap. If you choose to wash your hair, wash first with your normal shampoo.  After you use shampoo/soap, rinse your hair and body thoroughly to remove shampoo/soap residue.  Turn the water OFF and apply half the bottle of CHG soap to a CLEAN washcloth.  Apply CHG soap ONLY FROM YOUR NECK DOWN TO YOUR TOES (washing for 3-5 minutes)  DO NOT use CHG soap on  face, private areas, open wounds, or sores.  Pay special attention to the area where your surgery is being performed.  If you are having back surgery, having someone wash your back for you may be helpful. Wait 2 minutes after CHG soap is applied, then you may rinse off the CHG soap.  Pat dry with a clean towel  Put on clean pajamas    Additional instructions for the day of surgery: DO NOT APPLY any lotions, deodorants, cologne, or perfumes.   Do not wear jewelry or makeup Do not wear nail polish, gel polish, artificial nails, or any other type of covering on natural nails (fingers and toes) Do not bring valuables to the hospital. [facility] is not responsible for valuables/personal belongings. Put on clean/comfortable clothes.  Please brush your teeth.  Ask your nurse before applying any prescription medications to the skin.

## 2023-05-17 NOTE — H&P (Signed)
PROVIDER: Wayne Both, MD  MRN: [id number] DOB: Sep 03, 1945 DATE OF ENCOUNTER: 03/25/2023 Subjective   Chief Complaint: Right inguinal hernia   History of Present Illness: Adam Jordan is a 77 y.o. male who is seen today for a follow-up regarding his right inguinal hernia. He was seen earlier this year by Dr. Carolynne Edouard. Again, he has lung cancer with progression of disease and chemotherapy is no longer an issue. He reports that the right inguinal hernia is getting larger and becoming much more difficult to reduce and causing pain every day. He is interested in hernia repair so he can stay as active as possible. He continues to smoke. He is now only seeing oncology as needed as he has no further options he reports the pain is moderate to severe in the right inguinal area but he has had no nausea or vomiting.    Review of Systems: A complete review of systems was obtained from the patient. I have reviewed this information and discussed as appropriate with the patient. See HPI as well for other ROS.  ROS   Medical History: Past Medical History:  Diagnosis Date  Anxiety  Arthritis  COPD (chronic obstructive pulmonary disease) (CMS/HHS-HCC)   Patient Active Problem List  Diagnosis  Non-recurrent unilateral inguinal hernia without obstruction or gangrene  Umbilical hernia without obstruction or gangrene  Supraumbilical hernia   Past Surgical History:  Procedure Laterality Date  HERNIA REPAIR    Allergies  Allergen Reactions  Losartan Anaphylaxis and Other (See Comments)  Other reaction(s): ANGIOEDEMA Other reaction(s): ANGIOEDEMA   Current Outpatient Medications on File Prior to Visit  Medication Sig Dispense Refill  acetaminophen (TYLENOL) 650 MG ER tablet 2 tablets as needed  albuterol 90 mcg/actuation inhaler 2 PUFFS AS NEEDED INHALATION EVERY 4-6 HRS 30 DAYS  apixaban (ELIQUIS) 5 mg tablet  atorvastatin (LIPITOR) 20 MG tablet Take 1 tablet (20 mg total) by mouth  at bedtime  clonazePAM (KLONOPIN) 1 MG tablet 1 tablet (1 mg total)  fluticasone propionate (FLONASE) 50 mcg/actuation nasal spray Place into one nostril  HYDROcodone-acetaminophen (NORCO) 5-325 mg tablet Take by mouth  meloxicam (MOBIC) 15 MG tablet 1 TABLET ORALLY ONCE A DAY AS NEEDED 90 DAYS  sildenafil (REVATIO) 20 mg tablet TAKE 1 TABLET EVERY DAY AS NEEDED  triamcinolone 0.1 % cream Apply topically 2 (two) times daily  aspirin 81 MG EC tablet 1 tablet (81 mg total) (Patient not taking: Reported on 03/25/2023)  traZODone (DESYREL) 50 MG tablet Take 1 tablet (50 mg total) by mouth at bedtime (Patient not taking: Reported on 03/25/2023)   No current facility-administered medications on file prior to visit.   Family History  Problem Relation Age of Onset  Breast cancer Mother  Myocardial Infarction (Heart attack) Father    Social History   Tobacco Use  Smoking Status Former  Types: Cigarettes  Smokeless Tobacco Never    Social History   Socioeconomic History  Marital status: Married  Tobacco Use  Smoking status: Former  Types: Cigarettes  Smokeless tobacco: Never  Substance and Sexual Activity  Alcohol use: Defer  Drug use: Defer   Social Determinants of Health   Food Insecurity: No Food Insecurity (05/27/2022)  Received from [facility] [place]  Hunger Vital Sign  Worried About Running Out of Food in the Last Year: Never true  Ran Out of Food in the Last Year: Never true  Transportation Needs: No Transportation Needs (01/31/2023)  Received from [facility] - Transportation  Lack of  Transportation (Medical): No  Lack of Transportation (Non-Medical): No   Objective:   Vitals:  03/25/23 1010  Weight: 67.7 kg (149 lb 3.2 oz)  Height: 172.7 cm (5\' 8" )  PainSc: 8  PainLoc: Abdomen   Body mass index is 22.69 kg/m.  Physical Exam   He does not appear in any respiratory distress. He is awake and alert  He has a large, tender, difficult to reduce,  but reducible right inguinal hernia. He also has a very small recurrent umbilical hernia  Labs, Imaging and Diagnostic Testing:  I reviewed his notes in the electronic medical records  Assessment and Plan:   Diagnoses and all orders for this visit:  Non-recurrent unilateral inguinal hernia without obstruction or gangrene    Unfortunately, he has progressive lung cancer. It is also unfortunate that his right inguinal hernia is getting larger and causing him much more discomfort and is now at risk of worsening incarceration or strangulation. He would very much like to proceed with a hernia repair. I would not recommend repair of his umbilical hernia and would only recommend repair of the inguinal hernia which we could attempt with a tap block by anesthesiology and either LMA or MAC anesthesia. I discussed the significant risk of pulmonary issues with surgery. He would need to stop his anticoagulation 2 days before surgery. We would also need cardiac clearance. I explained to him that anesthesia may deem he is not a surgical candidate because of his lungs. I explained proceeding with an open right inguinal hernia repair with mesh. I discussed the risks of bleeding, infection, use of mesh, recurrence, significant pulmonary and cardiac issues postoperatively, etc. We will schedule him for surgery and again he understands his significant risks.

## 2023-05-17 NOTE — Progress Notes (Signed)
Anesthesia Chart Review:  77 year old male follows with cardiology for history of HTN, paroxysmal atrial flutter, thoracoabdominal aneurysm s/p repair 2021, CAD by CT scan.  Echo 01/2021 showed EF 65%, nuclear stress 01/2021 showed small defect of mild severity in the apical lateral location, low risk study, no ischemia.  Last seen by Wallis Bamberg, NP on 05/02/2023, stable at that time, no changes to management.  Surgery was discussed.  Per note, "has a hernia that Dr. Magnus Ivan is planning on repairing using a nerve block, he is very eager to get this completed however if we have not received a referral for preoperative evaluation from their office.  He would like to have this completed ASAP as he is aware he only has a short time left, once is completed so he can then enroll in hospice.  I will reach out to them and let them know of the sensitive nature of this as our pharmacist would also need to weigh in on holding his Eliquis."  Cardiology pharmacist subsequently commented stating patient can hold Eliquis 2 to 3 days prior to procedure.  Patient reports last dose Eliquis 05/15/2023.  History of stage IV lung cancer, s/p chemotherapy and palliative radiotherapy.  Chemo was ultimately discontinued due to disease progression.  Per notes, he plans with enrolling under hospice however he would like to have his hernia repair prior to doing so.  Follows with endocrinologist Dr. Lonzo Cloud for immunotherapy mediated type 1 diabetes.  Last seen 04/19/2023, per notes, goal is to prevent hypoglycemia and severe hyperglycemia.  A1c 8.7 at that time.  Preop labs reviewed, unremarkable.  EKG 05/02/2023: Normal sinus rhythm.  Rate 76. Right bundle branch block  CT chest abdomen pelvis 03/11/2023: IMPRESSION: 1. Similar size, although increased solid character of a spiculated nodule in the peripheral right lower lobe 1.3 x 0.7 cm, without significant change of a spiculated nodule just inferiorly measuring 1.6 x  1.0 cm. There are however multiple new small nodules in the right lower lobe as well as numerous tiny nodules along the inferior major fissure. Findings are consistent with progression of pulmonary metastatic disease. 2. Numerous hypodense liver lesions are increased in size, consistent with progression of hepatic metastatic disease. 3. Hypodense left adrenal metastasis is slightly increased in size, consistent with progression of adrenal metastatic disease. 4. Unchanged osseous metastasis of L4. 5. Emphysema. 6. Coronary artery disease.  TTE 02/23/2021:  1. Left ventricular ejection fraction, by estimation, is 60 to 65%. The  left ventricle has normal function. The left ventricle has no regional  wall motion abnormalities. There is mild left ventricular hypertrophy.  Left ventricular diastolic parameters  were normal.   2. Right ventricular systolic function is normal. The right ventricular  size is normal. There is normal pulmonary artery systolic pressure.   3. The mitral valve is normal in structure. No evidence of mitral valve  regurgitation. No evidence of mitral stenosis.   4. The aortic valve is normal in structure. Aortic valve regurgitation is  not visualized. No aortic stenosis is present.   5. There is mild dilatation of the ascending aorta, measuring 39 mm.   6. The inferior vena cava is normal in size with greater than 50%  respiratory variability, suggesting right atrial pressure of 3 mmHg.   Nuclear stress 02/04/2021: The left ventricular ejection fraction is hyperdynamic (>65%). Nuclear stress EF: 63%. There was no ST segment deviation noted during stress. Defect 1: There is a small defect of mild severity present in the apical lateral  location. This is a low risk study. No ischemia or MI noted Fixed defect represents diaphragmatic attenuation.   Zannie Cove [facility]/Anesthesiology Phone [phone] 05/17/2023 10:34 AM

## 2023-05-17 NOTE — Anesthesia Preprocedure Evaluation (Signed)
Anesthesia Evaluation  Patient identified by MRN, date of birth, ID band Patient awake    Reviewed: Allergy & Precautions, H&P , NPO status , Patient's Chart, lab work & pertinent test results  Airway Mallampati: II  TM Distance: >3 FB Neck ROM: Full    Dental no notable dental hx. (+) Teeth Intact, Dental Advisory Given, Missing   Pulmonary shortness of breath, COPD, Current Smoker and Patient abstained from smoking.   Pulmonary exam normal breath sounds clear to auscultation       Cardiovascular Exercise Tolerance: Good hypertension, + CAD  Normal cardiovascular exam+ dysrhythmias  Rhythm:Regular Rate:Normal     Neuro/Psych  PSYCHIATRIC DISORDERS Anxiety      Neuromuscular disease negative neurological ROS  negative psych ROS   GI/Hepatic negative GI ROS, Neg liver ROS, hiatal hernia,GERD  ,,  Endo/Other  diabetes    Renal/GU Renal diseasenegative Renal ROS  negative genitourinary   Musculoskeletal negative musculoskeletal ROS (+) Arthritis ,    Abdominal   Peds negative pediatric ROS (+)  Hematology negative hematology ROS (+)   Anesthesia Other Findings   Reproductive/Obstetrics negative OB ROS                             Anesthesia Physical Anesthesia Plan  ASA: 4  Anesthesia Plan: General and Regional   Post-op Pain Management: Minimal or no pain anticipated and Regional block*   Induction: Intravenous  PONV Risk Score and Plan: 2 and Ondansetron and Dexamethasone  Airway Management Planned: Oral ETT  Additional Equipment: None  Intra-op Plan:   Post-operative Plan: Extubation in OR  Informed Consent: I have reviewed the patients History and Physical, chart, labs and discussed the procedure including the risks, benefits and alternatives for the proposed anesthesia with the patient or authorized representative who has indicated his/her understanding and acceptance.        Plan Discussed with: Anesthesiologist and CRNA  Anesthesia Plan Comments: (PAT note by Antionette Poles, PA-C:  77 year old male follows with cardiology for history of HTN, paroxysmal atrial flutter, thoracoabdominal aneurysm s/p repair 2021, CAD by CT scan.  Echo 01/2021 showed EF 65%, nuclear stress 01/2021 showed small defect of mild severity in the apical lateral location, low risk study, no ischemia.  Last seen by Wallis Bamberg, NP on 05/02/2023, stable at that time, no changes to management.  Surgery was discussed.  Per note, "has a hernia that Dr. Magnus Ivan is planning on repairing using a nerve block, he is very eager to get this completed however if we have not received a referral for preoperative evaluation from their office.  He would like to have this completed ASAP as he is aware he only has a short time left, once is completed so he can then enroll in hospice.  I will reach out to them and let them know of the sensitive nature of this as our pharmacist would also need to weigh in on holding his Eliquis."  Cardiology pharmacist subsequently commented stating patient can hold Eliquis 2 to 3 days prior to procedure.  Patient reports last dose Eliquis 05/15/2023.  History of stage IV lung cancer, s/p chemotherapy and palliative radiotherapy.  Chemo was ultimately discontinued due to disease progression.  Per notes, he plans with enrolling under hospice however he would like to have his hernia repair prior to doing so.  Follows with endocrinologist Dr. Lonzo Cloud for immunotherapy mediated type 1 diabetes.  Last seen 04/19/2023, per notes, goal is  to prevent hypoglycemia and severe hyperglycemia.  A1c 8.7 at that time.  Preop labs reviewed, unremarkable.  EKG 05/02/2023: Normal sinus rhythm.  Rate 76. Right bundle branch block  CT chest abdomen pelvis 03/11/2023: IMPRESSION: 1. Similar size, although increased solid character of a spiculated nodule in the peripheral right lower lobe 1.3 x  0.7 cm, without significant change of a spiculated nodule just inferiorly measuring 1.6 x 1.0 cm. There are however multiple new small nodules in the right lower lobe as well as numerous tiny nodules along the inferior major fissure. Findings are consistent with progression of pulmonary metastatic disease. 2. Numerous hypodense liver lesions are increased in size, consistent with progression of hepatic metastatic disease. 3. Hypodense left adrenal metastasis is slightly increased in size, consistent with progression of adrenal metastatic disease. 4. Unchanged osseous metastasis of L4. 5. Emphysema. 6. Coronary artery disease.  TTE 02/23/2021: 1. Left ventricular ejection fraction, by estimation, is 60 to 65%. The  left ventricle has normal function. The left ventricle has no regional  wall motion abnormalities. There is mild left ventricular hypertrophy.  Left ventricular diastolic parameters  were normal.  2. Right ventricular systolic function is normal. The right ventricular  size is normal. There is normal pulmonary artery systolic pressure.  3. The mitral valve is normal in structure. No evidence of mitral valve  regurgitation. No evidence of mitral stenosis.  4. The aortic valve is normal in structure. Aortic valve regurgitation is  not visualized. No aortic stenosis is present.  5. There is mild dilatation of the ascending aorta, measuring 39 mm.  6. The inferior vena cava is normal in size with greater than 50%  respiratory variability, suggesting right atrial pressure of 3 mmHg.   Nuclear stress 02/04/2021:  The left ventricular ejection fraction is hyperdynamic (>65%).  Nuclear stress EF: 63%.  There was no ST segment deviation noted during stress.  Defect 1: There is a small defect of mild severity present in the apical lateral location.  This is a low risk study.  No ischemia or MI noted  Fixed defect represents diaphragmatic attenuation.    )         Anesthesia Quick Evaluation

## 2023-05-18 ENCOUNTER — Ambulatory Visit (HOSPITAL_COMMUNITY): Payer: Self-pay | Admitting: Anesthesiology

## 2023-05-18 ENCOUNTER — Other Ambulatory Visit: Payer: Self-pay

## 2023-05-18 ENCOUNTER — Observation Stay (HOSPITAL_COMMUNITY)
Admission: RE | Admit: 2023-05-18 | Discharge: 2023-05-19 | Disposition: A | Discharge status: Home / Self Care | Payer: Medicare HMO | Attending: Surgery | Admitting: Surgery

## 2023-05-18 ENCOUNTER — Encounter (HOSPITAL_COMMUNITY): Payer: Self-pay | Admitting: Surgery

## 2023-05-18 ENCOUNTER — Ambulatory Visit (HOSPITAL_COMMUNITY): Payer: Self-pay | Admitting: Physician Assistant

## 2023-05-18 ENCOUNTER — Encounter (HOSPITAL_COMMUNITY)
Admission: RE | Disposition: A | Discharge status: Home / Self Care | Payer: Self-pay | Source: Home / Self Care | Attending: Surgery

## 2023-05-18 DIAGNOSIS — J449 Chronic obstructive pulmonary disease, unspecified: Secondary | ICD-10-CM | POA: Insufficient documentation

## 2023-05-18 DIAGNOSIS — I2510 Atherosclerotic heart disease of native coronary artery without angina pectoris: Secondary | ICD-10-CM

## 2023-05-18 DIAGNOSIS — Z8719 Personal history of other diseases of the digestive system: Principal | ICD-10-CM

## 2023-05-18 DIAGNOSIS — Z79899 Other long term (current) drug therapy: Secondary | ICD-10-CM | POA: Insufficient documentation

## 2023-05-18 DIAGNOSIS — G893 Neoplasm related pain (acute) (chronic): Secondary | ICD-10-CM

## 2023-05-18 DIAGNOSIS — K4090 Unilateral inguinal hernia, without obstruction or gangrene, not specified as recurrent: Secondary | ICD-10-CM

## 2023-05-18 DIAGNOSIS — Z7901 Long term (current) use of anticoagulants: Secondary | ICD-10-CM | POA: Diagnosis not present

## 2023-05-18 DIAGNOSIS — Z87891 Personal history of nicotine dependence: Secondary | ICD-10-CM | POA: Diagnosis not present

## 2023-05-18 DIAGNOSIS — G8918 Other acute postprocedural pain: Secondary | ICD-10-CM | POA: Diagnosis not present

## 2023-05-18 DIAGNOSIS — Z7982 Long term (current) use of aspirin: Secondary | ICD-10-CM | POA: Diagnosis not present

## 2023-05-18 DIAGNOSIS — E119 Type 2 diabetes mellitus without complications: Secondary | ICD-10-CM

## 2023-05-18 HISTORY — PX: INSERTION OF MESH: SHX5868

## 2023-05-18 HISTORY — PX: INGUINAL HERNIA REPAIR: SHX194

## 2023-05-18 LAB — GLUCOSE, CAPILLARY
Glucose-Capillary: 123 mg/dL — ABNORMAL HIGH (ref 70–99)
Glucose-Capillary: 143 mg/dL — ABNORMAL HIGH (ref 70–99)
Glucose-Capillary: 172 mg/dL — ABNORMAL HIGH (ref 70–99)
Glucose-Capillary: 199 mg/dL — ABNORMAL HIGH (ref 70–99)
Glucose-Capillary: 65 mg/dL — ABNORMAL LOW (ref 70–99)
Glucose-Capillary: 97 mg/dL (ref 70–99)

## 2023-05-18 SURGERY — REPAIR, HERNIA, INGUINAL, ADULT
Anesthesia: Regional | Site: Groin | Laterality: Right

## 2023-05-18 MED ORDER — OXYCODONE HCL 5 MG PO TABS: 5.0000 mg | ORAL_TABLET | Freq: Once | ORAL | Status: DC | PRN

## 2023-05-18 MED ORDER — CHLORHEXIDINE GLUCONATE 0.12 % MT SOLN
OROMUCOSAL | Status: AC
  Administered 2023-05-18: 15 mL via OROMUCOSAL
  Filled 2023-05-18: qty 15

## 2023-05-18 MED ORDER — 0.9 % SODIUM CHLORIDE (POUR BTL) OPTIME
TOPICAL | Status: DC | PRN
  Administered 2023-05-18: 1000 mL

## 2023-05-18 MED ORDER — LACTATED RINGERS IV SOLN: INTRAVENOUS | Status: DC

## 2023-05-18 MED ORDER — CHLORHEXIDINE GLUCONATE CLOTH 2 % EX PADS: 6.0000 | MEDICATED_PAD | Freq: Once | CUTANEOUS | Status: DC

## 2023-05-18 MED ORDER — TRAMADOL HCL 50 MG PO TABS: 50.0000 mg | ORAL_TABLET | Freq: Four times a day (QID) | ORAL | Status: DC | PRN

## 2023-05-18 MED ORDER — CHLORHEXIDINE GLUCONATE 0.12 % MT SOLN: 15.0000 mL | Freq: Once | OROMUCOSAL | Status: AC

## 2023-05-18 MED ORDER — CLONAZEPAM 0.5 MG PO TABS
1.0000 mg | ORAL_TABLET | Freq: Two times a day (BID) | ORAL | Status: DC
  Administered 2023-05-18 – 2023-05-19 (×2): 1 mg via ORAL
  Filled 2023-05-18 (×2): qty 2

## 2023-05-18 MED ORDER — MIDAZOLAM HCL 2 MG/2ML IJ SOLN: 2.0000 mg | Freq: Once | INTRAMUSCULAR | Status: AC

## 2023-05-18 MED ORDER — HYDROMORPHONE HCL 1 MG/ML IJ SOLN: 0.5000 mg | INTRAMUSCULAR | Status: DC | PRN

## 2023-05-18 MED ORDER — FENTANYL CITRATE (PF) 250 MCG/5ML IJ SOLN
INTRAMUSCULAR | Status: AC
  Filled 2023-05-18: qty 5

## 2023-05-18 MED ORDER — FENTANYL CITRATE (PF) 100 MCG/2ML IJ SOLN
100.0000 ug | Freq: Once | INTRAMUSCULAR | Status: AC
  Filled 2023-05-18: qty 2

## 2023-05-18 MED ORDER — FENTANYL CITRATE (PF) 100 MCG/2ML IJ SOLN
25.0000 ug | INTRAMUSCULAR | Status: DC | PRN
  Administered 2023-05-18: 25 ug via INTRAVENOUS

## 2023-05-18 MED ORDER — PROPOFOL 10 MG/ML IV BOLUS
INTRAVENOUS | Status: DC | PRN
  Administered 2023-05-18: 100 mg via INTRAVENOUS

## 2023-05-18 MED ORDER — PROPOFOL 10 MG/ML IV BOLUS
INTRAVENOUS | Status: AC
  Filled 2023-05-18: qty 20

## 2023-05-18 MED ORDER — FENTANYL CITRATE (PF) 100 MCG/2ML IJ SOLN
INTRAMUSCULAR | Status: AC
  Administered 2023-05-18: 25 ug via INTRAVENOUS
  Filled 2023-05-18: qty 2

## 2023-05-18 MED ORDER — BUPIVACAINE-EPINEPHRINE 0.5% -1:200000 IJ SOLN
INTRAMUSCULAR | Status: DC | PRN
  Administered 2023-05-18: 10 mL

## 2023-05-18 MED ORDER — FENTANYL CITRATE (PF) 250 MCG/5ML IJ SOLN
INTRAMUSCULAR | Status: DC | PRN
  Administered 2023-05-18: 50 ug via INTRAVENOUS

## 2023-05-18 MED ORDER — LIDOCAINE 2% (20 MG/ML) 5 ML SYRINGE
INTRAMUSCULAR | Status: AC
  Filled 2023-05-18: qty 5

## 2023-05-18 MED ORDER — ALBUTEROL SULFATE (2.5 MG/3ML) 0.083% IN NEBU: 3.0000 mL | INHALATION_SOLUTION | Freq: Four times a day (QID) | RESPIRATORY_TRACT | Status: DC | PRN

## 2023-05-18 MED ORDER — FENTANYL CITRATE (PF) 100 MCG/2ML IJ SOLN
INTRAMUSCULAR | Status: AC
  Administered 2023-05-18: 100 ug via INTRAVENOUS
  Filled 2023-05-18: qty 2

## 2023-05-18 MED ORDER — ORAL CARE MOUTH RINSE: 15.0000 mL | Freq: Once | OROMUCOSAL | Status: AC

## 2023-05-18 MED ORDER — LIDOCAINE 2% (20 MG/ML) 5 ML SYRINGE
INTRAMUSCULAR | Status: DC | PRN
  Administered 2023-05-18: 100 mg via INTRAVENOUS

## 2023-05-18 MED ORDER — DEXTROSE 50 % IV SOLN: 25.0000 mL | Freq: Once | INTRAVENOUS | Status: AC

## 2023-05-18 MED ORDER — INSULIN ASPART 100 UNIT/ML IJ SOLN
INTRAMUSCULAR | Status: AC
  Administered 2023-05-18: 2 [IU] via SUBCUTANEOUS
  Filled 2023-05-18: qty 1

## 2023-05-18 MED ORDER — ONDANSETRON HCL 4 MG/2ML IJ SOLN
INTRAMUSCULAR | Status: DC | PRN
  Administered 2023-05-18: 4 mg via INTRAVENOUS

## 2023-05-18 MED ORDER — ACETAMINOPHEN 325 MG PO TABS: 325.0000 mg | ORAL_TABLET | ORAL | Status: DC | PRN

## 2023-05-18 MED ORDER — INSULIN ASPART 100 UNIT/ML IJ SOLN: 0.0000 [IU] | INTRAMUSCULAR | Status: DC | PRN

## 2023-05-18 MED ORDER — ONDANSETRON HCL 4 MG/2ML IJ SOLN: 4.0000 mg | Freq: Once | INTRAMUSCULAR | Status: DC | PRN

## 2023-05-18 MED ORDER — BUPIVACAINE-EPINEPHRINE (PF) 0.5% -1:200000 IJ SOLN
INTRAMUSCULAR | Status: AC
  Filled 2023-05-18: qty 30

## 2023-05-18 MED ORDER — OXYCODONE HCL 5 MG/5ML PO SOLN: 5.0000 mg | Freq: Once | ORAL | Status: DC | PRN

## 2023-05-18 MED ORDER — ONDANSETRON HCL 4 MG/2ML IJ SOLN: 4.0000 mg | Freq: Four times a day (QID) | INTRAMUSCULAR | Status: DC | PRN

## 2023-05-18 MED ORDER — DEXTROSE 50 % IV SOLN
INTRAVENOUS | Status: AC
  Administered 2023-05-18: 25 mL via INTRAVENOUS
  Filled 2023-05-18: qty 50

## 2023-05-18 MED ORDER — DEXAMETHASONE SODIUM PHOSPHATE 10 MG/ML IJ SOLN
INTRAMUSCULAR | Status: DC | PRN
  Administered 2023-05-18: 5 mg via INTRAVENOUS

## 2023-05-18 MED ORDER — MIDAZOLAM HCL 2 MG/2ML IJ SOLN
INTRAMUSCULAR | Status: AC
  Administered 2023-05-18: 2 mg via INTRAVENOUS
  Filled 2023-05-18: qty 2

## 2023-05-18 MED ORDER — ONDANSETRON 4 MG PO TBDP: 4.0000 mg | ORAL_TABLET | Freq: Four times a day (QID) | ORAL | Status: DC | PRN

## 2023-05-18 MED ORDER — EPHEDRINE SULFATE-NACL 50-0.9 MG/10ML-% IV SOSY
PREFILLED_SYRINGE | INTRAVENOUS | Status: DC | PRN
  Administered 2023-05-18: 5 mg via INTRAVENOUS
  Administered 2023-05-18 (×2): 10 mg via INTRAVENOUS

## 2023-05-18 MED ORDER — MEPERIDINE HCL 25 MG/ML IJ SOLN: 6.2500 mg | INTRAMUSCULAR | Status: DC | PRN

## 2023-05-18 MED ORDER — KETAMINE HCL 50 MG/5ML IJ SOSY
PREFILLED_SYRINGE | INTRAMUSCULAR | Status: AC
  Filled 2023-05-18: qty 5

## 2023-05-18 MED ORDER — OXYCODONE HCL 5 MG PO TABS
5.0000 mg | ORAL_TABLET | ORAL | Status: DC | PRN
  Administered 2023-05-18: 5 mg via ORAL
  Filled 2023-05-18: qty 1

## 2023-05-18 MED ORDER — ROCURONIUM BROMIDE 10 MG/ML (PF) SYRINGE
PREFILLED_SYRINGE | INTRAVENOUS | Status: AC
  Filled 2023-05-18: qty 10

## 2023-05-18 MED ORDER — ACETAMINOPHEN 500 MG PO TABS
1000.0000 mg | ORAL_TABLET | Freq: Four times a day (QID) | ORAL | Status: DC
  Administered 2023-05-18 – 2023-05-19 (×2): 1000 mg via ORAL
  Filled 2023-05-18 (×2): qty 2

## 2023-05-18 MED ORDER — INSULIN ASPART 100 UNIT/ML IJ SOLN
0.0000 [IU] | INTRAMUSCULAR | Status: DC
  Administered 2023-05-18: 2 [IU] via SUBCUTANEOUS
  Administered 2023-05-18: 1 [IU] via SUBCUTANEOUS
  Administered 2023-05-19: 7 [IU] via SUBCUTANEOUS
  Administered 2023-05-19: 9 [IU] via SUBCUTANEOUS

## 2023-05-18 MED ORDER — CEFAZOLIN SODIUM-DEXTROSE 2-4 GM/100ML-% IV SOLN
2.0000 g | INTRAVENOUS | Status: AC
  Administered 2023-05-18: 2 g via INTRAVENOUS

## 2023-05-18 MED ORDER — BUPIVACAINE HCL (PF) 0.25 % IJ SOLN
INTRAMUSCULAR | Status: DC | PRN
Start: 2023-05-18 — End: 2023-05-18
  Administered 2023-05-18: 30 mL

## 2023-05-18 MED ORDER — NITROGLYCERIN 0.4 MG SL SUBL: 0.4000 mg | SUBLINGUAL_TABLET | SUBLINGUAL | Status: DC | PRN

## 2023-05-18 MED ORDER — ACETAMINOPHEN 160 MG/5ML PO SOLN: 325.0000 mg | ORAL | Status: DC | PRN

## 2023-05-18 MED ORDER — CEFAZOLIN SODIUM-DEXTROSE 2-4 GM/100ML-% IV SOLN
INTRAVENOUS | Status: AC
  Filled 2023-05-18: qty 100

## 2023-05-18 SURGICAL SUPPLY — 37 items
ADH SKN CLS APL DERMABOND .7 (GAUZE/BANDAGES/DRESSINGS) ×1
APL PRP STRL LF DISP 70% ISPRP (MISCELLANEOUS) ×1
BAG COUNTER SPONGE SURGICOUNT (BAG) IMPLANT
BAG SPNG CNTER NS LX DISP (BAG)
BLADE CLIPPER SURG (BLADE) IMPLANT
CHLORAPREP W/TINT 26 (MISCELLANEOUS) ×1 IMPLANT
COVER SURGICAL LIGHT HANDLE (MISCELLANEOUS) ×1 IMPLANT
DERMABOND ADVANCED .7 DNX12 (GAUZE/BANDAGES/DRESSINGS) ×1 IMPLANT
DRAIN PENROSE .5X12 LATEX STL (DRAIN) IMPLANT
DRAPE LAPAROTOMY TRNSV 102X78 (DRAPES) ×1 IMPLANT
ELECT REM PT RETURN 9FT ADLT (ELECTROSURGICAL) ×1
ELECTRODE REM PT RTRN 9FT ADLT (ELECTROSURGICAL) ×1 IMPLANT
GLOVE SURG SIGNA 7.5 PF LTX (GLOVE) ×1 IMPLANT
GOWN STRL REUS W/ TWL LRG LVL3 (GOWN DISPOSABLE) ×1 IMPLANT
GOWN STRL REUS W/ TWL XL LVL3 (GOWN DISPOSABLE) ×1 IMPLANT
GOWN STRL REUS W/TWL LRG LVL3 (GOWN DISPOSABLE) ×1
GOWN STRL REUS W/TWL XL LVL3 (GOWN DISPOSABLE) ×1
KIT BASIN OR (CUSTOM PROCEDURE TRAY) ×1 IMPLANT
KIT TURNOVER KIT B (KITS) ×1 IMPLANT
MESH PARIETEX PROGRIP RIGHT (Mesh General) IMPLANT
NDL HYPO 25GX1X1/2 BEV (NEEDLE) ×1 IMPLANT
NEEDLE HYPO 25GX1X1/2 BEV (NEEDLE) ×1
NS IRRIG 1000ML POUR BTL (IV SOLUTION) ×1 IMPLANT
PACK GENERAL/GYN (CUSTOM PROCEDURE TRAY) ×1 IMPLANT
PAD ARMBOARD 7.5X6 YLW CONV (MISCELLANEOUS) ×1 IMPLANT
SUT MON AB 4-0 PC3 18 (SUTURE) ×1 IMPLANT
SUT SILK 2 0 SH (SUTURE) IMPLANT
SUT VIC AB 2-0 CT1 27 (SUTURE) ×1
SUT VIC AB 2-0 CT1 36 (SUTURE) IMPLANT
SUT VIC AB 2-0 CT1 TAPERPNT 27 (SUTURE) ×1 IMPLANT
SUT VIC AB 3-0 CT1 27 (SUTURE)
SUT VIC AB 3-0 CT1 TAPERPNT 27 (SUTURE) ×1 IMPLANT
SUT VIC AB 3-0 SH 27 (SUTURE) ×1
SUT VIC AB 3-0 SH 27XBRD (SUTURE) IMPLANT
SYR CONTROL 10ML LL (SYRINGE) ×1 IMPLANT
TOWEL GREEN STERILE (TOWEL DISPOSABLE) ×1 IMPLANT
TOWEL GREEN STERILE FF (TOWEL DISPOSABLE) ×1 IMPLANT

## 2023-05-18 NOTE — Progress Notes (Signed)
Hypoglycemic Event  CBG: 65  Treatment: D50 25 mL (12.5 gm)  Symptoms: None  Follow-up CBG: Time:1928 CBG Result:123  Possible Reasons for Event: Inadequate meal intake  Comments/MD notified:Dr Rivka Safer, Thomas Hoff

## 2023-05-18 NOTE — Transfer of Care (Signed)
Immediate Anesthesia Transfer of Care Note  Patient: Adam Jordan  Procedure(s) Performed: OPEN RIGHT INGUINAL HERNIA REPAIR WITH MESH (Right: Groin) INSERTION OF MESH (Right: Groin)  Patient Location: PACU  Anesthesia Type:General  Level of Consciousness: drowsy  Airway & Oxygen Therapy: Patient Spontanous Breathing and Patient connected to face mask oxygen  Post-op Assessment: Report given to RN and Post -op Vital signs reviewed and stable  Post vital signs: Reviewed and stable  Last Vitals:  Vitals Value Taken Time  BP 106/59 05/18/23 1831  Temp    Pulse 71 05/18/23 1834  Resp 16 05/18/23 1834  SpO2 99 % 05/18/23 1834  Vitals shown include unfiled device data.  Last Pain:  Vitals:   05/18/23 1505  PainSc: 0-No pain      Patients Stated Pain Goal: 0 (05/18/23 1422)  Complications: No notable events documented.

## 2023-05-18 NOTE — Anesthesia Procedure Notes (Signed)
Procedure Name: LMA Insertion Date/Time: 05/18/2023 5:49 PM  Performed by: Thomasene Ripple, CRNAPre-anesthesia Checklist: Patient identified, Emergency Drugs available, Suction available and Patient being monitored Patient Re-evaluated:Patient Re-evaluated prior to induction Oxygen Delivery Method: Circle System Utilized Preoxygenation: Pre-oxygenation with 100% oxygen Induction Type: IV induction Ventilation: Mask ventilation without difficulty LMA: LMA inserted LMA Size: 4.0 Number of attempts: 1 Airway Equipment and Method: Bite block Placement Confirmation: positive ETCO2 Tube secured with: Tape Dental Injury: Teeth and Oropharynx as per pre-operative assessment

## 2023-05-18 NOTE — Interval H&P Note (Signed)
History and Physical Interval Note:no change in H and P  05/18/2023 3:57 PM  Adam Jordan  has presented today for surgery, with the diagnosis of RIGHT INGUINAL HERNIA.  The various methods of treatment have been discussed with the patient and family. After consideration of risks, benefits and other options for treatment, the patient has consented to  Procedure(s): OPEN RIGHT INGUINAL HERNIA REPAIR WITH MESH (Right) as a surgical intervention.  The patient's history has been reviewed, patient examined, no change in status, stable for surgery.  I have reviewed the patient's chart and labs.  Questions were answered to the patient's satisfaction.     Abigail Miyamoto

## 2023-05-18 NOTE — Anesthesia Procedure Notes (Signed)
Anesthesia Regional Block: TAP block   Pre-Anesthetic Checklist: , timeout performed,  Correct Patient, Correct Site, Correct Laterality,  Correct Procedure, Correct Position, site marked,  Risks and benefits discussed,  Surgical consent,  Pre-op evaluation,  At surgeon's request and post-op pain management  Laterality: Right  Prep: chloraprep       Needles:  Injection technique: Single-shot  Needle Type: Echogenic Stimulator Needle     Needle Length: 5cm  Needle Gauge: 22     Additional Needles:   Procedures:,,,, ultrasound used (permanent image in chart),,    Narrative:  Start time: 05/18/2023 3:05 PM End time: 05/18/2023 3:11 PM Injection made incrementally with aspirations every 5 mL.  Performed by: Personally  Anesthesiologist: Bethena Midget, MD  Additional Notes: Functioning IV was confirmed and monitors were applied.  A 50mm 22ga Arrow echogenic stimulator needle was used. Sterile prep and drape,hand hygiene and sterile gloves were used. Ultrasound guidance: relevant anatomy identified, needle position confirmed, local anesthetic spread visualized around nerve(s)., vascular puncture avoided.  Image printed for medical record. Negative aspiration and negative test dose prior to incremental administration of local anesthetic. The patient tolerated the procedure well.

## 2023-05-18 NOTE — Op Note (Signed)
OPEN RIGHT INGUINAL HERNIA REPAIR WITH MESH, INSERTION OF MESH  Procedure Note  Adam Jordan 05/18/2023   Pre-op Diagnosis: RIGHT INGUINAL HERNIA     Post-op Diagnosis: same  Procedure(s): OPEN RIGHT INGUINAL HERNIA REPAIR WITH MESH INSERTION OF MESH  Surgeon(s): Abigail Miyamoto, MD  Anesthesia: Choice  Staff:  Circulator: Tawny Hopping, RN Scrub Person: Dorris Carnes  Estimated Blood Loss: Minimal               Findings: The patient was found to have an indirect right inguinal hernia which was repaired with a large piece of Prolene ProGrip mesh from [employer]  Procedure: The patient was brought to the operating identifies the correct patient.  He was placed upon on the operating table and general anesthesia was induced.  His abdomen was then prepped and draped in the usual sterile fashion.  I anesthetized the skin in the right inguinal area with Marcaine.  I then made a longitudinal incision with a scalpel.  I dissected down through Scarpa's fascia with the cautery.  I then opened the external oblique fascia toward the internal and external rings.  I controlled the testicular cord and structures with a Penrose drain.  The patient had an indirect hernia sac which I was able to reduce from the cord structures.  All contents had been reduced.  I was able to tie off the base of the sac with a 2-0 silk suture.  I then excised the redundant sac.  A piece of large Prolene ProGrip mesh was brought to the field.  It was placed against the pubic tubercle and then brought around the cord structures and over the internal ring and then back toward the pubis.   I sutured the mesh to the pubic tubercle with a 2-0 Vicryl suture.  Wide coverage of the inguinal floor and internal ring appeared to be achieved.  Hemostasis also appeared to be achieved.  I closed the external oblique fascia over the top of the mesh with a running 2-0 Vicryl suture.  I then closed Scarpa's fascia with interrupted 3-0  Vicryl sutures and closed the skin with a running 4-0 Monocryl.  Dermabond was then applied.  The patient tolerated the procedure well.  All the counts were correct at the end of the procedure.  The patient was then extubated in the operating room and taken in a stable condition to the recovery room.          Abigail Miyamoto   Date: 05/18/2023  Time: 6:21 PM

## 2023-05-19 ENCOUNTER — Encounter (HOSPITAL_COMMUNITY): Payer: Self-pay | Admitting: Surgery

## 2023-05-19 DIAGNOSIS — K4090 Unilateral inguinal hernia, without obstruction or gangrene, not specified as recurrent: Secondary | ICD-10-CM | POA: Diagnosis not present

## 2023-05-19 LAB — GLUCOSE, CAPILLARY
Glucose-Capillary: 306 mg/dL — ABNORMAL HIGH (ref 70–99)
Glucose-Capillary: 346 mg/dL — ABNORMAL HIGH (ref 70–99)

## 2023-05-19 MED ORDER — OXYCODONE-ACETAMINOPHEN 5-325 MG PO TABS: 1.0000 | ORAL_TABLET | Freq: Four times a day (QID) | ORAL | 0 refills | Status: DC | PRN

## 2023-05-19 NOTE — Plan of Care (Signed)
Discharge instructions discussed with patient.  Patient instructed on home medications, restrictions, and follow up appointments. Belongings gathered and sent with patient.  Patients medications sent to [facility].  Patient discharged via wheelchair by this Clinical research associate.

## 2023-05-19 NOTE — Discharge Summary (Signed)
Physician Discharge Summary  Patient ID: Adam Jordan MRN: [id number] DOB/AGE: February 17, 1946 77 y.o.  Admit date: 05/18/2023 Discharge date: 05/19/2023  Admission Diagnoses:  Discharge Diagnoses:  Principal Problem:   S/P right inguinal hernia repair   Discharged Condition: good  Hospital Course: uneventful post op recovery.  Discharged home POD# 1  Consults: None  Significant Diagnostic Studies:   Treatments: surgery: open right inguinal hernia repair with mesh  Discharge Exam: Blood pressure 106/68, pulse 65, temperature 97.6 F (36.4 C), temperature source Oral, resp. rate (!) 22, height 5\' 7"  (1.702 m), weight 65.8 kg, SpO2 92%. General appearance: alert, cooperative, and no distress Resp: clear to auscultation bilaterally Cardio: regular rate and rhythm, S1, S2 normal, no murmur, click, rub or gallop Incision/Wound: incision clean, no hematoma  Disposition: Discharge disposition: 01-Home or Self Care        Allergies as of 05/19/2023       Reactions   Losartan Anaphylaxis   Atorvastatin Calcium    Causes agitation at 40 mg, tolerates 20 mg   Pollen Extract-tree Extract [pollen Extract]         Medication List     TAKE these medications    atorvastatin 20 MG tablet Commonly known as: LIPITOR Take 20 mg by mouth at bedtime.   blood glucose meter kit and supplies Dispense based on patient and insurance preference. Use up to four times daily as directed. (FOR ICD-10 E10.9, E11.9).   CLEAR EYES OP Place 1 drop into both eyes daily as needed (irritation).   clonazePAM 1 MG tablet Commonly known as: KLONOPIN Take 1 tablet (1 mg total) by mouth 2 (two) times daily as needed for anxiety. What changed: when to take this   Dexcom G7 Sensor Misc 1 Device by Does not apply route as directed.   Eliquis 5 MG Tabs tablet Generic drug: apixaban Take 1 tablet by mouth twice daily   fluticasone 50 MCG/ACT nasal spray Commonly known as: FLONASE Place 1  spray into both nostrils 2 (two) times daily as needed for allergies.   Gaviscon 80-14.2 MG Chew Generic drug: Alum Hydroxide-Mag Trisilicate Chew 1 tablet by mouth daily as needed (heartburn).   insulin glargine 100 UNIT/ML Solostar Pen Commonly known as: LANTUS Inject 24 Units into the skin daily.   Insulin Pen Needle 32G X 4 MM Misc 1 Device by Does not apply route in the morning, at noon, in the evening, and at bedtime.   meloxicam 15 MG tablet Commonly known as: MOBIC Take 15 mg by mouth daily.   multivitamin with minerals Tabs tablet Take 1 tablet by mouth daily.   nitroGLYCERIN 0.4 MG SL tablet Commonly known as: NITROSTAT Place 1 tablet (0.4 mg total) under the tongue every 5 (five) minutes as needed for chest pain.   NovoLOG FlexPen 100 UNIT/ML FlexPen Generic drug: insulin aspart Max daily 30 units What changed:  how much to take how to take this when to take this additional instructions   OneTouch Delica Plus Lancet33G Misc Apply 1 each topically 4 (four) times daily.   OneTouch Ultra test strip Generic drug: glucose blood 1 each by Other route 3 (three) times daily. for testing   OVER THE COUNTER MEDICATION Take 1-3 capsules by mouth at bedtime. Oxypowder magnesium citrate supplement   oxyCODONE-acetaminophen 5-325 MG tablet Commonly known as: PERCOCET/ROXICET Take 1 tablet by mouth every 6 (six) hours as needed for severe pain (pain score 7-10) or moderate pain (pain score 4-6). What changed:  how  much to take when to take this reasons to take this   ProAir HFA 108 (90 Base) MCG/ACT inhaler Generic drug: albuterol Inhale 2 puffs into the lungs every 6 (six) hours as needed for wheezing or shortness of breath.   sildenafil 20 MG tablet Commonly known as: REVATIO Take 20-60 mg by mouth daily as needed (ED).   triamcinolone cream 0.1 % Commonly known as: KENALOG Apply 1 Application topically daily as needed (rash).        Follow-up  Information     Abigail Miyamoto, MD. Schedule an appointment as soon as possible for a visit in 4 week(s).   Specialty: General Surgery Contact information: [address] [place] [address] [phone]                 Signed: Abigail Miyamoto 05/19/2023, 6:48 AM

## 2023-05-19 NOTE — Progress Notes (Signed)
Patient ID: Adam Jordan, male   DOB: Apr 28, 1946, 77 y.o.   MRN: [id number]   Doing well No overnight issues Plan d/c home today

## 2023-05-19 NOTE — Discharge Instructions (Signed)
[facility] _______[facility]  UMBILICAL OR INGUINAL HERNIA REPAIR: POST OP INSTRUCTIONS  Always review your discharge instruction sheet given to you by the facility where your surgery was performed. IF YOU HAVE DISABILITY OR FAMILY LEAVE FORMS, YOU MUST BRING THEM TO THE OFFICE FOR PROCESSING.   DO NOT GIVE THEM TO YOUR DOCTOR.  1. A  prescription for pain medication may be given to you upon discharge.  Take your pain medication as prescribed, if needed.  If narcotic pain medicine is not needed, then you may take acetaminophen (Tylenol) or ibuprofen (Advil) as needed. 2. Take your usually prescribed medications unless otherwise directed. If you need a refill on your pain medication, please contact your pharmacy.  They will contact our office to request authorization. Prescriptions will not be filled after 5 pm or on week-ends. 3. You should follow a light diet the first 24 hours after arrival home, such as soup and crackers, etc.  Be sure to include lots of fluids daily.  Resume your normal diet the day after surgery. 4.Most patients will experience some swelling and bruising around the umbilicus or in the groin and scrotum.  Ice packs and reclining will help.  Swelling and bruising can take several days to resolve.  6. It is common to experience some constipation if taking pain medication after surgery.  Increasing fluid intake and taking a stool softener (such as Colace) will usually help or prevent this problem from occurring.  A mild laxative (Milk of Magnesia or Miralax) should be taken according to package directions if there are no bowel movements after 48 hours. 7. Unless discharge instructions indicate otherwise, you may remove your bandages 24-48 hours after surgery, and you may shower at that time.  You may have steri-strips (small skin tapes) in place directly over the incision.  These strips should be left on the skin for 7-10 days.  If your surgeon used skin glue on the  incision, you may shower in 24 hours.  The glue will flake off over the next 2-3 weeks.  Any sutures or staples will be removed at the office during your follow-up visit. 8. ACTIVITIES:  You may resume regular (light) daily activities beginning the next day--such as daily self-care, walking, climbing stairs--gradually increasing activities as tolerated.  You may have sexual intercourse when it is comfortable.  Refrain from any heavy lifting or straining until approved by your doctor.  a.You may drive when you are no longer taking prescription pain medication, you can comfortably wear a seatbelt, and you can safely maneuver your car and apply brakes. b.RETURN TO WORK:   _____________________________________________  9.You should see your doctor in the office for a follow-up appointment approximately 2-3 weeks after your surgery.  Make sure that you call for this appointment within a day or two after you arrive home to insure a convenient appointment time. 10.OTHER INSTRUCTIONS: __YOU MAY SHOWER STARTING TODAY NO LIFTING MORE THAN 15 POUNDS FOR 4 WEEK _______________________    _____________________________________  WHEN TO CALL YOUR DOCTOR: Fever over 101.0 Inability to urinate Nausea and/or vomiting Extreme swelling or bruising Continued bleeding from incision. Increased pain, redness, or drainage from the incision  The clinic staff is available to answer your questions during regular business hours.  Please don't hesitate to call and ask to speak to one of the nurses for clinical concerns.  If you have a medical emergency, go to the nearest emergency room or call 911.  A surgeon from [facility] is  always on call at the hospital   [address], [address], [place], [place]  [address] ?  [address] [address], [place], [place]   [address] [phone] ? [phone] ? FAX [phone] Web site: [email or web address]

## 2023-05-20 NOTE — Anesthesia Postprocedure Evaluation (Signed)
Anesthesia Post Note  Patient: Adam Jordan  Procedure(s) Performed: OPEN RIGHT INGUINAL HERNIA REPAIR WITH MESH (Right: Groin) INSERTION OF MESH (Right: Groin)     Patient location during evaluation: PACU Anesthesia Type: Regional and General Level of consciousness: sedated and patient cooperative Pain management: pain level controlled Vital Signs Assessment: post-procedure vital signs reviewed and stable Respiratory status: spontaneous breathing Cardiovascular status: stable Anesthetic complications: no   No notable events documented.  Last Vitals:  Vitals:   05/19/23 0423 05/19/23 0900  BP: 106/68 (!) 111/57  Pulse: 65 63  Resp: (!) 22 17  Temp: 36.4 C 36.8 C  SpO2: 92% 94%    Last Pain:  Vitals:   05/19/23 0423  TempSrc: Oral  PainSc:                  Lewie Loron

## 2023-05-22 NOTE — Plan of Care (Signed)
[facility] Tonsillectomy/Adenoidectomy, Postoperative PEDS care plan entered in error.

## 2023-05-23 ENCOUNTER — Ambulatory Visit: Payer: Medicare HMO | Admitting: Cardiology

## 2023-05-24 ENCOUNTER — Other Ambulatory Visit: Payer: Self-pay | Admitting: Internal Medicine

## 2023-05-24 ENCOUNTER — Telehealth: Payer: Self-pay

## 2023-05-24 DIAGNOSIS — G893 Neoplasm related pain (acute) (chronic): Secondary | ICD-10-CM

## 2023-05-24 MED ORDER — OXYCODONE-ACETAMINOPHEN 5-325 MG PO TABS: 1.0000 | ORAL_TABLET | Freq: Four times a day (QID) | ORAL | 0 refills | Status: DC | PRN

## 2023-05-24 NOTE — Telephone Encounter (Signed)
Pt called for medication refill on Oxycodone.

## 2023-05-27 ENCOUNTER — Telehealth: Payer: Self-pay | Admitting: Medical Oncology

## 2023-05-27 DIAGNOSIS — G893 Neoplasm related pain (acute) (chronic): Secondary | ICD-10-CM

## 2023-05-27 DIAGNOSIS — C3431 Malignant neoplasm of lower lobe, right bronchus or lung: Secondary | ICD-10-CM

## 2023-05-27 NOTE — Telephone Encounter (Signed)
Pt called and requested referral to hospice - I LVM with number for pt to call and I called in referral.

## 2023-06-15 DIAGNOSIS — E109 Type 1 diabetes mellitus without complications: Secondary | ICD-10-CM | POA: Diagnosis not present

## 2023-06-20 ENCOUNTER — Telehealth: Payer: Self-pay | Admitting: Medical Oncology

## 2023-06-20 NOTE — Telephone Encounter (Signed)
LVM to see how Adam Jordan' is doing .

## 2023-06-23 ENCOUNTER — Telehealth: Payer: Self-pay | Admitting: Medical Oncology

## 2023-07-03 NOTE — Telephone Encounter (Signed)
I called Hospice and was told Adam Jordan died today.

## 2023-07-03 DEATH — deceased

## 2023-10-17 ENCOUNTER — Ambulatory Visit: Payer: Medicare HMO | Admitting: Internal Medicine

## 2024-07-10 ENCOUNTER — Encounter (HOSPITAL_COMMUNITY): Payer: Self-pay | Admitting: General Surgery
